# Patient Record
Sex: Female | Born: 1945 | Race: White | Hispanic: No | Smoking: Never smoker
Health system: Southern US, Community
[De-identification: ages and names within clinical notes are randomized; demographics above are authoritative.]

## PROBLEM LIST (undated history)

## (undated) DIAGNOSIS — I471 Supraventricular tachycardia, unspecified: Secondary | ICD-10-CM

## (undated) DIAGNOSIS — E119 Type 2 diabetes mellitus without complications: Secondary | ICD-10-CM

## (undated) DIAGNOSIS — M1990 Unspecified osteoarthritis, unspecified site: Secondary | ICD-10-CM

## (undated) DIAGNOSIS — E039 Hypothyroidism, unspecified: Secondary | ICD-10-CM

## (undated) DIAGNOSIS — K635 Polyp of colon: Secondary | ICD-10-CM

## (undated) DIAGNOSIS — E210 Primary hyperparathyroidism: Secondary | ICD-10-CM

## (undated) DIAGNOSIS — E079 Disorder of thyroid, unspecified: Secondary | ICD-10-CM

## (undated) DIAGNOSIS — M51369 Other intervertebral disc degeneration, lumbar region without mention of lumbar back pain or lower extremity pain: Secondary | ICD-10-CM

## (undated) DIAGNOSIS — M5136 Other intervertebral disc degeneration, lumbar region: Secondary | ICD-10-CM

## (undated) DIAGNOSIS — M5126 Other intervertebral disc displacement, lumbar region: Secondary | ICD-10-CM

## (undated) DIAGNOSIS — E785 Hyperlipidemia, unspecified: Secondary | ICD-10-CM

## (undated) DIAGNOSIS — R351 Nocturia: Secondary | ICD-10-CM

## (undated) DIAGNOSIS — K589 Irritable bowel syndrome without diarrhea: Secondary | ICD-10-CM

## (undated) DIAGNOSIS — T8859XA Other complications of anesthesia, initial encounter: Secondary | ICD-10-CM

## (undated) DIAGNOSIS — N289 Disorder of kidney and ureter, unspecified: Secondary | ICD-10-CM

## (undated) DIAGNOSIS — N209 Urinary calculus, unspecified: Secondary | ICD-10-CM

## (undated) DIAGNOSIS — I10 Essential (primary) hypertension: Secondary | ICD-10-CM

## (undated) DIAGNOSIS — J302 Other seasonal allergic rhinitis: Secondary | ICD-10-CM

## (undated) DIAGNOSIS — R011 Cardiac murmur, unspecified: Secondary | ICD-10-CM

## (undated) DIAGNOSIS — Z8739 Personal history of other diseases of the musculoskeletal system and connective tissue: Secondary | ICD-10-CM

## (undated) HISTORY — DX: Essential (primary) hypertension: I10

## (undated) HISTORY — DX: Irritable bowel syndrome, unspecified: K58.9

## (undated) HISTORY — DX: Unspecified osteoarthritis, unspecified site: M19.90

## (undated) HISTORY — DX: Hyperlipidemia, unspecified: E78.5

## (undated) HISTORY — PX: WISDOM TOOTH EXTRACTION: SHX21

## (undated) HISTORY — DX: Disorder of thyroid, unspecified: E07.9

## (undated) HISTORY — DX: Urinary calculus, unspecified: N20.9

## (undated) SURGERY — CYSTOSCOPY, WITH STENT INSERTION
Anesthesia: Choice | Laterality: Left

---
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
## 1984-09-18 HISTORY — PX: OTHER SURGICAL HISTORY: SHX169

## 1984-09-18 HISTORY — PX: ABDOMINAL HYSTERECTOMY: SHX81

## 1995-06-19 HISTORY — PX: LITHOTRIPSY: SUR834

## 1997-06-18 HISTORY — PX: TUMOR REMOVAL: SHX12

## 1999-01-17 HISTORY — PX: CHOLECYSTECTOMY: SHX55

## 1999-11-18 ENCOUNTER — Encounter: Payer: Self-pay | Admitting: *Deleted

## 1999-11-19 ENCOUNTER — Inpatient Hospital Stay (HOSPITAL_COMMUNITY): Admission: EM | Admit: 1999-11-19 | Discharge: 1999-11-23 | Payer: Self-pay

## 1999-11-19 ENCOUNTER — Encounter: Payer: Self-pay | Admitting: *Deleted

## 2000-09-18 HISTORY — PX: TUMOR REMOVAL: SHX12

## 2004-07-13 ENCOUNTER — Ambulatory Visit (HOSPITAL_COMMUNITY): Admission: RE | Admit: 2004-07-13 | Discharge: 2004-07-13 | Payer: Self-pay | Admitting: Podiatry

## 2004-07-25 ENCOUNTER — Encounter: Admission: RE | Admit: 2004-07-25 | Discharge: 2004-07-25 | Payer: Self-pay | Admitting: Urology

## 2004-07-25 IMAGING — CR DG CHEST 2V
2 series · 2 of 2 positions shown · non-contrast
Comparison: none

CLINICAL DATA: Hypertension.  Renal calculi.  Pre-operative chest. 
 TWO VIEW CHEST: 
 Lungs are clear and the heart and mediastinal structures are normal.

[view not recorded (1 of 2)]
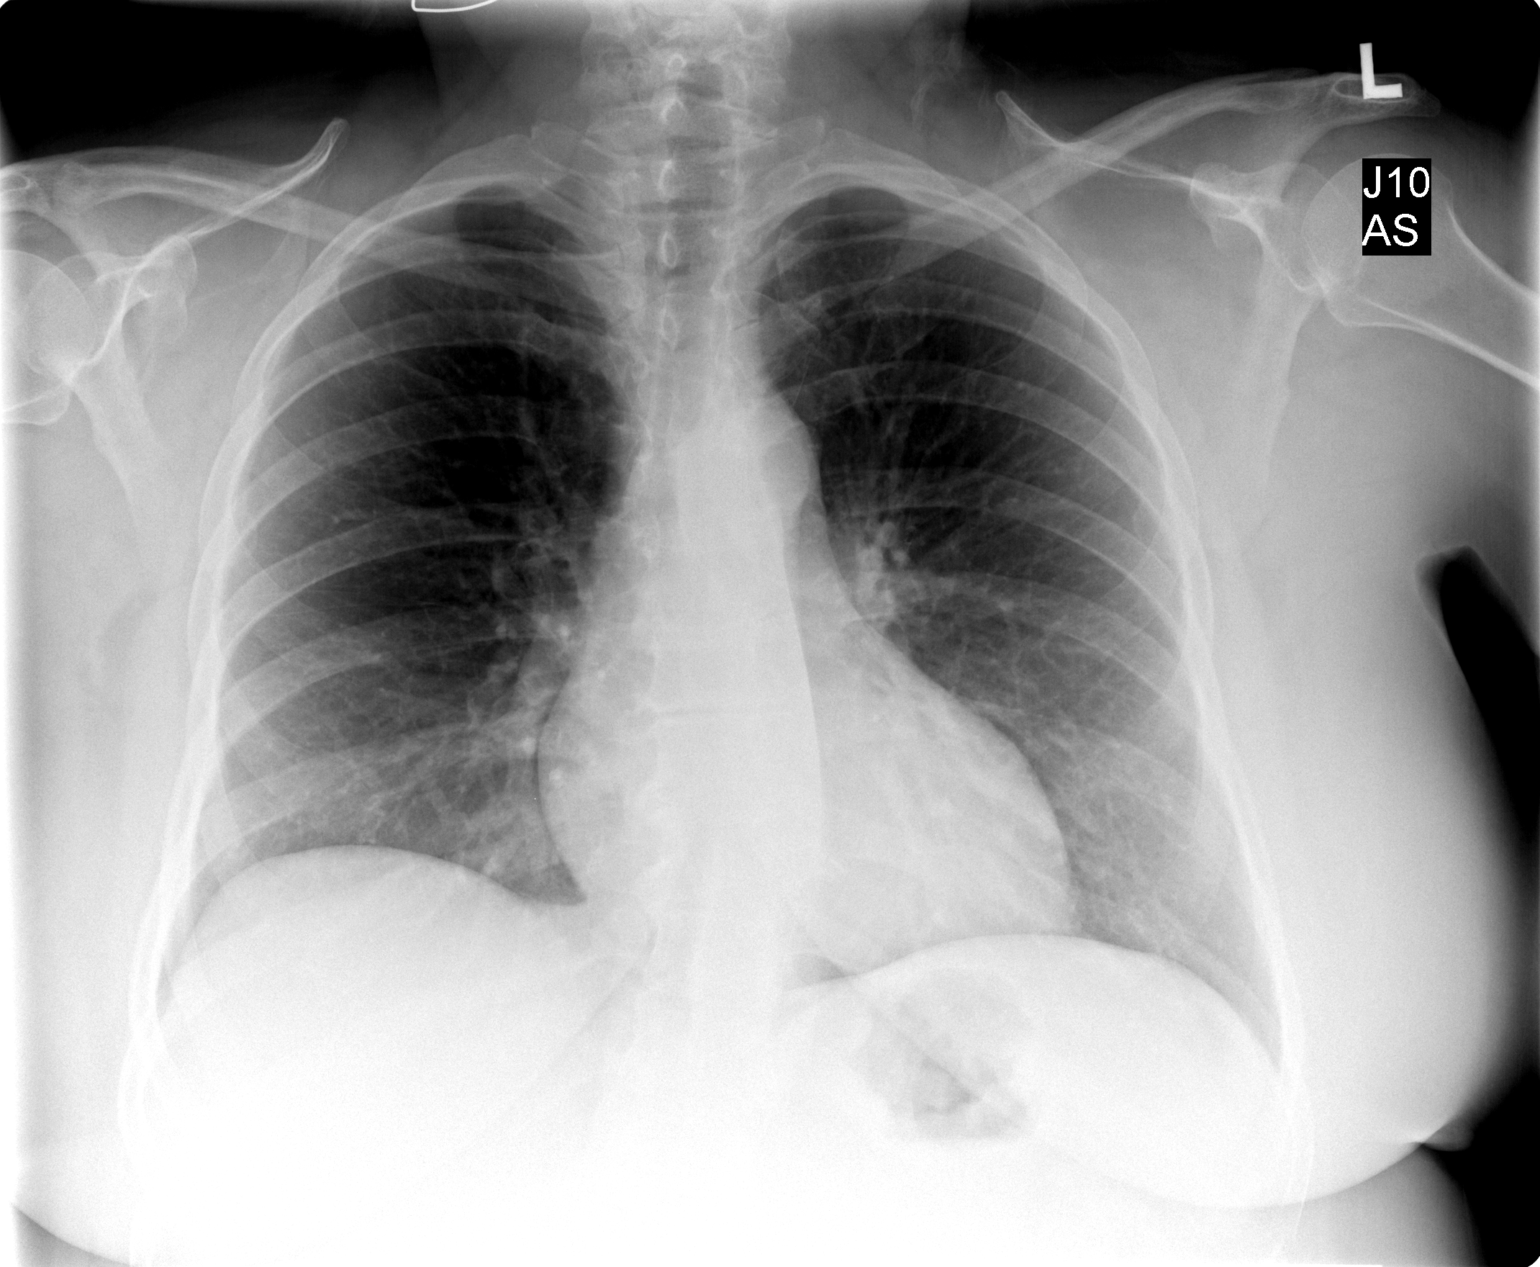

[view not recorded (2 of 2)]
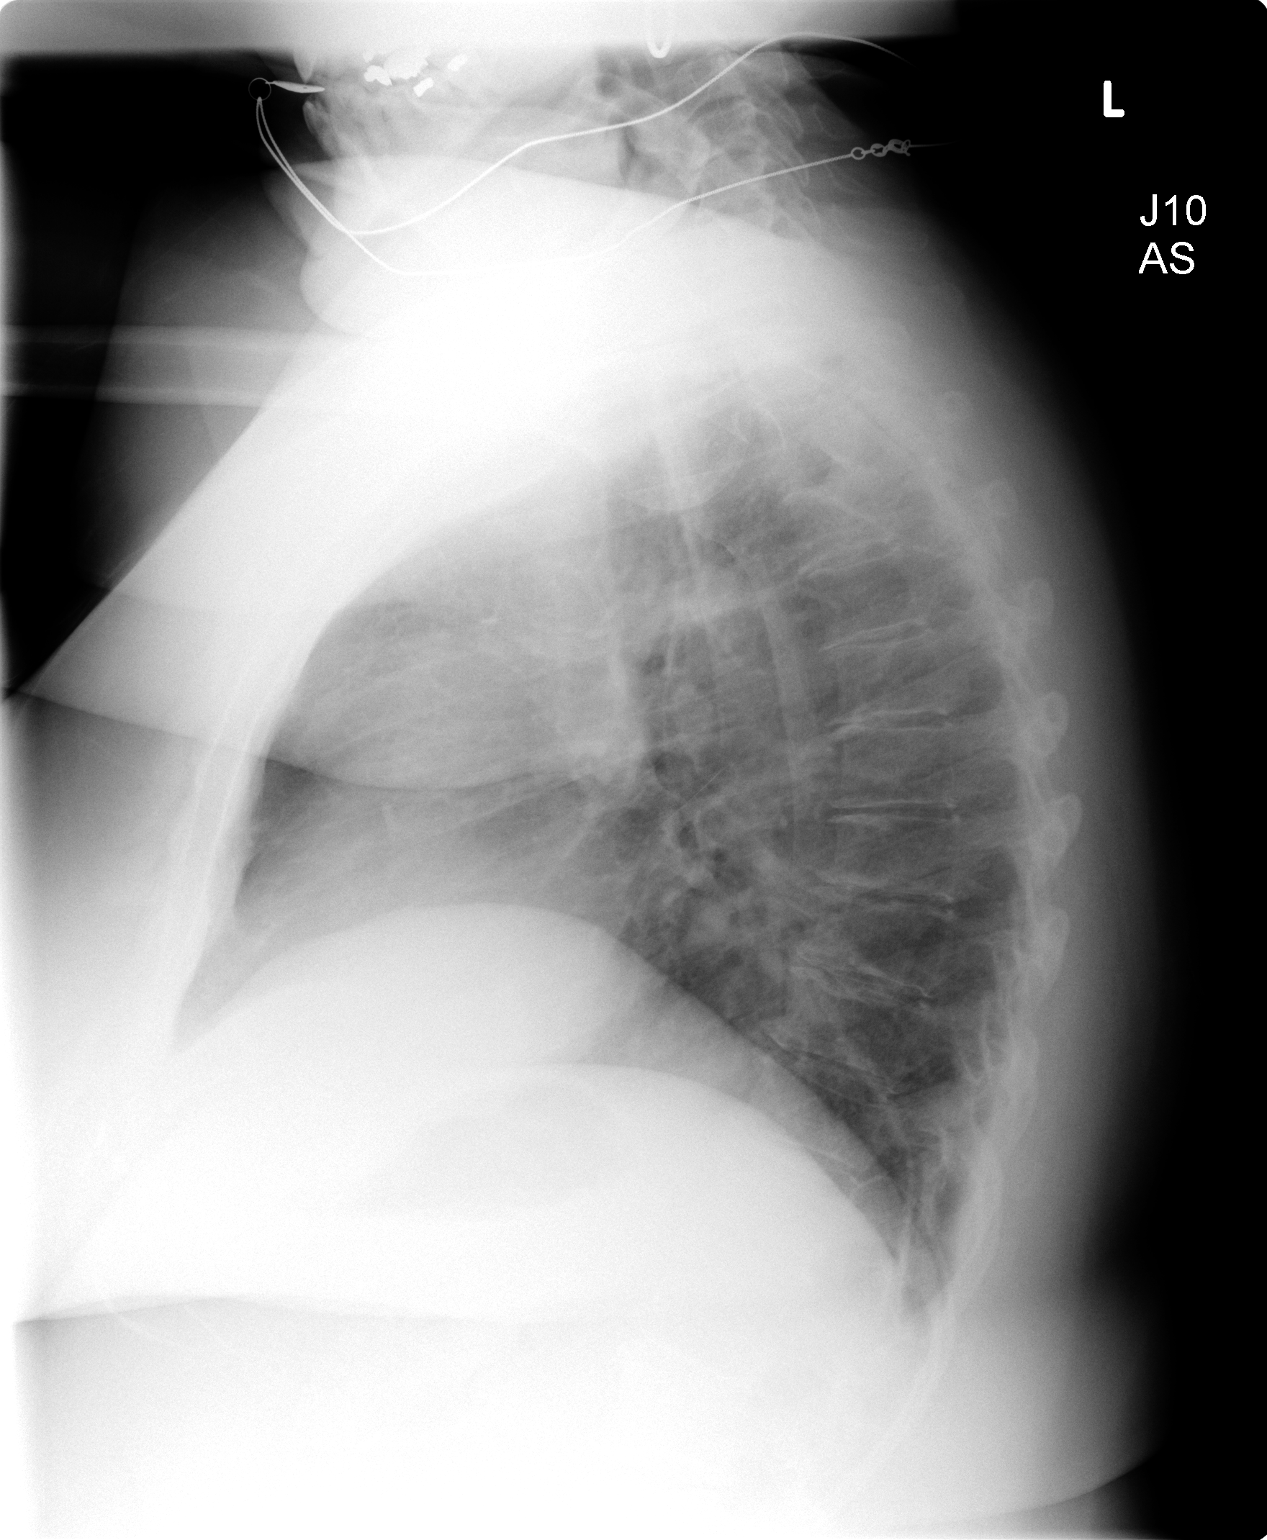

[2 of 2 positions shown; findings below may reference images not displayed]

IMPRESSION: No evidence for active chest disease.

## 2004-07-27 ENCOUNTER — Ambulatory Visit (HOSPITAL_BASED_OUTPATIENT_CLINIC_OR_DEPARTMENT_OTHER): Admission: RE | Admit: 2004-07-27 | Discharge: 2004-07-27 | Payer: Self-pay | Admitting: Urology

## 2004-07-27 ENCOUNTER — Ambulatory Visit (HOSPITAL_COMMUNITY): Admission: RE | Admit: 2004-07-27 | Discharge: 2004-07-27 | Payer: Self-pay | Admitting: Urology

## 2004-07-27 IMAGING — CR DG ABDOMEN 1V
2 series · 2 of 2 positions shown · non-contrast
Comparison: none

CLINICAL DATA: Left renal calculi preop. 
 KUB ? [DATE] at [ST] hours
 At least three calcific densities project over the left renal shadow.  The largest is in the upper pole measuring 9 mm in greater dimension.  Two smaller calcific densities are seen in the lower pole of the left kidney.   Surgical staples project over the right side of the abdomen.  Probable phleboliths project over the pelvis. The bowel gas pattern is unremarkable.  
 IMPRESSION
 Left nephrolithiasis.

[view not recorded (1 of 2)]
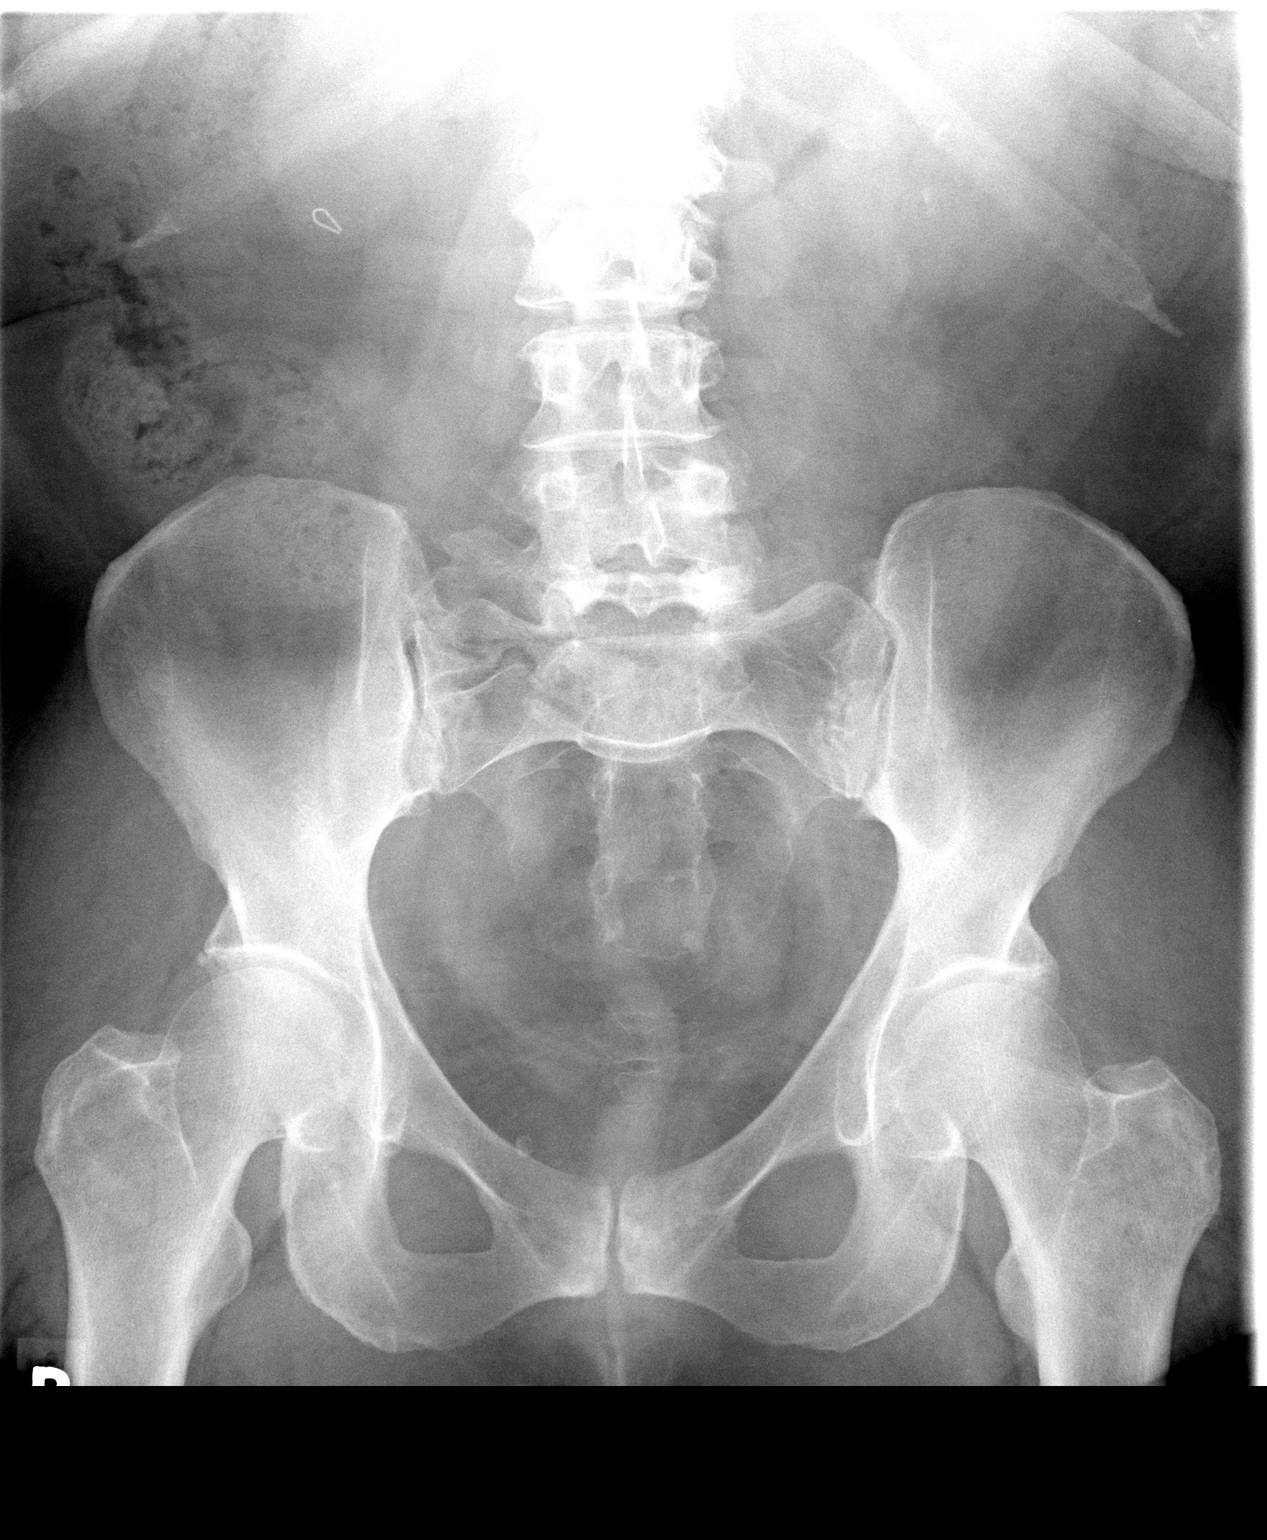

[view not recorded (2 of 2)]
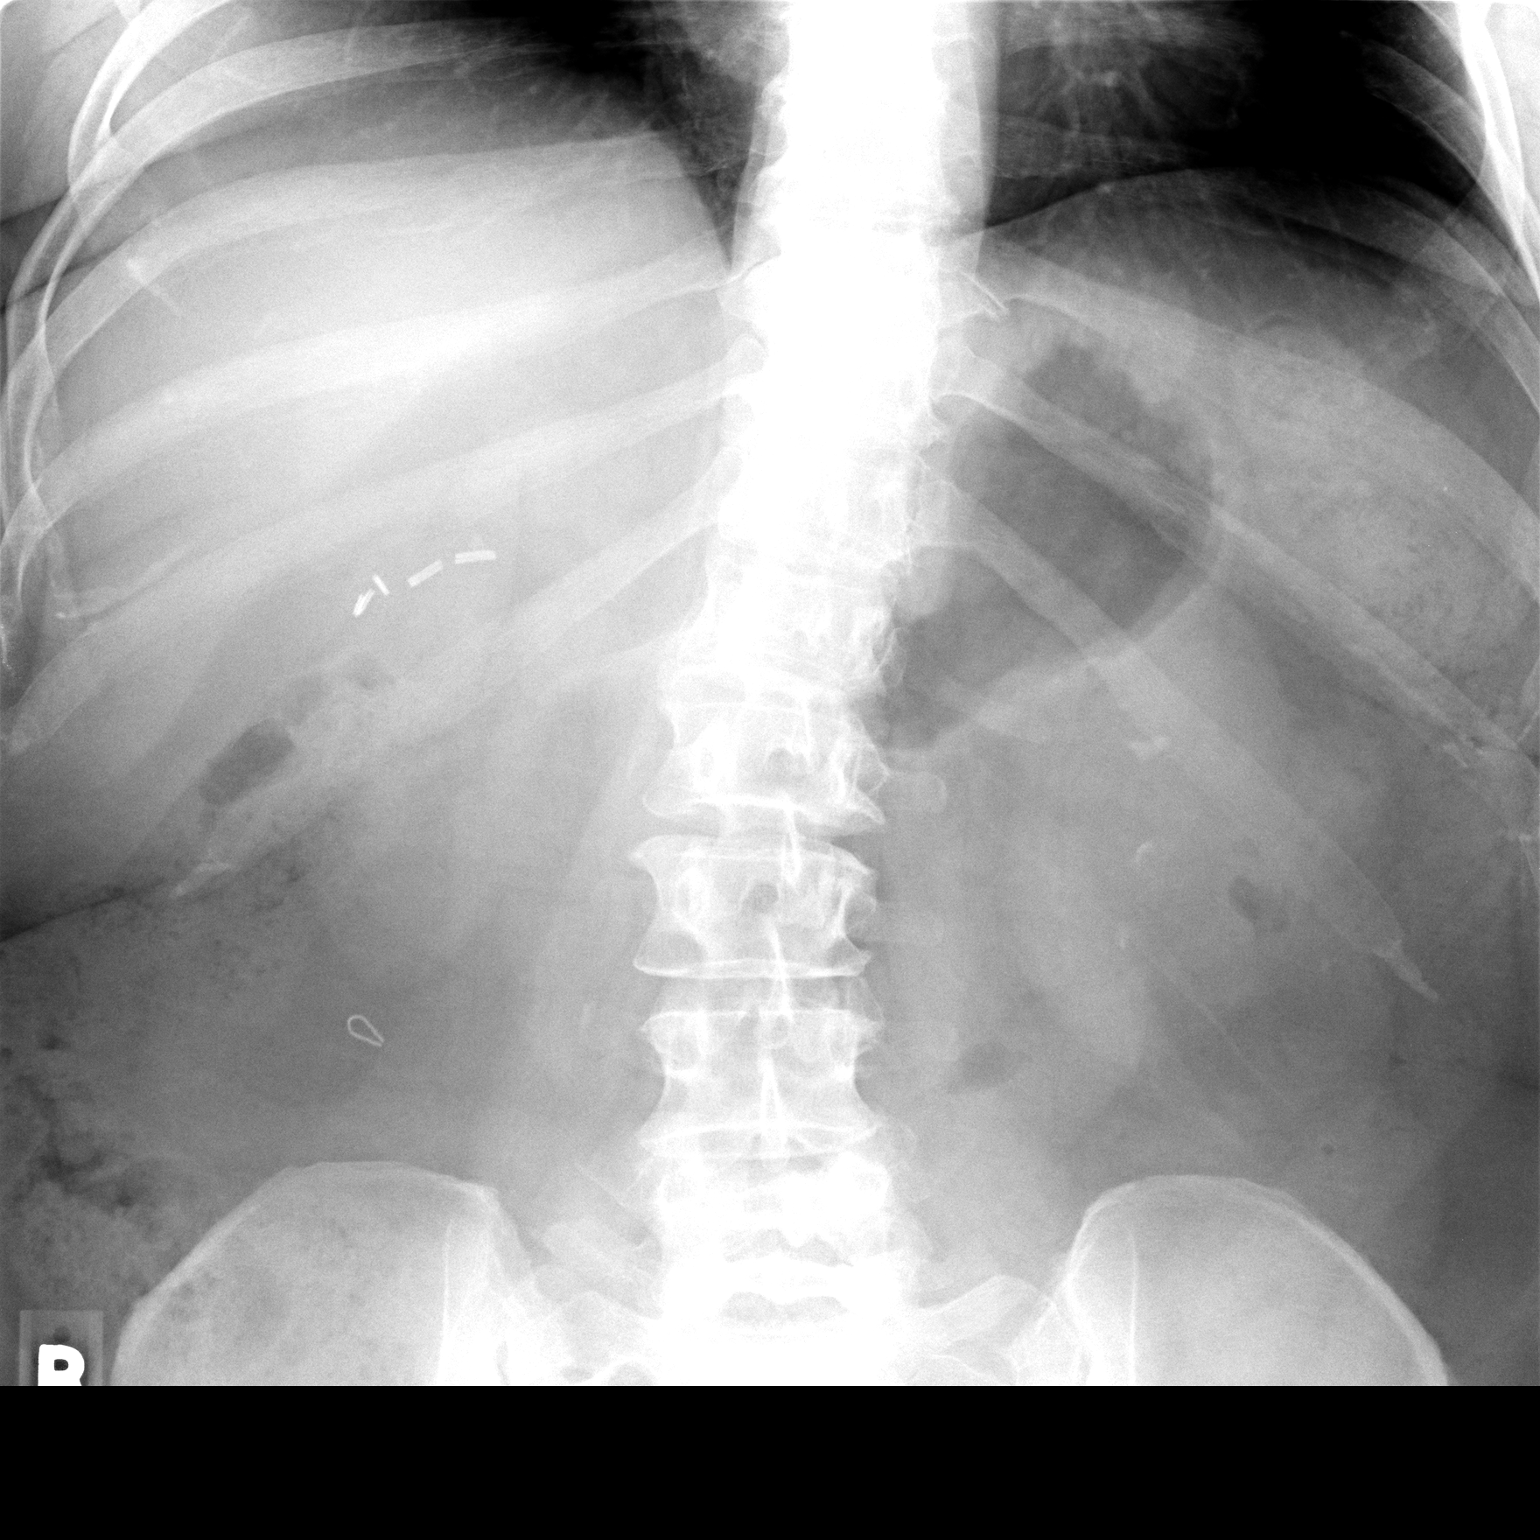

[2 of 2 positions shown; findings below may reference images not displayed]

## 2006-03-16 ENCOUNTER — Ambulatory Visit: Payer: Self-pay | Admitting: Internal Medicine

## 2006-03-30 ENCOUNTER — Ambulatory Visit: Payer: Self-pay | Admitting: Internal Medicine

## 2006-03-30 ENCOUNTER — Ambulatory Visit (HOSPITAL_COMMUNITY): Admission: RE | Admit: 2006-03-30 | Discharge: 2006-03-30 | Payer: Self-pay | Admitting: Internal Medicine

## 2006-06-18 HISTORY — PX: COLONOSCOPY: SHX174

## 2006-12-31 ENCOUNTER — Ambulatory Visit: Payer: Self-pay | Admitting: Cardiology

## 2007-01-01 ENCOUNTER — Observation Stay (HOSPITAL_COMMUNITY): Admission: AD | Admit: 2007-01-01 | Discharge: 2007-01-02 | Payer: Self-pay | Admitting: Cardiology

## 2007-01-01 ENCOUNTER — Ambulatory Visit: Payer: Self-pay | Admitting: Cardiology

## 2007-01-11 ENCOUNTER — Ambulatory Visit: Payer: Self-pay | Admitting: Cardiology

## 2009-09-18 HISTORY — PX: OTHER SURGICAL HISTORY: SHX169

## 2010-10-08 ENCOUNTER — Encounter: Payer: Self-pay | Admitting: Urology

## 2010-12-18 HISTORY — PX: KNEE ARTHROSCOPY: SUR90

## 2011-03-31 ENCOUNTER — Encounter (INDEPENDENT_AMBULATORY_CARE_PROVIDER_SITE_OTHER): Payer: Self-pay | Admitting: Internal Medicine

## 2011-04-25 ENCOUNTER — Telehealth (INDEPENDENT_AMBULATORY_CARE_PROVIDER_SITE_OTHER): Payer: Self-pay | Admitting: *Deleted

## 2011-04-25 DIAGNOSIS — Z86010 Personal history of colonic polyps: Secondary | ICD-10-CM

## 2011-04-25 NOTE — Telephone Encounter (Signed)
TCS sch'd 07/12/11 @ 10:30 (9:30), movi prep ins mailed

## 2011-05-01 MED ORDER — PEG-KCL-NACL-NASULF-NA ASC-C 100 G PO SOLR: 1.0000 | Freq: Once | ORAL | Status: DC

## 2011-06-19 ENCOUNTER — Encounter (INDEPENDENT_AMBULATORY_CARE_PROVIDER_SITE_OTHER): Payer: Self-pay | Admitting: Internal Medicine

## 2011-06-19 ENCOUNTER — Ambulatory Visit (INDEPENDENT_AMBULATORY_CARE_PROVIDER_SITE_OTHER): Payer: BC Managed Care – PPO | Admitting: Internal Medicine

## 2011-06-19 VITALS — BP 128/76 | HR 74 | Temp 97.7°F | Resp 12 | Ht 63.0 in | Wt 227.0 lb

## 2011-06-19 DIAGNOSIS — K589 Irritable bowel syndrome without diarrhea: Secondary | ICD-10-CM

## 2011-06-19 MED ORDER — DICYCLOMINE HCL 10 MG PO CAPS: 10.0000 mg | ORAL_CAPSULE | Freq: Two times a day (BID) | ORAL | Status: DC

## 2011-06-19 NOTE — Patient Instructions (Addendum)
Increase fiber intake. dicylcomine 10 mg twice daily before breakfast and before lunch Colonoscopy as planned

## 2011-06-20 NOTE — Progress Notes (Signed)
Presenting complaint; intermittent diarrhea. History of present illness; patient is 65 year old Caucasian female who presents with complaints of intermittent diarrhea that she states she's been putting off for several years. I do not remember this is an issue when she had her last colonoscopy 5 years ago. She is due for surveillance colonoscopy later this month because of history of polyps;  He states she's had intermittent diarrhea for several years. When she has diarrhea she may have 4-5 stools a day. He could go from having a normal stool in the morning followed by loose stool with urgency is here with severe pain across her lower abdomen relieved with defecation. She she's been having these spells at least 2-3 times a week. Other times her stools are normal. Only time she gets constipated she eats too much cheese. She denies rectal bleeding melena anorexia or weight loss. She's never been treated or diagnosed with irritable bowel syndrome. She denies nocturnal diarrhea. Review of the systems is negative for fever, chills, skin rash or wheezing. Current medications Current outpatient prescriptions:fluticasone (FLONASE) 50 MCG/ACT nasal spray, Place 2 sprays into the nose as needed. , Disp: , Rfl: ;  HYDROcodone-acetaminophen (NORCO) 7.5-325 MG per tablet, Take 1 tablet by mouth. Patient takes as needed for kidney stones, Disp: , Rfl: ;  ibuprofen (ADVIL,MOTRIN) 800 MG tablet, Take 800 mg by mouth as needed.  , Disp: , Rfl:  levothyroxine (SYNTHROID, LEVOTHROID) 50 MCG tablet, Take 50 mcg by mouth daily.  , Disp: , Rfl: ;  losartan-hydrochlorothiazide (HYZAAR) 100-25 MG per tablet, Take 1 tablet by mouth daily.  , Disp: , Rfl: ;  metoprolol (LOPRESSOR) 50 MG tablet, Take 50 mg by mouth daily.  , Disp: , Rfl: ;  dicyclomine (BENTYL) 10 MG capsule, Take 1 capsule (10 mg total) by mouth 2 (two) times daily., Disp: 60 capsule, Rfl: 5 peg 3350 powder (MOVIPREP) 100 G SOLR, Take 1 kit (100 g total) by mouth  once., Disp: 1 kit, Rfl: 0 Past medical history History of colonic adenomas; first colonoscopy was in February 2003 with removal of 2 small tubular adenomas. Her last colonoscopy was in July 2007 with removal of 8 mm tubulovillous adenoma and transverse colon She has  been hypertensive for about 15 years Hypothyroidism was diagnosed 4 years ago Appendectomy 1954 Hysterectomy and tacking of the bladder in 1986 Obesity Cholecystectomy in May 2000 Osteoarthrosis of knees status post left knee arthroscopy in May 2007 Surgery for right rotator cuff tear in October 2011 Allergies NKA  Family history mother lived to be 16. Father died of MI age 69. She has one brother in good health. Social history She is married. She stayed home the taking care of her mother and now assist in the care of her mother-in-law. She has 2 daughters in good health. He is never smoked cigarettes or drank alcohol per Objective BP 128/76  Pulse 74  Temp(Src) 97.7 F (36.5 C) (Oral)  Resp 12  Ht 5\' 3"  (1.6 m)  Wt 227 lb (102.967 kg)  BMI 40.21 kg/m2 Conjunctiva is pink. Sclerae nonicteric. Oropharyngeal mucosa is normal No neck masses or thyromegaly noted. Cardiac exam with regular rhythm normal S1 and S2. No murmur gallop noted Lungs are clear to auscultation Abdomen is full; bowel sounds are normal. On palpation soft abdomen without tenderness organomegaly or masses. Rectal examination deferred. No peripheral edema or clubbing noted Assessment Patient's symptoms are felt to be typical of irritable bowel syndrome. She is due for surveillance colonoscopy because of history  of colonic adenomas; this study would also allow [place] to rule out  low-grade colitis or colonic stricture. In the meantime we'll treat her with low-dose anti-spasmodic Recommendations. Dicyclomine 10 mg before breakfast and 10 mg before lunch. Social side effects of medication reviewed with the patient. Colonoscopy as planned for later this  month.

## 2011-07-04 ENCOUNTER — Other Ambulatory Visit (INDEPENDENT_AMBULATORY_CARE_PROVIDER_SITE_OTHER): Payer: Self-pay | Admitting: *Deleted

## 2011-07-04 DIAGNOSIS — Z86010 Personal history of colonic polyps: Secondary | ICD-10-CM

## 2011-07-11 MED ORDER — SODIUM CHLORIDE 0.45 % IV SOLN
Freq: Once | INTRAVENOUS | Status: AC
  Administered 2011-07-12: 11:00:00 via INTRAVENOUS

## 2011-07-12 ENCOUNTER — Ambulatory Visit (HOSPITAL_COMMUNITY)
Admission: RE | Admit: 2011-07-12 | Discharge: 2011-07-12 | Disposition: A | Discharge status: Home / Self Care | Payer: BC Managed Care – PPO | Source: Ambulatory Visit | Attending: Internal Medicine | Admitting: Internal Medicine

## 2011-07-12 ENCOUNTER — Other Ambulatory Visit (INDEPENDENT_AMBULATORY_CARE_PROVIDER_SITE_OTHER): Payer: Self-pay | Admitting: Internal Medicine

## 2011-07-12 ENCOUNTER — Encounter (HOSPITAL_COMMUNITY)
Admission: RE | Disposition: A | Discharge status: Home / Self Care | Payer: Self-pay | Source: Ambulatory Visit | Attending: Internal Medicine

## 2011-07-12 ENCOUNTER — Encounter (HOSPITAL_COMMUNITY): Payer: Self-pay | Admitting: *Deleted

## 2011-07-12 DIAGNOSIS — K5730 Diverticulosis of large intestine without perforation or abscess without bleeding: Secondary | ICD-10-CM

## 2011-07-12 DIAGNOSIS — D126 Benign neoplasm of colon, unspecified: Secondary | ICD-10-CM | POA: Insufficient documentation

## 2011-07-12 DIAGNOSIS — Z09 Encounter for follow-up examination after completed treatment for conditions other than malignant neoplasm: Secondary | ICD-10-CM | POA: Insufficient documentation

## 2011-07-12 DIAGNOSIS — Z860100 Personal history of colon polyps, unspecified: Secondary | ICD-10-CM | POA: Insufficient documentation

## 2011-07-12 DIAGNOSIS — Z79899 Other long term (current) drug therapy: Secondary | ICD-10-CM | POA: Insufficient documentation

## 2011-07-12 DIAGNOSIS — Z1211 Encounter for screening for malignant neoplasm of colon: Secondary | ICD-10-CM

## 2011-07-12 DIAGNOSIS — I10 Essential (primary) hypertension: Secondary | ICD-10-CM | POA: Insufficient documentation

## 2011-07-12 HISTORY — PX: COLONOSCOPY: SHX5424

## 2011-07-12 HISTORY — DX: Polyp of colon: K63.5

## 2011-07-12 HISTORY — DX: Other seasonal allergic rhinitis: J30.2

## 2011-07-12 HISTORY — DX: Hypothyroidism, unspecified: E03.9

## 2011-07-12 SURGERY — COLONOSCOPY
Anesthesia: Moderate Sedation

## 2011-07-12 MED ORDER — STERILE WATER FOR IRRIGATION IR SOLN
Status: DC | PRN
  Administered 2011-07-12: 11:00:00

## 2011-07-12 MED ORDER — MIDAZOLAM HCL 5 MG/5ML IJ SOLN
INTRAMUSCULAR | Status: AC
  Filled 2011-07-12: qty 10

## 2011-07-12 MED ORDER — MEPERIDINE HCL 50 MG/ML IJ SOLN
INTRAMUSCULAR | Status: DC | PRN
  Administered 2011-07-12 (×2): 25 mg via INTRAVENOUS

## 2011-07-12 MED ORDER — MIDAZOLAM HCL 5 MG/5ML IJ SOLN
INTRAMUSCULAR | Status: DC | PRN
  Administered 2011-07-12: 2 mg via INTRAVENOUS
  Administered 2011-07-12 (×2): 1 mg via INTRAVENOUS
  Administered 2011-07-12: 2 mg via INTRAVENOUS
  Administered 2011-07-12: 1 mg via INTRAVENOUS
  Administered 2011-07-12: 2 mg via INTRAVENOUS
  Administered 2011-07-12: 1 mg via INTRAVENOUS

## 2011-07-12 MED ORDER — MEPERIDINE HCL 50 MG/ML IJ SOLN
INTRAMUSCULAR | Status: AC
  Filled 2011-07-12: qty 1

## 2011-07-12 MED ORDER — SODIUM CHLORIDE 0.9 % IJ SOLN
INTRAMUSCULAR | Status: DC | PRN
  Administered 2011-07-12: 15 mL

## 2011-07-12 NOTE — H&P (Addendum)
This is an update to my history and physical from 06/19/2011. Patient states her diarrhea is better with single dose of dicyclomine 10 mg daily. He is undergoing colonoscopy surveillance purposes cause of history of colonic adenomas

## 2011-07-12 NOTE — Op Note (Signed)
COLONOSCOPY PROCEDURE REPORT  PATIENT:  Natasha Warren  MR#:  [id number] Birthdate:  11-12-1945, 65 y.o., female Endoscopist:  Dr. Rogene Houston, MD Referred By:  Dr. Thomasene Lot. Nadara Mustard, M.D. Procedure Date: 07/12/2011  Procedure:   Colonoscopy with snare polypectomy.  Indications:  Patient is 65 year old female with history of colonic adenomas who is undergoing surveillance colonoscopy. She has intermittent diarrhea felt to be due to IBS and she is feeling better with low-dose dicyclomine.  Informed Consent:  Procedure and risks reviewed with the patient and informed consent was obtained.  Medications:  Demerol 50 mg IV Versed 10 mg IV  Description of procedure:  After a digital rectal exam was performed, that colonoscope was advanced from the anus through the rectum and colon to the area of the cecum, ileocecal valve and appendiceal orifice. The cecum was deeply intubated. These structures were well-seen and photographed for the record. From the level of the cecum and ileocecal valve, the scope was slowly and cautiously withdrawn. The mucosal surfaces were carefully surveyed utilizing scope tip to flexion to facilitate fold flattening as needed. The scope was pulled down into the rectum where a thorough exam including retroflexion was performed.  Findings:   Preparation satisfactory. 12-13 mm sessile polyp at cecum. Saline-assisted polypectomy performed. Polyp snared in 2 pieces; larger piece was retrieved for histologic examination and smaller piece was lost. 2 hemoclips applied to polypectomy site to reduce the risk of post-polypectomy bleed. One clip was lost possibly defective. Another 3 mm polyp ablated via cold biopsy from proximal transverse colon. Few diverticula at sigmoid colon. Mucosa at the rectum and anorectal junction was unremarkable.  Therapeutic/Diagnostic Maneuvers Performed:  See above  Complications:  None  Cecal Withdrawal Time:  20 minutes  Impression:    Examination performed to cecum. 12-13 mm sessile cecal polyp. Saline-assisted polypectomy performed. Polypectomy complete. 2 hemoclips applied to polypectomy site. 3 mm polyp ablated via cold biopsy from proximal transverse colon. Mild sigmoid colon diverticulosis.  Recommendations:  No aspirin for 10 days I will be contacting patient with results of biopsy.  Jayme Cham U  07/12/2011 12:18 PM  CC: Dr. Hilaria Ota, MD & Dr. Rayne Du ref. provider found

## 2011-07-19 ENCOUNTER — Encounter (HOSPITAL_COMMUNITY): Payer: Self-pay | Admitting: Internal Medicine

## 2011-07-21 ENCOUNTER — Encounter (INDEPENDENT_AMBULATORY_CARE_PROVIDER_SITE_OTHER): Payer: Self-pay | Admitting: *Deleted

## 2011-09-26 DIAGNOSIS — I10 Essential (primary) hypertension: Secondary | ICD-10-CM | POA: Diagnosis not present

## 2011-09-26 DIAGNOSIS — E7800 Pure hypercholesterolemia, unspecified: Secondary | ICD-10-CM | POA: Diagnosis not present

## 2011-09-26 DIAGNOSIS — N200 Calculus of kidney: Secondary | ICD-10-CM | POA: Diagnosis not present

## 2011-09-26 DIAGNOSIS — M545 Low back pain: Secondary | ICD-10-CM | POA: Diagnosis not present

## 2011-09-26 DIAGNOSIS — E039 Hypothyroidism, unspecified: Secondary | ICD-10-CM | POA: Diagnosis not present

## 2011-11-04 DIAGNOSIS — J069 Acute upper respiratory infection, unspecified: Secondary | ICD-10-CM | POA: Diagnosis not present

## 2011-11-15 ENCOUNTER — Telehealth (INDEPENDENT_AMBULATORY_CARE_PROVIDER_SITE_OTHER): Payer: Self-pay | Admitting: *Deleted

## 2011-11-15 NOTE — Telephone Encounter (Addendum)
Patient left a message on voicemail. She is requesting a note for jury duty. Colonoscopy was done 06-30-11 , and the patient states that she was told she may have IBS, her concern was if she ever got called to jury duty she would be concerned about being able to go to the restroom. Natasha Warren says that Dr. Laural Golden told her that he would write her a note if that should ever happen,patient was got a letter this week to jury duty. To be addressed by Dr. Laural Golden, then patient contacted.  [name] per Dr. Laural Golden patient may be given a note to be excused from [facility], see his note.

## 2011-11-16 ENCOUNTER — Encounter (INDEPENDENT_AMBULATORY_CARE_PROVIDER_SITE_OTHER): Payer: Self-pay | Admitting: *Deleted

## 2011-11-16 NOTE — Telephone Encounter (Signed)
We can give her a note to be excused from jury duty because of diarrhea with urgency.

## 2011-11-16 NOTE — Telephone Encounter (Signed)
Note has been printed and ready for pick up. Natasha Warren has been called.

## 2011-12-04 DIAGNOSIS — M7989 Other specified soft tissue disorders: Secondary | ICD-10-CM | POA: Diagnosis not present

## 2011-12-04 DIAGNOSIS — M79609 Pain in unspecified limb: Secondary | ICD-10-CM | POA: Diagnosis not present

## 2011-12-04 DIAGNOSIS — M109 Gout, unspecified: Secondary | ICD-10-CM | POA: Diagnosis not present

## 2011-12-04 DIAGNOSIS — M25579 Pain in unspecified ankle and joints of unspecified foot: Secondary | ICD-10-CM | POA: Diagnosis not present

## 2011-12-04 DIAGNOSIS — L539 Erythematous condition, unspecified: Secondary | ICD-10-CM | POA: Diagnosis not present

## 2011-12-11 DIAGNOSIS — M109 Gout, unspecified: Secondary | ICD-10-CM | POA: Diagnosis not present

## 2012-01-08 DIAGNOSIS — Z1231 Encounter for screening mammogram for malignant neoplasm of breast: Secondary | ICD-10-CM | POA: Diagnosis not present

## 2012-01-16 DIAGNOSIS — L280 Lichen simplex chronicus: Secondary | ICD-10-CM | POA: Diagnosis not present

## 2012-01-16 DIAGNOSIS — L570 Actinic keratosis: Secondary | ICD-10-CM | POA: Diagnosis not present

## 2012-01-16 DIAGNOSIS — D485 Neoplasm of uncertain behavior of skin: Secondary | ICD-10-CM | POA: Diagnosis not present

## 2012-02-23 ENCOUNTER — Encounter (INDEPENDENT_AMBULATORY_CARE_PROVIDER_SITE_OTHER): Payer: Self-pay

## 2012-03-26 DIAGNOSIS — E7800 Pure hypercholesterolemia, unspecified: Secondary | ICD-10-CM | POA: Diagnosis not present

## 2012-03-26 DIAGNOSIS — I10 Essential (primary) hypertension: Secondary | ICD-10-CM | POA: Diagnosis not present

## 2012-03-26 DIAGNOSIS — E039 Hypothyroidism, unspecified: Secondary | ICD-10-CM | POA: Diagnosis not present

## 2012-03-29 DIAGNOSIS — J069 Acute upper respiratory infection, unspecified: Secondary | ICD-10-CM | POA: Diagnosis not present

## 2012-04-02 DIAGNOSIS — M545 Low back pain: Secondary | ICD-10-CM | POA: Diagnosis not present

## 2012-04-02 DIAGNOSIS — I10 Essential (primary) hypertension: Secondary | ICD-10-CM | POA: Diagnosis not present

## 2012-04-02 DIAGNOSIS — E7800 Pure hypercholesterolemia, unspecified: Secondary | ICD-10-CM | POA: Diagnosis not present

## 2012-04-02 DIAGNOSIS — E039 Hypothyroidism, unspecified: Secondary | ICD-10-CM | POA: Diagnosis not present

## 2012-04-02 DIAGNOSIS — M109 Gout, unspecified: Secondary | ICD-10-CM | POA: Diagnosis not present

## 2012-05-15 DIAGNOSIS — N200 Calculus of kidney: Secondary | ICD-10-CM | POA: Diagnosis not present

## 2012-05-16 DIAGNOSIS — Z8614 Personal history of Methicillin resistant Staphylococcus aureus infection: Secondary | ICD-10-CM | POA: Diagnosis not present

## 2012-05-20 DIAGNOSIS — L03319 Cellulitis of trunk, unspecified: Secondary | ICD-10-CM | POA: Diagnosis not present

## 2012-05-20 DIAGNOSIS — Z2821 Immunization not carried out because of patient refusal: Secondary | ICD-10-CM | POA: Diagnosis not present

## 2012-05-20 DIAGNOSIS — Z79899 Other long term (current) drug therapy: Secondary | ICD-10-CM | POA: Diagnosis not present

## 2012-05-20 DIAGNOSIS — L0390 Cellulitis, unspecified: Secondary | ICD-10-CM | POA: Diagnosis not present

## 2012-05-20 DIAGNOSIS — M1990 Unspecified osteoarthritis, unspecified site: Secondary | ICD-10-CM | POA: Diagnosis not present

## 2012-05-20 DIAGNOSIS — E785 Hyperlipidemia, unspecified: Secondary | ICD-10-CM | POA: Diagnosis not present

## 2012-05-20 DIAGNOSIS — Z6835 Body mass index (BMI) 35.0-35.9, adult: Secondary | ICD-10-CM | POA: Diagnosis not present

## 2012-05-20 DIAGNOSIS — N200 Calculus of kidney: Secondary | ICD-10-CM | POA: Diagnosis present

## 2012-05-20 DIAGNOSIS — E039 Hypothyroidism, unspecified: Secondary | ICD-10-CM | POA: Diagnosis not present

## 2012-05-20 DIAGNOSIS — I10 Essential (primary) hypertension: Secondary | ICD-10-CM | POA: Diagnosis not present

## 2012-05-20 DIAGNOSIS — L0291 Cutaneous abscess, unspecified: Secondary | ICD-10-CM | POA: Diagnosis not present

## 2012-05-20 DIAGNOSIS — L02219 Cutaneous abscess of trunk, unspecified: Secondary | ICD-10-CM | POA: Diagnosis not present

## 2012-05-20 DIAGNOSIS — A4902 Methicillin resistant Staphylococcus aureus infection, unspecified site: Secondary | ICD-10-CM | POA: Diagnosis not present

## 2012-05-20 DIAGNOSIS — E669 Obesity, unspecified: Secondary | ICD-10-CM | POA: Diagnosis not present

## 2012-05-23 DIAGNOSIS — L02219 Cutaneous abscess of trunk, unspecified: Secondary | ICD-10-CM | POA: Diagnosis not present

## 2012-05-23 DIAGNOSIS — L03319 Cellulitis of trunk, unspecified: Secondary | ICD-10-CM | POA: Diagnosis not present

## 2012-05-23 DIAGNOSIS — I10 Essential (primary) hypertension: Secondary | ICD-10-CM | POA: Diagnosis not present

## 2012-05-24 DIAGNOSIS — L02219 Cutaneous abscess of trunk, unspecified: Secondary | ICD-10-CM | POA: Diagnosis not present

## 2012-05-24 DIAGNOSIS — I10 Essential (primary) hypertension: Secondary | ICD-10-CM | POA: Diagnosis not present

## 2012-05-25 DIAGNOSIS — L02219 Cutaneous abscess of trunk, unspecified: Secondary | ICD-10-CM | POA: Diagnosis not present

## 2012-05-25 DIAGNOSIS — I10 Essential (primary) hypertension: Secondary | ICD-10-CM | POA: Diagnosis not present

## 2012-05-25 DIAGNOSIS — L03319 Cellulitis of trunk, unspecified: Secondary | ICD-10-CM | POA: Diagnosis not present

## 2012-05-26 DIAGNOSIS — L03319 Cellulitis of trunk, unspecified: Secondary | ICD-10-CM | POA: Diagnosis not present

## 2012-05-26 DIAGNOSIS — I10 Essential (primary) hypertension: Secondary | ICD-10-CM | POA: Diagnosis not present

## 2012-05-27 DIAGNOSIS — L02219 Cutaneous abscess of trunk, unspecified: Secondary | ICD-10-CM | POA: Diagnosis not present

## 2012-05-27 DIAGNOSIS — I10 Essential (primary) hypertension: Secondary | ICD-10-CM | POA: Diagnosis not present

## 2012-05-28 DIAGNOSIS — L03319 Cellulitis of trunk, unspecified: Secondary | ICD-10-CM | POA: Diagnosis not present

## 2012-05-28 DIAGNOSIS — I10 Essential (primary) hypertension: Secondary | ICD-10-CM | POA: Diagnosis not present

## 2012-05-29 DIAGNOSIS — L03319 Cellulitis of trunk, unspecified: Secondary | ICD-10-CM | POA: Diagnosis not present

## 2012-05-29 DIAGNOSIS — I10 Essential (primary) hypertension: Secondary | ICD-10-CM | POA: Diagnosis not present

## 2012-05-31 DIAGNOSIS — I10 Essential (primary) hypertension: Secondary | ICD-10-CM | POA: Diagnosis not present

## 2012-05-31 DIAGNOSIS — L02219 Cutaneous abscess of trunk, unspecified: Secondary | ICD-10-CM | POA: Diagnosis not present

## 2012-06-02 DIAGNOSIS — L02219 Cutaneous abscess of trunk, unspecified: Secondary | ICD-10-CM | POA: Diagnosis not present

## 2012-06-02 DIAGNOSIS — I10 Essential (primary) hypertension: Secondary | ICD-10-CM | POA: Diagnosis not present

## 2012-06-03 DIAGNOSIS — L03319 Cellulitis of trunk, unspecified: Secondary | ICD-10-CM | POA: Diagnosis not present

## 2012-06-03 DIAGNOSIS — I10 Essential (primary) hypertension: Secondary | ICD-10-CM | POA: Diagnosis not present

## 2012-06-04 DIAGNOSIS — I10 Essential (primary) hypertension: Secondary | ICD-10-CM | POA: Diagnosis not present

## 2012-06-04 DIAGNOSIS — Z23 Encounter for immunization: Secondary | ICD-10-CM | POA: Diagnosis not present

## 2012-06-04 DIAGNOSIS — L03319 Cellulitis of trunk, unspecified: Secondary | ICD-10-CM | POA: Diagnosis not present

## 2012-06-05 DIAGNOSIS — I10 Essential (primary) hypertension: Secondary | ICD-10-CM | POA: Diagnosis not present

## 2012-06-05 DIAGNOSIS — L03319 Cellulitis of trunk, unspecified: Secondary | ICD-10-CM | POA: Diagnosis not present

## 2012-06-06 DIAGNOSIS — L02219 Cutaneous abscess of trunk, unspecified: Secondary | ICD-10-CM | POA: Diagnosis not present

## 2012-06-06 DIAGNOSIS — I10 Essential (primary) hypertension: Secondary | ICD-10-CM | POA: Diagnosis not present

## 2012-06-07 DIAGNOSIS — L02219 Cutaneous abscess of trunk, unspecified: Secondary | ICD-10-CM | POA: Diagnosis not present

## 2012-06-07 DIAGNOSIS — I10 Essential (primary) hypertension: Secondary | ICD-10-CM | POA: Diagnosis not present

## 2012-06-08 DIAGNOSIS — L03319 Cellulitis of trunk, unspecified: Secondary | ICD-10-CM | POA: Diagnosis not present

## 2012-06-08 DIAGNOSIS — I10 Essential (primary) hypertension: Secondary | ICD-10-CM | POA: Diagnosis not present

## 2012-06-09 DIAGNOSIS — I10 Essential (primary) hypertension: Secondary | ICD-10-CM | POA: Diagnosis not present

## 2012-06-09 DIAGNOSIS — L03319 Cellulitis of trunk, unspecified: Secondary | ICD-10-CM | POA: Diagnosis not present

## 2012-06-10 DIAGNOSIS — I10 Essential (primary) hypertension: Secondary | ICD-10-CM | POA: Diagnosis not present

## 2012-06-10 DIAGNOSIS — L03319 Cellulitis of trunk, unspecified: Secondary | ICD-10-CM | POA: Diagnosis not present

## 2012-06-11 DIAGNOSIS — I10 Essential (primary) hypertension: Secondary | ICD-10-CM | POA: Diagnosis not present

## 2012-06-11 DIAGNOSIS — L02219 Cutaneous abscess of trunk, unspecified: Secondary | ICD-10-CM | POA: Diagnosis not present

## 2012-06-12 DIAGNOSIS — L02219 Cutaneous abscess of trunk, unspecified: Secondary | ICD-10-CM | POA: Diagnosis not present

## 2012-06-12 DIAGNOSIS — I10 Essential (primary) hypertension: Secondary | ICD-10-CM | POA: Diagnosis not present

## 2012-06-13 DIAGNOSIS — T148XXA Other injury of unspecified body region, initial encounter: Secondary | ICD-10-CM | POA: Diagnosis not present

## 2012-06-17 DIAGNOSIS — L03319 Cellulitis of trunk, unspecified: Secondary | ICD-10-CM | POA: Diagnosis not present

## 2012-06-17 DIAGNOSIS — I10 Essential (primary) hypertension: Secondary | ICD-10-CM | POA: Diagnosis not present

## 2012-06-27 DIAGNOSIS — N200 Calculus of kidney: Secondary | ICD-10-CM | POA: Diagnosis not present

## 2012-07-03 DIAGNOSIS — N289 Disorder of kidney and ureter, unspecified: Secondary | ICD-10-CM | POA: Diagnosis not present

## 2012-07-03 DIAGNOSIS — M549 Dorsalgia, unspecified: Secondary | ICD-10-CM | POA: Diagnosis not present

## 2012-07-03 DIAGNOSIS — N200 Calculus of kidney: Secondary | ICD-10-CM | POA: Diagnosis not present

## 2012-10-02 DIAGNOSIS — J45901 Unspecified asthma with (acute) exacerbation: Secondary | ICD-10-CM | POA: Diagnosis not present

## 2012-10-02 DIAGNOSIS — J111 Influenza due to unidentified influenza virus with other respiratory manifestations: Secondary | ICD-10-CM | POA: Diagnosis not present

## 2012-12-26 DIAGNOSIS — N12 Tubulo-interstitial nephritis, not specified as acute or chronic: Secondary | ICD-10-CM | POA: Diagnosis not present

## 2012-12-26 DIAGNOSIS — M25439 Effusion, unspecified wrist: Secondary | ICD-10-CM | POA: Diagnosis not present

## 2012-12-26 DIAGNOSIS — R32 Unspecified urinary incontinence: Secondary | ICD-10-CM | POA: Diagnosis not present

## 2012-12-26 DIAGNOSIS — M25539 Pain in unspecified wrist: Secondary | ICD-10-CM | POA: Diagnosis not present

## 2012-12-26 DIAGNOSIS — R509 Fever, unspecified: Secondary | ICD-10-CM | POA: Diagnosis not present

## 2012-12-26 DIAGNOSIS — M109 Gout, unspecified: Secondary | ICD-10-CM | POA: Diagnosis not present

## 2013-01-09 DIAGNOSIS — Z1231 Encounter for screening mammogram for malignant neoplasm of breast: Secondary | ICD-10-CM | POA: Diagnosis not present

## 2013-01-21 DIAGNOSIS — D485 Neoplasm of uncertain behavior of skin: Secondary | ICD-10-CM | POA: Diagnosis not present

## 2013-01-23 DIAGNOSIS — G44209 Tension-type headache, unspecified, not intractable: Secondary | ICD-10-CM | POA: Diagnosis not present

## 2013-02-04 ENCOUNTER — Ambulatory Visit (INDEPENDENT_AMBULATORY_CARE_PROVIDER_SITE_OTHER): Payer: Medicare Other | Admitting: Urology

## 2013-02-04 ENCOUNTER — Ambulatory Visit (HOSPITAL_COMMUNITY)
Admission: RE | Admit: 2013-02-04 | Discharge: 2013-02-04 | Disposition: A | Discharge status: Home / Self Care | Payer: Medicare Other | Source: Ambulatory Visit | Attending: Urology | Admitting: Urology

## 2013-02-04 ENCOUNTER — Other Ambulatory Visit: Payer: Self-pay | Admitting: Urology

## 2013-02-04 DIAGNOSIS — N200 Calculus of kidney: Secondary | ICD-10-CM | POA: Insufficient documentation

## 2013-02-04 DIAGNOSIS — N12 Tubulo-interstitial nephritis, not specified as acute or chronic: Secondary | ICD-10-CM | POA: Diagnosis not present

## 2013-02-04 DIAGNOSIS — N179 Acute kidney failure, unspecified: Secondary | ICD-10-CM

## 2013-02-04 IMAGING — CR DG ABDOMEN 1V
1 series · 1 of 1 positions shown · non-contrast
Comparison: [DATE]

CLINICAL DATA: Left renal calculus

ABDOMEN - 1 VIEW

[view not recorded]
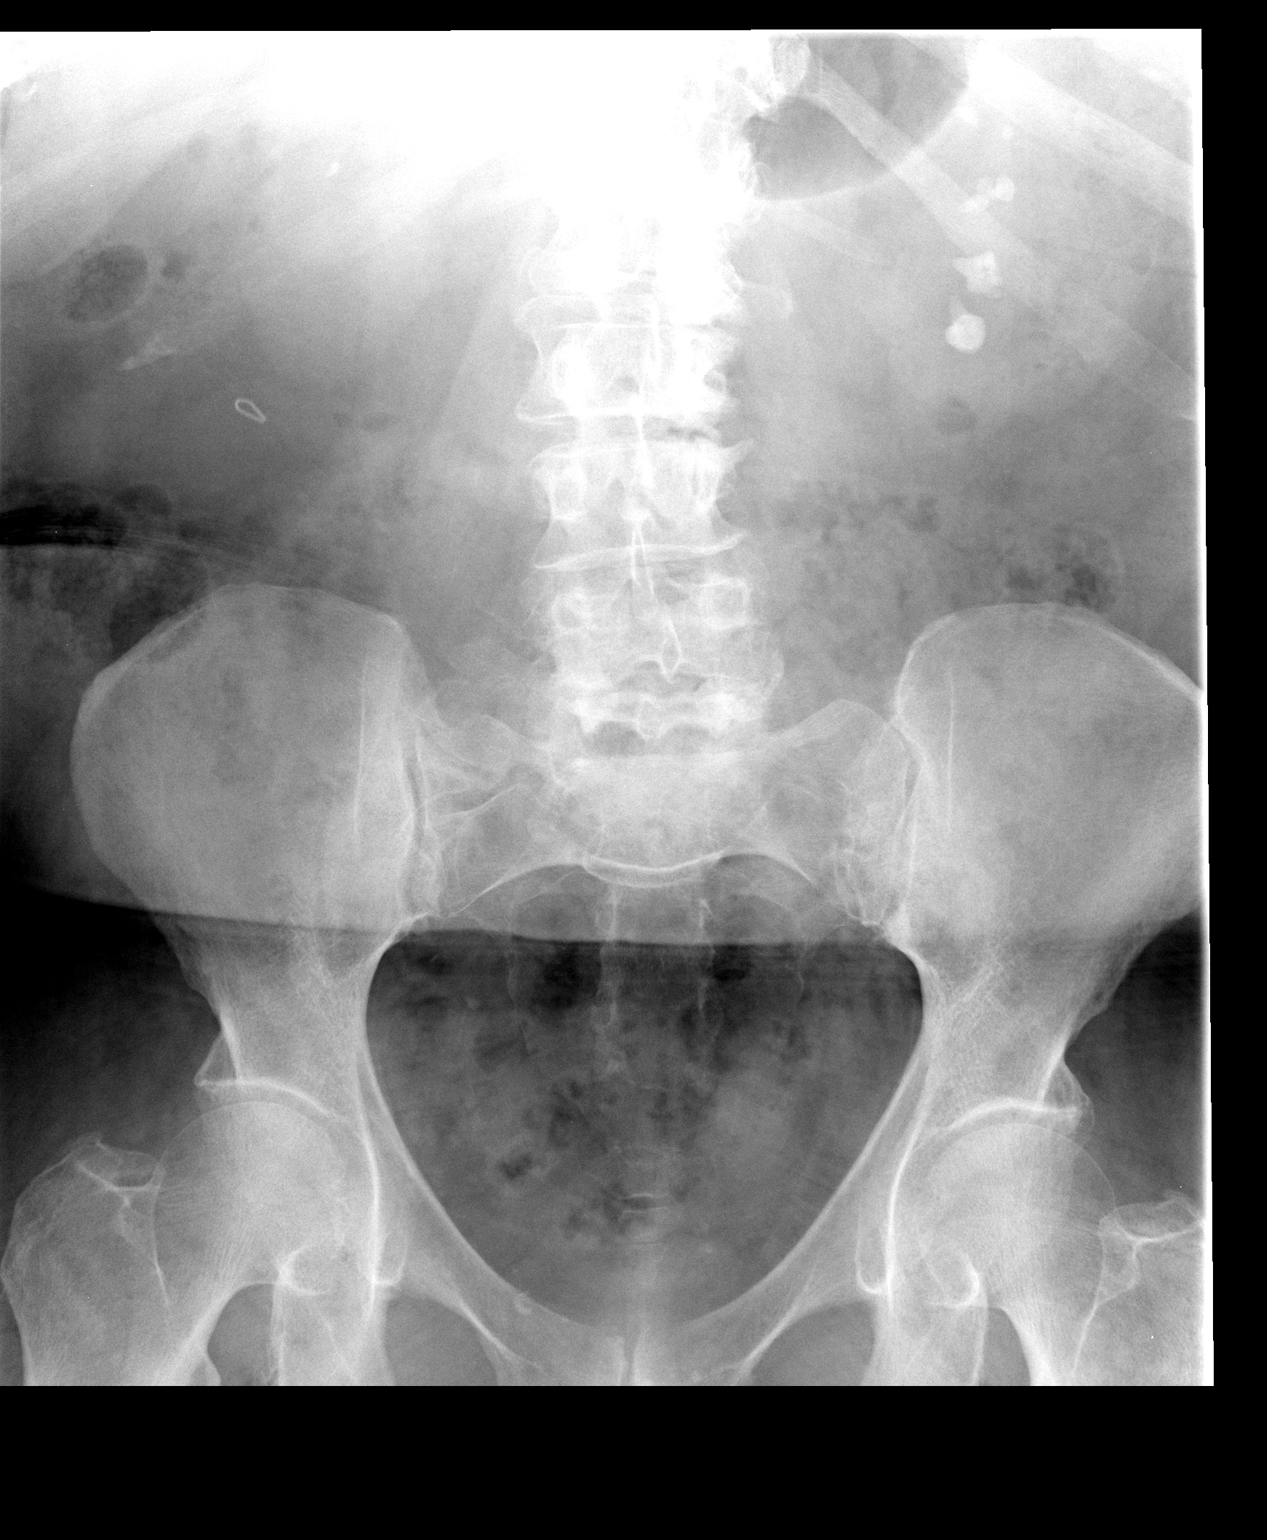

[1 of 1 positions shown; findings below may reference images not displayed]

FINDINGS: Bilateral renal calculi present.
Tiny right renal calculi measuring 4 mm and 5 mm in size.
Multiple left renal calculi including two calculi that each measure
17 x 12 mm in size at the lower pole.
No definite ureteral or vesicular calculi.
Bones appear demineralized with dextroconvex scoliosis and
degenerative disc/facet disease changes.
Normal bowel gas pattern.
Surgical clips right upper quadrant right mid abdomen.
IMPRESSION: Multiple bilateral renal calculi greater on the left as above.

## 2013-03-10 ENCOUNTER — Other Ambulatory Visit (INDEPENDENT_AMBULATORY_CARE_PROVIDER_SITE_OTHER): Payer: Self-pay | Admitting: General Surgery

## 2013-03-10 ENCOUNTER — Encounter (INDEPENDENT_AMBULATORY_CARE_PROVIDER_SITE_OTHER): Payer: Self-pay | Admitting: General Surgery

## 2013-03-10 ENCOUNTER — Other Ambulatory Visit (INDEPENDENT_AMBULATORY_CARE_PROVIDER_SITE_OTHER): Payer: Self-pay

## 2013-03-10 ENCOUNTER — Ambulatory Visit (INDEPENDENT_AMBULATORY_CARE_PROVIDER_SITE_OTHER): Payer: Medicare Other | Admitting: General Surgery

## 2013-03-10 VITALS — BP 120/74 | HR 68 | Temp 97.3°F | Resp 16 | Ht 63.0 in | Wt 228.0 lb

## 2013-03-10 DIAGNOSIS — N200 Calculus of kidney: Secondary | ICD-10-CM

## 2013-03-10 DIAGNOSIS — E213 Hyperparathyroidism, unspecified: Secondary | ICD-10-CM | POA: Diagnosis not present

## 2013-03-10 NOTE — Patient Instructions (Signed)
We will call you with the results of your tests.

## 2013-03-10 NOTE — Progress Notes (Signed)
Patient ID: Natasha Warren, female   DOB: 1946-04-28, 67 y.o.   MRN: [id number]  Chief Complaint  Patient presents with  . New Evaluation    eval hyperparathyroidism    HPI Natasha Warren is a 67 y.o. female.   HPI  She is referred by Dr. Diona Fanti for evaluation of hyperparathyroidism.  She has a long history of kidney stones. Recent calcium has been elevated at 11.0 and 10.8. Intact parathyroid hormone level was elevated at 94.4 with normal being 14-72. No dysphagia. No voice changes.   Past Medical History  Diagnosis Date  . Hypertension   . Thyroid disorder   . Mouth lesion   . Irritable bowel syndrome   . Seasonal allergies   . Hypothyroidism   . Colon polyps   . Arthritis   . Hyperlipidemia   . UTI (lower urinary tract infection)   . Urolithiasis     Past Surgical History  Procedure Laterality Date  . Partial hysterectomy  09/1984  . Bladder tack  09/1984  . Lithotripsy  06/1995  . 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 Rotor cuff  2011    Right Shoulder  . Knee arthroscopy  12/2010    Left knee  . Colonoscopy  06/2006    This was her second one  . Appendectomy  1954  . Cholecystectomy  01/1999  . Colonoscopy  07/12/2011    Procedure: COLONOSCOPY;  Surgeon: Rogene Houston, MD;  Location: [facility];  Service: Endoscopy;  Laterality: N/A;  10:30 am  . Abdominal hysterectomy      Family History  Problem Relation Age of Onset  . Hypertension Mother   . Arthritis Mother   . Hypertension Father   . Healthy Brother   . Healthy Daughter   . Healthy Daughter   . Colon cancer Neg Hx     Social History History  Substance Use Topics  . Smoking status: Never Smoker   . Smokeless tobacco: Never Used  . Alcohol Use: Yes     Comment: Very Rarely 1/2 glass of beer    Allergies  Allergen Reactions  . Ace Inhibitors     Patient doesn't remember this allergy.     Current Outpatient  Prescriptions  Medication Sig Dispense Refill  . acetaminophen (TYLENOL) 500 MG tablet Take 1,000 mg by mouth every 6 (six) hours as needed for pain.      Marland Kitchen colchicine (COLCRYS) 0.6 MG tablet Take 0.6 mg by mouth daily.      Marland Kitchen dicyclomine (BENTYL) 10 MG capsule Take 10 mg by mouth daily.        Marland Kitchen levothyroxine (SYNTHROID, LEVOTHROID) 50 MCG tablet Take 50 mcg by mouth daily.        Marland Kitchen losartan-hydrochlorothiazide (HYZAAR) 100-25 MG per tablet Take 1 tablet by mouth daily.        . metoprolol (LOPRESSOR) 50 MG tablet Take 50 mg by mouth daily.         No current facility-administered medications for this visit.    Review of Systems Review of Systems  Respiratory: Negative.   Cardiovascular: Negative.   Gastrointestinal: Negative.   Genitourinary: Negative for hematuria and difficulty urinating.  Musculoskeletal: Positive for arthralgias.    Blood pressure 120/74, pulse 68, temperature 97.3 F (36.3 C), temperature source Temporal, resp. rate 16, height 5\' 3"  (1.6 m), weight 228 lb (103.42 kg).  Physical Exam Physical Exam  Constitutional:  Obese female in NAD.  HENT:  Head: Normocephalic and atraumatic.  Mouth/Throat: No oropharyngeal exudate.  Eyes: EOM are normal. No scleral icterus.  Neck: Neck supple. No tracheal deviation present. No thyromegaly present.  No palpable masses.  Cardiovascular: Normal rate and regular rhythm.   Lymphadenopathy:    She has no cervical adenopathy.    Data Reviewed Notes from Dr. Diona Fanti  Assessment    Elevated parathyroid hormone level hypercalcemia in a patient with nephrolithiasis. This is highly suspicious for hyperparathyroidism. Other potential causes could be metabolic in nature other than hyperparathyroidism.     Plan    Obtain 24-hour urine for calcium level. Check vitamin D level. If the results are still highly suggestive of hyperparathyroidism we'll order a sestamibi scan.        Santanna Olenik J 03/10/2013, 2:49  PM

## 2013-03-11 ENCOUNTER — Other Ambulatory Visit (INDEPENDENT_AMBULATORY_CARE_PROVIDER_SITE_OTHER): Payer: Self-pay

## 2013-03-11 DIAGNOSIS — E213 Hyperparathyroidism, unspecified: Secondary | ICD-10-CM | POA: Diagnosis not present

## 2013-03-12 ENCOUNTER — Other Ambulatory Visit (INDEPENDENT_AMBULATORY_CARE_PROVIDER_SITE_OTHER): Payer: Self-pay | Admitting: General Surgery

## 2013-03-12 LAB — VITAMIN D 25 HYDROXY (VIT D DEFICIENCY, FRACTURES): Vit D, 25-Hydroxy: 15 ng/mL — ABNORMAL LOW (ref 30–89)

## 2013-03-13 ENCOUNTER — Other Ambulatory Visit (INDEPENDENT_AMBULATORY_CARE_PROVIDER_SITE_OTHER): Payer: Self-pay | Admitting: General Surgery

## 2013-03-13 ENCOUNTER — Encounter (INDEPENDENT_AMBULATORY_CARE_PROVIDER_SITE_OTHER): Payer: Self-pay

## 2013-03-13 DIAGNOSIS — E213 Hyperparathyroidism, unspecified: Secondary | ICD-10-CM | POA: Diagnosis not present

## 2013-03-17 ENCOUNTER — Other Ambulatory Visit (INDEPENDENT_AMBULATORY_CARE_PROVIDER_SITE_OTHER): Payer: Self-pay | Admitting: General Surgery

## 2013-03-17 ENCOUNTER — Encounter (INDEPENDENT_AMBULATORY_CARE_PROVIDER_SITE_OTHER): Payer: Self-pay

## 2013-03-17 ENCOUNTER — Telehealth (INDEPENDENT_AMBULATORY_CARE_PROVIDER_SITE_OTHER): Payer: Self-pay

## 2013-03-17 DIAGNOSIS — E213 Hyperparathyroidism, unspecified: Secondary | ICD-10-CM

## 2013-03-17 NOTE — Telephone Encounter (Signed)
Rx for Vitamin D 50,000 u po 1 wk x 8 wks w/ no refill called in to pharmacy.  Pt is aware.

## 2013-03-18 ENCOUNTER — Encounter (INDEPENDENT_AMBULATORY_CARE_PROVIDER_SITE_OTHER): Payer: Self-pay | Admitting: General Surgery

## 2013-03-18 NOTE — Progress Notes (Unsigned)
Patient ID: Natasha Warren, female   DOB: 1946-01-10, 67 y.o.   MRN: [id number] Spoke with her husband and gave him the results of her tests as she was not available.  Vitamin D level was low and supplement has been prescribed for her.  24 hour urine calcium level was normal.  Will proceed with Sestamibi scan.

## 2013-03-20 ENCOUNTER — Telehealth (INDEPENDENT_AMBULATORY_CARE_PROVIDER_SITE_OTHER): Payer: Self-pay | Admitting: General Surgery

## 2013-03-20 NOTE — Telephone Encounter (Signed)
Left message for patient to call the office for appt day and time at  [facility], 03/31/13 at [facility]

## 2013-03-24 DIAGNOSIS — I10 Essential (primary) hypertension: Secondary | ICD-10-CM | POA: Diagnosis not present

## 2013-03-24 DIAGNOSIS — E039 Hypothyroidism, unspecified: Secondary | ICD-10-CM | POA: Diagnosis not present

## 2013-03-24 DIAGNOSIS — E538 Deficiency of other specified B group vitamins: Secondary | ICD-10-CM | POA: Diagnosis not present

## 2013-03-24 DIAGNOSIS — E7800 Pure hypercholesterolemia, unspecified: Secondary | ICD-10-CM | POA: Diagnosis not present

## 2013-03-31 ENCOUNTER — Encounter (HOSPITAL_COMMUNITY)
Admission: RE | Admit: 2013-03-31 | Discharge: 2013-03-31 | Disposition: A | Discharge status: Home / Self Care | Payer: Medicare Other | Source: Ambulatory Visit | Attending: Surgery | Admitting: Surgery

## 2013-03-31 ENCOUNTER — Encounter (HOSPITAL_COMMUNITY): Payer: Self-pay

## 2013-03-31 DIAGNOSIS — E213 Hyperparathyroidism, unspecified: Secondary | ICD-10-CM | POA: Insufficient documentation

## 2013-03-31 IMAGING — NM NM PARATHYROID W/ SPECT
4 series · 24 of 24 positions shown · non-contrast
Comparison: None

CLINICAL DATA: Hyperparathyroidism.  PTH equal 94.4 with

NUCLEAR MEDICINE PARATHYROID WITH SPECT
Radiopharmaceutical: 24.6 mCi technetium sestamibi

[Series 1: pa parathyroid · 4.7mm · 4.75mm/px · 6 of 91 frames shown (1 of 4)]
[frame 8/91]
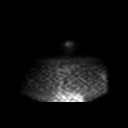
[frame 23/91]
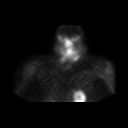
[frame 38/91]
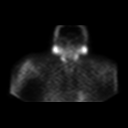
[frame 53/91]
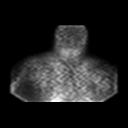
[frame 68/91]
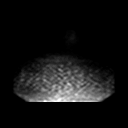
[frame 84/91]
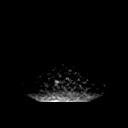

[Series 1: pa parathyroid · 4.7mm · 4.75mm/px · 6 of 91 frames shown (2 of 4)]
[frame 8/91]
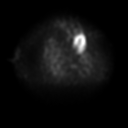
[frame 23/91]
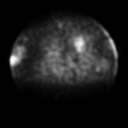
[frame 38/91]
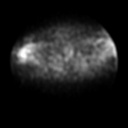
[frame 53/91]
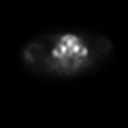
[frame 68/91]
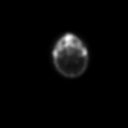
[frame 84/91]
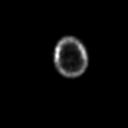

[Series 1: pa parathyroid · 4.7mm · 4.75mm/px · 6 of 91 frames shown (3 of 4)]
[frame 8/91]
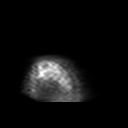
[frame 23/91]
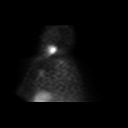
[frame 38/91]
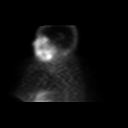
[frame 53/91]
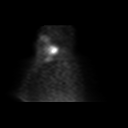
[frame 68/91]
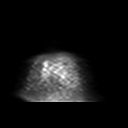
[frame 84/91]
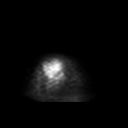

[Series 1: pa parathyroid · 4.75mm/px · 6 of 128 frames shown (4 of 4)]
[frame 11/128  full-range]
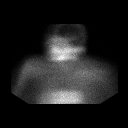
[frame 32/128  full-range]
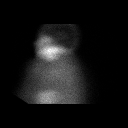
[frame 54/128  full-range]
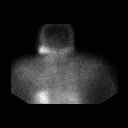
[frame 75/128  full-range]
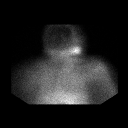
[frame 96/128  full-range]
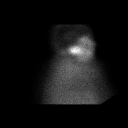
[frame 118/128  full-range]
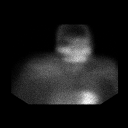

[24 of 24 positions shown; findings below may reference images not displayed]

FINDINGS: Immediate planar images demonstrate uptake within the
thyroid gland.  Activity washes out over the one hour and two hour
images with no focal residual activity to localize parathyroid
adenoma.  SPECT images fail to localize activity in the thyroid bed
or chest.
IMPRESSION: No focal activity within the neck or chest to localize parathyroid
adenoma.

## 2013-03-31 MED ORDER — TECHNETIUM TC 99M SESTAMIBI - CARDIOLITE
24.6000 | Freq: Once | INTRAVENOUS | Status: AC | PRN
  Administered 2013-03-31: 09:00:00 24.6 via INTRAVENOUS

## 2013-04-03 ENCOUNTER — Encounter (INDEPENDENT_AMBULATORY_CARE_PROVIDER_SITE_OTHER): Payer: Self-pay | Admitting: General Surgery

## 2013-04-03 ENCOUNTER — Other Ambulatory Visit (INDEPENDENT_AMBULATORY_CARE_PROVIDER_SITE_OTHER): Payer: Self-pay | Admitting: General Surgery

## 2013-04-03 DIAGNOSIS — E559 Vitamin D deficiency, unspecified: Secondary | ICD-10-CM

## 2013-04-03 DIAGNOSIS — E213 Hyperparathyroidism, unspecified: Secondary | ICD-10-CM

## 2013-04-03 NOTE — Progress Notes (Unsigned)
Patient ID: Natasha Warren, female   DOB: 11/26/1945, 67 y.o.   MRN: [id number] Her sestamibi scan does not show any enlarged parathyroid glands and is essentially normal. Her vitamin D level was 15.  Her 24-hour urine calcium value was 162.  I think she needs to be evaluated by the endocrinologist prior to going to neck exploration with a negative sestamibi scan. We'll thus refer her to Dr. Delrae Rend.

## 2013-04-07 ENCOUNTER — Telehealth (INDEPENDENT_AMBULATORY_CARE_PROVIDER_SITE_OTHER): Payer: Self-pay

## 2013-04-07 NOTE — Telephone Encounter (Signed)
Office notes, labs, imaging and demographics faxed to [facility] [phone] (20 pages sent, fax confirmation rec'd and attached to outgoing fax)

## 2013-04-07 NOTE — Telephone Encounter (Signed)
Called patient with appointment date & time for [facility] w/Dr. Buddy Duty for 05/30/13 @ 10:00 am with arrival time at 9:30 am.  Patient given address, phone number and directions.

## 2013-04-08 ENCOUNTER — Encounter (INDEPENDENT_AMBULATORY_CARE_PROVIDER_SITE_OTHER): Payer: Self-pay

## 2013-04-23 DIAGNOSIS — H2510 Age-related nuclear cataract, unspecified eye: Secondary | ICD-10-CM | POA: Diagnosis not present

## 2013-05-07 ENCOUNTER — Other Ambulatory Visit (INDEPENDENT_AMBULATORY_CARE_PROVIDER_SITE_OTHER): Payer: Self-pay

## 2013-05-07 ENCOUNTER — Telehealth (INDEPENDENT_AMBULATORY_CARE_PROVIDER_SITE_OTHER): Payer: Self-pay

## 2013-05-07 NOTE — Telephone Encounter (Signed)
Pt is requesting whether she needs a refill of her Vitamin D prescribed by Dr. Zella Richer.  She has one left.  Her new patient appt with Dr. Buddy Duty is not until next month.  She was inquiring whether she should continue taking the medicine or wait until her appt with Dr. Buddy Duty.  I told her I would check with Dr. Zella Richer and let her know.

## 2013-05-07 NOTE — Telephone Encounter (Signed)
Refill of Vitamin D3 50,000 units #4, take 1 q week, called to [facility].  Pt made aware.

## 2013-05-07 NOTE — Telephone Encounter (Signed)
Please refill it.

## 2013-05-12 DIAGNOSIS — G44209 Tension-type headache, unspecified, not intractable: Secondary | ICD-10-CM | POA: Diagnosis not present

## 2013-05-12 DIAGNOSIS — E039 Hypothyroidism, unspecified: Secondary | ICD-10-CM | POA: Diagnosis not present

## 2013-05-12 DIAGNOSIS — E7800 Pure hypercholesterolemia, unspecified: Secondary | ICD-10-CM | POA: Diagnosis not present

## 2013-05-16 DIAGNOSIS — R51 Headache: Secondary | ICD-10-CM | POA: Diagnosis not present

## 2013-05-16 DIAGNOSIS — I6789 Other cerebrovascular disease: Secondary | ICD-10-CM | POA: Diagnosis not present

## 2013-05-30 ENCOUNTER — Other Ambulatory Visit (HOSPITAL_COMMUNITY): Payer: Self-pay | Admitting: Internal Medicine

## 2013-05-30 DIAGNOSIS — IMO0002 Reserved for concepts with insufficient information to code with codable children: Secondary | ICD-10-CM

## 2013-05-30 DIAGNOSIS — Z87442 Personal history of urinary calculi: Secondary | ICD-10-CM | POA: Diagnosis not present

## 2013-05-30 DIAGNOSIS — E559 Vitamin D deficiency, unspecified: Secondary | ICD-10-CM | POA: Diagnosis not present

## 2013-05-30 DIAGNOSIS — E210 Primary hyperparathyroidism: Secondary | ICD-10-CM | POA: Diagnosis not present

## 2013-06-05 ENCOUNTER — Ambulatory Visit (HOSPITAL_COMMUNITY)
Admission: RE | Admit: 2013-06-05 | Discharge: 2013-06-05 | Disposition: A | Discharge status: Home / Self Care | Payer: Medicare Other | Source: Ambulatory Visit | Attending: Internal Medicine | Admitting: Internal Medicine

## 2013-06-05 DIAGNOSIS — M899 Disorder of bone, unspecified: Secondary | ICD-10-CM | POA: Diagnosis not present

## 2013-06-05 DIAGNOSIS — IMO0002 Reserved for concepts with insufficient information to code with codable children: Secondary | ICD-10-CM

## 2013-06-10 DIAGNOSIS — E210 Primary hyperparathyroidism: Secondary | ICD-10-CM | POA: Diagnosis not present

## 2013-06-10 DIAGNOSIS — E559 Vitamin D deficiency, unspecified: Secondary | ICD-10-CM | POA: Diagnosis not present

## 2013-06-17 ENCOUNTER — Encounter (INDEPENDENT_AMBULATORY_CARE_PROVIDER_SITE_OTHER): Payer: Self-pay

## 2013-06-18 DIAGNOSIS — E559 Vitamin D deficiency, unspecified: Secondary | ICD-10-CM | POA: Diagnosis not present

## 2013-06-18 DIAGNOSIS — E210 Primary hyperparathyroidism: Secondary | ICD-10-CM | POA: Diagnosis not present

## 2013-06-18 DIAGNOSIS — Z87442 Personal history of urinary calculi: Secondary | ICD-10-CM | POA: Diagnosis not present

## 2013-07-16 ENCOUNTER — Encounter (INDEPENDENT_AMBULATORY_CARE_PROVIDER_SITE_OTHER): Payer: Self-pay | Admitting: General Surgery

## 2013-07-16 ENCOUNTER — Ambulatory Visit (INDEPENDENT_AMBULATORY_CARE_PROVIDER_SITE_OTHER): Payer: Medicare Other | Admitting: General Surgery

## 2013-07-16 VITALS — BP 118/80 | HR 68 | Temp 98.4°F | Resp 15 | Ht 60.5 in | Wt 229.2 lb

## 2013-07-16 DIAGNOSIS — E210 Primary hyperparathyroidism: Secondary | ICD-10-CM | POA: Diagnosis not present

## 2013-07-16 NOTE — Patient Instructions (Signed)
[facility]      [facility] °[phone] ° °THYROID/ PARATHYROID SURGERY: POST OP INSTRUCTIONS ° °Always review your discharge instruction sheet given to you by the facility where your surgery was performed. ° °IF YOU HAVE DISABILITY OR FAMILY LEAVE FORMS, YOU MUST BRING THEM TO THE OFFICE FOR PROCESSING.  PLEASE DO NOT GIVE THEM TO YOUR DOCTOR. ° °1. A prescription for pain medication may be given to you upon discharge.  Take your pain medication as prescribed, if needed.  If narcotic pain medicine is not needed, then you may take acetaminophen (Tylenol) or ibuprofen (Advil) as needed. °2. Take your usually prescribed medications unless otherwise directed. °3. If you need a refill on your pain medication, please contact your pharmacy. They will contact our office to request authorization.  Prescriptions will not be filled after 5pm or on week-ends. °4. You should follow a light diet the first 24 hours after arrival home, such as soup and crackers, etc.  Be sure to include lots of fluids daily.  Resume your normal diet the day after surgery. °5. Most patients will experience some swelling and bruising on the chest and neck area.  Ice packs will help.  Swelling and bruising can take several days to resolve.  °6. It is common to experience some constipation if taking pain medication after surgery.  Increasing fluid intake and taking a stool softener will usually help or prevent this problem from occurring.  A mild laxative (Milk of Magnesia or Miralax) should be taken according to package directions if there are no bowel movements after 48 hours. °7. Unless discharge instructions indicate otherwise, you may remove your bandages 24-48 hours after surgery, and you may shower at that time.  You may have steri-strips (small skin tapes) in place directly over the incision.  These strips should be left on the skin for 7-10 days.  If your surgeon used skin glue on the incision, you may shower in 24 hours.  The  glue will flake off over the next 2-3 weeks.  Any sutures or staples will be removed at the office during your follow-up visit. °8. ACTIVITIES:  You may resume regular (light) daily activities beginning the next day--such as daily self-care, walking, climbing stairs--gradually increasing activities as tolerated.  You may have sexual intercourse when it is comfortable.  Refrain from any heavy lifting or straining until approved by your doctor. °a. You may drive when you no longer are taking prescription pain medication, you can comfortably wear a seatbelt, and you can safely maneuver your car and apply brakes °b. RETURN TO WORK:  __________________________________________________________ °9. You should see your doctor in the office for a follow-up appointment approximately two weeks after your surgery.  Make sure that you call for this appointment within a day or two after you arrive home to insure a convenient appointment time. °10. OTHER INSTRUCTIONS: ____________________________________________________________________________ _________________________________________________________________________________________________________________ °_________________________________________________________________________________________________________________ ° ° °WHEN TO CALL YOUR DOCTOR: °1. Fever over 101.0 °2. Inability to urinate °3. Nausea and/or vomiting °4. Extreme swelling or bruising °5. Continued bleeding from incision. °6. Increased pain, redness, or drainage from the incision. °7. Difficulty swallowing or breathing °8. Muscle cramping or spasms. °9. Numbness or tingling in hands or feet or around lips. ° °The clinic staff is available to answer your questions during regular business hours.  Please don’t hesitate to call and ask to speak to one of the nurses if you have concerns. ° °For further questions, please visit [email or web address] °

## 2013-07-16 NOTE — Progress Notes (Signed)
Patient ID: Natasha Warren, female   DOB: 03-24-1946, 67 y.o.   MRN: [id number]  Chief Complaint  Patient presents with  . Follow-up    LTFU recheck parathyroid    HPI Natasha Warren is a 67 y.o. female.   HPI  She has seen Dr. Buddy Warren and he is evaluated her. His evaluation is consistent with primary hyperparathyroidism. She is here to discuss parathyroidectomy. The other option is medical treatment with bisphosphonate therapy.  She is interested in the surgical option.  Past Medical History  Diagnosis Date  . Hypertension   . Thyroid disorder   . Mouth lesion   . Irritable bowel syndrome   . Seasonal allergies   . Hypothyroidism   . Colon polyps   . Arthritis   . Hyperlipidemia   . UTI (lower urinary tract infection)   . Urolithiasis     Past Surgical History  Procedure Laterality Date  . Partial hysterectomy  09/1984  . Bladder tack  09/1984  . Lithotripsy  06/1995  . 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 Rotor cuff  2011    Right Shoulder  . Knee arthroscopy  12/2010    Left knee  . Colonoscopy  06/2006    This was her second one  . Appendectomy  1954  . Cholecystectomy  01/1999  . Colonoscopy  07/12/2011    Procedure: COLONOSCOPY;  Surgeon: Natasha Houston, MD;  Location: [facility];  Service: Endoscopy;  Laterality: N/A;  10:30 am  . Abdominal hysterectomy      Family History  Problem Relation Age of Onset  . Hypertension Mother   . Arthritis Mother   . Hypertension Father   . Healthy Brother   . Healthy Daughter   . Healthy Daughter   . Colon cancer Neg Hx     Social History History  Substance Use Topics  . Smoking status: Never Smoker   . Smokeless tobacco: Never Used  . Alcohol Use: Yes     Comment: Very Rarely 1/2 glass of beer    Allergies  Allergen Reactions  . Ace Inhibitors     Patient doesn't remember this allergy.     Current Outpatient Prescriptions   Medication Sig Dispense Refill  . acetaminophen (TYLENOL) 500 MG tablet Take 1,000 mg by mouth every 6 (six) hours as needed for pain.      Marland Kitchen colchicine (COLCRYS) 0.6 MG tablet Take 0.6 mg by mouth daily.      Marland Kitchen levothyroxine (SYNTHROID, LEVOTHROID) 50 MCG tablet Take 50 mcg by mouth daily.        Marland Kitchen losartan-hydrochlorothiazide (HYZAAR) 100-25 MG per tablet Take 1 tablet by mouth daily.        . metoprolol (LOPRESSOR) 50 MG tablet Take 50 mg by mouth daily.         No current facility-administered medications for this visit.    Review of Systems Review of Systems  Constitutional: Negative.   Respiratory: Negative.   Cardiovascular: Negative.     Blood pressure 118/80, pulse 68, temperature 98.4 F (36.9 C), temperature source Temporal, resp. rate 15, height 5' 0.5" (1.537 m), weight 229 lb 3.2 oz (103.964 kg).  Physical Exam Physical Exam  Constitutional: No distress.  Obese female  HENT:  Head: Normocephalic and atraumatic.  Eyes: No scleral icterus.  Neck: Neck supple.  No thyroid enlargement or palpable neck masses  Cardiovascular: Normal rate and regular rhythm.   Pulmonary/Chest: Effort normal and breath sounds normal.  Abdominal: Soft. She exhibits no mass. There is no tenderness.  Musculoskeletal: She exhibits no edema.  Lymphadenopathy:    She has no cervical adenopathy.    Data Reviewed My old node. Note from Dr. Buddy Warren.  Assessment    Primary hyperparathyroidism. Parathyroid adenoma did not localize on sestamibi scan.     Plan    Neck exploration and parathyroidectomy. We discussed the procedure, risks, and aftercare. Risks include but are not limited to bleeding, infection, wound problems, anesthesia, inability to find the gland, recurrent laryngeal nerve damage, permanent hoarseness,need for tracheostomy, recurrence.  She seems to understand and would like to proceed.       Natasha Warren J 07/16/2013, 12:46 PM

## 2013-07-23 ENCOUNTER — Encounter (HOSPITAL_COMMUNITY): Payer: Self-pay | Admitting: Pharmacy Technician

## 2013-07-24 ENCOUNTER — Other Ambulatory Visit (HOSPITAL_COMMUNITY): Payer: Self-pay | Admitting: *Deleted

## 2013-07-28 ENCOUNTER — Encounter (HOSPITAL_COMMUNITY)
Admission: RE | Admit: 2013-07-28 | Discharge: 2013-07-28 | Disposition: A | Discharge status: Home / Self Care | Payer: Medicare Other | Source: Ambulatory Visit | Attending: General Surgery | Admitting: General Surgery

## 2013-07-28 ENCOUNTER — Encounter (HOSPITAL_COMMUNITY): Payer: Self-pay

## 2013-07-28 ENCOUNTER — Ambulatory Visit (HOSPITAL_COMMUNITY)
Admission: RE | Admit: 2013-07-28 | Discharge: 2013-07-28 | Disposition: A | Discharge status: Home / Self Care | Payer: Medicare Other | Source: Ambulatory Visit | Attending: General Surgery | Admitting: General Surgery

## 2013-07-28 DIAGNOSIS — E210 Primary hyperparathyroidism: Secondary | ICD-10-CM | POA: Insufficient documentation

## 2013-07-28 DIAGNOSIS — Z01818 Encounter for other preprocedural examination: Secondary | ICD-10-CM | POA: Diagnosis not present

## 2013-07-28 DIAGNOSIS — I10 Essential (primary) hypertension: Secondary | ICD-10-CM | POA: Insufficient documentation

## 2013-07-28 DIAGNOSIS — Z01812 Encounter for preprocedural laboratory examination: Secondary | ICD-10-CM | POA: Insufficient documentation

## 2013-07-28 DIAGNOSIS — Z01810 Encounter for preprocedural cardiovascular examination: Secondary | ICD-10-CM | POA: Diagnosis not present

## 2013-07-28 HISTORY — DX: Nocturia: R35.1

## 2013-07-28 HISTORY — DX: Personal history of other diseases of the musculoskeletal system and connective tissue: Z87.39

## 2013-07-28 HISTORY — DX: Primary hyperparathyroidism: E21.0

## 2013-07-28 LAB — CBC WITH DIFFERENTIAL/PLATELET
Basophils Absolute: 0 10*3/uL (ref 0.0–0.1)
Eosinophils Absolute: 0.1 10*3/uL (ref 0.0–0.7)
Eosinophils Relative: 2 % (ref 0–5)
HCT: 43.5 % (ref 36.0–46.0)
Lymphocytes Relative: 40 % (ref 12–46)
MCH: 29.7 pg (ref 26.0–34.0)
MCHC: 32.9 g/dL (ref 30.0–36.0)
MCV: 90.4 fL (ref 78.0–100.0)
Monocytes Absolute: 0.6 10*3/uL (ref 0.1–1.0)
RDW: 14 % (ref 11.5–15.5)

## 2013-07-28 LAB — COMPREHENSIVE METABOLIC PANEL
AST: 15 U/L (ref 0–37)
CO2: 25 mEq/L (ref 19–32)
Calcium: 11.5 mg/dL — ABNORMAL HIGH (ref 8.4–10.5)
Creatinine, Ser: 1.09 mg/dL (ref 0.50–1.10)
GFR calc Af Amer: 60 mL/min — ABNORMAL LOW (ref >90)
GFR calc non Af Amer: 52 mL/min — ABNORMAL LOW (ref >90)
Total Protein: 7.6 g/dL (ref 6.0–8.3)

## 2013-07-28 LAB — PROTIME-INR: Prothrombin Time: 13 seconds (ref 11.6–15.2)

## 2013-07-28 IMAGING — CR DG CHEST 2V
2 series · 2 of 2 positions shown · non-contrast
Comparison: [DATE]

CLINICAL DATA: No chest complaints. Hypertension.

EXAM:
CHEST  2 VIEW

[w chest pa]
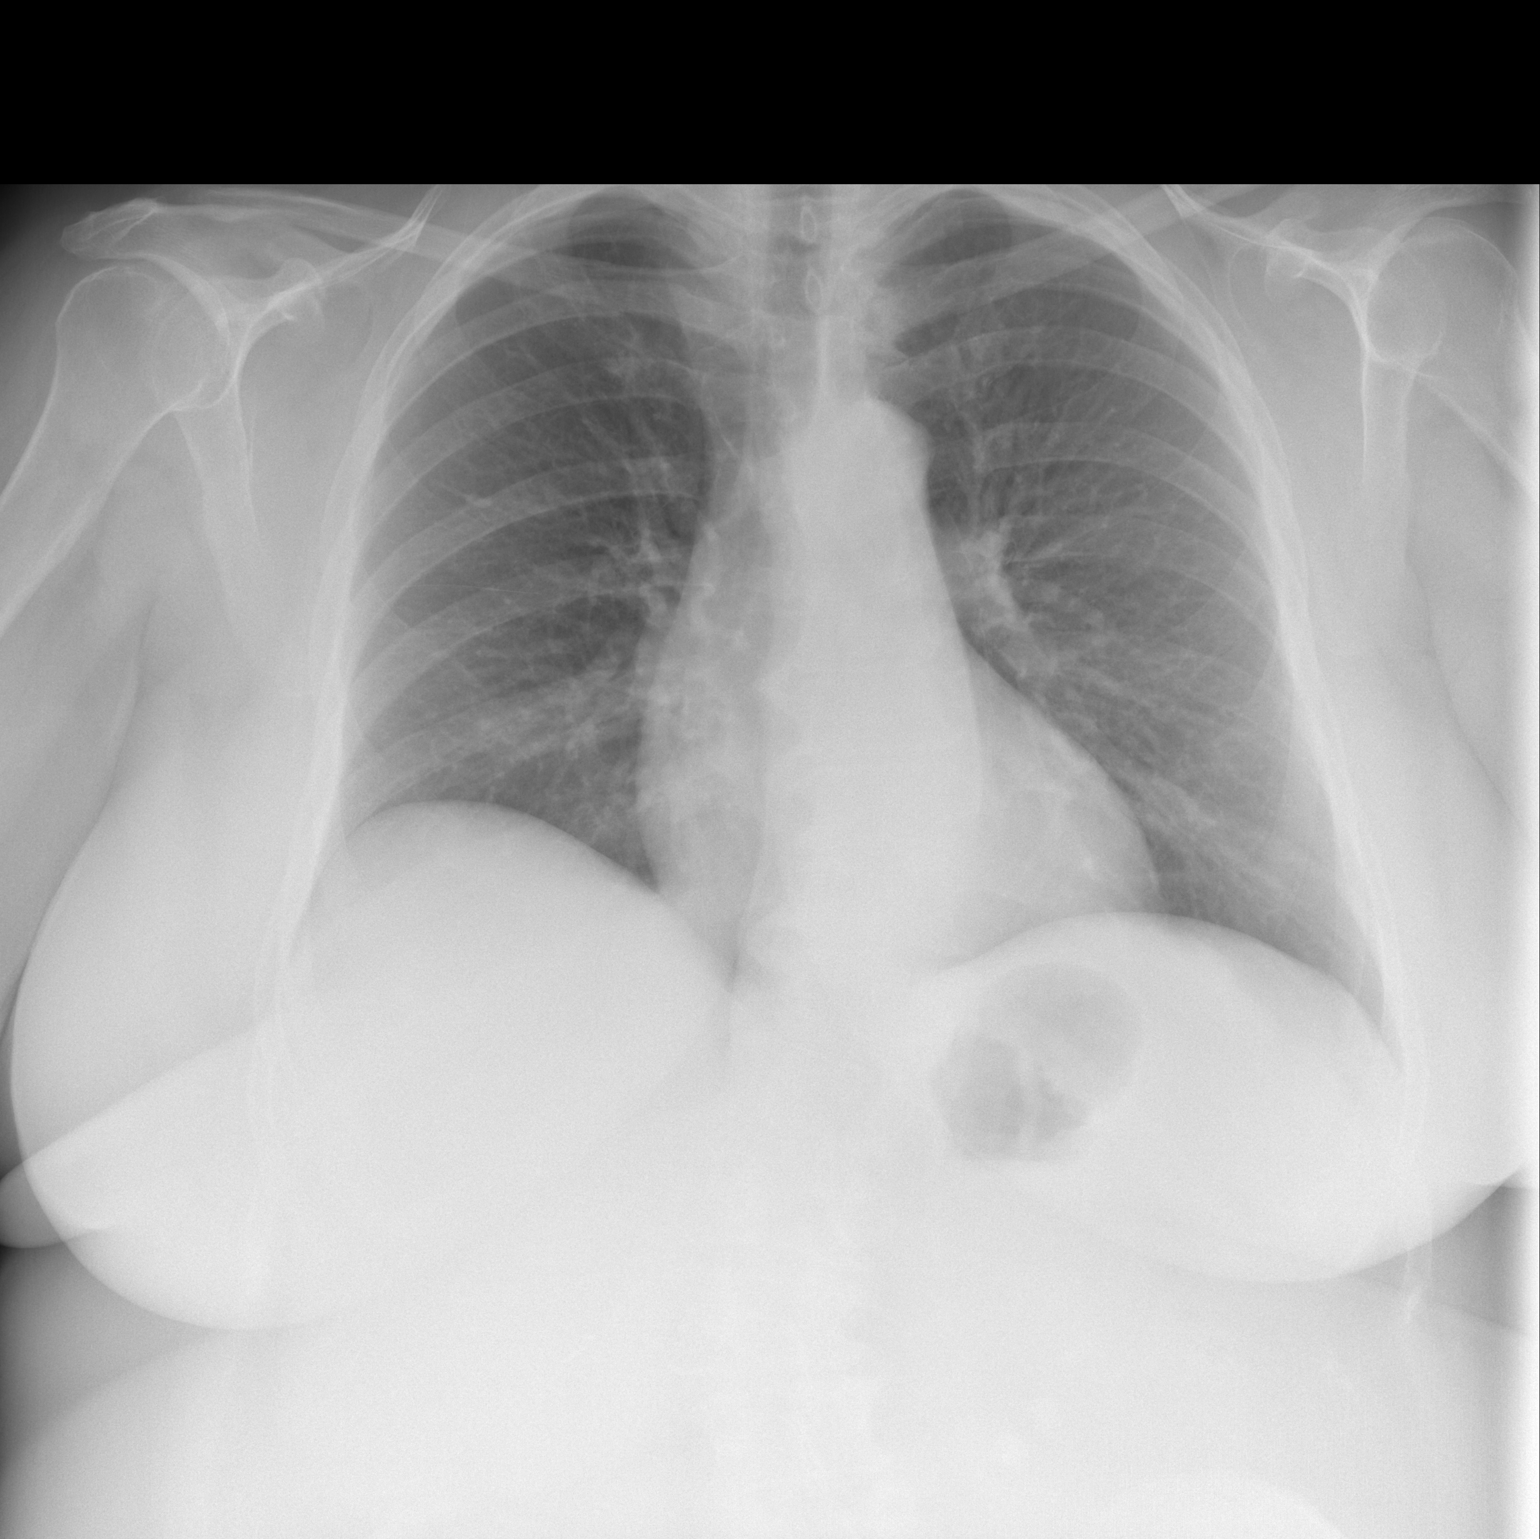

[w chest lat]
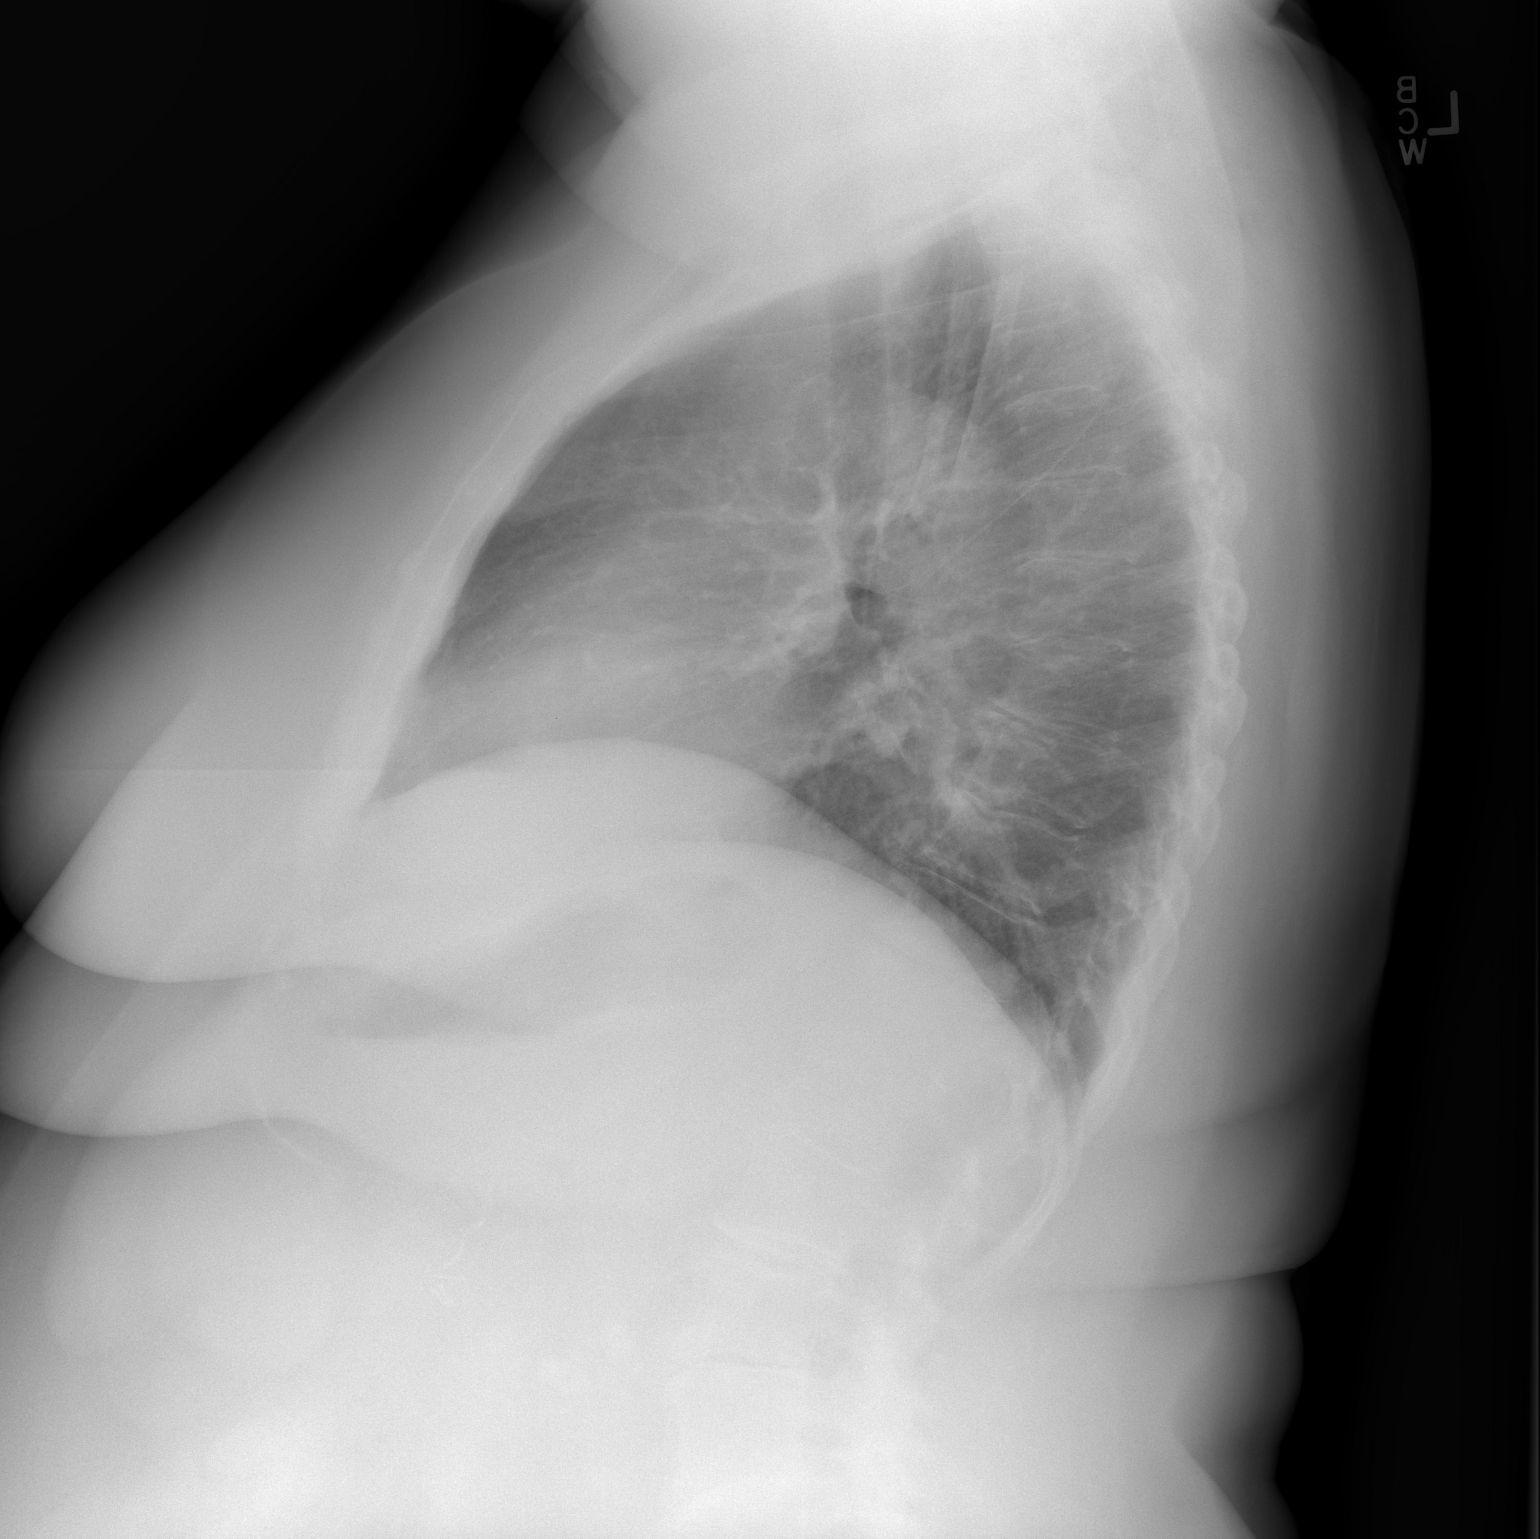

[2 of 2 positions shown; findings below may reference images not displayed]

FINDINGS: The heart size and mediastinal contours are within normal limits.
Both lungs are clear.

The bony thorax is demineralized. There are degenerative changes
along the thoracic spine.
IMPRESSION: No active cardiopulmonary disease.

## 2013-07-28 NOTE — Patient Instructions (Addendum)
Natasha Warren  07/28/2013                           YOUR PROCEDURE IS SCHEDULED ON:  07/31/13               PLEASE REPORT TO SHORT STAY CENTER AT : 5:30 AM               CALL THIS NUMBER IF ANY PROBLEMS THE DAY OF SURGERY :               [phone]                      REMEMBER:   Do not eat food or drink liquids AFTER MIDNIGHT   Take these medicines the morning of surgery with A SIP OF WATER:  LEVOTHYROXINE / METOPROLOL   Do not wear jewelry, make-up   Do not wear lotions, powders, or perfumes.   Do not shave legs or underarms 12 hrs. before surgery (men may shave face)  Do not bring valuables to the hospital.  Contacts, dentures or bridgework may not be worn into surgery.  Leave suitcase in the car. After surgery it may be brought to your room.  For patients admitted to the hospital more than one night, checkout time is 11:00                          The day of discharge.   Patients discharged the day of surgery will not be allowed to drive home                             If going home same day of surgery, must have someone stay with you first                           24 hrs at home and arrange for some one to drive you home from hospital.    Special Instructions:   Please read over the following fact sheets that you were given:               1. DISCONTINUE ASPIRIN / HERBAL MEDS 5 DAYS PREOP                      2. Mary[place]                                                X_____________________________________________________________________        Failure to follow these instructions may result in cancellation of your surgery

## 2013-07-28 NOTE — Progress Notes (Signed)
07/28/13 1041  OBSTRUCTIVE SLEEP APNEA  Have you ever been diagnosed with sleep apnea through a sleep study? No  Do you snore loudly (loud enough to be heard through closed doors)?  1  Do you often feel tired, fatigued, or sleepy during the daytime? 1  Has anyone observed you stop breathing during your sleep? 0  Do you have, or are you being treated for high blood pressure? 1  BMI more than 35 kg/m2? 1  Age over 67 years old? 1  Neck circumference greater than 40 cm/18 inches? 0  Gender: 0  Obstructive Sleep Apnea Score 5  Score 4 or greater  Results sent to PCP

## 2013-07-28 NOTE — Progress Notes (Signed)
Abnormal CMET faxed to Dr. Zella Richer

## 2013-07-30 NOTE — Anesthesia Preprocedure Evaluation (Addendum)
Anesthesia Evaluation  Patient identified by MRN, date of birth, ID band Patient awake    Reviewed: Allergy & Precautions, H&P , NPO status , Patient's Chart, lab work & pertinent test results, reviewed documented beta blocker date and time   Airway Mallampati: II TM Distance: >3 FB Neck ROM: full    Dental  (+) Teeth Intact and Dental Advisory Given   Pulmonary neg pulmonary ROS,  breath sounds clear to auscultation  Pulmonary exam normal       Cardiovascular Exercise Tolerance: Good hypertension, Pt. on home beta blockers and Pt. on medications Rhythm:regular Rate:Normal     Neuro/Psych negative neurological ROS  negative psych ROS   GI/Hepatic negative GI ROS, Neg liver ROS,   Endo/Other  Hypothyroidism Morbid obesityPrimary hyperparathyroidism  Renal/GU negative Renal ROS  negative genitourinary   Musculoskeletal   Abdominal (+) + obese,   Peds  Hematology negative hematology ROS (+)   Anesthesia Other Findings   Reproductive/Obstetrics negative OB ROS                          Anesthesia Physical Anesthesia Plan  ASA: III  Anesthesia Plan: General   Post-op Pain Management:    Induction: Intravenous  Airway Management Planned: Oral ETT  Additional Equipment:   Intra-op Plan:   Post-operative Plan: Extubation in OR  Informed Consent: I have reviewed the patients History and Physical, chart, labs and discussed the procedure including the risks, benefits and alternatives for the proposed anesthesia with the patient or authorized representative who has indicated his/her understanding and acceptance.   Dental Advisory Given  Plan Discussed with: CRNA and Surgeon  Anesthesia Plan Comments:         Anesthesia Quick Evaluation

## 2013-07-31 ENCOUNTER — Ambulatory Visit (HOSPITAL_COMMUNITY)
Admission: RE | Admit: 2013-07-31 | Discharge: 2013-08-01 | Disposition: A | Discharge status: Home / Self Care | Payer: Medicare Other | Source: Ambulatory Visit | Attending: General Surgery | Admitting: General Surgery

## 2013-07-31 ENCOUNTER — Ambulatory Visit (HOSPITAL_COMMUNITY): Payer: Medicare Other | Admitting: Anesthesiology

## 2013-07-31 ENCOUNTER — Encounter (HOSPITAL_COMMUNITY)
Admission: RE | Disposition: A | Discharge status: Home / Self Care | Payer: Self-pay | Source: Ambulatory Visit | Attending: General Surgery

## 2013-07-31 ENCOUNTER — Encounter (HOSPITAL_COMMUNITY): Payer: Self-pay | Admitting: *Deleted

## 2013-07-31 ENCOUNTER — Encounter (HOSPITAL_COMMUNITY): Payer: Medicare Other | Admitting: Anesthesiology

## 2013-07-31 DIAGNOSIS — Z79899 Other long term (current) drug therapy: Secondary | ICD-10-CM | POA: Diagnosis not present

## 2013-07-31 DIAGNOSIS — E0500 Thyrotoxicosis with diffuse goiter without thyrotoxic crisis or storm: Secondary | ICD-10-CM | POA: Diagnosis not present

## 2013-07-31 DIAGNOSIS — Z90710 Acquired absence of both cervix and uterus: Secondary | ICD-10-CM | POA: Diagnosis not present

## 2013-07-31 DIAGNOSIS — Z860100 Personal history of colon polyps, unspecified: Secondary | ICD-10-CM | POA: Insufficient documentation

## 2013-07-31 DIAGNOSIS — E785 Hyperlipidemia, unspecified: Secondary | ICD-10-CM | POA: Insufficient documentation

## 2013-07-31 DIAGNOSIS — E210 Primary hyperparathyroidism: Secondary | ICD-10-CM | POA: Insufficient documentation

## 2013-07-31 DIAGNOSIS — K589 Irritable bowel syndrome without diarrhea: Secondary | ICD-10-CM | POA: Diagnosis not present

## 2013-07-31 DIAGNOSIS — E039 Hypothyroidism, unspecified: Secondary | ICD-10-CM | POA: Diagnosis not present

## 2013-07-31 DIAGNOSIS — Z9089 Acquired absence of other organs: Secondary | ICD-10-CM | POA: Insufficient documentation

## 2013-07-31 DIAGNOSIS — I10 Essential (primary) hypertension: Secondary | ICD-10-CM | POA: Diagnosis not present

## 2013-07-31 DIAGNOSIS — E213 Hyperparathyroidism, unspecified: Secondary | ICD-10-CM | POA: Diagnosis present

## 2013-07-31 HISTORY — PX: THYROIDECTOMY: SHX17

## 2013-07-31 HISTORY — PX: THYROID EXPLORATION: SHX5280

## 2013-07-31 LAB — CALCIUM: Calcium: 10.9 mg/dL — ABNORMAL HIGH (ref 8.4–10.5)

## 2013-07-31 LAB — GLUCOSE, CAPILLARY: Glucose-Capillary: 210 mg/dL — ABNORMAL HIGH (ref 70–99)

## 2013-07-31 SURGERY — THYROIDECTOMY
Anesthesia: General | Site: Neck | Wound class: Clean

## 2013-07-31 MED ORDER — ONDANSETRON HCL 4 MG/2ML IJ SOLN: 4.0000 mg | Freq: Four times a day (QID) | INTRAMUSCULAR | Status: DC | PRN

## 2013-07-31 MED ORDER — CEFAZOLIN SODIUM-DEXTROSE 2-3 GM-% IV SOLR
INTRAVENOUS | Status: AC
  Filled 2013-07-31: qty 50

## 2013-07-31 MED ORDER — BIOTENE DRY MOUTH MT LIQD
15.0000 mL | Freq: Two times a day (BID) | OROMUCOSAL | Status: DC
  Administered 2013-07-31 – 2013-08-01 (×2): 15 mL via OROMUCOSAL

## 2013-07-31 MED ORDER — LACTATED RINGERS IV SOLN
INTRAVENOUS | Status: DC | PRN
  Administered 2013-07-31 (×2): via INTRAVENOUS

## 2013-07-31 MED ORDER — GLYCOPYRROLATE 0.2 MG/ML IJ SOLN
INTRAMUSCULAR | Status: DC | PRN
  Administered 2013-07-31: .8 mg via INTRAVENOUS

## 2013-07-31 MED ORDER — CEFAZOLIN SODIUM-DEXTROSE 2-3 GM-% IV SOLR
2.0000 g | INTRAVENOUS | Status: AC
  Administered 2013-07-31: 2 g via INTRAVENOUS

## 2013-07-31 MED ORDER — PROPOFOL 10 MG/ML IV BOLUS
INTRAVENOUS | Status: DC | PRN
  Administered 2013-07-31: 160 mg via INTRAVENOUS

## 2013-07-31 MED ORDER — MIDAZOLAM HCL 5 MG/5ML IJ SOLN
INTRAMUSCULAR | Status: DC | PRN
  Administered 2013-07-31 (×2): 1 mg via INTRAVENOUS

## 2013-07-31 MED ORDER — FENTANYL CITRATE 0.05 MG/ML IJ SOLN
INTRAMUSCULAR | Status: DC | PRN
  Administered 2013-07-31: 100 ug via INTRAVENOUS
  Administered 2013-07-31 (×4): 50 ug via INTRAVENOUS

## 2013-07-31 MED ORDER — SUCCINYLCHOLINE CHLORIDE 20 MG/ML IJ SOLN
INTRAMUSCULAR | Status: DC | PRN
  Administered 2013-07-31: 120 mg via INTRAVENOUS

## 2013-07-31 MED ORDER — HYDROCODONE-ACETAMINOPHEN 5-325 MG PO TABS: 1.0000 | ORAL_TABLET | ORAL | Status: DC | PRN

## 2013-07-31 MED ORDER — KETAMINE HCL 10 MG/ML IJ SOLN
INTRAMUSCULAR | Status: DC | PRN
  Administered 2013-07-31: 20 mg via INTRAVENOUS

## 2013-07-31 MED ORDER — KCL-LACTATED RINGERS-D5W 20 MEQ/L IV SOLN
INTRAVENOUS | Status: DC
  Administered 2013-07-31 – 2013-08-01 (×2): via INTRAVENOUS
  Filled 2013-07-31 (×2): qty 1000

## 2013-07-31 MED ORDER — EPHEDRINE SULFATE 50 MG/ML IJ SOLN
INTRAMUSCULAR | Status: DC | PRN
  Administered 2013-07-31: 10 mg via INTRAVENOUS

## 2013-07-31 MED ORDER — LIDOCAINE HCL (CARDIAC) 20 MG/ML IV SOLN
INTRAVENOUS | Status: DC | PRN
  Administered 2013-07-31: 80 mg via INTRAVENOUS

## 2013-07-31 MED ORDER — PHENYLEPHRINE HCL 10 MG/ML IJ SOLN
INTRAMUSCULAR | Status: DC | PRN
  Administered 2013-07-31 (×5): 80 ug via INTRAVENOUS

## 2013-07-31 MED ORDER — ONDANSETRON HCL 4 MG/2ML IJ SOLN
INTRAMUSCULAR | Status: DC | PRN
  Administered 2013-07-31: 4 mg via INTRAVENOUS

## 2013-07-31 MED ORDER — HYDROMORPHONE HCL PF 1 MG/ML IJ SOLN: 0.2500 mg | INTRAMUSCULAR | Status: DC | PRN

## 2013-07-31 MED ORDER — DEXAMETHASONE SODIUM PHOSPHATE 10 MG/ML IJ SOLN
INTRAMUSCULAR | Status: DC | PRN
  Administered 2013-07-31: 10 mg via INTRAVENOUS

## 2013-07-31 MED ORDER — ROCURONIUM BROMIDE 100 MG/10ML IV SOLN
INTRAVENOUS | Status: DC | PRN
  Administered 2013-07-31: 10 mg via INTRAVENOUS
  Administered 2013-07-31: 40 mg via INTRAVENOUS
  Administered 2013-07-31: 5 mg via INTRAVENOUS

## 2013-07-31 MED ORDER — NEOSTIGMINE METHYLSULFATE 1 MG/ML IJ SOLN
INTRAMUSCULAR | Status: DC | PRN
  Administered 2013-07-31: 5 mg via INTRAVENOUS

## 2013-07-31 MED ORDER — LACTATED RINGERS IV SOLN: INTRAVENOUS | Status: DC

## 2013-07-31 MED ORDER — METOPROLOL SUCCINATE ER 50 MG PO TB24
50.0000 mg | ORAL_TABLET | Freq: Every morning | ORAL | Status: DC
  Administered 2013-08-01: 50 mg via ORAL
  Filled 2013-07-31: qty 1

## 2013-07-31 MED ORDER — COLCHICINE 0.6 MG PO TABS
0.6000 mg | ORAL_TABLET | Freq: Every day | ORAL | Status: DC | PRN
  Filled 2013-07-31: qty 1

## 2013-07-31 MED ORDER — LEVOTHYROXINE SODIUM 50 MCG PO TABS
50.0000 ug | ORAL_TABLET | Freq: Every day | ORAL | Status: DC
  Administered 2013-08-01: 50 ug via ORAL
  Filled 2013-07-31 (×2): qty 1

## 2013-07-31 MED ORDER — ONDANSETRON HCL 4 MG PO TABS: 4.0000 mg | ORAL_TABLET | Freq: Four times a day (QID) | ORAL | Status: DC | PRN

## 2013-07-31 MED ORDER — MORPHINE SULFATE 2 MG/ML IJ SOLN
2.0000 mg | INTRAMUSCULAR | Status: DC | PRN
  Administered 2013-07-31 – 2013-08-01 (×6): 2 mg via INTRAVENOUS
  Filled 2013-07-31: qty 1
  Filled 2013-07-31: qty 2
  Filled 2013-07-31 (×4): qty 1

## 2013-07-31 SURGICAL SUPPLY — 44 items
APL SKNCLS STERI-STRIP NONHPOA (GAUZE/BANDAGES/DRESSINGS) ×1
ATTRACTOMAT 16X20 MAGNETIC DRP (DRAPES) ×2 IMPLANT
BENZOIN TINCTURE PRP APPL 2/3 (GAUZE/BANDAGES/DRESSINGS) ×2 IMPLANT
BLADE HEX COATED 2.75 (ELECTRODE) ×2 IMPLANT
BLADE SURG 15 STRL LF DISP TIS (BLADE) ×1 IMPLANT
BLADE SURG 15 STRL SS (BLADE) ×2
CANISTER SUCTION 2500CC (MISCELLANEOUS) ×2 IMPLANT
CLIP TI MEDIUM 6 (CLIP) ×2 IMPLANT
CLIP TI WIDE RED SMALL 6 (CLIP) ×4 IMPLANT
CLOTH BEACON ORANGE TIMEOUT ST (SAFETY) IMPLANT
DISSECTOR ROUND CHERRY 3/8 STR (MISCELLANEOUS) ×2 IMPLANT
DRAIN PENROSE 18X1/4 LTX STRL (WOUND CARE) IMPLANT
DRAPE PED LAPAROTOMY (DRAPES) ×2 IMPLANT
DRESSING SURGICEL FIBRLLR 1X2 (HEMOSTASIS) ×1 IMPLANT
DRSG SURGICEL FIBRILLAR 1X2 (HEMOSTASIS) ×2
ELECT REM PT RETURN 9FT ADLT (ELECTROSURGICAL) ×2
ELECTRODE REM PT RTRN 9FT ADLT (ELECTROSURGICAL) ×1 IMPLANT
GAUZE SPONGE 4X4 16PLY XRAY LF (GAUZE/BANDAGES/DRESSINGS) ×2 IMPLANT
GLOVE BIOGEL PI IND STRL 7.0 (GLOVE) IMPLANT
GLOVE BIOGEL PI INDICATOR 7.0 (GLOVE)
GLOVE ECLIPSE 8.0 STRL XLNG CF (GLOVE) ×10 IMPLANT
GLOVE INDICATOR 8.0 STRL GRN (GLOVE) ×4 IMPLANT
GOWN PREVENTION PLUS LG XLONG (DISPOSABLE) ×2 IMPLANT
GOWN STRL REIN XL XLG (GOWN DISPOSABLE) ×6 IMPLANT
HEMOSTAT SURGICEL 2X14 (HEMOSTASIS) IMPLANT
KIT BASIN OR (CUSTOM PROCEDURE TRAY) ×2 IMPLANT
NS IRRIG 1000ML POUR BTL (IV SOLUTION) ×2 IMPLANT
PACK BASIC VI WITH GOWN DISP (CUSTOM PROCEDURE TRAY) ×2 IMPLANT
PENCIL BUTTON HOLSTER BLD 10FT (ELECTRODE) ×2 IMPLANT
SHEARS HARMONIC 9CM CVD (BLADE) ×2 IMPLANT
SPONGE GAUZE 4X4 12PLY (GAUZE/BANDAGES/DRESSINGS) IMPLANT
STAPLER VISISTAT 35W (STAPLE) IMPLANT
STRIP CLOSURE SKIN 1/2X4 (GAUZE/BANDAGES/DRESSINGS) ×2 IMPLANT
SUT MNCRL AB 4-0 PS2 18 (SUTURE) ×2 IMPLANT
SUT SILK 2 0 (SUTURE)
SUT SILK 2-0 18XBRD TIE 12 (SUTURE) IMPLANT
SUT SILK 3 0 (SUTURE) ×2
SUT SILK 3-0 18XBRD TIE 12 (SUTURE) ×1 IMPLANT
SUT VIC AB 3-0 SH 18 (SUTURE) ×4 IMPLANT
SUT VICRYL 3 0 BR 18  UND (SUTURE)
SUT VICRYL 3 0 BR 18 UND (SUTURE) IMPLANT
SYR BULB IRRIGATION 50ML (SYRINGE) ×2 IMPLANT
TOWEL OR 17X26 10 PK STRL BLUE (TOWEL DISPOSABLE) ×2 IMPLANT
YANKAUER SUCT BULB TIP 10FT TU (MISCELLANEOUS) ×2 IMPLANT

## 2013-07-31 NOTE — H&P (View-Only) (Signed)
Patient ID: Natasha Warren, female   DOB: June 24, 1946, 67 y.o.   MRN: [id number]  Chief Complaint  Patient presents with  . Follow-up    LTFU recheck parathyroid    HPI Natasha Warren is a 67 y.o. female.   HPI  She has seen Dr. Buddy Duty and he is evaluated her. His evaluation is consistent with primary hyperparathyroidism. She is here to discuss parathyroidectomy. The other option is medical treatment with bisphosphonate therapy.  She is interested in the surgical option.  Past Medical History  Diagnosis Date  . Hypertension   . Thyroid disorder   . Mouth lesion   . Irritable bowel syndrome   . Seasonal allergies   . Hypothyroidism   . Colon polyps   . Arthritis   . Hyperlipidemia   . UTI (lower urinary tract infection)   . Urolithiasis     Past Surgical History  Procedure Laterality Date  . Partial hysterectomy  09/1984  . Bladder tack  09/1984  . Lithotripsy  06/1995  . 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 Rotor cuff  2011    Right Shoulder  . Knee arthroscopy  12/2010    Left knee  . Colonoscopy  06/2006    This was her second one  . Appendectomy  1954  . Cholecystectomy  01/1999  . Colonoscopy  07/12/2011    Procedure: COLONOSCOPY;  Surgeon: Rogene Houston, MD;  Location: [facility];  Service: Endoscopy;  Laterality: N/A;  10:30 am  . Abdominal hysterectomy      Family History  Problem Relation Age of Onset  . Hypertension Mother   . Arthritis Mother   . Hypertension Father   . Healthy Brother   . Healthy Daughter   . Healthy Daughter   . Colon cancer Neg Hx     Social History History  Substance Use Topics  . Smoking status: Never Smoker   . Smokeless tobacco: Never Used  . Alcohol Use: Yes     Comment: Very Rarely 1/2 glass of beer    Allergies  Allergen Reactions  . Ace Inhibitors     Patient doesn't remember this allergy.     Current Outpatient Prescriptions   Medication Sig Dispense Refill  . acetaminophen (TYLENOL) 500 MG tablet Take 1,000 mg by mouth every 6 (six) hours as needed for pain.      Marland Kitchen colchicine (COLCRYS) 0.6 MG tablet Take 0.6 mg by mouth daily.      Marland Kitchen levothyroxine (SYNTHROID, LEVOTHROID) 50 MCG tablet Take 50 mcg by mouth daily.        Marland Kitchen losartan-hydrochlorothiazide (HYZAAR) 100-25 MG per tablet Take 1 tablet by mouth daily.        . metoprolol (LOPRESSOR) 50 MG tablet Take 50 mg by mouth daily.         No current facility-administered medications for this visit.    Review of Systems Review of Systems  Constitutional: Negative.   Respiratory: Negative.   Cardiovascular: Negative.     Blood pressure 118/80, pulse 68, temperature 98.4 F (36.9 C), temperature source Temporal, resp. rate 15, height 5' 0.5" (1.537 m), weight 229 lb 3.2 oz (103.964 kg).  Physical Exam Physical Exam  Constitutional: No distress.  Obese female  HENT:  Head: Normocephalic and atraumatic.  Eyes: No scleral icterus.  Neck: Neck supple.  No thyroid enlargement or palpable neck masses  Cardiovascular: Normal rate and regular rhythm.   Pulmonary/Chest: Effort normal and breath sounds normal.  Abdominal: Soft. She exhibits no mass. There is no tenderness.  Musculoskeletal: She exhibits no edema.  Lymphadenopathy:    She has no cervical adenopathy.    Data Reviewed My old node. Note from Dr. Buddy Duty.  Assessment    Primary hyperparathyroidism. Parathyroid adenoma did not localize on sestamibi scan.     Plan    Neck exploration and parathyroidectomy. We discussed the procedure, risks, and aftercare. Risks include but are not limited to bleeding, infection, wound problems, anesthesia, inability to find the gland, recurrent laryngeal nerve damage, permanent hoarseness,need for tracheostomy, recurrence.  She seems to understand and would like to proceed.       Ambria Mayfield J 07/16/2013, 12:46 PM

## 2013-07-31 NOTE — Anesthesia Postprocedure Evaluation (Signed)
  Anesthesia Post-op Note  Patient: Natasha Warren  Procedure(s) Performed: Procedure(s) (LRB): PARATHYROIDECTOMY (N/A) neck EXPLORATION (N/A)  Patient Location: PACU  Anesthesia Type: General  Level of Consciousness: awake and alert   Airway and Oxygen Therapy: Patient Spontanous Breathing  Post-op Pain: mild  Post-op Assessment: Post-op Vital signs reviewed, Patient's Cardiovascular Status Stable, Respiratory Function Stable, Patent Airway and No signs of Nausea or vomiting  Last Vitals:  Filed Vitals:   07/31/13 1030  BP: 147/79  Pulse: 73  Temp: 36.6 C  Resp: 14    Post-op Vital Signs: stable   Complications: No apparent anesthesia complications

## 2013-07-31 NOTE — Transfer of Care (Signed)
Immediate Anesthesia Transfer of Care Note  Patient: Natasha Warren  Procedure(s) Performed: Procedure(s) (LRB): PARATHYROIDECTOMY (N/A) neck EXPLORATION (N/A)  Patient Location: PACU  Anesthesia Type: General  Level of Consciousness: sedated, patient cooperative and responds to stimulation  Airway & Oxygen Therapy: Patient Spontanous Breathing and Patient connected to face mask oxgen  Post-op Assessment: Report given to PACU RN and Post -op Vital signs reviewed and stable  Post vital signs: Reviewed and stable  Complications: No apparent anesthesia complications

## 2013-07-31 NOTE — Interval H&P Note (Signed)
History and Physical Interval Note:  07/31/2013 7:10 AM  Natasha Warren  has presented today for surgery, with the diagnosis of primary hyperparathyroidism   The various methods of treatment have been discussed with the patient and family. After consideration of risks, benefits and other options for treatment, the patient has consented to  Procedure(s): PARATHYROIDECTOMY (N/A) neck EXPLORATION (N/A) as a surgical intervention .  The patient's history has been reviewed, patient examined, no change in status, stable for surgery.  I have reviewed the patient's chart and labs.  Questions were answered to the patient's satisfaction.     Jernee Murtaugh Lenna Sciara

## 2013-07-31 NOTE — Preoperative (Signed)
Beta Blockers   Reason not to administer Beta Blockers:Not Applicable, patient took beta blocker this AM 07/31/13

## 2013-07-31 NOTE — Op Note (Signed)
Operative Note  Natasha Warren female 67 y.o. 07/31/2013  PREOPERATIVE DX:  Primary hyperparathyroidism  POSTOPERATIVE DX:  Same  PROCEDURE:  Neck exploration, left superior parathyroidectomy         Surgeon: Odis Hollingshead   Assistants: Armandina Gemma  Anesthesia: General endotracheal anesthesia  Indications:  This is a 67 year old female with hypercalcemia and an elevated parathyroid hormone level. Nuclear medicine parathyroid scan did not localize an adenoma. She's been offered medical therapy but surgical therapy was the first recommendation. She now presents for the above procedure.    Procedure Detail:    She was brought to the operating room placed supine on the operating table and a general anesthetic was administered. The neck was placed in slight extension. The head was elevated approximately 20. The neck and upper chest areas were sterilely prepped and draped.  A low transverse neck incision was made dividing the skin, subcutaneous tissues, and platysma muscle. Subplatysmal flaps were raised superiorly to the thyroid cartilage and inferiorly to the sternal notch.  A self-retaining retractor was placed. This fascia between the strap muscles was divided exposing the midline. The left strap muscles were dissected free from the left lobe of the thyroid gland. Blunt dissection was used close to the left lobe of the thyroid gland and it was rotated medially. A normal appearing left inferior parathyroid gland was identified. The superior pole was mobilized. There was no obvious superior parathyroid gland noted on the left side. I explored the area of the carotid sheath as well as the thyrothymic tract but did not find any enlarged parathyroid gland.  I subsequently approached the right side. The strap muscles were dissected free from the right lobe of the thyroid gland.  I then began inferiorly locating the right lobe of the thyroid gland medially. I identified what was an inferior  parathyroid gland that appeared normal. Posterior to this I identified the right recurrent laryngeal nerve. I then mobilized superior pole of the thyroid gland and did not definitely see a right superior parathyroid. I then looked in the area of the carotid sheath and the thyro-thymic tract. No obvious enlarged parathyroid gland was noted. I then mobilized the esophagus and in the retroesophageal space saw an abnormal-appearing gland that appeared to be extending from the left side. I went back to the left side and identified an abnormal left superior parathyroid gland in the retroesophageal space with a long vascular pedicle. I skeletonized the vascular pedicle clipped and divided it. The parathyroid gland appeared at least 5 times the size of a normal parathyroid glands identified. This gland was sent to pathology and hypercellular changes were noted consistent with a parathyroid adenoma. The gland weighed 500 mg.  I inspected both sides of the neck. I identified the left recurrent laryngeal nerve. Both recurrent laryngeal nerves were intact. Hemostasis was adequate. Surgicel was then placed on both sides of the neck. The strap muscles were approximated with interrupted 3-0 Vicryl sutures. The platysma muscle was approximated with interrupted 3-0 Vicryl sutures. The skin was closed with a 4-0 Monocryl subcuticular stitch. Steri-Strips and sterile dressings were applied.  She tolerated the procedure well without any apparent complications and was taken to the recovery room in satisfactory condition.    Findings:  Enlarged left superior parathyroid gland weighing 500 mg.  Estimated Blood Loss:  less than 100 mL         Drains: none  Blood Given: none          Specimens: left  superior parathyroid gland        Complications:  * No complications entered in OR log *         Disposition: PACU - hemodynamically stable.         Condition: stable

## 2013-08-01 ENCOUNTER — Encounter (HOSPITAL_COMMUNITY): Payer: Self-pay | Admitting: General Surgery

## 2013-08-01 LAB — CBC
HCT: 39.6 % (ref 36.0–46.0)
Hemoglobin: 13.1 g/dL (ref 12.0–15.0)
MCV: 89.8 fL (ref 78.0–100.0)
RDW: 13.9 % (ref 11.5–15.5)
WBC: 14.8 10*3/uL — ABNORMAL HIGH (ref 4.0–10.5)

## 2013-08-01 LAB — BASIC METABOLIC PANEL
BUN: 24 mg/dL — ABNORMAL HIGH (ref 6–23)
Calcium: 10 mg/dL (ref 8.4–10.5)
Chloride: 101 mEq/L (ref 96–112)
Creatinine, Ser: 0.94 mg/dL (ref 0.50–1.10)
Glucose, Bld: 150 mg/dL — ABNORMAL HIGH (ref 70–99)
Potassium: 4.5 mEq/L (ref 3.5–5.1)
Sodium: 135 mEq/L (ref 135–145)

## 2013-08-01 MED ORDER — HYDROCODONE-ACETAMINOPHEN 5-325 MG PO TABS: 1.0000 | ORAL_TABLET | ORAL | Status: DC | PRN

## 2013-08-01 NOTE — Discharge Summary (Signed)
Physician Discharge Summary  Patient ID: Natasha Warren MRN: [id number] DOB/AGE: Apr 04, 1946 67 y.o.  Admit date: 07/31/2013 Discharge date: 08/01/2013  Admission Diagnoses:  Primary hyperparathyroidism  Discharge Diagnoses:  Principal Problem:   Primary hyperparathyroidism s/p left superior parathyroidectomy   Discharged Condition: good  Hospital Course: She underwent the above procedure and tolerated it well.  Her calcium level was 10 on POD#1.  She was able to be discharged on POD #1.  Discharge instructions were given to her.  Consults: None  Significant Diagnostic Studies: none  Treatments: surgery: Neck exploration and left superior parathyroidectomy  Discharge Exam: Blood pressure 132/79, pulse 73, temperature 98 F (36.7 C), temperature source Oral, resp. rate 18, height 5\' 2"  (1.575 m), weight 230 lb (104.327 kg), SpO2 96.00%.   Disposition: 01-Home or Self Care     Medication List         acetaminophen 500 MG tablet  Commonly known as:  TYLENOL  Take 1,000 mg by mouth every 6 (six) hours as needed for pain.     COLCRYS 0.6 MG tablet  Generic drug:  colchicine  Take 0.6 mg by mouth daily as needed (Gout flare ups).     HYDROcodone-acetaminophen 5-325 MG per tablet  Commonly known as:  NORCO/VICODIN  Take 1-2 tablets by mouth every 4 (four) hours as needed for moderate pain.     levothyroxine 50 MCG tablet  Commonly known as:  SYNTHROID, LEVOTHROID  Take 50 mcg by mouth daily before breakfast.     losartan-hydrochlorothiazide 100-25 MG per tablet  Commonly known as:  HYZAAR  Take 1 tablet by mouth every morning.     metoprolol succinate 50 MG 24 hr tablet  Commonly known as:  TOPROL-XL  Take 50 mg by mouth every morning. Take with or immediately following a meal.         Signed: Tiago Humphrey J 08/01/2013, 9:04 AM

## 2013-08-01 NOTE — Progress Notes (Signed)
1 Day Post-Op  Subjective: Sore. Swallowing okay.  Objective: Vital signs in last 24 hours: Temp:  [97.4 F (36.3 C)-98.4 F (36.9 C)] 98 F (36.7 C) (11/14 0602) Pulse Rate:  [73-91] 73 (11/14 0602) Resp:  [11-24] 18 (11/14 0602) BP: (125-184)/(74-98) 132/79 mmHg (11/14 0602) SpO2:  [93 %-100 %] 96 % (11/14 0602) Weight:  [230 lb (104.327 kg)] 230 lb (104.327 kg) (11/13 1208) Last BM Date: 07/31/13  Intake/Output from previous day: 11/13 0701 - 11/14 0700 In: 4251.3 [P.O.:1080; I.V.:3171.3] Out: 1860 [Urine:1850; Blood:10] Intake/Output this shift:    PE: General- In NAD. Voice strong. Neck-incision clean, no significant swelling   Lab Results:   Recent Labs  08/01/13 0447  WBC 14.8*  HGB 13.1  HCT 39.6  PLT 280   BMET  Recent Labs  07/31/13 1706 08/01/13 0447  NA  --  135  K  --  4.5  CL  --  101  CO2  --  23  GLUCOSE  --  150*  BUN  --  24*  CREATININE  --  0.94  CALCIUM 10.9* 10.0   PT/INR No results found for this basename: LABPROT, INR,  in the last 72 hours Comprehensive Metabolic Panel:    Component Value Date/Time   NA 135 08/01/2013 0447   K 4.5 08/01/2013 0447   CL 101 08/01/2013 0447   CO2 23 08/01/2013 0447   BUN 24* 08/01/2013 0447   CREATININE 0.94 08/01/2013 0447   GLUCOSE 150* 08/01/2013 0447   CALCIUM 10.0 08/01/2013 0447   AST 15 07/28/2013 1110   ALT 17 07/28/2013 1110   ALKPHOS 80 07/28/2013 1110   BILITOT 0.3 07/28/2013 1110   PROT 7.6 07/28/2013 1110   ALBUMIN 4.0 07/28/2013 1110     Studies/Results: No results found.  Anti-infectives: Anti-infectives   Start     Dose/Rate Route Frequency Ordered Stop   07/31/13 0519  ceFAZolin (ANCEF) IVPB 2 g/50 mL premix     2 g 100 mL/hr over 30 Minutes Intravenous On call to O.R. 07/31/13 0519 07/31/13 0735      Assessment Principal Problem:   Hyperparathyroidism s/p left superior parathyroidectomy-doing well; calcium down to 10.0    LOS: 1 day   Plan:  Discharge.  Instructions given.   Natasha Warren 08/01/2013

## 2013-08-21 ENCOUNTER — Ambulatory Visit (INDEPENDENT_AMBULATORY_CARE_PROVIDER_SITE_OTHER): Payer: Medicare Other | Admitting: General Surgery

## 2013-08-21 ENCOUNTER — Other Ambulatory Visit (INDEPENDENT_AMBULATORY_CARE_PROVIDER_SITE_OTHER): Payer: Self-pay | Admitting: General Surgery

## 2013-08-21 ENCOUNTER — Encounter (INDEPENDENT_AMBULATORY_CARE_PROVIDER_SITE_OTHER): Payer: Self-pay | Admitting: General Surgery

## 2013-08-21 ENCOUNTER — Encounter (INDEPENDENT_AMBULATORY_CARE_PROVIDER_SITE_OTHER): Payer: Self-pay

## 2013-08-21 VITALS — BP 130/74 | HR 68 | Temp 97.2°F | Resp 14 | Ht 61.5 in | Wt 227.4 lb

## 2013-08-21 DIAGNOSIS — Z9889 Other specified postprocedural states: Secondary | ICD-10-CM

## 2013-08-21 DIAGNOSIS — E213 Hyperparathyroidism, unspecified: Secondary | ICD-10-CM

## 2013-08-21 NOTE — Progress Notes (Signed)
Procedure:  Left superior parathyroidectomy for hyperparathyroidism  Date:  07/31/2013  Pathology:  500 mg hypercellular parathyroid gland  History:  She is here for her first postoperative visit and is doing well. She is swallowing well.  Exam: General- Is in NAD.  Her voice is strong. Neck-incision is clean and intact with minimal swelling the  Assessment:  Status post neck exploration and left superior parathyroidectomy for hyperparathyroidism-clinically doing well.  Plan:  Check calcium and vitamin D levels. Diet and activities as tolerated. Return visit 2 months which time we'll check her calcium level and parathyroid hormone level.

## 2013-08-21 NOTE — Patient Instructions (Signed)
May use Mederma.  Diet and activities as tolerated.  We will call you with your lab results.

## 2013-08-22 ENCOUNTER — Telehealth (INDEPENDENT_AMBULATORY_CARE_PROVIDER_SITE_OTHER): Payer: Self-pay

## 2013-08-22 ENCOUNTER — Other Ambulatory Visit (INDEPENDENT_AMBULATORY_CARE_PROVIDER_SITE_OTHER): Payer: Self-pay

## 2013-08-22 MED ORDER — VITAMIN D2 50 MCG (2000 UT) PO TABS: 50000.0000 [IU] | ORAL_TABLET | Freq: Every day | ORAL | Status: DC

## 2013-08-22 NOTE — Telephone Encounter (Signed)
Pt made aware of lab results.  Vitamin D slightly low.  Dr. Zella Richer would like her to start retaking her Vitamin D2 50,000 units.  Pt states that he prescribed this medication for her.  Will need dosing instructions for pharmacy.

## 2013-08-22 NOTE — Telephone Encounter (Signed)
Erin with [facility] called to verify that patient should take 50,000 units daily and not weekly, which is what the normal RX would be. Please call to verify. [id number].

## 2013-08-22 NOTE — Telephone Encounter (Signed)
Vitamin D2, 50,000 units p.o. Daily.  Dispense # 30 with one refill.

## 2013-08-22 NOTE — Telephone Encounter (Signed)
Pt aware that Vit D2 50,000 u daily #30 w/ 1 refill has been called in to [facility] in [place].

## 2013-08-25 LAB — VITAMIN D PNL(25-HYDRXY+1,25-DIHY)-BLD
Vit D, 25-Hydroxy: 25 ng/mL — ABNORMAL LOW (ref 30–89)
Vitamin D 1, 25 (OH)2 Total: 66 pg/mL (ref 18–72)
Vitamin D2 1, 25 (OH)2: 40 pg/mL
Vitamin D3 1, 25 (OH)2: 26 pg/mL

## 2013-09-01 ENCOUNTER — Telehealth (INDEPENDENT_AMBULATORY_CARE_PROVIDER_SITE_OTHER): Payer: Self-pay

## 2013-09-01 NOTE — Telephone Encounter (Signed)
Revised Vit D prescription to read 50,000 u weekly per Dr. Zella Richer.  Pharmacy will make the patient aware.

## 2013-09-01 NOTE — Telephone Encounter (Signed)
Called and left message for patient to call our office RE:  Vitamin D2 rx - Prescription on hold for further verification from Dr. Zella Richer due to high units for daily verses weekly.  Will call pharmacy to verify after reviewing with Dr. Zella Richer.

## 2013-09-02 ENCOUNTER — Encounter (INDEPENDENT_AMBULATORY_CARE_PROVIDER_SITE_OTHER): Payer: Self-pay

## 2013-09-02 MED ORDER — VITAMIN D (ERGOCALCIFEROL) 1.25 MG (50000 UNIT) PO CAPS: 50000.0000 [IU] | ORAL_CAPSULE | ORAL | Status: DC

## 2013-09-23 DIAGNOSIS — I10 Essential (primary) hypertension: Secondary | ICD-10-CM | POA: Diagnosis not present

## 2013-09-23 DIAGNOSIS — G44209 Tension-type headache, unspecified, not intractable: Secondary | ICD-10-CM | POA: Diagnosis not present

## 2013-09-23 DIAGNOSIS — E7800 Pure hypercholesterolemia, unspecified: Secondary | ICD-10-CM | POA: Diagnosis not present

## 2013-09-23 DIAGNOSIS — E079 Disorder of thyroid, unspecified: Secondary | ICD-10-CM | POA: Diagnosis not present

## 2013-09-30 DIAGNOSIS — R32 Unspecified urinary incontinence: Secondary | ICD-10-CM | POA: Diagnosis not present

## 2013-09-30 DIAGNOSIS — M545 Low back pain, unspecified: Secondary | ICD-10-CM | POA: Diagnosis not present

## 2013-10-13 DIAGNOSIS — E039 Hypothyroidism, unspecified: Secondary | ICD-10-CM | POA: Diagnosis not present

## 2013-10-13 DIAGNOSIS — M109 Gout, unspecified: Secondary | ICD-10-CM | POA: Diagnosis not present

## 2013-10-13 DIAGNOSIS — Z23 Encounter for immunization: Secondary | ICD-10-CM | POA: Diagnosis not present

## 2013-10-13 DIAGNOSIS — I10 Essential (primary) hypertension: Secondary | ICD-10-CM | POA: Diagnosis not present

## 2013-10-22 ENCOUNTER — Encounter (INDEPENDENT_AMBULATORY_CARE_PROVIDER_SITE_OTHER): Payer: Self-pay | Admitting: General Surgery

## 2013-10-22 ENCOUNTER — Ambulatory Visit (INDEPENDENT_AMBULATORY_CARE_PROVIDER_SITE_OTHER): Payer: Medicare Other | Admitting: General Surgery

## 2013-10-22 VITALS — BP 138/90 | HR 74 | Resp 20 | Ht 62.0 in | Wt 228.8 lb

## 2013-10-22 DIAGNOSIS — E559 Vitamin D deficiency, unspecified: Secondary | ICD-10-CM

## 2013-10-22 NOTE — Progress Notes (Signed)
Procedure:  Left superior parathyroidectomy for hyperparathyroidism  Date:  07/31/2013  Pathology:  500 mg hypercellular parathyroid gland  History:  She is here for her second postoperative visit.  Dr. Nadara Mustard check her calcium level which was 9.3. Her parathyroid hormone level was 14. Her vitamin D level is still low at 16.7 but she forgot to take some of her vitamin D pills around Christmas time. Exam: General- Is in NAD.  Her voice is strong. Neck-incision is clean and intact with no swelling.  Assessment:  Status post neck exploration and left superior parathyroidectomy for hyperparathyroidism-clinically doing well from this standpoint.  Plan:  Take vitamin D pills to they are gone.   Will let Dr. Nadara Mustard monitor and treat Vitamin D deficiency. Return visit here as needed.

## 2013-10-22 NOTE — Patient Instructions (Signed)
Finish taking the vitamin D pills. Have Dr. Nadara Mustard check vitamin D level  early March.  Hand Surgeon:  Dr. Roseanne Kaufman, [phone].

## 2013-11-19 ENCOUNTER — Encounter (INDEPENDENT_AMBULATORY_CARE_PROVIDER_SITE_OTHER): Payer: Self-pay

## 2014-01-12 DIAGNOSIS — Z1231 Encounter for screening mammogram for malignant neoplasm of breast: Secondary | ICD-10-CM | POA: Diagnosis not present

## 2014-01-19 DIAGNOSIS — N6019 Diffuse cystic mastopathy of unspecified breast: Secondary | ICD-10-CM | POA: Diagnosis not present

## 2014-02-11 DIAGNOSIS — N200 Calculus of kidney: Secondary | ICD-10-CM | POA: Diagnosis not present

## 2014-04-13 DIAGNOSIS — Z23 Encounter for immunization: Secondary | ICD-10-CM | POA: Diagnosis not present

## 2014-04-13 DIAGNOSIS — E559 Vitamin D deficiency, unspecified: Secondary | ICD-10-CM | POA: Diagnosis not present

## 2014-04-13 DIAGNOSIS — M129 Arthropathy, unspecified: Secondary | ICD-10-CM | POA: Diagnosis not present

## 2014-04-13 DIAGNOSIS — I10 Essential (primary) hypertension: Secondary | ICD-10-CM | POA: Diagnosis not present

## 2014-04-13 DIAGNOSIS — F411 Generalized anxiety disorder: Secondary | ICD-10-CM | POA: Diagnosis not present

## 2014-04-13 DIAGNOSIS — M109 Gout, unspecified: Secondary | ICD-10-CM | POA: Diagnosis not present

## 2014-05-21 DIAGNOSIS — M545 Low back pain, unspecified: Secondary | ICD-10-CM | POA: Diagnosis not present

## 2014-10-02 ENCOUNTER — Emergency Department (HOSPITAL_COMMUNITY): Payer: Medicare Other

## 2014-10-02 ENCOUNTER — Emergency Department (HOSPITAL_COMMUNITY)
Admission: EM | Admit: 2014-10-02 | Discharge: 2014-10-03 | Disposition: A | Discharge status: Home / Self Care | Payer: Medicare Other | Source: Home / Self Care | Attending: Emergency Medicine | Admitting: Emergency Medicine

## 2014-10-02 ENCOUNTER — Encounter (HOSPITAL_COMMUNITY): Payer: Self-pay | Admitting: *Deleted

## 2014-10-02 DIAGNOSIS — A419 Sepsis, unspecified organism: Secondary | ICD-10-CM | POA: Diagnosis not present

## 2014-10-02 DIAGNOSIS — K5730 Diverticulosis of large intestine without perforation or abscess without bleeding: Secondary | ICD-10-CM | POA: Diagnosis not present

## 2014-10-02 DIAGNOSIS — R509 Fever, unspecified: Secondary | ICD-10-CM | POA: Diagnosis not present

## 2014-10-02 DIAGNOSIS — N390 Urinary tract infection, site not specified: Secondary | ICD-10-CM | POA: Diagnosis not present

## 2014-10-02 DIAGNOSIS — R7989 Other specified abnormal findings of blood chemistry: Secondary | ICD-10-CM

## 2014-10-02 DIAGNOSIS — N201 Calculus of ureter: Secondary | ICD-10-CM | POA: Diagnosis not present

## 2014-10-02 DIAGNOSIS — I9581 Postprocedural hypotension: Secondary | ICD-10-CM | POA: Diagnosis not present

## 2014-10-02 DIAGNOSIS — N200 Calculus of kidney: Secondary | ICD-10-CM

## 2014-10-02 DIAGNOSIS — N202 Calculus of kidney with calculus of ureter: Secondary | ICD-10-CM | POA: Diagnosis not present

## 2014-10-02 DIAGNOSIS — R748 Abnormal levels of other serum enzymes: Secondary | ICD-10-CM | POA: Diagnosis not present

## 2014-10-02 DIAGNOSIS — N132 Hydronephrosis with renal and ureteral calculous obstruction: Secondary | ICD-10-CM | POA: Diagnosis not present

## 2014-10-02 DIAGNOSIS — N179 Acute kidney failure, unspecified: Secondary | ICD-10-CM | POA: Diagnosis not present

## 2014-10-02 DIAGNOSIS — I10 Essential (primary) hypertension: Secondary | ICD-10-CM | POA: Diagnosis not present

## 2014-10-02 HISTORY — DX: Disorder of kidney and ureter, unspecified: N28.9

## 2014-10-02 LAB — COMPREHENSIVE METABOLIC PANEL
ALBUMIN: 4 g/dL (ref 3.5–5.2)
ALT: 16 U/L (ref 0–35)
ANION GAP: 8 (ref 5–15)
AST: 18 U/L (ref 0–37)
Alkaline Phosphatase: 73 U/L (ref 39–117)
BUN: 31 mg/dL — AB (ref 6–23)
CHLORIDE: 102 meq/L (ref 96–112)
CO2: 25 mmol/L (ref 19–32)
Calcium: 9 mg/dL (ref 8.4–10.5)
Creatinine, Ser: 1.5 mg/dL — ABNORMAL HIGH (ref 0.50–1.10)
GFR calc Af Amer: 40 mL/min — ABNORMAL LOW (ref >90)
GFR calc non Af Amer: 35 mL/min — ABNORMAL LOW (ref >90)
GLUCOSE: 125 mg/dL — AB (ref 70–99)
Potassium: 4.2 mmol/L (ref 3.5–5.1)
Sodium: 135 mmol/L (ref 135–145)
TOTAL PROTEIN: 8 g/dL (ref 6.0–8.3)
Total Bilirubin: 0.7 mg/dL (ref 0.3–1.2)

## 2014-10-02 LAB — CBC WITH DIFFERENTIAL/PLATELET
Basophils Absolute: 0 10*3/uL (ref 0.0–0.1)
Basophils Relative: 0 % (ref 0–1)
Eosinophils Absolute: 0.2 10*3/uL (ref 0.0–0.7)
Eosinophils Relative: 1 % (ref 0–5)
HCT: 39.6 % (ref 36.0–46.0)
Hemoglobin: 12.8 g/dL (ref 12.0–15.0)
LYMPHS ABS: 2.6 10*3/uL (ref 0.7–4.0)
LYMPHS PCT: 21 % (ref 12–46)
MCH: 28.8 pg (ref 26.0–34.0)
MCHC: 32.3 g/dL (ref 30.0–36.0)
MCV: 89.2 fL (ref 78.0–100.0)
MONO ABS: 1 10*3/uL (ref 0.1–1.0)
MONOS PCT: 8 % (ref 3–12)
NEUTROS PCT: 70 % (ref 43–77)
Neutro Abs: 9 10*3/uL — ABNORMAL HIGH (ref 1.7–7.7)
Platelets: 379 10*3/uL (ref 150–400)
RBC: 4.44 MIL/uL (ref 3.87–5.11)
RDW: 13.8 % (ref 11.5–15.5)
WBC: 12.8 10*3/uL — ABNORMAL HIGH (ref 4.0–10.5)

## 2014-10-02 IMAGING — CT CT ABD-PELV W/O CM
1 of 2 series · 15 of 32 positions shown, 19 images · non-contrast
Comparison: Abdominal radiograph performed [DATE], and CT of
the abdomen and pelvis from [DATE]

CLINICAL DATA: Acute onset of left lower quadrant abdominal pain
and mild nausea. Initial encounter.

EXAM:
CT ABDOMEN AND PELVIS WITHOUT CONTRAST
TECHNIQUE: Multidetector CT imaging of the abdomen and pelvis was performed
following the standard protocol without IV contrast.

[Series 2: abd/pel w/o · axial · non-contrast · 0.80mm/px · z∈[+934,+1339]mm · 15 of 89 slices shown, 19 images]
[im 4/89  soft-tissue]
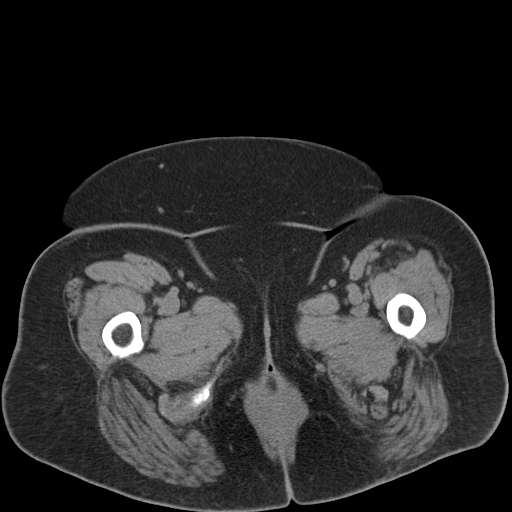
[im 4/89  bone]
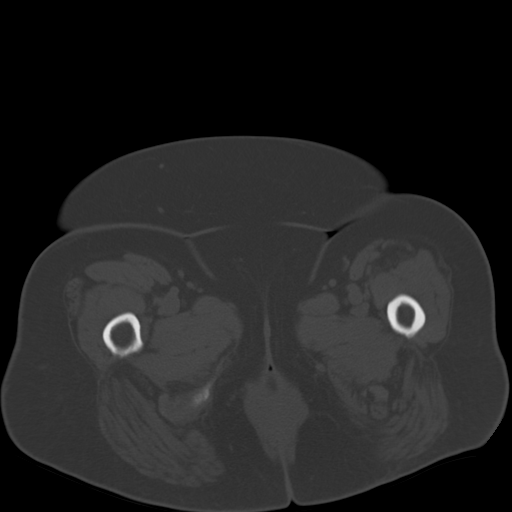
[im 12/89  soft-tissue]
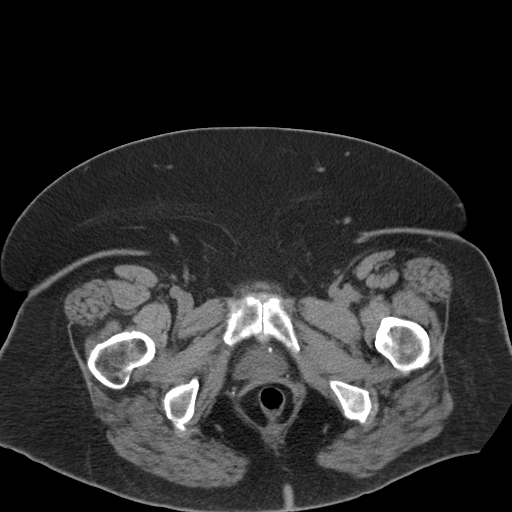
[im 20/89  soft-tissue]
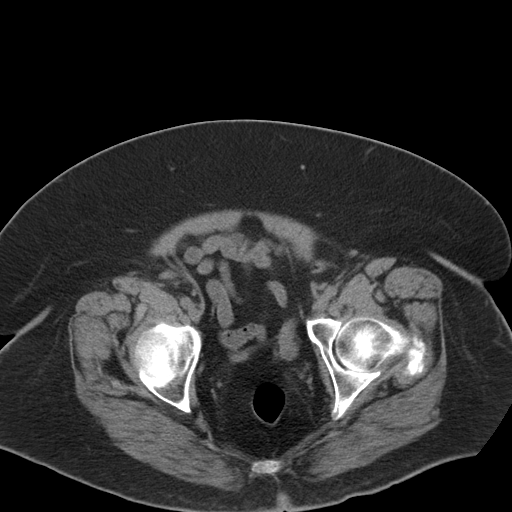
[im 23/89  soft-tissue]
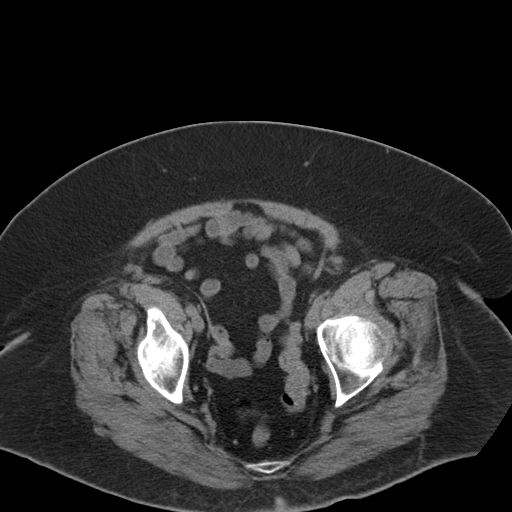
[im 31/89  soft-tissue]
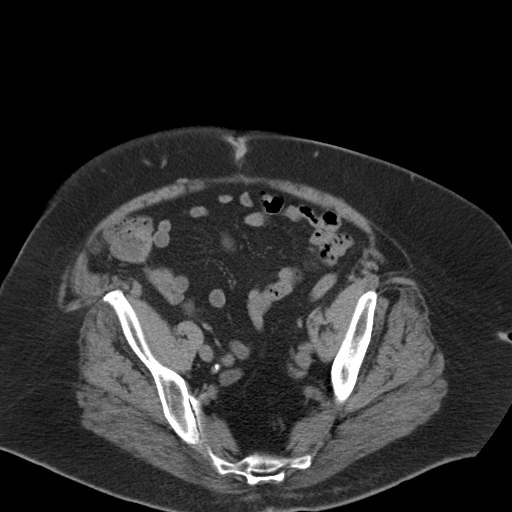
[im 39/89  soft-tissue]
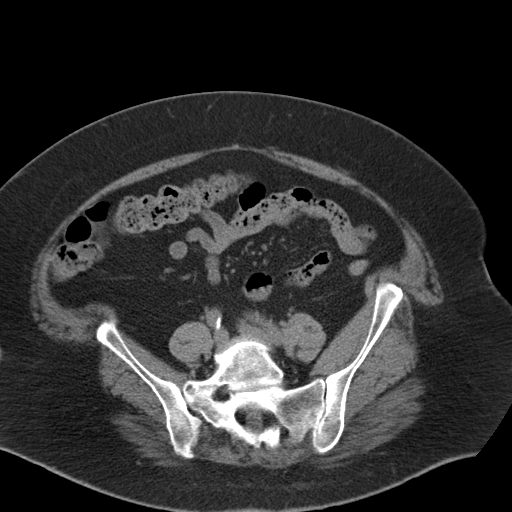
[im 46/89  soft-tissue]
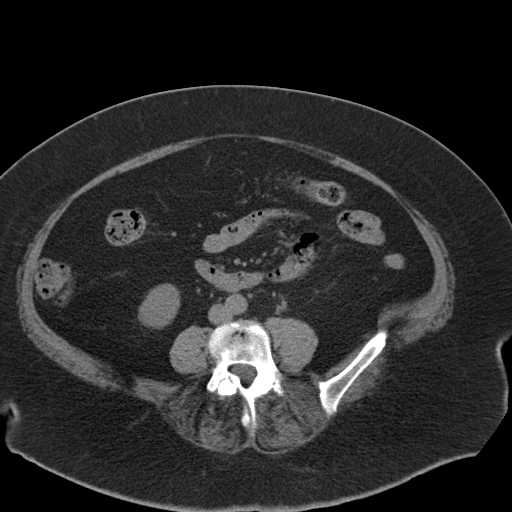
[im 50/89  soft-tissue]
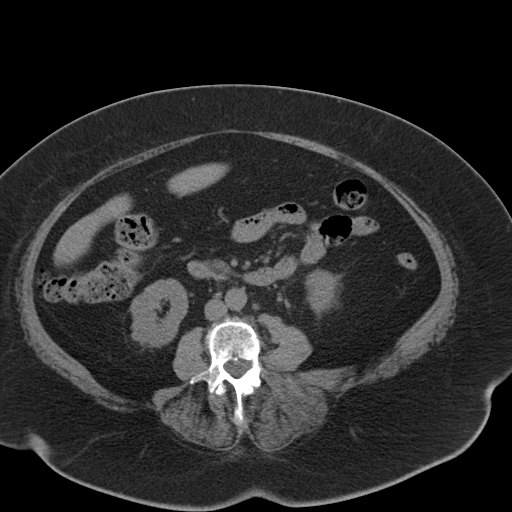
[im 58/89  soft-tissue]
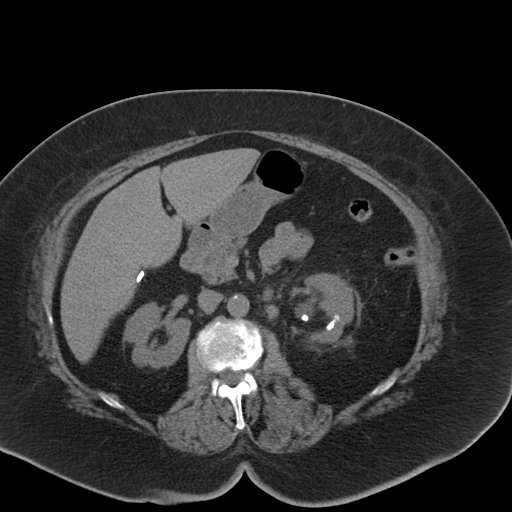
[im 58/89  bone]
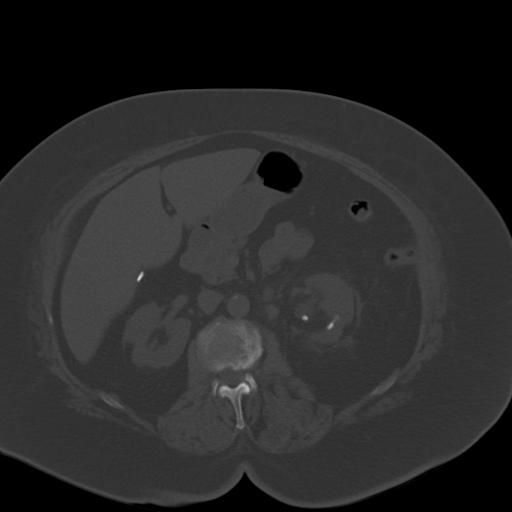
[im 66/89  soft-tissue]
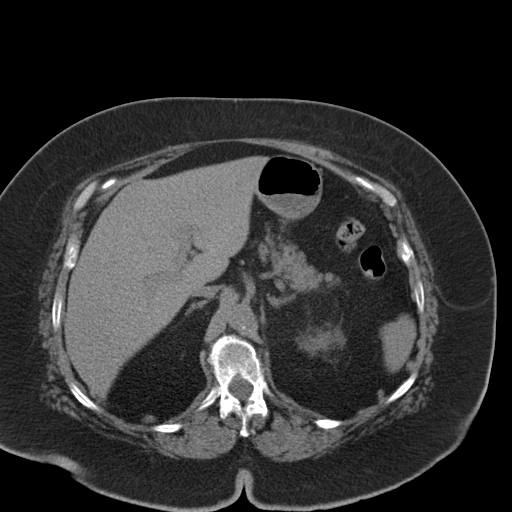
[im 69/89  soft-tissue]
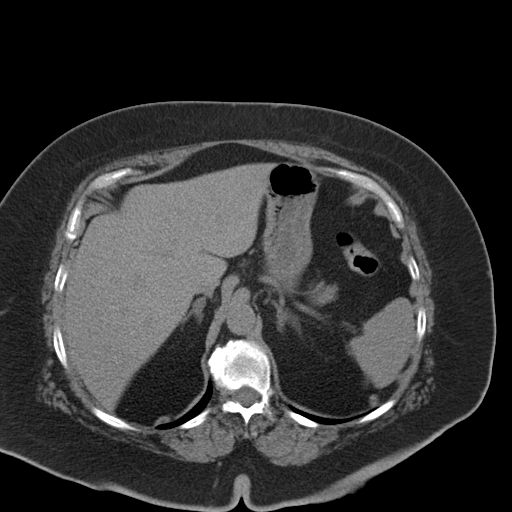
[im 73/89  lung]
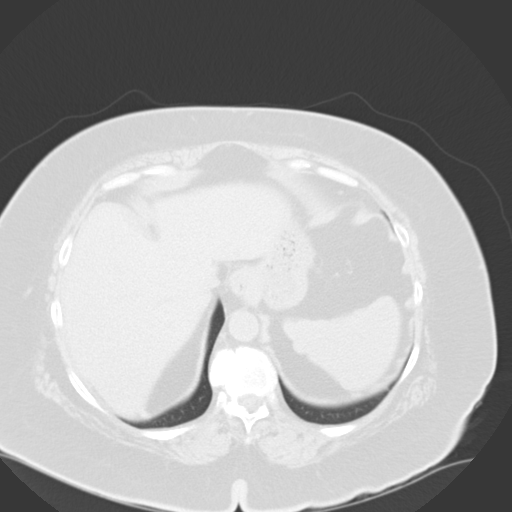
[im 77/89  soft-tissue]
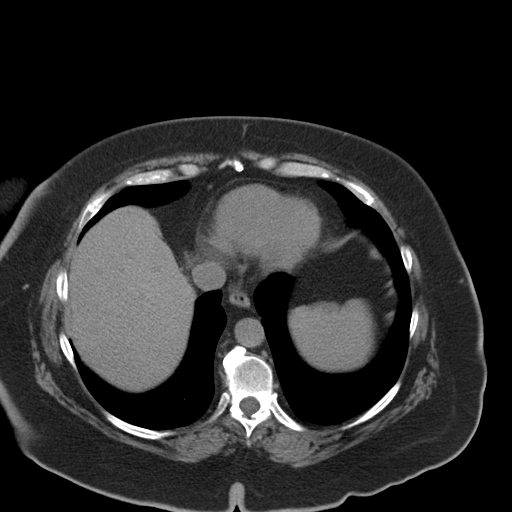
[im 77/89  lung]
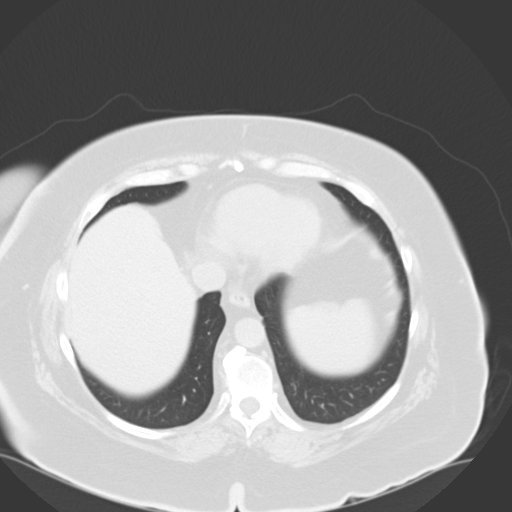
[im 81/89  lung]
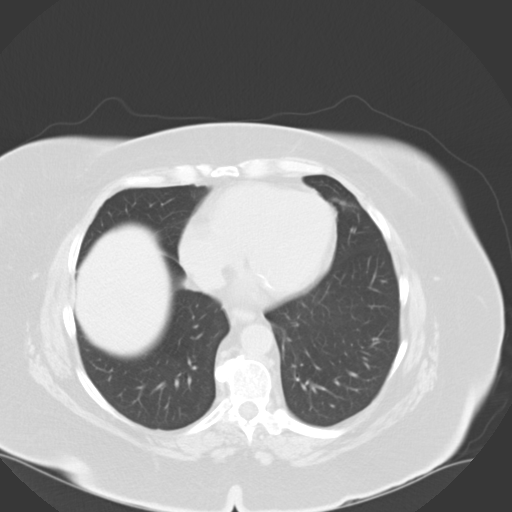
[im 85/89  soft-tissue]
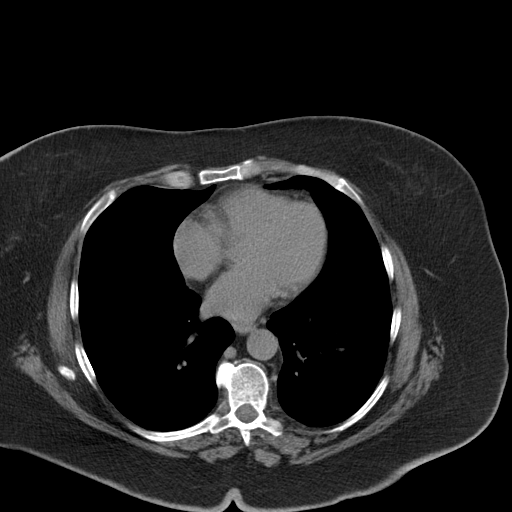
[im 85/89  lung]
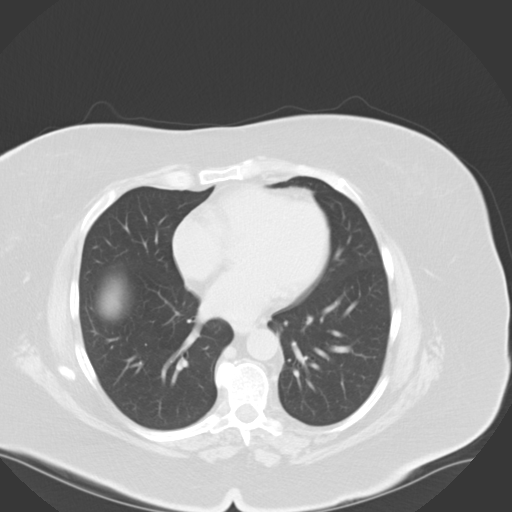

[15 of 32 positions shown; findings below may reference images not displayed]

FINDINGS: The visualized lung bases are clear.

The liver and spleen are unremarkable in appearance. The patient is
status post cholecystectomy, with clips noted along the gallbladder
fossa. The pancreas and adrenal glands are unremarkable.

There is minimal left-sided hydronephrosis, with an obstructing 6 x
4 mm stone in the proximal ureter, at the left ureteropelvic
junction. Asymmetric left-sided perinephric stranding and fluid are
seen.

Mild bilateral renal atrophy and scarring are seen. Multiple
nonobstructing bilateral renal stones are seen, more prominent on
the left, measuring up to 1.6 cm on the left.

No free fluid is identified. The small bowel is unremarkable in
appearance. The stomach is within normal limits. No acute vascular
abnormalities are seen.

The patient is status post appendectomy. Minimal diverticulosis is
noted along the proximal sigmoid colon, without evidence of
diverticulitis.

The bladder is decompressed and not well assessed. The patient is
status post hysterectomy. No suspicious adnexal masses are seen. No
inguinal lymphadenopathy is seen.

No acute osseous abnormalities are identified. Multilevel vacuum
phenomenon is noted along the lumbar spine. Underlying facet disease
is noted.
IMPRESSION: 1. Minimal left-sided hydronephrosis, with an obstructing 6 x 4 mm
stone in the proximal left ureter, at the left ureteropelvic
junction. Asymmetric left-sided perinephric stranding and fluid
noted.
2. Mild bilateral renal atrophy and scarring. Multiple
nonobstructing bilateral renal stones, more prominent on the left,
measuring up to 1.6 cm.
3. Minimal diverticulosis along the proximal sigmoid colon, without
evidence of diverticulitis.
4. Mild degenerative change noted along the lumbar spine.

## 2014-10-02 MED ORDER — OXYCODONE-ACETAMINOPHEN 5-325 MG PO TABS
1.0000 | ORAL_TABLET | Freq: Once | ORAL | Status: AC
  Administered 2014-10-02: 1 via ORAL
  Filled 2014-10-02: qty 1

## 2014-10-02 MED ORDER — ONDANSETRON 8 MG PO TBDP
8.0000 mg | ORAL_TABLET | Freq: Once | ORAL | Status: AC
  Administered 2014-10-02: 8 mg via ORAL
  Filled 2014-10-02: qty 1

## 2014-10-02 NOTE — ED Notes (Signed)
Pt states that she has a history of kidney stones; pt states that she began having left lower quad abd pain this am; pt states that this is typical kidney stone pain for her; pt states that the pain has gotten progressively worse throughout the day; pt denies burning, urgency or frequency; pt denies blood to urine; pt state "I just have left lower quad pain and mild nausea"

## 2014-10-03 ENCOUNTER — Observation Stay (HOSPITAL_COMMUNITY): Payer: Medicare Other

## 2014-10-03 ENCOUNTER — Emergency Department (HOSPITAL_COMMUNITY): Payer: Medicare Other

## 2014-10-03 ENCOUNTER — Inpatient Hospital Stay (HOSPITAL_COMMUNITY)
Admission: EM | Admit: 2014-10-03 | Discharge: 2014-10-06 | DRG: 872 | Disposition: A | Discharge status: Home / Self Care | Payer: Medicare Other | Attending: Urology | Admitting: Urology

## 2014-10-03 ENCOUNTER — Observation Stay (HOSPITAL_COMMUNITY): Payer: Medicare Other | Admitting: Anesthesiology

## 2014-10-03 ENCOUNTER — Encounter (HOSPITAL_COMMUNITY): Payer: Self-pay | Admitting: Emergency Medicine

## 2014-10-03 ENCOUNTER — Observation Stay: Admission: AD | Admit: 2014-10-03 | Payer: Medicare Other | Source: Ambulatory Visit | Admitting: Urology

## 2014-10-03 ENCOUNTER — Encounter (HOSPITAL_COMMUNITY)
Admission: EM | Disposition: A | Discharge status: Home / Self Care | Payer: Self-pay | Source: Home / Self Care | Attending: Urology

## 2014-10-03 DIAGNOSIS — R509 Fever, unspecified: Secondary | ICD-10-CM | POA: Diagnosis not present

## 2014-10-03 DIAGNOSIS — N200 Calculus of kidney: Secondary | ICD-10-CM

## 2014-10-03 DIAGNOSIS — I9581 Postprocedural hypotension: Secondary | ICD-10-CM | POA: Diagnosis not present

## 2014-10-03 DIAGNOSIS — K589 Irritable bowel syndrome without diarrhea: Secondary | ICD-10-CM | POA: Diagnosis present

## 2014-10-03 DIAGNOSIS — E785 Hyperlipidemia, unspecified: Secondary | ICD-10-CM | POA: Diagnosis present

## 2014-10-03 DIAGNOSIS — Z01818 Encounter for other preprocedural examination: Secondary | ICD-10-CM | POA: Diagnosis not present

## 2014-10-03 DIAGNOSIS — A419 Sepsis, unspecified organism: Secondary | ICD-10-CM | POA: Diagnosis not present

## 2014-10-03 DIAGNOSIS — N390 Urinary tract infection, site not specified: Secondary | ICD-10-CM | POA: Diagnosis present

## 2014-10-03 DIAGNOSIS — N179 Acute kidney failure, unspecified: Secondary | ICD-10-CM | POA: Diagnosis present

## 2014-10-03 DIAGNOSIS — Z888 Allergy status to other drugs, medicaments and biological substances status: Secondary | ICD-10-CM

## 2014-10-03 DIAGNOSIS — N202 Calculus of kidney with calculus of ureter: Secondary | ICD-10-CM | POA: Diagnosis not present

## 2014-10-03 DIAGNOSIS — E210 Primary hyperparathyroidism: Secondary | ICD-10-CM | POA: Diagnosis present

## 2014-10-03 DIAGNOSIS — N201 Calculus of ureter: Secondary | ICD-10-CM | POA: Diagnosis not present

## 2014-10-03 DIAGNOSIS — Z86010 Personal history of colonic polyps: Secondary | ICD-10-CM

## 2014-10-03 DIAGNOSIS — I10 Essential (primary) hypertension: Secondary | ICD-10-CM | POA: Diagnosis present

## 2014-10-03 DIAGNOSIS — Z8249 Family history of ischemic heart disease and other diseases of the circulatory system: Secondary | ICD-10-CM

## 2014-10-03 DIAGNOSIS — E039 Hypothyroidism, unspecified: Secondary | ICD-10-CM | POA: Diagnosis present

## 2014-10-03 DIAGNOSIS — M1990 Unspecified osteoarthritis, unspecified site: Secondary | ICD-10-CM | POA: Diagnosis present

## 2014-10-03 DIAGNOSIS — Z6838 Body mass index (BMI) 38.0-38.9, adult: Secondary | ICD-10-CM

## 2014-10-03 DIAGNOSIS — Z9049 Acquired absence of other specified parts of digestive tract: Secondary | ICD-10-CM | POA: Diagnosis present

## 2014-10-03 HISTORY — PX: CYSTOSCOPY W/ URETERAL STENT PLACEMENT: SHX1429

## 2014-10-03 LAB — COMPREHENSIVE METABOLIC PANEL
ALK PHOS: 68 U/L (ref 39–117)
ALT: 18 U/L (ref 0–35)
ANION GAP: 4 — AB (ref 5–15)
AST: 18 U/L (ref 0–37)
Albumin: 3.8 g/dL (ref 3.5–5.2)
BUN: 30 mg/dL — ABNORMAL HIGH (ref 6–23)
CALCIUM: 8.7 mg/dL (ref 8.4–10.5)
CHLORIDE: 102 meq/L (ref 96–112)
CO2: 25 mmol/L (ref 19–32)
CREATININE: 1.86 mg/dL — AB (ref 0.50–1.10)
GFR calc Af Amer: 31 mL/min — ABNORMAL LOW (ref >90)
GFR, EST NON AFRICAN AMERICAN: 27 mL/min — AB (ref >90)
Glucose, Bld: 132 mg/dL — ABNORMAL HIGH (ref 70–99)
Potassium: 4.1 mmol/L (ref 3.5–5.1)
Sodium: 131 mmol/L — ABNORMAL LOW (ref 135–145)
Total Bilirubin: 1 mg/dL (ref 0.3–1.2)
Total Protein: 7.4 g/dL (ref 6.0–8.3)

## 2014-10-03 LAB — URINALYSIS, ROUTINE W REFLEX MICROSCOPIC
BILIRUBIN URINE: NEGATIVE
Glucose, UA: NEGATIVE mg/dL
Ketones, ur: NEGATIVE mg/dL
Nitrite: NEGATIVE
PROTEIN: NEGATIVE mg/dL
SPECIFIC GRAVITY, URINE: 1.015 (ref 1.005–1.030)
Urobilinogen, UA: 0.2 mg/dL (ref 0.0–1.0)
pH: 6 (ref 5.0–8.0)

## 2014-10-03 LAB — CBC WITH DIFFERENTIAL/PLATELET
BASOS ABS: 0 10*3/uL (ref 0.0–0.1)
Basophils Relative: 0 % (ref 0–1)
EOS PCT: 0 % (ref 0–5)
Eosinophils Absolute: 0 10*3/uL (ref 0.0–0.7)
HCT: 36.8 % (ref 36.0–46.0)
Hemoglobin: 12 g/dL (ref 12.0–15.0)
LYMPHS ABS: 1.5 10*3/uL (ref 0.7–4.0)
Lymphocytes Relative: 12 % (ref 12–46)
MCH: 29.3 pg (ref 26.0–34.0)
MCHC: 32.6 g/dL (ref 30.0–36.0)
MCV: 90 fL (ref 78.0–100.0)
MONOS PCT: 9 % (ref 3–12)
Monocytes Absolute: 1.2 10*3/uL — ABNORMAL HIGH (ref 0.1–1.0)
NEUTROS ABS: 10 10*3/uL — AB (ref 1.7–7.7)
Neutrophils Relative %: 79 % — ABNORMAL HIGH (ref 43–77)
PLATELETS: 305 10*3/uL (ref 150–400)
RBC: 4.09 MIL/uL (ref 3.87–5.11)
RDW: 14 % (ref 11.5–15.5)
WBC: 12.8 10*3/uL — AB (ref 4.0–10.5)

## 2014-10-03 LAB — URINE MICROSCOPIC-ADD ON

## 2014-10-03 LAB — LACTIC ACID, PLASMA: LACTIC ACID, VENOUS: 1 mmol/L (ref 0.5–2.2)

## 2014-10-03 IMAGING — RF DG C-ARM 61-120 MIN
1 series · 1 of 1 positions shown · non-contrast
Comparison: None.

CLINICAL DATA: Cystoscopy with ureteral stent placement.

EXAM:
DG C-ARM 61-120 MIN

[Series 1: run · 1 of 1 slices shown]
[im 1/1]
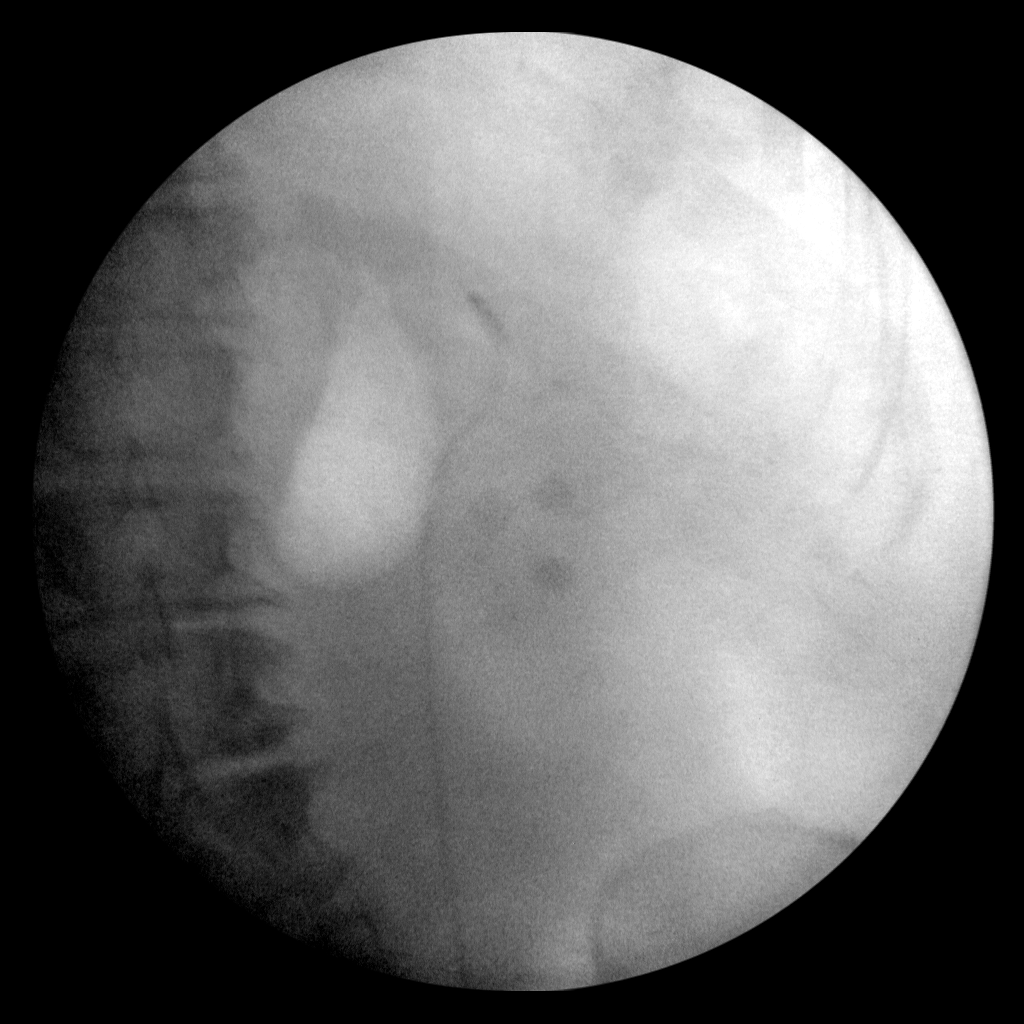

[1 of 1 positions shown; findings below may reference images not displayed]

FINDINGS: Single intraoperative spot image demonstrates the proximal and mid
portions of a left ureteral stent. Calcifications project over the
mid and lower pole of the left kidney.
IMPRESSION: Left ureteral stent placement.

Left nephrolithiasis.

## 2014-10-03 IMAGING — CR DG CHEST 2V
2 series · 2 of 2 positions shown · non-contrast
Comparison: [DATE]

CLINICAL DATA: Left kidney stone, preop.

EXAM:
CHEST  2 VIEW

[w chest pa]
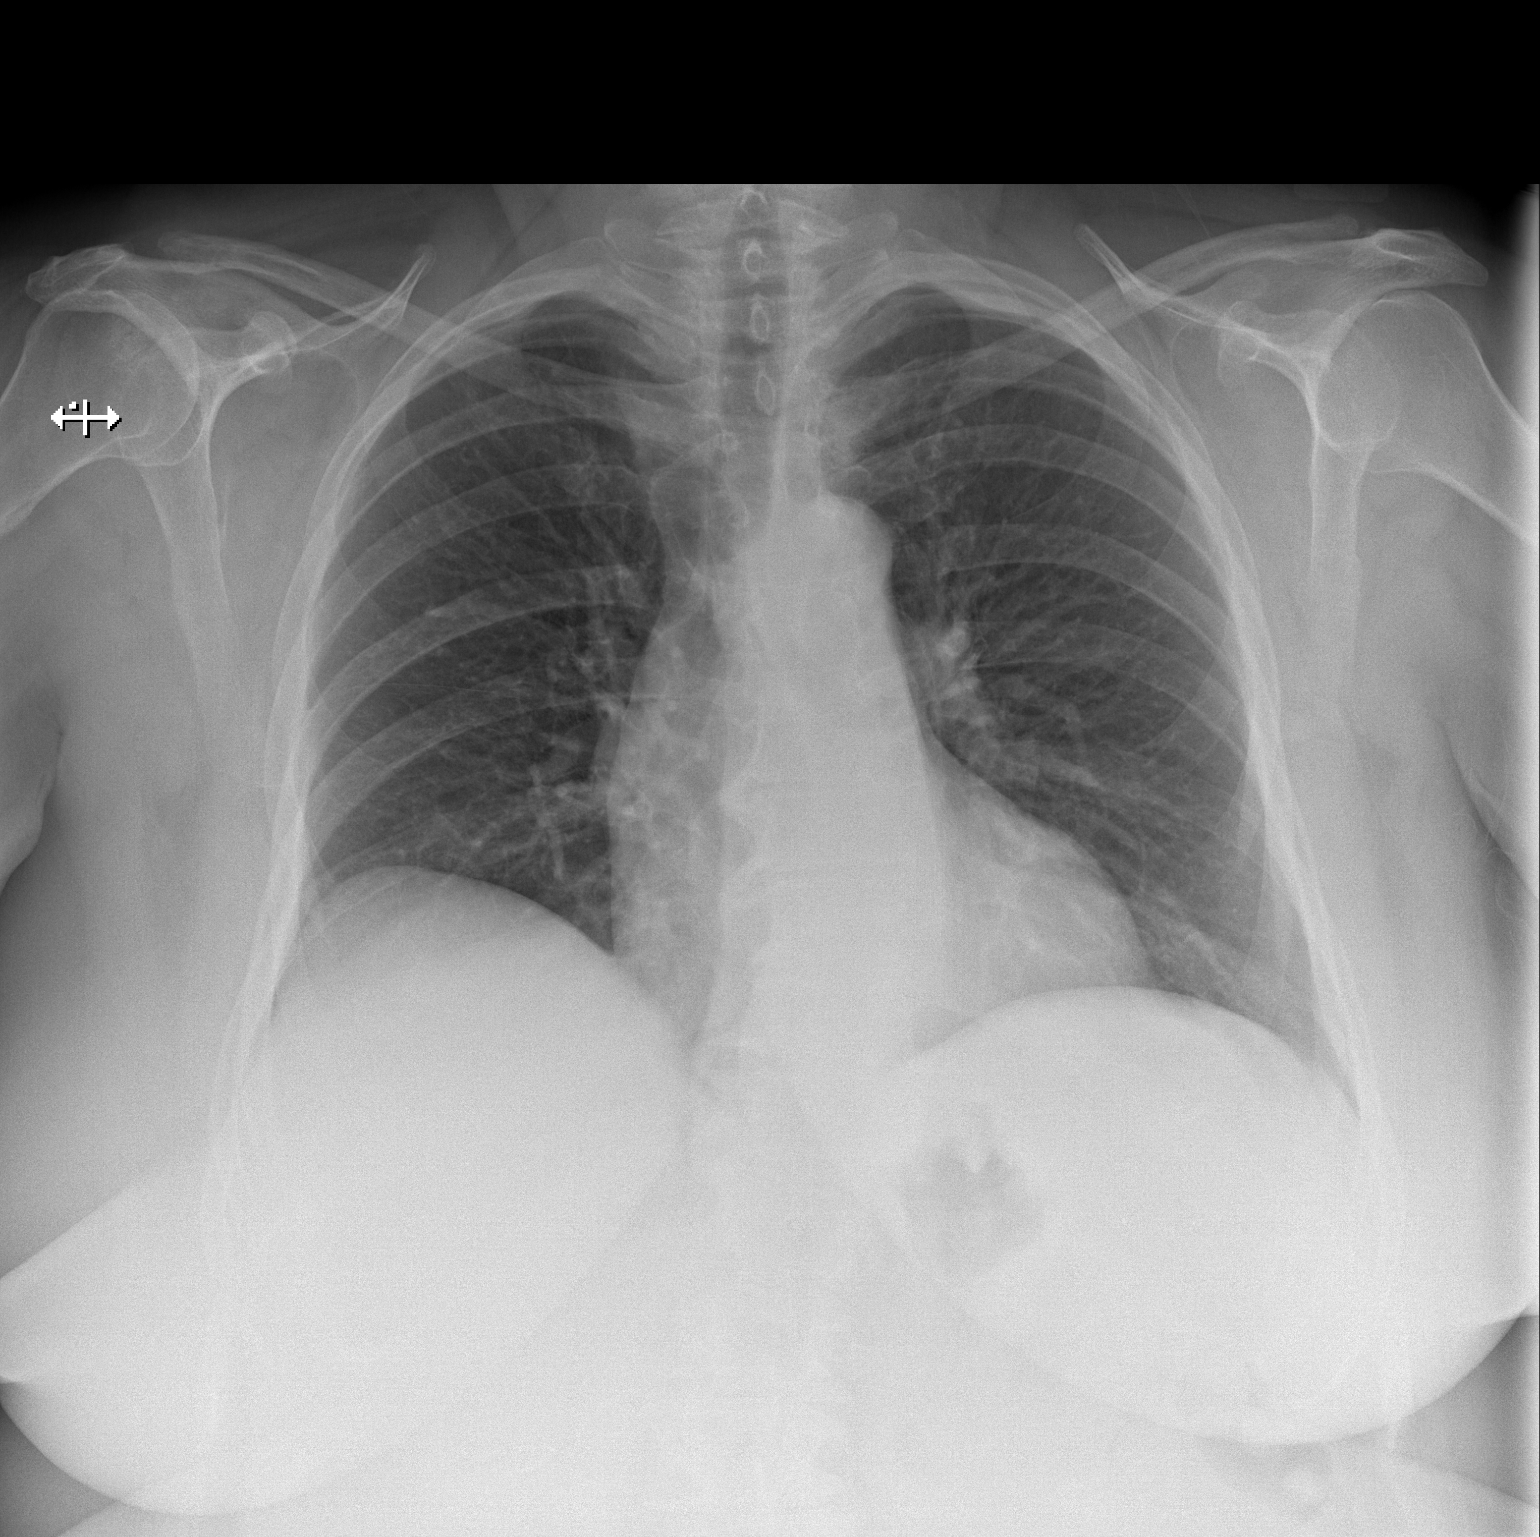

[w chest lat]
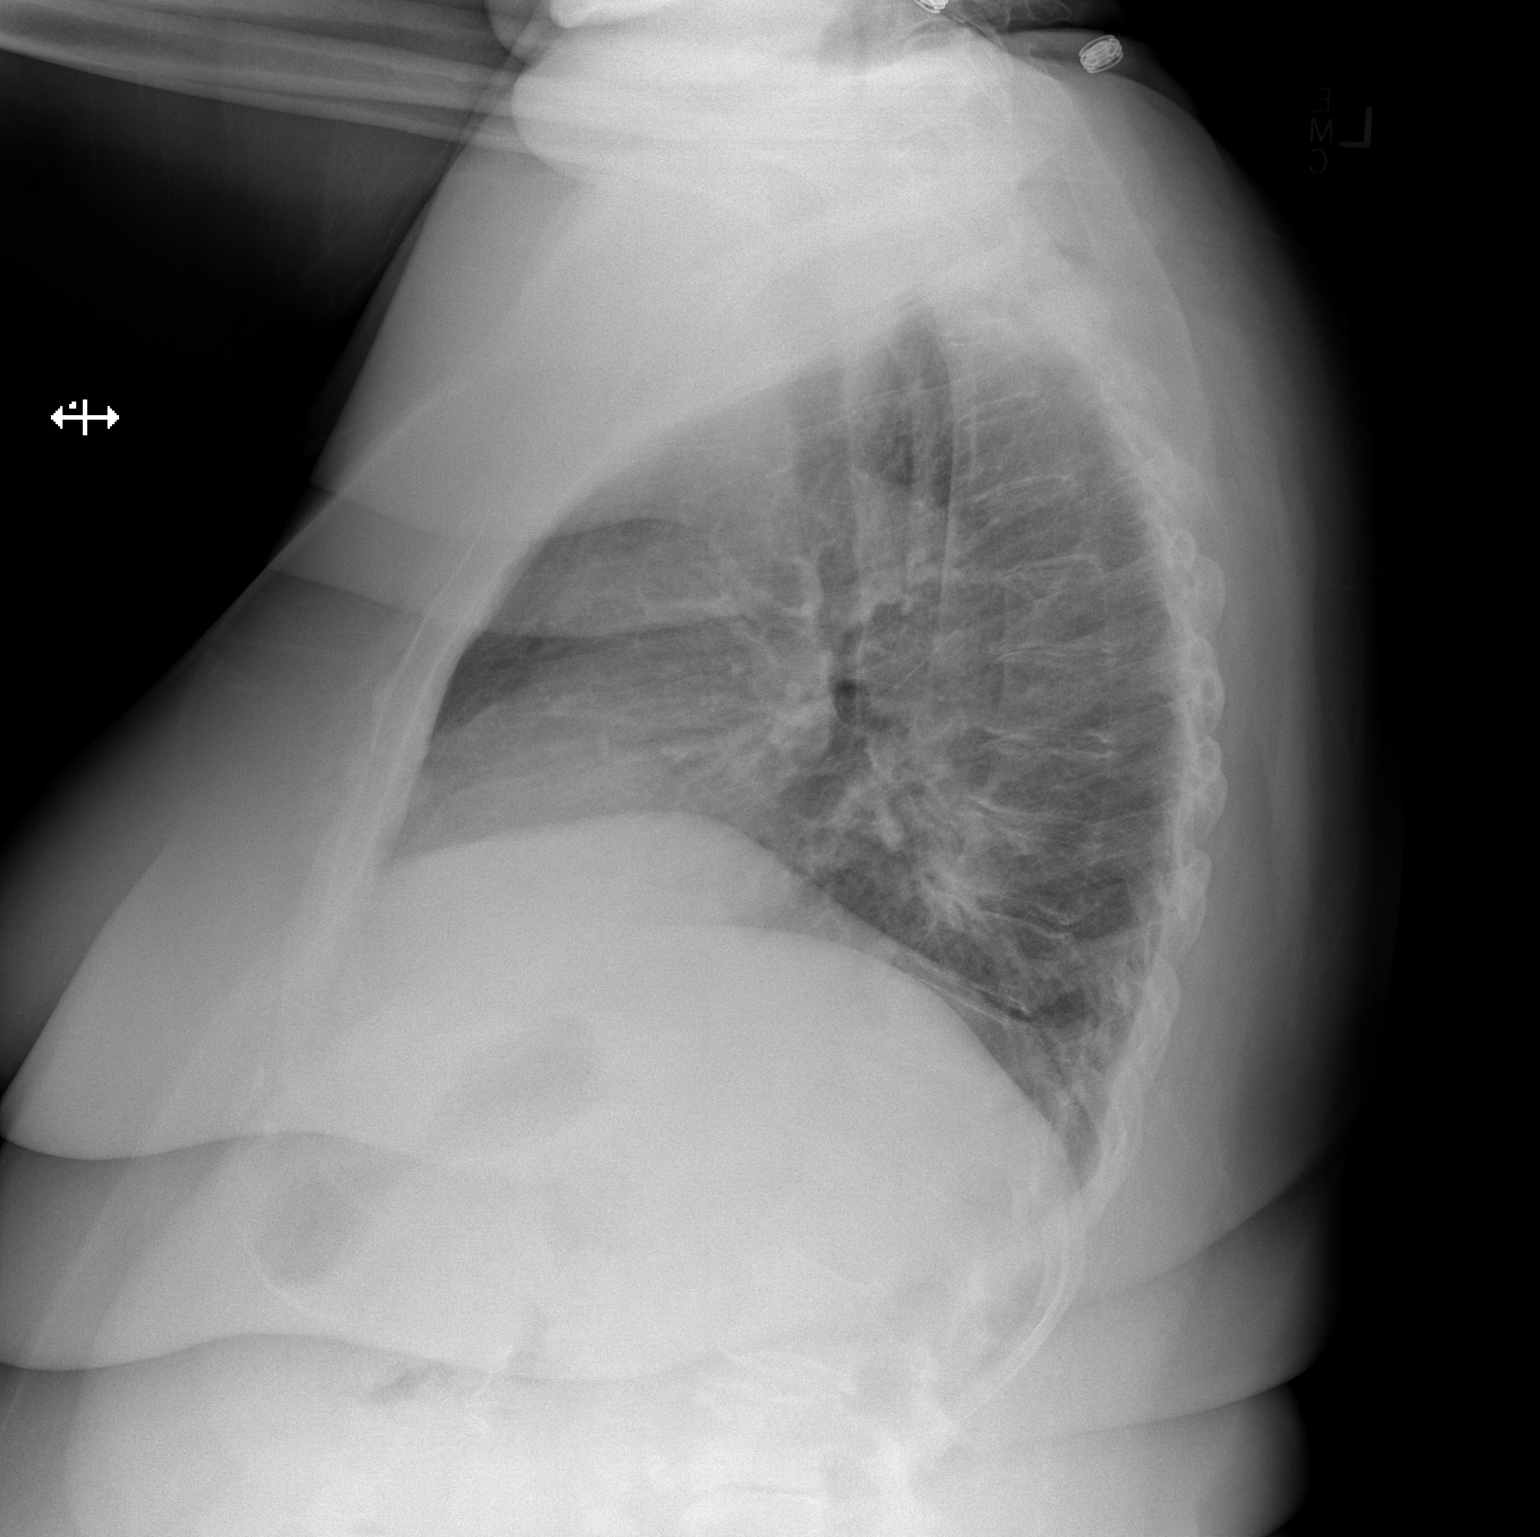

[2 of 2 positions shown; findings below may reference images not displayed]

FINDINGS: The heart size and mediastinal contours are within normal limits.
Both lungs are clear. The visualized skeletal structures are
unremarkable.
IMPRESSION: No active cardiopulmonary disease.

## 2014-10-03 IMAGING — CR DG ABDOMEN 1V
2 series · 2 of 2 positions shown · non-contrast
Comparison: CT [DATE]

CLINICAL DATA: Left ureteral calculus.  Preop.

EXAM:
ABDOMEN - 1 VIEW

[t abdomen supine (1 of 2)]
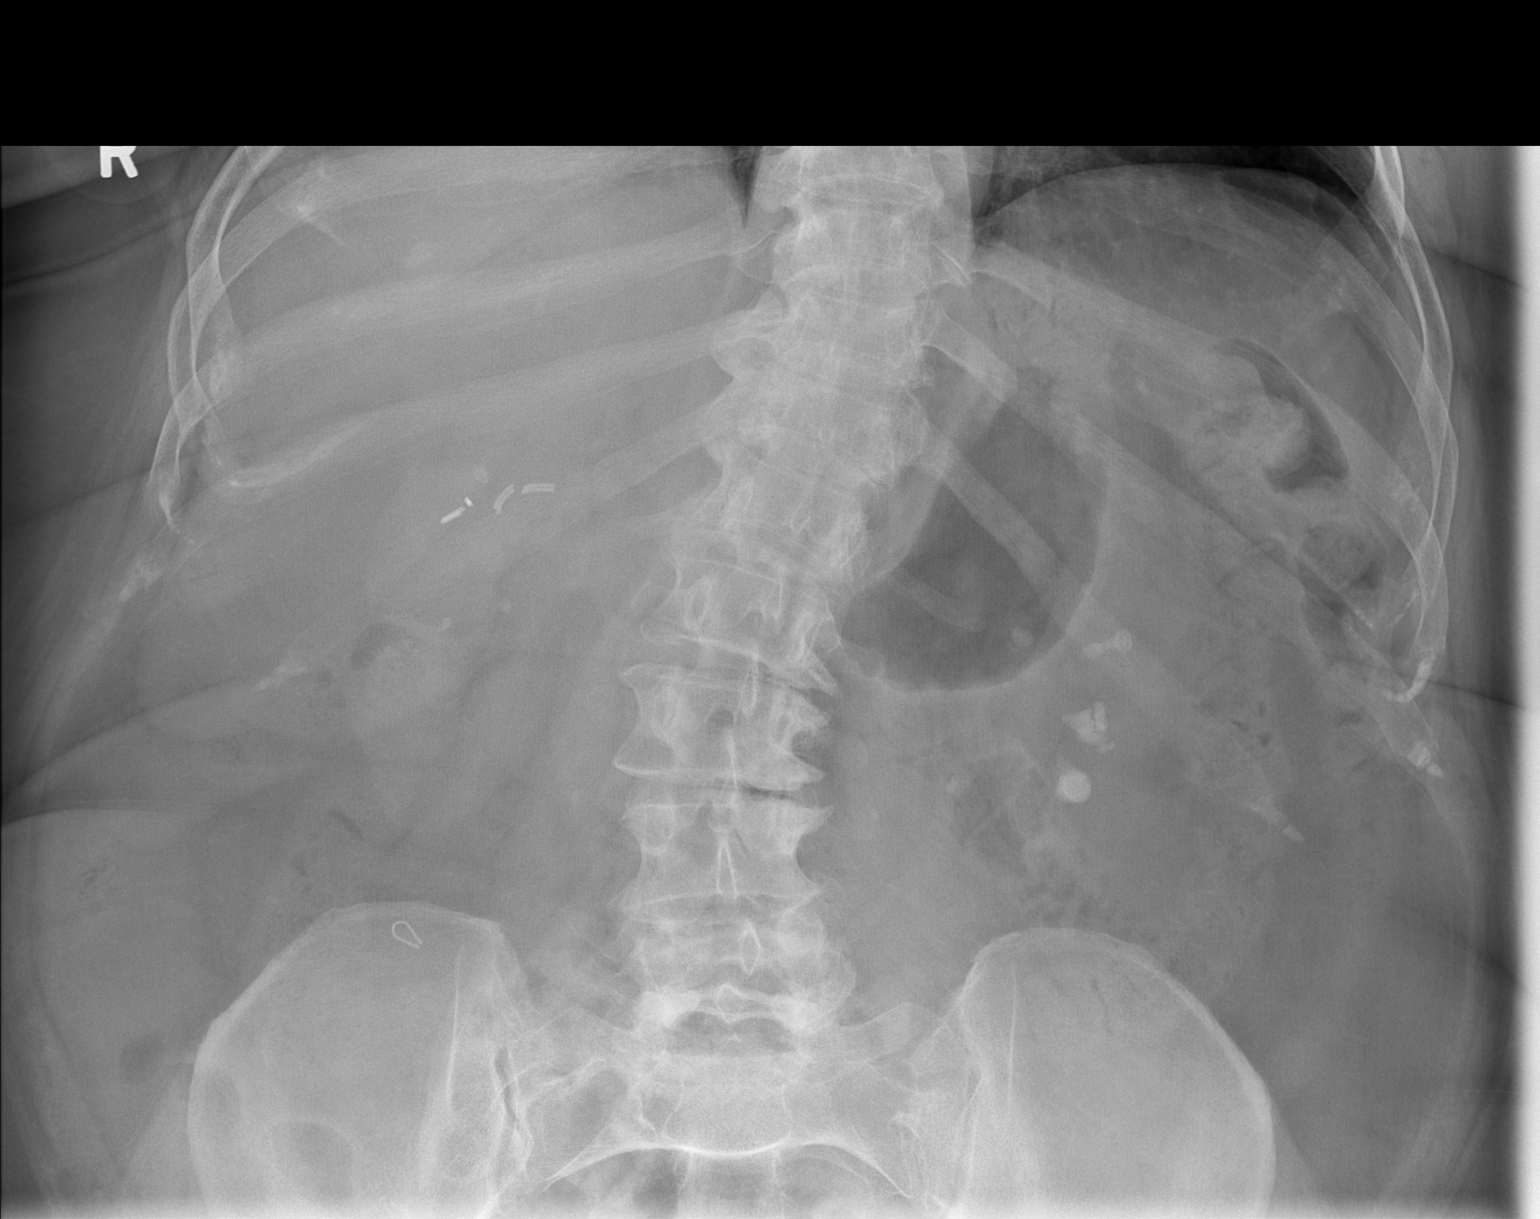

[t abdomen supine (2 of 2)]
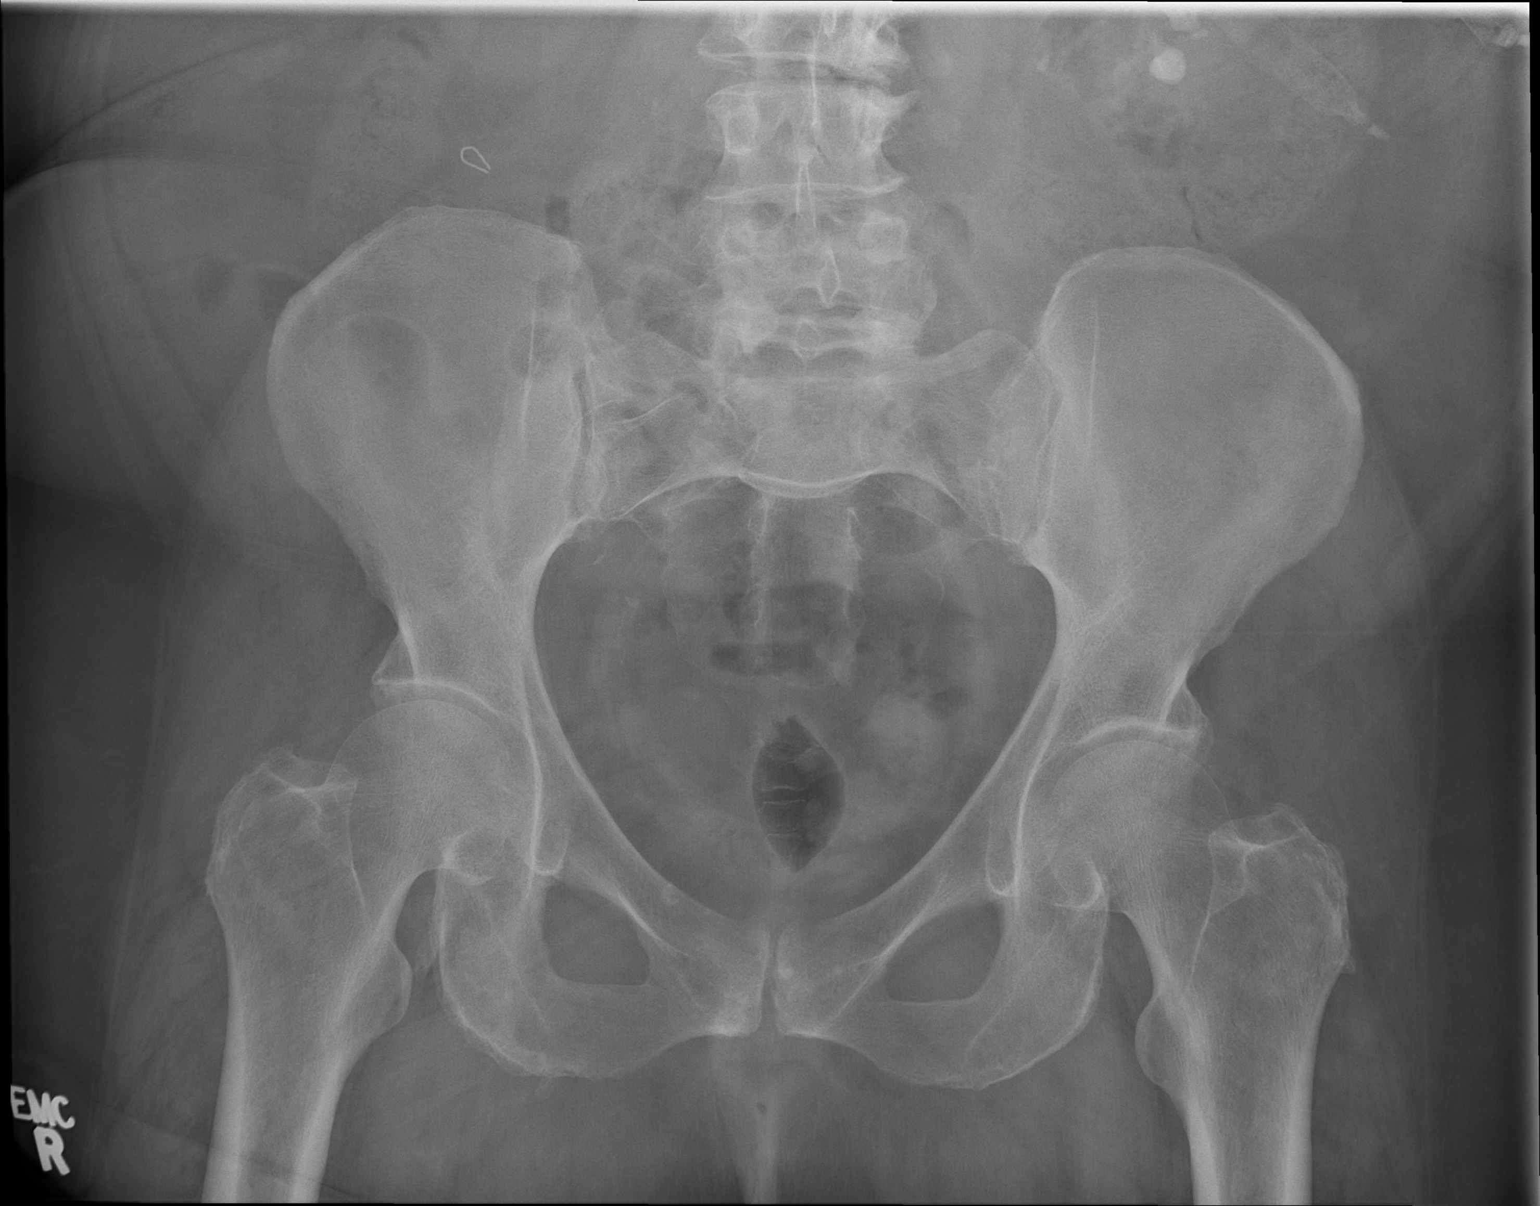

[2 of 2 positions shown; findings below may reference images not displayed]

FINDINGS: The 5 mm stone at the left ureteropelvic junction is not
significantly changed when compared to prior CT. Are multiple
additional nonobstructing calculi within the left kidney, largest
measuring 14 mm. There stone seen projecting over the right renal
fossa. No definite stones over the course of either ureter. No
bladder stones are seen. Pelvic phleboliths are unchanged. There is
a nonobstructive bowel gas pattern.
IMPRESSION: Stone at the left ureteropelvic junction measures 5 mm and is not
significantly changed. Additional stones in the right and left
kidney, similar to prior CT.

## 2014-10-03 SURGERY — CYSTOSCOPY, WITH RETROGRADE PYELOGRAM AND URETERAL STENT INSERTION
Anesthesia: General | Laterality: Left

## 2014-10-03 MED ORDER — OXYBUTYNIN CHLORIDE 5 MG PO TABS
5.0000 mg | ORAL_TABLET | Freq: Three times a day (TID) | ORAL | Status: DC | PRN
  Filled 2014-10-03: qty 1

## 2014-10-03 MED ORDER — FENTANYL CITRATE 0.05 MG/ML IJ SOLN: 25.0000 ug | INTRAMUSCULAR | Status: DC | PRN

## 2014-10-03 MED ORDER — LIDOCAINE HCL (CARDIAC) 20 MG/ML IV SOLN
INTRAVENOUS | Status: AC
  Filled 2014-10-03: qty 5

## 2014-10-03 MED ORDER — PIPERACILLIN-TAZOBACTAM 3.375 G IVPB
3.3750 g | Freq: Three times a day (TID) | INTRAVENOUS | Status: DC
  Administered 2014-10-03 – 2014-10-06 (×8): 3.375 g via INTRAVENOUS
  Filled 2014-10-03 (×9): qty 50

## 2014-10-03 MED ORDER — ONDANSETRON HCL 4 MG PO TABS: 4.0000 mg | ORAL_TABLET | Freq: Three times a day (TID) | ORAL | Status: DC | PRN

## 2014-10-03 MED ORDER — ZOLPIDEM TARTRATE 5 MG PO TABS: 5.0000 mg | ORAL_TABLET | Freq: Every evening | ORAL | Status: DC | PRN

## 2014-10-03 MED ORDER — LEVOTHYROXINE SODIUM 50 MCG PO TABS
50.0000 ug | ORAL_TABLET | Freq: Every day | ORAL | Status: DC
  Administered 2014-10-04 – 2014-10-06 (×3): 50 ug via ORAL
  Filled 2014-10-03 (×3): qty 1

## 2014-10-03 MED ORDER — EPHEDRINE SULFATE 50 MG/ML IJ SOLN
INTRAMUSCULAR | Status: DC | PRN
  Administered 2014-10-03 (×2): 5 mg via INTRAVENOUS

## 2014-10-03 MED ORDER — KCL IN DEXTROSE-NACL 20-5-0.45 MEQ/L-%-% IV SOLN
INTRAVENOUS | Status: DC
  Administered 2014-10-03 – 2014-10-05 (×3): via INTRAVENOUS
  Filled 2014-10-03 (×8): qty 1000

## 2014-10-03 MED ORDER — LACTATED RINGERS IV SOLN
INTRAVENOUS | Status: DC | PRN
  Administered 2014-10-03: 20:00:00 via INTRAVENOUS

## 2014-10-03 MED ORDER — PROPOFOL 10 MG/ML IV BOLUS
INTRAVENOUS | Status: DC | PRN
  Administered 2014-10-03: 150 mg via INTRAVENOUS

## 2014-10-03 MED ORDER — ACETAMINOPHEN 325 MG PO TABS: 650.0000 mg | ORAL_TABLET | ORAL | Status: DC | PRN

## 2014-10-03 MED ORDER — LACTATED RINGERS IV SOLN: INTRAVENOUS | Status: DC

## 2014-10-03 MED ORDER — DOCUSATE SODIUM 100 MG PO CAPS
100.0000 mg | ORAL_CAPSULE | Freq: Two times a day (BID) | ORAL | Status: DC
  Administered 2014-10-04 (×2): 100 mg via ORAL
  Filled 2014-10-03 (×6): qty 1

## 2014-10-03 MED ORDER — METOPROLOL SUCCINATE ER 50 MG PO TB24
50.0000 mg | ORAL_TABLET | Freq: Every morning | ORAL | Status: DC
  Administered 2014-10-05: 50 mg via ORAL
  Filled 2014-10-03: qty 1

## 2014-10-03 MED ORDER — SUCCINYLCHOLINE CHLORIDE 20 MG/ML IJ SOLN
INTRAMUSCULAR | Status: DC | PRN
  Administered 2014-10-03: 100 mg via INTRAVENOUS

## 2014-10-03 MED ORDER — ONDANSETRON HCL 4 MG/2ML IJ SOLN
INTRAMUSCULAR | Status: DC | PRN
  Administered 2014-10-03: 4 mg via INTRAVENOUS

## 2014-10-03 MED ORDER — OXYCODONE-ACETAMINOPHEN 5-325 MG PO TABS: 1.0000 | ORAL_TABLET | ORAL | Status: DC | PRN

## 2014-10-03 MED ORDER — FENTANYL CITRATE 0.05 MG/ML IJ SOLN
INTRAMUSCULAR | Status: DC | PRN
  Administered 2014-10-03: 50 ug via INTRAVENOUS

## 2014-10-03 MED ORDER — BISACODYL 10 MG RE SUPP: 10.0000 mg | Freq: Every day | RECTAL | Status: DC | PRN

## 2014-10-03 MED ORDER — TAMSULOSIN HCL 0.4 MG PO CAPS
0.4000 mg | ORAL_CAPSULE | Freq: Two times a day (BID) | ORAL | Status: DC
  Filled 2014-10-03 (×2): qty 1

## 2014-10-03 MED ORDER — PROMETHAZINE HCL 25 MG/ML IJ SOLN: 6.2500 mg | INTRAMUSCULAR | Status: DC | PRN

## 2014-10-03 MED ORDER — PHENYLEPHRINE HCL 10 MG/ML IJ SOLN
INTRAMUSCULAR | Status: DC | PRN
Start: 2014-10-03 — End: 2014-10-03
  Administered 2014-10-03 (×2): 50 ug via INTRAVENOUS
  Administered 2014-10-03: 100 ug via INTRAVENOUS
  Administered 2014-10-03: 50 ug via INTRAVENOUS

## 2014-10-03 MED ORDER — LOSARTAN POTASSIUM-HCTZ 100-25 MG PO TABS: 1.0000 | ORAL_TABLET | Freq: Every morning | ORAL | Status: DC

## 2014-10-03 MED ORDER — ACETAMINOPHEN 325 MG PO TABS
650.0000 mg | ORAL_TABLET | Freq: Four times a day (QID) | ORAL | Status: DC | PRN
  Administered 2014-10-03 – 2014-10-04 (×3): 650 mg via ORAL
  Filled 2014-10-03 (×3): qty 2

## 2014-10-03 MED ORDER — ONDANSETRON HCL 4 MG/2ML IJ SOLN: 4.0000 mg | INTRAMUSCULAR | Status: DC | PRN

## 2014-10-03 MED ORDER — MIDAZOLAM HCL 2 MG/2ML IJ SOLN
INTRAMUSCULAR | Status: AC
  Filled 2014-10-03: qty 2

## 2014-10-03 MED ORDER — PHENYLEPHRINE HCL 10 MG/ML IJ SOLN
INTRAMUSCULAR | Status: AC
  Filled 2014-10-03: qty 1

## 2014-10-03 MED ORDER — LIDOCAINE HCL (CARDIAC) 20 MG/ML IV SOLN
INTRAVENOUS | Status: DC | PRN
  Administered 2014-10-03: 50 mg via INTRAVENOUS

## 2014-10-03 MED ORDER — PROPOFOL 10 MG/ML IV BOLUS
INTRAVENOUS | Status: AC
  Filled 2014-10-03: qty 20

## 2014-10-03 MED ORDER — OXYCODONE-ACETAMINOPHEN 5-325 MG PO TABS
1.0000 | ORAL_TABLET | ORAL | Status: DC | PRN
Start: 2014-10-03 — End: 2014-10-06

## 2014-10-03 MED ORDER — TAMSULOSIN HCL 0.4 MG PO CAPS: 0.4000 mg | ORAL_CAPSULE | Freq: Two times a day (BID) | ORAL | Status: DC

## 2014-10-03 MED ORDER — SODIUM CHLORIDE 0.9 % IR SOLN
Status: DC | PRN
  Administered 2014-10-03: 3000 mL via INTRAVESICAL

## 2014-10-03 MED ORDER — HYDROCHLOROTHIAZIDE 25 MG PO TABS
25.0000 mg | ORAL_TABLET | Freq: Every day | ORAL | Status: DC
  Administered 2014-10-05: 25 mg via ORAL
  Filled 2014-10-03 (×3): qty 1

## 2014-10-03 MED ORDER — HYDROMORPHONE HCL 1 MG/ML IJ SOLN
0.5000 mg | INTRAMUSCULAR | Status: DC | PRN
  Filled 2014-10-03: qty 1

## 2014-10-03 MED ORDER — OXYCODONE-ACETAMINOPHEN 5-325 MG PO TABS
1.0000 | ORAL_TABLET | Freq: Once | ORAL | Status: AC
Start: 2014-10-03 — End: 2014-10-03
  Administered 2014-10-03: 1 via ORAL
  Filled 2014-10-03: qty 1

## 2014-10-03 MED ORDER — FENTANYL CITRATE 0.05 MG/ML IJ SOLN
INTRAMUSCULAR | Status: AC
  Filled 2014-10-03: qty 2

## 2014-10-03 MED ORDER — MIDAZOLAM HCL 5 MG/5ML IJ SOLN: INTRAMUSCULAR | Status: DC | PRN

## 2014-10-03 MED ORDER — LOSARTAN POTASSIUM 50 MG PO TABS
100.0000 mg | ORAL_TABLET | Freq: Every day | ORAL | Status: DC
  Administered 2014-10-05: 100 mg via ORAL
  Filled 2014-10-03 (×3): qty 2

## 2014-10-03 SURGICAL SUPPLY — 13 items
BAG URO CATCHER STRL LF (DRAPE) ×3 IMPLANT
CATH URET 5FR 28IN OPEN ENDED (CATHETERS) ×3 IMPLANT
DRAPE CAMERA CLOSED 9X96 (DRAPES) ×3 IMPLANT
GLOVE SURG SS PI 6.5 STRL IVOR (GLOVE) ×3 IMPLANT
GLOVE SURG SS PI 8.0 STRL IVOR (GLOVE) ×3 IMPLANT
GOWN STRL REUS W/TWL XL LVL3 (GOWN DISPOSABLE) ×3 IMPLANT
GUIDEWIRE STR DUAL SENSOR (WIRE) ×3 IMPLANT
PACK CYSTO (CUSTOM PROCEDURE TRAY) ×3 IMPLANT
STENT URET 6FRX24 CONTOUR (STENTS) ×3 IMPLANT
SUT FIBERWIRE 4-0 18 TAPR NDL (SUTURE) ×3
SUTURE FIBERWR 4-0 18 TAPR NDL (SUTURE) ×1 IMPLANT
TUBING CONNECTING 10 (TUBING) ×2 IMPLANT
TUBING CONNECTING 10' (TUBING) ×1

## 2014-10-03 NOTE — ED Notes (Signed)
Pt in radiology at present time.

## 2014-10-03 NOTE — Transfer of Care (Signed)
Immediate Anesthesia Transfer of Care Note  Patient: Natasha Warren  Procedure(s) Performed: Procedure(s) (LRB): CYSTOSCOPY WITH URETERAL STENT PLACEMENT (Left)  Patient Location: PACU  Anesthesia Type: General  Level of Consciousness: sedated, patient cooperative and responds to stimulation  Airway & Oxygen Therapy: Patient Spontanous Breathing and Patient connected to face mask oxgen  Post-op Assessment: Report given to PACU RN and Post -op Vital signs reviewed and stable  Post vital signs: Reviewed and stable  Complications: No apparent anesthesia complications

## 2014-10-03 NOTE — ED Provider Notes (Signed)
CSN: [id number]     Arrival date & time 10/02/14  1946 History   First MD Initiated Contact with Patient 10/02/14 2235     Chief Complaint  Patient presents with  . Abdominal Pain     (Consider location/radiation/quality/duration/timing/severity/associated sxs/prior Treatment) HPI   This is a 69 year old female with a past medical history of nephrolithiasis who presents emergency Department with chief complaint with left lower quadrant abdominal pain. 2. History of nephrolithiasis. She complains of pain in the left lower quadrant consistent with all of her previous kidney stones. She states it began yesterday. She took a Norco and had some relief. Patient states that today her pain became more intense. She took a Norco this evening and did not have relief. Patient came to the emergency department for pain control. Evaluation. She received 1 Percocet while here and has no pain at this time. She denies any urinary symptoms. She denies back pain, nausea, vomiting. She describes her pain as constant, achy, colicky. She denies any constipation or diarrhea. She denies a history of diverticulitis.  Past Medical History  Diagnosis Date  . Hypertension   . Thyroid disorder   . Irritable bowel syndrome   . Seasonal allergies   . Hypothyroidism   . Colon polyps   . Arthritis   . Hyperlipidemia   . Urolithiasis   . History of gout   . Nocturia   . Hyperparathyroidism, primary   . Renal disorder     kidney stones   Past Surgical History  Procedure Laterality Date  . Bladder tack  09/1984  . Lithotripsy  06/1995  . 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 Rotor cuff  2011    Right Shoulder  . Knee arthroscopy  12/2010    Left knee  . Colonoscopy  06/2006    This was her second one  . Appendectomy  1954  . Cholecystectomy  01/1999  . Colonoscopy  07/12/2011    Procedure: COLONOSCOPY;  Surgeon: Rogene Houston, MD;  Location: [facility]  END[facility] SUITE;  Service: Endoscopy;  Laterality: N/A;  10:30 am  . Abdominal hysterectomy    . Thyroidectomy N/A 07/31/2013    Procedure: PARATHYROIDECTOMY;  Surgeon: Odis Hollingshead, MD;  Location: [facility] ORS;  Service: General;  Laterality: N/A;  . Thyroid exploration N/A 07/31/2013    Procedure: neck EXPLORATION;  Surgeon: Odis Hollingshead, MD;  Location: [facility] ORS;  Service: General;  Laterality: N/A;   Family History  Problem Relation Age of Onset  . Hypertension Mother   . Arthritis Mother   . Hypertension Father   . Healthy Brother   . Healthy Daughter   . Healthy Daughter   . Colon cancer Neg Hx    History  Substance Use Topics  . Smoking status: Never Smoker   . Smokeless tobacco: Never Used  . Alcohol Use: Yes     Comment: Very Rarely 1/2 glass of beer   OB History    No data available     Review of Systems Ten systems reviewed and are negative for acute change, except as noted in the HPI.     Allergies  Ace inhibitors  Home Medications   Prior to Admission medications   Medication Sig Start Date End Date Taking? Authorizing Provider  acetaminophen (TYLENOL) 500 MG tablet Take 1,000 mg by mouth every 6 (six) hours as needed for pain.  Historical Provider, MD  colchicine (COLCRYS) 0.6 MG tablet Take 0.6 mg by mouth daily as needed (Gout flare ups).     Historical Provider, MD  levothyroxine (SYNTHROID, LEVOTHROID) 50 MCG tablet Take 50 mcg by mouth daily before breakfast.     Historical Provider, MD  losartan-hydrochlorothiazide (HYZAAR) 100-25 MG per tablet Take 1 tablet by mouth every morning.     Historical Provider, MD  metoprolol succinate (TOPROL-XL) 50 MG 24 hr tablet Take 50 mg by mouth every morning. Take with or immediately following a meal.    Historical Provider, MD  Vitamin D, Ergocalciferol, (DRISDOL) 50000 UNITS CAPS capsule Take 1 capsule (50,000 Units total) by mouth every 7 (seven) days. 09/02/13   Jackolyn Confer, MD   BP 123/82 mmHg  Pulse 78   Temp(Src) 98.7 F (37.1 C) (Oral)  Resp 20  SpO2 96% Physical Exam  Constitutional: She is oriented to person, place, and time. She appears well-developed and well-nourished. No distress.  HENT:  Head: Normocephalic and atraumatic.  Eyes: Conjunctivae are normal. No scleral icterus.  Neck: Normal range of motion.  Cardiovascular: Normal rate, regular rhythm and normal heart sounds.  Exam reveals no gallop and no friction rub.   No murmur heard. Pulmonary/Chest: Effort normal and breath sounds normal. No respiratory distress.  Abdominal: Soft. Bowel sounds are normal. She exhibits no distension and no mass. There is no tenderness. There is no guarding.    Neurological: She is alert and oriented to person, place, and time.  Skin: Skin is warm and dry. She is not diaphoretic.  Nursing note and vitals reviewed.   ED Course  Procedures (including critical care time) Labs Review Labs Reviewed  CBC WITH DIFFERENTIAL - Abnormal; Notable for the following:    WBC 12.8 (*)    Neutro Abs 9.0 (*)    All other components within normal limits  COMPREHENSIVE METABOLIC PANEL - Abnormal; Notable for the following:    Glucose, Bld 125 (*)    BUN 31 (*)    Creatinine, Ser 1.50 (*)    GFR calc non Af Amer 35 (*)    GFR calc Af Amer 40 (*)    All other components within normal limits  URINALYSIS, ROUTINE W REFLEX MICROSCOPIC    Imaging Review Ct Abdomen Pelvis Wo Contrast  10/03/2014   CLINICAL DATA:  Acute onset of left lower quadrant abdominal pain and mild nausea. Initial encounter.  EXAM: CT ABDOMEN AND PELVIS WITHOUT CONTRAST  TECHNIQUE: Multidetector CT imaging of the abdomen and pelvis was performed following the standard protocol without IV contrast.  COMPARISON:  Abdominal radiograph performed 02/11/2014, and CT of the abdomen and pelvis from 02/02/2004  FINDINGS: The visualized lung bases are clear.  The liver and spleen are unremarkable in appearance. The patient is status post  cholecystectomy, with clips noted along the gallbladder fossa. The pancreas and adrenal glands are unremarkable.  There is minimal left-sided hydronephrosis, with an obstructing 6 x 4 mm stone in the proximal ureter, at the left ureteropelvic junction. Asymmetric left-sided perinephric stranding and fluid are seen.  Mild bilateral renal atrophy and scarring are seen. Multiple nonobstructing bilateral renal stones are seen, more prominent on the left, measuring up to 1.6 cm on the left.  No free fluid is identified. The small bowel is unremarkable in appearance. The stomach is within normal limits. No acute vascular abnormalities are seen.  The patient is status post appendectomy. Minimal diverticulosis is noted along the proximal sigmoid colon, without evidence of  diverticulitis.  The bladder is decompressed and not well assessed. The patient is status post hysterectomy. No suspicious adnexal masses are seen. No inguinal lymphadenopathy is seen.  No acute osseous abnormalities are identified. Multilevel vacuum phenomenon is noted along the lumbar spine. Underlying facet disease is noted.  IMPRESSION: 1. Minimal left-sided hydronephrosis, with an obstructing 6 x 4 mm stone in the proximal left ureter, at the left ureteropelvic junction. Asymmetric left-sided perinephric stranding and fluid noted. 2. Mild bilateral renal atrophy and scarring. Multiple nonobstructing bilateral renal stones, more prominent on the left, measuring up to 1.6 cm. 3. Minimal diverticulosis along the proximal sigmoid colon, without evidence of diverticulitis. 4. Mild degenerative change noted along the lumbar spine.   Electronically Signed   By: Garald Balding M.D.   On: 10/03/2014 00:00     EKG Interpretation None      MDM   Final diagnoses:  None    12:45 AM BP 117/77 mmHg  Pulse 75  Temp(Src) 97.8 F (36.6 C) (Oral)  Resp 19  SpO2 97% Patient with left nephrolithiasis on CT scan. There is some hydronephrosis and  perinephric stranding. She has no CVA tenderness. She does not appear febrile or have urinary tract infection. The patient states that her pain is still well controlled on 1 Percocet. We'll discharge the patient today with Percocet, Flomax, antinausea medication. Discussed return precautions with the patient. She is aware that she has a slight elevation in her serum creatinine and needs to have it rechecked on Monday with her primary care physician. Patient is also aware that she must follow up with [facility] as soon as possible. The patient lives in [place], [place] and got a ride from her friends. She needs to leave and feels safe and comfortable doing this at this time.    Margarita Mail, PA-C 10/03/14 [id number]  Pamella Pert, MD 10/04/14 [phone]

## 2014-10-03 NOTE — Anesthesia Preprocedure Evaluation (Addendum)
Anesthesia Evaluation  Patient identified by MRN, date of birth, ID band Patient awake    Reviewed: Allergy & Precautions, NPO status , Patient's Chart, lab work & pertinent test results  Airway Mallampati: II  TM Distance: >3 FB Neck ROM: Full    Dental no notable dental hx.    Pulmonary neg pulmonary ROS,  breath sounds clear to auscultation  Pulmonary exam normal       Cardiovascular hypertension, Pt. on medications and Pt. on home beta blockers Rhythm:Regular Rate:Normal  CXR and ECG unremarkable.   Neuro/Psych negative neurological ROS  negative psych ROS   GI/Hepatic negative GI ROS, Neg liver ROS,   Endo/Other  Hypothyroidism Morbid obesity  Renal/GU Renal disease  negative genitourinary   Musculoskeletal  (+) Arthritis -,   Abdominal (+) + obese,   Peds negative pediatric ROS (+)  Hematology negative hematology ROS (+)   Anesthesia Other Findings   Reproductive/Obstetrics negative OB ROS                           Anesthesia Physical Anesthesia Plan  ASA: III and emergent  Anesthesia Plan: General   Post-op Pain Management:    Induction: Intravenous  Airway Management Planned: Oral ETT  Additional Equipment:   Intra-op Plan:   Post-operative Plan: Extubation in OR  Informed Consent: I have reviewed the patients History and Physical, chart, labs and discussed the procedure including the risks, benefits and alternatives for the proposed anesthesia with the patient or authorized representative who has indicated his/her understanding and acceptance.   Dental advisory given  Plan Discussed with: CRNA  Anesthesia Plan Comments: (NPO after 1200. Nauseated today. Plan ETT)      Anesthesia Quick Evaluation

## 2014-10-03 NOTE — ED Notes (Signed)
Bed: WA07 Expected date:  Expected time:  Means of arrival:  Comments: Hold for T2

## 2014-10-03 NOTE — Anesthesia Postprocedure Evaluation (Signed)
  Anesthesia Post-op Note  Patient: Natasha Warren  Procedure(s) Performed: Procedure(s) (LRB): CYSTOSCOPY WITH URETERAL STENT PLACEMENT (Left)  Patient Location: PACU  Anesthesia Type: General  Level of Consciousness: awake and alert   Airway and Oxygen Therapy: Patient Spontanous Breathing  Post-op Pain: mild  Post-op Assessment: Post-op Vital signs reviewed, Patient's Cardiovascular Status Stable, Respiratory Function Stable, Patent Airway and No signs of Nausea or vomiting  Last Vitals:  Filed Vitals:   10/03/14 2145  BP: 95/58  Pulse: 74  Temp: 37.1 C  Resp: 18    Post-op Vital Signs: stable   Complications: No apparent anesthesia complications

## 2014-10-03 NOTE — ED Notes (Signed)
Per OR will start infusions in pre OP.

## 2014-10-03 NOTE — ED Notes (Addendum)
Pt from home c/o fever  that started today. She was seen here yesterday for Kidney stone. Dr. Jeffie Pollock told pt to come here and stent may be placed.

## 2014-10-03 NOTE — ED Notes (Signed)
Per OR transfer pt 1915 for surgery.

## 2014-10-03 NOTE — Op Note (Signed)
Natasha Warren, COMINS NO.:  [id number]  MEDICAL RECORD NO.:  [id number]  LOCATION:  [id number]                         FACILITY:  [facility]  PHYSICIAN:  Marshall Cork. Jeffie Pollock, M.D.    DATE OF BIRTH:  [birth year]  DATE OF PROCEDURE:  10/03/2014 DATE OF DISCHARGE:                              OPERATIVE REPORT   PROCEDURE:  Cystoscopy, insertion of left double-J stent.  PREOPERATIVE DIAGNOSIS:  Left proximal ureteral stone with sepsis from urinary infection.  POSTOPERATIVE DIAGNOSIS:  Left proximal ureteral stone with sepsis from urinary infection.  SURGEON:  Marshall Cork. Jeffie Pollock, M.D.  ANESTHESIA:  General.  SPECIMEN:  Bladder urine.  DRAINS:  A 6-French 24 cm contour double-J stent on the left.  ESTIMATED BLOOD LOSS:  None.  COMPLICATIONS:  None.  INDICATIONS:  This is a 69 year old, white female, who is generally seen Dr. Diona Fanti in the past for urolithiasis.  She was seen in the emergency room yesterday and was found to have a 5 mm obstructing stone on CT.  At that time, her pain was controlled.  She had no fever and was sent home with pain medication.  She called this afternoon with the complaint of a temperature to 101.7 and shaking chills earlier today, and on my review her urine had too numerous to count white cells, too numerous to count red cells, but was nitrite negative.  I asked her to come to the [facility] Emergency Room for further evaluation and admission.  FINDINGS OF PROCEDURE:  She was given Zosyn preoperatively.  She was taken to the operating room where general anesthetic was induced.  She was fitted with PAS hose and placed in lithotomy position.  Her perineum and genitalia were prepped with Betadine solution.  She was draped in usual sterile fashion.  Cystoscopy was performed using the 22-French scope and 12-degree lens. Upon insertion of the scope, urine was collected and sent for culture. Inspection of the bladder revealed some bladder wall  erythema but no tumors or stones were noted.  Ureteral orifices were unremarkable.  The left ureteral orifice was cannulated with a Sensor wire which was passed to the kidney with minimal resistance.  Once the wire was up, there was some purulent drainage from the ureteral orifice.  A 6-French 24 cm double-J stent without string was then inserted over the wire to the kidney under fluoroscopic guidance.  The wire was removed leaving good curl in the kidney and good curl in the bladder.  The bladder was drained.  The patient was taken down from lithotomy position.  Her anesthetic was reversed and she was moved to recovery in stable condition.  There were no complications.     Marshall Cork. Jeffie Pollock, M.D.     JJW/MEDQ  D:  10/03/2014  T:  10/03/2014  Job:  [id number]  cc:   [name]. Dahlstedt, M.D. Fax: [phone]

## 2014-10-03 NOTE — H&P (Signed)
Day of Surgery  Subjective: Natasha Warren presented to the ER last night and was found to have a 43mm left proximal stone.  She had no fever yesterday but her UA did look infected.  She was sent out with pain meds and tamsulosin and this morning she had heavy chills and a fever to 101.7.  She has no pain but she has myalgias and malaise.  She has had no hematuria.  She had some dysuria about 3 weeks ago.   She has a prior history of stones and has had ureteroscopy and lithotripsy in the past.   ROS:  Review of Systems  Constitutional: Positive for fever and chills.  Respiratory: Negative for shortness of breath.   Cardiovascular: Negative for chest pain and leg swelling.  Gastrointestinal: Positive for nausea. Negative for vomiting.  Genitourinary: Positive for flank pain (left and it is mild. ).  All other systems reviewed and are negative.  Allergies  Allergen Reactions  . Ace Inhibitors     Patient doesn't remember this allergy.     Past Medical History  Diagnosis Date  . Hypertension   . Thyroid disorder   . Irritable bowel syndrome   . Seasonal allergies   . Hypothyroidism   . Colon polyps   . Arthritis   . Hyperlipidemia   . Urolithiasis   . History of gout   . Nocturia   . Hyperparathyroidism, primary   . Renal disorder     kidney stones    Past Surgical History  Procedure Laterality Date  . Bladder tack  09/1984  . Lithotripsy  06/1995  . 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 Rotor cuff  2011    Right Shoulder  . Knee arthroscopy  12/2010    Left knee  . Colonoscopy  06/2006    This was her second one  . Appendectomy  1954  . Cholecystectomy  01/1999  . Colonoscopy  07/12/2011    Procedure: COLONOSCOPY;  Surgeon: Rogene Houston, MD;  Location: [facility];  Service: Endoscopy;  Laterality: N/A;  10:30 am  . Abdominal hysterectomy    . Thyroidectomy N/A 07/31/2013    Procedure: PARATHYROIDECTOMY;   Surgeon: Odis Hollingshead, MD;  Location: [facility];  Service: General;  Laterality: N/A;  . Thyroid exploration N/A 07/31/2013    Procedure: neck EXPLORATION;  Surgeon: Odis Hollingshead, MD;  Location: [facility];  Service: General;  Laterality: N/A;    History   Social History  . Marital Status: Married    Spouse Name: N/A    Number of Children: N/A  . Years of Education: N/A   Occupational History  . Not on file.   Social History Main Topics  . Smoking status: Never Smoker   . Smokeless tobacco: Never Used  . Alcohol Use: Yes     Comment: Very Rarely 1/2 glass of beer  . Drug Use: No  . Sexual Activity: Not Currently   Other Topics Concern  . Not on file   Social History Narrative    Family History  Problem Relation Age of Onset  . Hypertension Mother   . Arthritis Mother   . Hypertension Father   . Healthy Brother   . Healthy Daughter   . Healthy Daughter   . Colon cancer Neg Hx     Anti-infectives: Anti-infectives    Start     Dose/Rate Route Frequency  Ordered Stop   10/03/14 2200  piperacillin-tazobactam (ZOSYN) IVPB 3.375 g     3.375 g12.5 mL/hr over 240 Minutes Intravenous 3 times per day 10/03/14 1830        Current Facility-Administered Medications  Medication Dose Route Frequency Provider Last Rate Last Dose  . [MAR Hold] acetaminophen (TYLENOL) tablet 650 mg  650 mg Oral Q6H PRN Blanchie Dessert, MD   650 mg at 10/03/14 1738  . bisacodyl (DULCOLAX) suppository 10 mg  10 mg Rectal Daily PRN Malka So, MD      . dextrose 5 % and 0.45 % NaCl with KCl 20 mEq/L infusion   Intravenous Continuous Malka So, MD      . docusate sodium (COLACE) capsule 100 mg  100 mg Oral BID Malka So, MD      . HYDROmorphone (DILAUDID) injection 0.5-1 mg  0.5-1 mg Intravenous Q2H PRN Malka So, MD      . ondansetron [facility]) injection 4 mg  4 mg Intravenous Q4H PRN Malka So, MD      . oxybutynin (DITROPAN) tablet 5 mg  5 mg Oral Q8H PRN Malka So, MD      .  oxyCODONE-acetaminophen (PERCOCET/ROXICET) 5-325 MG per tablet 1-2 tablet  1-2 tablet Oral Q4H PRN Malka So, MD      . piperacillin-tazobactam (ZOSYN) IVPB 3.375 g  3.375 g Intravenous 3 times per day Malka So, MD      . zolpidem Lorrin Mais) tablet 5 mg  5 mg Oral QHS PRN Malka So, MD       Facility-Administered Medications Ordered in Other Encounters  Medication Dose Route Frequency Provider Last Rate Last Dose  . acetaminophen (TYLENOL) tablet 650 mg  650 mg Oral Q4H PRN Malka So, MD         Objective: Vital signs in last 24 hours: Temp:  [97.8 F (36.6 C)-101.4 F (38.6 C)] 99.5 F (37.5 C) (01/16 1851) Pulse Rate:  [68-87] 81 (01/16 1851) Resp:  [18-20] 18 (01/16 1851) BP: (97-123)/(52-82) 98/58 mmHg (01/16 1914) SpO2:  [94 %-97 %] 95 % (01/16 1851) Weight:  [99.791 kg (220 lb)] 99.791 kg (220 lb) (01/16 1851)  Intake/Output from previous day:   Intake/Output this shift:     Physical Exam  Constitutional: She is oriented to person, place, and time and well-developed, well-nourished, and in no distress.  HENT:  Head: Normocephalic and atraumatic.  Neck: Normal range of motion. Neck supple. No thyromegaly present.  Cardiovascular: Normal rate, regular rhythm and normal heart sounds.   No murmur heard. Pulmonary/Chest: Effort normal and breath sounds normal. No respiratory distress.  Abdominal: Soft. She exhibits no distension. There is tenderness (LUQ moderate). There is no guarding.  Musculoskeletal: Normal range of motion. She exhibits no edema or tenderness.  Lymphadenopathy:    She has no cervical adenopathy.  Neurological: She is alert and oriented to person, place, and time.  Skin: Skin is warm and dry.  Psychiatric: Affect and judgment normal.  Vitals reviewed.   Lab Results:   Recent Labs  10/02/14 2156 10/03/14 1857  WBC 12.8* 12.8*  HGB 12.8 12.0  HCT 39.6 36.8  PLT 379 305   BMET  Recent Labs  10/02/14 2156  NA 135  K 4.2  CL  102  CO2 25  GLUCOSE 125*  BUN 31*  CREATININE 1.50*  CALCIUM 9.0   PT/INR No results for input(s): LABPROT, INR in the last 72 hours. ABG No  results for input(s): PHART, HCO3 in the last 72 hours.  Invalid input(s): PCO2, PO2  Studies/Results: Ct Abdomen Pelvis Wo Contrast  10/03/2014   CLINICAL DATA:  Acute onset of left lower quadrant abdominal pain and mild nausea. Initial encounter.  EXAM: CT ABDOMEN AND PELVIS WITHOUT CONTRAST  TECHNIQUE: Multidetector CT imaging of the abdomen and pelvis was performed following the standard protocol without IV contrast.  COMPARISON:  Abdominal radiograph performed 02/11/2014, and CT of the abdomen and pelvis from 02/02/2004  FINDINGS: The visualized lung bases are clear.  The liver and spleen are unremarkable in appearance. The patient is status post cholecystectomy, with clips noted along the gallbladder fossa. The pancreas and adrenal glands are unremarkable.  There is minimal left-sided hydronephrosis, with an obstructing 6 x 4 mm stone in the proximal ureter, at the left ureteropelvic junction. Asymmetric left-sided perinephric stranding and fluid are seen.  Mild bilateral renal atrophy and scarring are seen. Multiple nonobstructing bilateral renal stones are seen, more prominent on the left, measuring up to 1.6 cm on the left.  No free fluid is identified. The small bowel is unremarkable in appearance. The stomach is within normal limits. No acute vascular abnormalities are seen.  The patient is status post appendectomy. Minimal diverticulosis is noted along the proximal sigmoid colon, without evidence of diverticulitis.  The bladder is decompressed and not well assessed. The patient is status post hysterectomy. No suspicious adnexal masses are seen. No inguinal lymphadenopathy is seen.  No acute osseous abnormalities are identified. Multilevel vacuum phenomenon is noted along the lumbar spine. Underlying facet disease is noted.  IMPRESSION: 1. Minimal  left-sided hydronephrosis, with an obstructing 6 x 4 mm stone in the proximal left ureter, at the left ureteropelvic junction. Asymmetric left-sided perinephric stranding and fluid noted. 2. Mild bilateral renal atrophy and scarring. Multiple nonobstructing bilateral renal stones, more prominent on the left, measuring up to 1.6 cm. 3. Minimal diverticulosis along the proximal sigmoid colon, without evidence of diverticulitis. 4. Mild degenerative change noted along the lumbar spine.   Electronically Signed   By: Garald Balding M.D.   On: 10/03/2014 00:00   X-ray Chest Pa And Lateral  10/03/2014   CLINICAL DATA:  Left kidney stone, preop.  EXAM: CHEST  2 VIEW  COMPARISON:  09/27/2012  FINDINGS: The heart size and mediastinal contours are within normal limits. Both lungs are clear. The visualized skeletal structures are unremarkable.  IMPRESSION: No active cardiopulmonary disease.   Electronically Signed   By: Rolm Baptise M.D.   On: 10/03/2014 19:02   Abdomen 1 View (kub)  10/03/2014   CLINICAL DATA:  Left ureteral calculus.  Preop.  EXAM: ABDOMEN - 1 VIEW  COMPARISON:  CT 10/02/2014  FINDINGS: The 5 mm stone at the left ureteropelvic junction is not significantly changed when compared to prior CT. Are multiple additional nonobstructing calculi within the left kidney, largest measuring 14 mm. There stone seen projecting over the right renal fossa. No definite stones over the course of either ureter. No bladder stones are seen. Pelvic phleboliths are unchanged. There is a nonobstructive bowel gas pattern.  IMPRESSION: Stone at the left ureteropelvic junction measures 5 mm and is not significantly changed. Additional stones in the right and left kidney, similar to prior CT.   Electronically Signed   By: Jeb Levering M.D.   On: 10/03/2014 19:05   I have reviewed her labs and films and also have reviewed her PSFHx.    Assessment: She has a 38mm  left proximal stone with obstruction and sepsis from a UTI.   She has bilateral renal stones.   s/p Procedure(s): CYSTOSCOPY WITH RETROGRADE PYELOGRAM/URETERAL STENT PLACEMENT  Plan: I am going to start her on zosyn and do cystoscopy with left ureteral stent insertion.   I have reviewed the risks of bleeding, infection, ureteral injury, need for secondary procedures, thrombotic events and anesthetic complications.   CC: Dr. Franchot Gallo.     LOS: 0 days    Darbie Biancardi J 10/03/2014

## 2014-10-03 NOTE — ED Notes (Signed)
Pt states risk/benefits have NOT been discussed yet with MD; consent to be signed post conversation with surgeon.

## 2014-10-03 NOTE — Discharge Instructions (Signed)
Follow up with [facility]. Get your creatinine level checked next week with your primary care doctor. Drink plenty of fluids. Return to the ED immediately if you develop fever, uncontrolled pain or vomiting, or other concerns.   Kidney Stones Kidney stones (urolithiasis) are deposits that form inside your kidneys. The intense pain is caused by the stone moving through the urinary tract. When the stone moves, the ureter goes into spasm around the stone. The stone is usually passed in the urine.  CAUSES   A disorder that makes certain neck glands produce too much parathyroid hormone (primary hyperparathyroidism).  A buildup of uric acid crystals, similar to gout in your joints.  Narrowing (stricture) of the ureter.  A kidney obstruction present at birth (congenital obstruction).  Previous surgery on the kidney or ureters.  Numerous kidney infections. SYMPTOMS   Feeling sick to your stomach (nauseous).  Throwing up (vomiting).  Blood in the urine (hematuria).  Pain that usually spreads (radiates) to the groin.  Frequency or urgency of urination. DIAGNOSIS   Taking a history and physical exam.  Blood or urine tests.  CT scan.  Occasionally, an examination of the inside of the urinary bladder (cystoscopy) is performed. TREATMENT   Observation.  Increasing your fluid intake.  Extracorporeal shock wave lithotripsy--This is a noninvasive procedure that uses shock waves to break up kidney stones.  Surgery may be needed if you have severe pain or persistent obstruction. There are various surgical procedures. Most of the procedures are performed with the use of small instruments. Only small incisions are needed to accommodate these instruments, so recovery time is minimized. The size, location, and chemical composition are all important variables that will determine the proper choice of action for you. Talk to your health care provider to better understand your situation  so that you will minimize the risk of injury to yourself and your kidney.  HOME CARE INSTRUCTIONS   Drink enough water and fluids to keep your urine clear or pale yellow. This will help you to pass the stone or stone fragments.  Strain all urine through the provided strainer. Keep all particulate matter and stones for your health care provider to see. The stone causing the pain may be as small as a grain of salt. It is very important to use the strainer each and every time you pass your urine. The collection of your stone will allow your health care provider to analyze it and verify that a stone has actually passed. The stone analysis will often identify what you can do to reduce the incidence of recurrences.  Only take over-the-counter or prescription medicines for pain, discomfort, or fever as directed by your health care provider.  Make a follow-up appointment with your health care provider as directed.  Get follow-up X-rays if required. The absence of pain does not always mean that the stone has passed. It may have only stopped moving. If the urine remains completely obstructed, it can cause loss of kidney function or even complete destruction of the kidney. It is your responsibility to make sure X-rays and follow-ups are completed. Ultrasounds of the kidney can show blockages and the status of the kidney. Ultrasounds are not associated with any radiation and can be performed easily in a matter of minutes. SEEK MEDICAL CARE IF:  You experience pain that is progressive and unresponsive to any pain medicine you have been prescribed. SEEK IMMEDIATE MEDICAL CARE IF:   Pain cannot be controlled with the prescribed medicine.  You have  a fever or shaking chills.  The severity or intensity of pain increases over 18 hours and is not relieved by pain medicine.  You develop a new onset of abdominal pain.  You feel faint or pass out.  You are unable to urinate. MAKE SURE YOU:   Understand these  instructions.  Will watch your condition.  Will get help right away if you are not doing well or get worse. Document Released: 09/04/2005 Document Revised: 05/07/2013 Document Reviewed: 02/05/2013 [facility] Patient Information 2015 [place], [place]. This information is not intended to replace advice given to you by your health care provider. Make sure you discuss any questions you have with your health care provider.

## 2014-10-03 NOTE — ED Notes (Signed)
Pt denies pain but reports discomfort related to chills all over. See MAR pt given tylenol in triage.

## 2014-10-03 NOTE — Anesthesia Procedure Notes (Signed)
Procedure Name: Intubation Date/Time: 10/03/2014 8:05 PM Performed by: Anne Fu Pre-anesthesia Checklist: Patient identified, Emergency Drugs available, Suction available, Patient being monitored and Timeout performed Patient Re-evaluated:Patient Re-evaluated prior to inductionOxygen Delivery Method: Circle system utilized Preoxygenation: Pre-oxygenation with 100% oxygen Intubation Type: IV induction and Rapid sequence Laryngoscope Size: Mac and 4 Grade View: Grade I Tube type: Oral Tube size: 7.5 mm Number of attempts: 1 Airway Equipment and Method: Stylet Placement Confirmation: ETT inserted through vocal cords under direct vision,  positive ETCO2,  CO2 detector and breath sounds checked- equal and bilateral Secured at: 20 cm Tube secured with: Tape Dental Injury: Teeth and Oropharynx as per pre-operative assessment

## 2014-10-03 NOTE — Brief Op Note (Signed)
10/03/2014  8:22 PM  PATIENT:  Natasha Warren  69 y.o. female  PRE-OPERATIVE DIAGNOSIS:  left ureteral stone with fever  POST-OPERATIVE DIAGNOSIS:  left ureteral stone with fever PROCEDURE:  Procedure(s): CYSTOSCOPY WITH URETERAL STENT PLACEMENT (Left)  SURGEON:  Surgeon(s) and Role:    * Malka So, MD - Primary  PHYSICIAN ASSISTANT:   ASSISTANTS: none   ANESTHESIA:   general  EBL:     BLOOD ADMINISTERED:none  DRAINS: left 6 x 24 JJ stent   LOCAL MEDICATIONS USED:  NONE  SPECIMEN:  Source of Specimen:  urine from bladder for culture.  DISPOSITION OF SPECIMEN:  PATHOLOGY  COUNTS:  YES  TOURNIQUET:  * No tourniquets in log *  DICTATION: .Other Dictation: Dictation Number [id number]  PLAN OF CARE: Admit for overnight observation  PATIENT DISPOSITION:  PACU - hemodynamically stable.   Delay start of Pharmacological VTE agent (>24hrs) due to surgical blood loss or risk of bleeding: not applicable

## 2014-10-03 NOTE — ED Provider Notes (Signed)
CSN: [id number]     Arrival date & time 10/03/14  1713 History   First MD Initiated Contact with Patient 10/03/14 1814     Chief Complaint  Patient presents with  . Fever     (Consider location/radiation/quality/duration/timing/severity/associated sxs/prior Treatment) Patient is a 69 y.o. female presenting with fever. The history is provided by the patient.  Fever Max temp prior to arrival:  102 Temp source:  Oral Severity:  Moderate Onset quality:  Sudden Duration:  12 hours Timing:  Constant Progression:  Unchanged Chronicity:  New Relieved by:  Acetaminophen Worsened by:  Nothing tried Associated symptoms: chills and vomiting   Associated symptoms: no confusion, no cough, no dysuria and no nausea   Risk factors comment:  Seen in ED last night with kidney stone and d/ced home   Past Medical History  Diagnosis Date  . Hypertension   . Thyroid disorder   . Irritable bowel syndrome   . Seasonal allergies   . Hypothyroidism   . Colon polyps   . Arthritis   . Hyperlipidemia   . Urolithiasis   . History of gout   . Nocturia   . Hyperparathyroidism, primary   . Renal disorder     kidney stones   Past Surgical History  Procedure Laterality Date  . Bladder tack  09/1984  . Lithotripsy  06/1995  . 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 Rotor cuff  2011    Right Shoulder  . Knee arthroscopy  12/2010    Left knee  . Colonoscopy  06/2006    This was her second one  . Appendectomy  1954  . Cholecystectomy  01/1999  . Colonoscopy  07/12/2011    Procedure: COLONOSCOPY;  Surgeon: Rogene Houston, MD;  Location: [facility];  Service: Endoscopy;  Laterality: N/A;  10:30 am  . Abdominal hysterectomy    . Thyroidectomy N/A 07/31/2013    Procedure: PARATHYROIDECTOMY;  Surgeon: Odis Hollingshead, MD;  Location: [facility];  Service: General;  Laterality: N/A;  . Thyroid exploration N/A 07/31/2013    Procedure: neck  EXPLORATION;  Surgeon: Odis Hollingshead, MD;  Location: [facility];  Service: General;  Laterality: N/A;   Family History  Problem Relation Age of Onset  . Hypertension Mother   . Arthritis Mother   . Hypertension Father   . Healthy Brother   . Healthy Daughter   . Healthy Daughter   . Colon cancer Neg Hx    History  Substance Use Topics  . Smoking status: Never Smoker   . Smokeless tobacco: Never Used  . Alcohol Use: Yes     Comment: Very Rarely 1/2 glass of beer   OB History    No data available     Review of Systems  Constitutional: Positive for fever and chills.  Respiratory: Negative for cough.   Gastrointestinal: Positive for vomiting. Negative for nausea.  Genitourinary: Negative for dysuria.  Psychiatric/Behavioral: Negative for confusion.  All other systems reviewed and are negative.     Allergies  Ace inhibitors  Home Medications   Prior to Admission medications   Medication Sig Start Date End Date Taking? Authorizing Provider  acetaminophen (TYLENOL) 500 MG tablet Take 1,000 mg by mouth every 6 (six) hours as needed for pain.   Yes Historical Provider, MD  cholecalciferol (VITAMIN D) 1000 UNITS tablet Take 1,000 Units by mouth daily.   Yes  Historical Provider, MD  levothyroxine (SYNTHROID, LEVOTHROID) 50 MCG tablet Take 50 mcg by mouth daily before breakfast.    Yes Historical Provider, MD  losartan-hydrochlorothiazide (HYZAAR) 100-25 MG per tablet Take 1 tablet by mouth every morning.    Yes Historical Provider, MD  metoprolol succinate (TOPROL-XL) 50 MG 24 hr tablet Take 50 mg by mouth every morning. Take with or immediately following a meal.   Yes Historical Provider, MD  ondansetron (ZOFRAN) 4 MG tablet Take 1 tablet (4 mg total) by mouth every 8 (eight) hours as needed for nausea or vomiting. 10/03/14  Yes Margarita Mail, PA-C  oxyCODONE-acetaminophen (PERCOCET) 5-325 MG per tablet Take 1-2 tablets by mouth every 4 (four) hours as needed. 10/03/14  Yes  Margarita Mail, PA-C  tamsulosin (FLOMAX) 0.4 MG CAPS capsule Take 1 capsule (0.4 mg total) by mouth 2 (two) times daily. 10/03/14  Yes Margarita Mail, PA-C  Vitamin D, Ergocalciferol, (DRISDOL) 50000 UNITS CAPS capsule Take 1 capsule (50,000 Units total) by mouth every 7 (seven) days. Patient not taking: Reported on 10/03/2014 09/02/13   Jackolyn Confer, MD   BP 111/65 mmHg  Pulse 87  Temp(Src) 100.7 F (38.2 C) (Oral)  Resp 20  SpO2 94% Physical Exam  Constitutional: She is oriented to person, place, and time. She appears well-developed and well-nourished. No distress.  HENT:  Head: Normocephalic and atraumatic.  Mouth/Throat: Oropharynx is clear and moist.  Eyes: Conjunctivae and EOM are normal. Pupils are equal, round, and reactive to light.  Neck: Normal range of motion. Neck supple.  Cardiovascular: Normal rate, regular rhythm and intact distal pulses.   No murmur heard. Pulmonary/Chest: Effort normal and breath sounds normal. No respiratory distress. She has no wheezes. She has no rales.  Abdominal: Soft. She exhibits no distension. There is no tenderness. There is no rebound and no guarding.  No significant abdominal or flank pain  Musculoskeletal: Normal range of motion. She exhibits no edema or tenderness.  Neurological: She is alert and oriented to person, place, and time.  Skin: Skin is warm and dry. No rash noted. No erythema.  Psychiatric: She has a normal mood and affect. Her behavior is normal.  Nursing note and vitals reviewed.   ED Course  Procedures (including critical care time) Labs Review Labs Reviewed  CULTURE, BLOOD (ROUTINE X 2)  CULTURE, BLOOD (ROUTINE X 2)  URINE CULTURE  CBC WITH DIFFERENTIAL  COMPREHENSIVE METABOLIC PANEL  LACTIC ACID, PLASMA    Imaging Review Ct Abdomen Pelvis Wo Contrast  10/03/2014   CLINICAL DATA:  Acute onset of left lower quadrant abdominal pain and mild nausea. Initial encounter.  EXAM: CT ABDOMEN AND PELVIS WITHOUT  CONTRAST  TECHNIQUE: Multidetector CT imaging of the abdomen and pelvis was performed following the standard protocol without IV contrast.  COMPARISON:  Abdominal radiograph performed 02/11/2014, and CT of the abdomen and pelvis from 02/02/2004  FINDINGS: The visualized lung bases are clear.  The liver and spleen are unremarkable in appearance. The patient is status post cholecystectomy, with clips noted along the gallbladder fossa. The pancreas and adrenal glands are unremarkable.  There is minimal left-sided hydronephrosis, with an obstructing 6 x 4 mm stone in the proximal ureter, at the left ureteropelvic junction. Asymmetric left-sided perinephric stranding and fluid are seen.  Mild bilateral renal atrophy and scarring are seen. Multiple nonobstructing bilateral renal stones are seen, more prominent on the left, measuring up to 1.6 cm on the left.  No free fluid is identified. The small bowel is  unremarkable in appearance. The stomach is within normal limits. No acute vascular abnormalities are seen.  The patient is status post appendectomy. Minimal diverticulosis is noted along the proximal sigmoid colon, without evidence of diverticulitis.  The bladder is decompressed and not well assessed. The patient is status post hysterectomy. No suspicious adnexal masses are seen. No inguinal lymphadenopathy is seen.  No acute osseous abnormalities are identified. Multilevel vacuum phenomenon is noted along the lumbar spine. Underlying facet disease is noted.  IMPRESSION: 1. Minimal left-sided hydronephrosis, with an obstructing 6 x 4 mm stone in the proximal left ureter, at the left ureteropelvic junction. Asymmetric left-sided perinephric stranding and fluid noted. 2. Mild bilateral renal atrophy and scarring. Multiple nonobstructing bilateral renal stones, more prominent on the left, measuring up to 1.6 cm. 3. Minimal diverticulosis along the proximal sigmoid colon, without evidence of diverticulitis. 4. Mild  degenerative change noted along the lumbar spine.   Electronically Signed   By: Garald Balding M.D.   On: 10/03/2014 00:00     EKG Interpretation None      MDM   Final diagnoses:  Kidney stone  Left ureteral calculus   patient presents here with fever and chills after being seen in the emergency room last night with a proximal left ureteral stone which was 6 x 4 mm. Patient was not discharged home with antibiotics and had a urine concerning for infection. She is not having significant pain from the stone however she has had fever all day. She was given Tylenol here. Spoke with Dr. Jeffie Pollock who had intended for her to be a direct admit but since she is here will start an IV, draw blood and get a KUB prior to her going to the floor while she is waiting to have a stent placed.  Blanchie Dessert, MD 10/03/14 [phone]

## 2014-10-04 ENCOUNTER — Encounter (HOSPITAL_COMMUNITY): Payer: Self-pay | Admitting: *Deleted

## 2014-10-04 DIAGNOSIS — M1990 Unspecified osteoarthritis, unspecified site: Secondary | ICD-10-CM | POA: Diagnosis present

## 2014-10-04 DIAGNOSIS — R509 Fever, unspecified: Secondary | ICD-10-CM | POA: Diagnosis present

## 2014-10-04 DIAGNOSIS — N202 Calculus of kidney with calculus of ureter: Secondary | ICD-10-CM | POA: Diagnosis not present

## 2014-10-04 DIAGNOSIS — Z888 Allergy status to other drugs, medicaments and biological substances status: Secondary | ICD-10-CM | POA: Diagnosis not present

## 2014-10-04 DIAGNOSIS — N179 Acute kidney failure, unspecified: Secondary | ICD-10-CM | POA: Diagnosis not present

## 2014-10-04 DIAGNOSIS — N200 Calculus of kidney: Secondary | ICD-10-CM | POA: Diagnosis not present

## 2014-10-04 DIAGNOSIS — Z6838 Body mass index (BMI) 38.0-38.9, adult: Secondary | ICD-10-CM | POA: Diagnosis not present

## 2014-10-04 DIAGNOSIS — N201 Calculus of ureter: Secondary | ICD-10-CM | POA: Diagnosis not present

## 2014-10-04 DIAGNOSIS — I10 Essential (primary) hypertension: Secondary | ICD-10-CM | POA: Diagnosis present

## 2014-10-04 DIAGNOSIS — A419 Sepsis, unspecified organism: Secondary | ICD-10-CM | POA: Diagnosis not present

## 2014-10-04 DIAGNOSIS — E210 Primary hyperparathyroidism: Secondary | ICD-10-CM | POA: Diagnosis present

## 2014-10-04 DIAGNOSIS — I9581 Postprocedural hypotension: Secondary | ICD-10-CM | POA: Diagnosis not present

## 2014-10-04 DIAGNOSIS — E039 Hypothyroidism, unspecified: Secondary | ICD-10-CM | POA: Diagnosis present

## 2014-10-04 DIAGNOSIS — K589 Irritable bowel syndrome without diarrhea: Secondary | ICD-10-CM | POA: Diagnosis present

## 2014-10-04 DIAGNOSIS — Z86010 Personal history of colonic polyps: Secondary | ICD-10-CM | POA: Diagnosis not present

## 2014-10-04 DIAGNOSIS — E785 Hyperlipidemia, unspecified: Secondary | ICD-10-CM | POA: Diagnosis present

## 2014-10-04 DIAGNOSIS — Z8249 Family history of ischemic heart disease and other diseases of the circulatory system: Secondary | ICD-10-CM | POA: Diagnosis not present

## 2014-10-04 DIAGNOSIS — N390 Urinary tract infection, site not specified: Secondary | ICD-10-CM | POA: Diagnosis present

## 2014-10-04 DIAGNOSIS — Z9049 Acquired absence of other specified parts of digestive tract: Secondary | ICD-10-CM | POA: Diagnosis present

## 2014-10-04 MED ORDER — INFLUENZA VAC SPLIT QUAD 0.5 ML IM SUSY
0.5000 mL | PREFILLED_SYRINGE | INTRAMUSCULAR | Status: DC
  Filled 2014-10-04 (×2): qty 0.5

## 2014-10-04 MED ORDER — IBUPROFEN 200 MG PO TABS
400.0000 mg | ORAL_TABLET | Freq: Four times a day (QID) | ORAL | Status: DC | PRN
  Administered 2014-10-04: 400 mg via ORAL
  Filled 2014-10-04: qty 2

## 2014-10-04 NOTE — Progress Notes (Signed)
Surgical wound class 3.  Verified with surgeon at end of case previously entered incorrectly.

## 2014-10-04 NOTE — Progress Notes (Signed)
Patient ID: Natasha Warren, female   DOB: Jan 22, 1946, 69 y.o.   MRN: [id number] 1 Day Post-Op  Subjective: Natasha Warren is POD #1 from cystoscopy and left ureteral stenting for a 75mm left proximal stone with UTI and possible sepsis.   She is doing well this morning but did have some hypotension postop.  She is now normotensive and afebrile.  She has no flank pain and the fever has not recurred.   Cultures are pending.  She is on zosyn.   She has multiple stones with the largest being a 13.79mm LLP stone.     ROS:  Review of Systems  Constitutional: Negative for fever and chills.  Gastrointestinal: Negative for nausea and vomiting.  Genitourinary: Positive for dysuria and hematuria. Negative for flank pain.    Anti-infectives: Anti-infectives    Start     Dose/Rate Route Frequency Ordered Stop   10/03/14 2200  piperacillin-tazobactam (ZOSYN) IVPB 3.375 g     3.375 g12.5 mL/hr over 240 Minutes Intravenous 3 times per day 10/03/14 1830        Current Facility-Administered Medications  Medication Dose Route Frequency Provider Last Rate Last Dose  . acetaminophen (TYLENOL) tablet 650 mg  650 mg Oral Q6H PRN Blanchie Dessert, MD   650 mg at 10/04/14 0540  . bisacodyl (DULCOLAX) suppository 10 mg  10 mg Rectal Daily PRN Malka So, MD      . dextrose 5 % and 0.45 % NaCl with KCl 20 mEq/L infusion   Intravenous Continuous Malka So, MD 100 mL/hr at 10/03/14 2147    . docusate sodium (COLACE) capsule 100 mg  100 mg Oral BID Malka So, MD   100 mg at 10/04/14 [id number]  . hydrochlorothiazide (HYDRODIURIL) tablet 25 mg  25 mg Oral Daily Malka So, MD      . HYDROmorphone (DILAUDID) injection 0.5-1 mg  0.5-1 mg Intravenous Q2H PRN Malka So, MD      . Derrill Memo ON 10/05/2014] Influenza vac split quadrivalent PF (FLUARIX) injection 0.5 mL  0.5 mL Intramuscular Tomorrow-1000 Malka So, MD      . lactated ringers infusion   Intravenous Continuous Anne Fu, CRNA   0  at 10/03/14 2140   . levothyroxine (SYNTHROID, LEVOTHROID) tablet 50 mcg  50 mcg Oral QAC breakfast Malka So, MD   50 mcg at 10/04/14 [phone]  . losartan (COZAAR) tablet 100 mg  100 mg Oral Daily Malka So, MD      . metoprolol succinate (TOPROL-XL) 24 hr tablet 50 mg  50 mg Oral q morning - 10a Malka So, MD      . ondansetron [facility]) injection 4 mg  4 mg Intravenous Q4H PRN Malka So, MD      . oxybutynin (DITROPAN) tablet 5 mg  5 mg Oral Q8H PRN Malka So, MD      . oxyCODONE-acetaminophen (PERCOCET/ROXICET) 5-325 MG per tablet 1-2 tablet  1-2 tablet Oral Q4H PRN Malka So, MD      . piperacillin-tazobactam (ZOSYN) IVPB 3.375 g  3.375 g Intravenous 3 times per day Malka So, MD   3.375 g at 10/04/14 0541  . tamsulosin (FLOMAX) capsule 0.4 mg  0.4 mg Oral BID Malka So, MD   0.4 mg at 10/04/14 0916  . zolpidem (AMBIEN) tablet 5 mg  5 mg Oral QHS PRN Malka So, MD       Facility-Administered Medications Ordered in Other Encounters  Medication Dose Route Frequency Provider Last Rate Last Dose  . acetaminophen (TYLENOL) tablet 650 mg  650 mg Oral Q4H PRN Malka So, MD         Objective: Vital signs in last 24 hours: Temp:  [98.1 F (36.7 C)-101.4 F (38.6 C)] 98.1 F (36.7 C) (01/17 0811) Pulse Rate:  [72-87] 81 (01/17 0532) Resp:  [16-21] 18 (01/17 0532) BP: (78-128)/(47-65) 128/62 mmHg (01/17 0532) SpO2:  [94 %-100 %] 94 % (01/17 0532) Weight:  [99.791 kg (220 lb)] 99.791 kg (220 lb) (01/16 1851)  Intake/Output from previous day: 01/16 0701 - 01/17 0700 In: 500 [I.V.:400; IV Piggyback:100] Out: -  Intake/Output this shift:     Physical Exam  Constitutional: She is well-developed, well-nourished, and in no distress.  Cardiovascular: Normal rate and regular rhythm.   Pulmonary/Chest: Effort normal and breath sounds normal. No respiratory distress.  Abdominal: Soft. There is no tenderness.  Vitals reviewed.   Lab Results:   Recent Labs  10/02/14 2156  10/03/14 1857  WBC 12.8* 12.8*  HGB 12.8 12.0  HCT 39.6 36.8  PLT 379 305   BMET  Recent Labs  10/02/14 2156 10/03/14 1857  NA 135 131*  K 4.2 4.1  CL 102 102  CO2 25 25  GLUCOSE 125* 132*  BUN 31* 30*  CREATININE 1.50* 1.86*  CALCIUM 9.0 8.7   PT/INR No results for input(s): LABPROT, INR in the last 72 hours. ABG No results for input(s): PHART, HCO3 in the last 72 hours.  Invalid input(s): PCO2, PO2  Studies/Results: Ct Abdomen Pelvis Wo Contrast  10/03/2014   CLINICAL DATA:  Acute onset of left lower quadrant abdominal pain and mild nausea. Initial encounter.  EXAM: CT ABDOMEN AND PELVIS WITHOUT CONTRAST  TECHNIQUE: Multidetector CT imaging of the abdomen and pelvis was performed following the standard protocol without IV contrast.  COMPARISON:  Abdominal radiograph performed 02/11/2014, and CT of the abdomen and pelvis from 02/02/2004  FINDINGS: The visualized lung bases are clear.  The liver and spleen are unremarkable in appearance. The patient is status post cholecystectomy, with clips noted along the gallbladder fossa. The pancreas and adrenal glands are unremarkable.  There is minimal left-sided hydronephrosis, with an obstructing 6 x 4 mm stone in the proximal ureter, at the left ureteropelvic junction. Asymmetric left-sided perinephric stranding and fluid are seen.  Mild bilateral renal atrophy and scarring are seen. Multiple nonobstructing bilateral renal stones are seen, more prominent on the left, measuring up to 1.6 cm on the left.  No free fluid is identified. The small bowel is unremarkable in appearance. The stomach is within normal limits. No acute vascular abnormalities are seen.  The patient is status post appendectomy. Minimal diverticulosis is noted along the proximal sigmoid colon, without evidence of diverticulitis.  The bladder is decompressed and not well assessed. The patient is status post hysterectomy. No suspicious adnexal masses are seen. No inguinal  lymphadenopathy is seen.  No acute osseous abnormalities are identified. Multilevel vacuum phenomenon is noted along the lumbar spine. Underlying facet disease is noted.  IMPRESSION: 1. Minimal left-sided hydronephrosis, with an obstructing 6 x 4 mm stone in the proximal left ureter, at the left ureteropelvic junction. Asymmetric left-sided perinephric stranding and fluid noted. 2. Mild bilateral renal atrophy and scarring. Multiple nonobstructing bilateral renal stones, more prominent on the left, measuring up to 1.6 cm. 3. Minimal diverticulosis along the proximal sigmoid colon, without evidence of diverticulitis. 4. Mild degenerative change noted along the lumbar spine.  Electronically Signed   By: Garald Balding M.D.   On: 10/03/2014 00:00   X-ray Chest Pa And Lateral  10/03/2014   CLINICAL DATA:  Left kidney stone, preop.  EXAM: CHEST  2 VIEW  COMPARISON:  09/27/2012  FINDINGS: The heart size and mediastinal contours are within normal limits. Both lungs are clear. The visualized skeletal structures are unremarkable.  IMPRESSION: No active cardiopulmonary disease.   Electronically Signed   By: Rolm Baptise M.D.   On: 10/03/2014 19:02   Abdomen 1 View (kub)  10/03/2014   CLINICAL DATA:  Left ureteral calculus.  Preop.  EXAM: ABDOMEN - 1 VIEW  COMPARISON:  CT 10/02/2014  FINDINGS: The 5 mm stone at the left ureteropelvic junction is not significantly changed when compared to prior CT. Are multiple additional nonobstructing calculi within the left kidney, largest measuring 14 mm. There stone seen projecting over the right renal fossa. No definite stones over the course of either ureter. No bladder stones are seen. Pelvic phleboliths are unchanged. There is a nonobstructive bowel gas pattern.  IMPRESSION: Stone at the left ureteropelvic junction measures 5 mm and is not significantly changed. Additional stones in the right and left kidney, similar to prior CT.   Electronically Signed   By: Jeb Levering  M.D.   On: 10/03/2014 19:05   Dg C-arm 1-60 Min  10/03/2014   CLINICAL DATA:  Cystoscopy with ureteral stent placement.  EXAM: DG C-ARM 61-120 MIN  COMPARISON:  None.  FINDINGS: Single intraoperative spot image demonstrates the proximal and mid portions of a left ureteral stent. Calcifications project over the mid and lower pole of the left kidney.  IMPRESSION: Left ureteral stent placement.  Left nephrolithiasis.   Electronically Signed   By: Rolm Baptise M.D.   On: 10/03/2014 21:37   Cultures are pending.   Assessment: s/p Procedure(s): CYSTOSCOPY WITH URETERAL STENT PLACEMENT  Her fever has resolved post stenting on zosyn and cultures are pending.  Unfortunately a culture wasn't done on her initial UA on 1/15.    She was hypotensive postop but is now normotensive but still not high enough that she needs her antihypertensives today.    Plan: Hold Losartan and Metoprolol.   Continue zosyn until cultures have returned.  She will need additional therapy for her stones and Dr. Diona Fanti will be following her for that.   CC: Dr. Franchot Gallo.     LOS: 1 day    Marcellous Snarski J 10/04/2014

## 2014-10-04 NOTE — Progress Notes (Signed)
UR completed 

## 2014-10-05 ENCOUNTER — Encounter (HOSPITAL_COMMUNITY): Payer: Self-pay | Admitting: Urology

## 2014-10-05 NOTE — Clinical Documentation Improvement (Signed)
Possible Clinical Conditions?   Acute Renal Failure/Acute Kidney Injury Acute Tubular Necrosis Acute on Chronic Renal Failure Chronic Renal Failure Other Condition Cannot Clinically Determine   Supporting Information: Risk Factors: (As per notes) "She has a 81mm left proximal stone with obstruction and sepsis from a UTI.  She has bilateral renal stones."  Diagnostics: Component     Latest Ref Rng 10/02/2014 10/03/2014  BUN     6 - 23 mg/dL 31 (H) 30 (H)  Creatinine     0.50 - 1.10 mg/dL 1.50 (H) 1.86 (H)   Thank You, Alessandra Grout, RN, BSN, CCDS,Clinical Documentation Specialist:  [phone]  [phone]=[facility] [place]- Health Information Management

## 2014-10-05 NOTE — Progress Notes (Signed)
2 Days Post-Op Subjective: Patient reports that she is having no pain. A little bit of discomfort with urination from the stent, but overall she is improving.  Objective: Vital signs in last 24 hours: Temp:  [98.1 F (36.7 C)-101.7 F (38.7 C)] 98.1 F (36.7 C) (01/18 0442) Pulse Rate:  [65-82] 65 (01/18 0442) Resp:  [18-19] 18 (01/18 0442) BP: (110-113)/(56-67) 113/67 mmHg (01/18 0442) SpO2:  [95 %-100 %] 99 % (01/18 0442)  Intake/Output from previous day: 01/17 0701 - 01/18 0700 In: 3851.7 [P.O.:480; I.V.:3221.7; IV Piggyback:150] Out: -  Intake/Output this shift:    Physical Exam:  Constitutional: Vital signs reviewed. WD WN in NAD   Eyes: PERRL, No scleral icterus.   Pulmonary/Chest: Normal effort   Lab Results:  Recent Labs  10/02/14 2156 10/03/14 1857  HGB 12.8 12.0  HCT 39.6 36.8   BMET  Recent Labs  10/02/14 2156 10/03/14 1857  NA 135 131*  K 4.2 4.1  CL 102 102  CO2 25 25  GLUCOSE 125* 132*  BUN 31* 30*  CREATININE 1.50* 1.86*  CALCIUM 9.0 8.7   No results for input(s): LABPT, INR in the last 72 hours. No results for input(s): LABURIN in the last 72 hours. Results for orders placed or performed during the hospital encounter of 10/03/14  Culture, blood (routine x 2)     Status: None (Preliminary result)   Collection Time: 10/03/14  6:57 PM  Result Value Ref Range Status   Specimen Description BLOOD LEFT FOREARM  Final   Special Requests BOTTLES DRAWN AEROBIC AND ANAEROBIC 3CC  Final   Culture   Final           BLOOD CULTURE RECEIVED NO GROWTH TO DATE CULTURE WILL BE HELD FOR 5 DAYS BEFORE ISSUING A FINAL NEGATIVE REPORT Note: Culture results may be compromised due to an inadequate volume of blood received in culture bottles. Performed at [facility]    Report Status PENDING  Incomplete  Culture, blood (routine x 2)     Status: None (Preliminary result)   Collection Time: 10/03/14  6:57 PM  Result Value Ref Range Status   Specimen  Description BLOOD LEFT HAND  Final   Special Requests BOTTLES DRAWN AEROBIC AND ANAEROBIC 3CC  Final   Culture   Final           BLOOD CULTURE RECEIVED NO GROWTH TO DATE CULTURE WILL BE HELD FOR 5 DAYS BEFORE ISSUING A FINAL NEGATIVE REPORT Performed at [facility]    Report Status PENDING  Incomplete  Urine culture     Status: None (Preliminary result)   Collection Time: 10/03/14  8:19 PM  Result Value Ref Range Status   Specimen Description URINE, RANDOM CYSTOSCOPE URINE  Final   Special Requests PATIENT ON FOLLOWING ZOSYN  Final   Colony Count   Final    45,000 COLONIES/ML Performed at [facility]    Culture   Final    [place] Performed at [facility]    Report Status PENDING  Incomplete    Studies/Results: X-ray Chest Pa And Lateral  10/03/2014   CLINICAL DATA:  Left kidney stone, preop.  EXAM: CHEST  2 VIEW  COMPARISON:  09/27/2012  FINDINGS: The heart size and mediastinal contours are within normal limits. Both lungs are clear. The visualized skeletal structures are unremarkable.  IMPRESSION: No active cardiopulmonary disease.   Electronically Signed   By: Rolm Baptise M.D.   On: 10/03/2014 19:02  Abdomen 1 View (kub)  10/03/2014   CLINICAL DATA:  Left ureteral calculus.  Preop.  EXAM: ABDOMEN - 1 VIEW  COMPARISON:  CT 10/02/2014  FINDINGS: The 5 mm stone at the left ureteropelvic junction is not significantly changed when compared to prior CT. Are multiple additional nonobstructing calculi within the left kidney, largest measuring 14 mm. There stone seen projecting over the right renal fossa. No definite stones over the course of either ureter. No bladder stones are seen. Pelvic phleboliths are unchanged. There is a nonobstructive bowel gas pattern.  IMPRESSION: Stone at the left ureteropelvic junction measures 5 mm and is not significantly changed. Additional stones in the right and left kidney, similar to prior CT.   Electronically Signed    By: Jeb Levering M.D.   On: 10/03/2014 19:05   Dg C-arm 1-60 Min  10/03/2014   CLINICAL DATA:  Cystoscopy with ureteral stent placement.  EXAM: DG C-ARM 61-120 MIN  COMPARISON:  None.  FINDINGS: Single intraoperative spot image demonstrates the proximal and mid portions of a left ureteral stent. Calcifications project over the mid and lower pole of the left kidney.  IMPRESSION: Left ureteral stent placement.  Left nephrolithiasis.   Electronically Signed   By: Rolm Baptise M.D.   On: 10/03/2014 21:37    Assessment/Plan:   Status post left stent placement for infected, obstructing left upper ureteral stone. She is improving. Urine culture is growing gram-negative rods. She is on appropriate antibiotic management.    We will continue IV antibiotics until culture and sensitivities are back. At that point, I think we can send her home with appropriate oral antibiotics.    I discussed eventual percutaneous management of her left upper ureteral, left renal pelvic and left calyceal stones. We will eventually perform this once she has been adequately treated for her infection.   LOS: 2 days   Franchot Gallo M 10/05/2014, 9:28 AM

## 2014-10-06 DIAGNOSIS — N179 Acute kidney failure, unspecified: Secondary | ICD-10-CM | POA: Diagnosis present

## 2014-10-06 LAB — CBC WITH DIFFERENTIAL/PLATELET
BASOS ABS: 0 10*3/uL (ref 0.0–0.1)
BASOS PCT: 1 % (ref 0–1)
Eosinophils Absolute: 0.2 10*3/uL (ref 0.0–0.7)
Eosinophils Relative: 4 % (ref 0–5)
HCT: 37.7 % (ref 36.0–46.0)
Hemoglobin: 11.8 g/dL — ABNORMAL LOW (ref 12.0–15.0)
LYMPHS ABS: 2.1 10*3/uL (ref 0.7–4.0)
LYMPHS PCT: 37 % (ref 12–46)
MCH: 28.6 pg (ref 26.0–34.0)
MCHC: 31.3 g/dL (ref 30.0–36.0)
MCV: 91.5 fL (ref 78.0–100.0)
MONO ABS: 0.4 10*3/uL (ref 0.1–1.0)
Monocytes Relative: 7 % (ref 3–12)
Neutro Abs: 3 10*3/uL (ref 1.7–7.7)
Neutrophils Relative %: 51 % (ref 43–77)
PLATELETS: 336 10*3/uL (ref 150–400)
RBC: 4.12 MIL/uL (ref 3.87–5.11)
RDW: 14.3 % (ref 11.5–15.5)
WBC: 5.8 10*3/uL (ref 4.0–10.5)

## 2014-10-06 LAB — URINE CULTURE

## 2014-10-06 LAB — BASIC METABOLIC PANEL
Anion gap: 9 (ref 5–15)
BUN: 23 mg/dL (ref 6–23)
CO2: 27 mmol/L (ref 19–32)
Calcium: 8.9 mg/dL (ref 8.4–10.5)
Chloride: 104 mEq/L (ref 96–112)
Creatinine, Ser: 1.48 mg/dL — ABNORMAL HIGH (ref 0.50–1.10)
GFR calc Af Amer: 41 mL/min — ABNORMAL LOW (ref >90)
GFR calc non Af Amer: 35 mL/min — ABNORMAL LOW (ref >90)
GLUCOSE: 147 mg/dL — AB (ref 70–99)
Potassium: 3.8 mmol/L (ref 3.5–5.1)
Sodium: 140 mmol/L (ref 135–145)

## 2014-10-06 MED ORDER — CIPROFLOXACIN HCL 250 MG PO TABS: 500.0000 mg | ORAL_TABLET | Freq: Two times a day (BID) | ORAL | Status: AC

## 2014-10-06 NOTE — Discharge Instructions (Signed)
Ureteral Stent Implantation Ureteral stent implantation is the implantation of a soft plastic tube with multiple holes into the tube that drains urine from your kidney to your bladder (ureter). The stent helps drain your kidney when there is a blockage of the flow of urine in your ureter. The stent has a coil on each end to keep it from falling out. One end stays in the kidney. The other end stays in the bladder. It is most often taken out after any blockage has been removed or your ureter has healed. Short-term stents have a string attached to make removal quite easy. Removal of a short-term stent can be done in your health care provider's office or by you at home. Long-term stents need to be changed every few months. LET [facility] CARE PROVIDER KNOW ABOUT:  Any allergies you have.  All medicines you are taking, including vitamins, herbs, eye drops, creams, and over-the-counter medicines.  Previous problems you or members of your family have had with the use of anesthetics.  Any blood disorders you have.  Previous surgeries you have had.  Medical conditions you have. RISKS AND COMPLICATIONS Generally, ureteral stent implantation is a safe procedure. However, as with any procedure, complications can occur. Possible complications include:  Movement of the stent away from where it was originally placed (migration). This may affect the ability of the stent to properly drain your kidney. If migration of the stent occurs, the stent may need to be replaced or repositioned.  Perforation of the ureter.  Infection. BEFORE THE PROCEDURE  You may be asked to wash your genital area with sterile soap the morning of your procedure.  You may be given an oral antibiotic which you should take with a sip of water as prescribed by your health care provider.  You may be asked to not eat or drink for 8 hours before the surgery. PROCEDURE  First you will be given an anesthetic so you do not feel pain  during the procedure.  Your health care provider will insert a special lighted instrument called a cystoscope into your bladder. This allows your health care provider to see the opening to your ureter.  A thin wire is carefully threaded into your bladder and up the ureter. The stent is inserted over the wire and the wire is then removed.  Your bladder will be emptied of urine. AFTER THE PROCEDURE You will be taken to a recovery room until it is okay for you to go home. Document Released: 09/01/2000 Document Revised: 09/09/2013 Document Reviewed: 02/11/2013 [facility] Patient Information 2015 [place], [place]. This information is not intended to replace advice given to you by your health care provider. Make sure you discuss any questions you have with your health care provider.

## 2014-10-06 NOTE — Discharge Summary (Signed)
Physician Discharge Summary  Patient ID: Natasha Warren MRN: [id number] DOB/AGE: 10/03/1945 69 y.o.  Admit date: 10/03/2014 Discharge date: 10/06/2014  Admission Diagnoses:  Left ureteral calculus  Discharge Diagnoses:  Principal Problem:   Left ureteral calculus Active Problems:   Febrile urinary tract infection   Acute kidney injury   Past Medical History  Diagnosis Date  . Hypertension   . Thyroid disorder   . Irritable bowel syndrome   . Seasonal allergies   . Hypothyroidism   . Colon polyps   . Arthritis   . Hyperlipidemia   . Urolithiasis   . History of gout   . Nocturia   . Hyperparathyroidism, primary   . Renal disorder     kidney stones    Surgeries: Procedure(s): CYSTOSCOPY WITH URETERAL STENT PLACEMENT on 10/03/2014   Consultants (if any):    Discharged Condition: Improved  Hospital Course: Natasha Warren is an 69 y.o. female who was admitted 10/03/2014 with a diagnosis of Left ureteral calculus and febrile UTI with AKI and went to the operating room on 10/03/2014 and underwent the above named procedures.  She was placed on Zosyn and had a declining fever curve and is now afebrile.   Her blood culture is negative but she has GNR on urine culture.  The sensitivities are pending.  Her Cr was elevated to 1.86 on admission and that will be repeated today.  She is going to be discharged on Cipro but adjustments will be made based on the culture.   She will be scheduled for definitive treatment of the stones in the near future.    She was given perioperative antibiotics:  Anti-infectives    Start     Dose/Rate Route Frequency Ordered Stop   10/06/14 0000  ciprofloxacin (CIPRO) 250 MG tablet     500 mg Oral 2 times daily 10/06/14 0808 10/16/14 2359   10/03/14 2200  piperacillin-tazobactam (ZOSYN) IVPB 3.375 g     3.375 g12.5 mL/hr over 240 Minutes Intravenous 3 times per day 10/03/14 1830      .  She was given sequential compression devices for DVT  prophylaxis.  She benefited maximally from the hospital stay and there were no complications.    Recent vital signs:  Filed Vitals:   10/06/14 0605  BP: 125/72  Pulse: 60  Temp: 98.5 F (36.9 C)  Resp: 18    Recent laboratory studies:  Lab Results  Component Value Date   HGB 12.0 10/03/2014   HGB 12.8 10/02/2014   HGB 13.1 08/01/2013   Lab Results  Component Value Date   WBC 12.8* 10/03/2014   PLT 305 10/03/2014   Lab Results  Component Value Date   INR 1.00 07/28/2013   Lab Results  Component Value Date   NA 131* 10/03/2014   K 4.1 10/03/2014   CL 102 10/03/2014   CO2 25 10/03/2014   BUN 30* 10/03/2014   CREATININE 1.86* 10/03/2014   GLUCOSE 132* 10/03/2014    Discharge Medications:     Medication List    STOP taking these medications        Vitamin D (Ergocalciferol) 50000 UNITS Caps capsule  Commonly known as:  DRISDOL      TAKE these medications        acetaminophen 500 MG tablet  Commonly known as:  TYLENOL  Take 1,000 mg by mouth every 6 (six) hours as needed for pain.     cholecalciferol 1000 UNITS tablet  Commonly known as:  VITAMIN  D  Take 1,000 Units by mouth daily.     ciprofloxacin 250 MG tablet  Commonly known as:  CIPRO  Take 2 tablets (500 mg total) by mouth 2 (two) times daily.     levothyroxine 50 MCG tablet  Commonly known as:  SYNTHROID, LEVOTHROID  Take 50 mcg by mouth daily before breakfast.     losartan-hydrochlorothiazide 100-25 MG per tablet  Commonly known as:  HYZAAR  Take 1 tablet by mouth every morning.     metoprolol succinate 50 MG 24 hr tablet  Commonly known as:  TOPROL-XL  Take 50 mg by mouth every morning. Take with or immediately following a meal.     ondansetron 4 MG tablet  Commonly known as:  ZOFRAN  Take 1 tablet (4 mg total) by mouth every 8 (eight) hours as needed for nausea or vomiting.     oxyCODONE-acetaminophen 5-325 MG per tablet  Commonly known as:  PERCOCET  Take 1-2 tablets by mouth  every 4 (four) hours as needed.     tamsulosin 0.4 MG Caps capsule  Commonly known as:  FLOMAX  Take 1 capsule (0.4 mg total) by mouth 2 (two) times daily.        Diagnostic Studies: Ct Abdomen Pelvis Wo Contrast  10/03/2014   CLINICAL DATA:  Acute onset of left lower quadrant abdominal pain and mild nausea. Initial encounter.  EXAM: CT ABDOMEN AND PELVIS WITHOUT CONTRAST  TECHNIQUE: Multidetector CT imaging of the abdomen and pelvis was performed following the standard protocol without IV contrast.  COMPARISON:  Abdominal radiograph performed 02/11/2014, and CT of the abdomen and pelvis from 02/02/2004  FINDINGS: The visualized lung bases are clear.  The liver and spleen are unremarkable in appearance. The patient is status post cholecystectomy, with clips noted along the gallbladder fossa. The pancreas and adrenal glands are unremarkable.  There is minimal left-sided hydronephrosis, with an obstructing 6 x 4 mm stone in the proximal ureter, at the left ureteropelvic junction. Asymmetric left-sided perinephric stranding and fluid are seen.  Mild bilateral renal atrophy and scarring are seen. Multiple nonobstructing bilateral renal stones are seen, more prominent on the left, measuring up to 1.6 cm on the left.  No free fluid is identified. The small bowel is unremarkable in appearance. The stomach is within normal limits. No acute vascular abnormalities are seen.  The patient is status post appendectomy. Minimal diverticulosis is noted along the proximal sigmoid colon, without evidence of diverticulitis.  The bladder is decompressed and not well assessed. The patient is status post hysterectomy. No suspicious adnexal masses are seen. No inguinal lymphadenopathy is seen.  No acute osseous abnormalities are identified. Multilevel vacuum phenomenon is noted along the lumbar spine. Underlying facet disease is noted.  IMPRESSION: 1. Minimal left-sided hydronephrosis, with an obstructing 6 x 4 mm stone in the  proximal left ureter, at the left ureteropelvic junction. Asymmetric left-sided perinephric stranding and fluid noted. 2. Mild bilateral renal atrophy and scarring. Multiple nonobstructing bilateral renal stones, more prominent on the left, measuring up to 1.6 cm. 3. Minimal diverticulosis along the proximal sigmoid colon, without evidence of diverticulitis. 4. Mild degenerative change noted along the lumbar spine.   Electronically Signed   By: Garald Balding M.D.   On: 10/03/2014 00:00   X-ray Chest Pa And Lateral  10/03/2014   CLINICAL DATA:  Left kidney stone, preop.  EXAM: CHEST  2 VIEW  COMPARISON:  09/27/2012  FINDINGS: The heart size and mediastinal contours are within normal limits.  Both lungs are clear. The visualized skeletal structures are unremarkable.  IMPRESSION: No active cardiopulmonary disease.   Electronically Signed   By: Rolm Baptise M.D.   On: 10/03/2014 19:02   Abdomen 1 View (kub)  10/03/2014   CLINICAL DATA:  Left ureteral calculus.  Preop.  EXAM: ABDOMEN - 1 VIEW  COMPARISON:  CT 10/02/2014  FINDINGS: The 5 mm stone at the left ureteropelvic junction is not significantly changed when compared to prior CT. Are multiple additional nonobstructing calculi within the left kidney, largest measuring 14 mm. There stone seen projecting over the right renal fossa. No definite stones over the course of either ureter. No bladder stones are seen. Pelvic phleboliths are unchanged. There is a nonobstructive bowel gas pattern.  IMPRESSION: Stone at the left ureteropelvic junction measures 5 mm and is not significantly changed. Additional stones in the right and left kidney, similar to prior CT.   Electronically Signed   By: Jeb Levering M.D.   On: 10/03/2014 19:05   Dg C-arm 1-60 Min  10/03/2014   CLINICAL DATA:  Cystoscopy with ureteral stent placement.  EXAM: DG C-ARM 61-120 MIN  COMPARISON:  None.  FINDINGS: Single intraoperative spot image demonstrates the proximal and mid portions of a  left ureteral stent. Calcifications project over the mid and lower pole of the left kidney.  IMPRESSION: Left ureteral stent placement.  Left nephrolithiasis.   Electronically Signed   By: Rolm Baptise M.D.   On: 10/03/2014 21:37    Disposition: 01-Home or Self Care      Discharge Instructions    Discontinue IV    Complete by:  As directed            Follow-up Information    Follow up with Jorja Loa, MD.   Specialty:  Urology   Why:  The office will call to arrange follow up   Contact information:   [address] [place] [address] [phone]        Signed: Malka So 10/06/2014, 8:09 AM

## 2014-10-06 NOTE — Progress Notes (Signed)
Patient ID: Natasha Warren, female   DOB: 25-Nov-1945, 69 y.o.   MRN: [id number] 3 Days Post-Op  Subjective: [name] is PO day 3 from a cystoscopy and left ureteral stent insertion for a 94mm left proximal stone with sepsis from a UTI and acute kidney injury with a Cr of 1.86.   Her blood culture is negative.  The urine culture has 45000 col of GNR but the sensitivities are pending.   She is afebrile.  She has minimal back pain from the bed but no other complaints at this time.  ROS:  Review of Systems  Constitutional: Negative for fever and chills.  Respiratory: Negative for shortness of breath.   Cardiovascular: Negative for chest pain.  Gastrointestinal: Negative for nausea and abdominal pain.  Genitourinary: Negative for dysuria, hematuria and flank pain.    Anti-infectives: Anti-infectives    Start     Dose/Rate Route Frequency Ordered Stop   10/03/14 2200  piperacillin-tazobactam (ZOSYN) IVPB 3.375 g     3.375 g12.5 mL/hr over 240 Minutes Intravenous 3 times per day 10/03/14 1830        Current Facility-Administered Medications  Medication Dose Route Frequency Provider Last Rate Last Dose  . acetaminophen (TYLENOL) tablet 650 mg  650 mg Oral Q6H PRN Blanchie Dessert, MD   650 mg at 10/04/14 1542  . bisacodyl (DULCOLAX) suppository 10 mg  10 mg Rectal Daily PRN Malka So, MD      . dextrose 5 % and 0.45 % NaCl with KCl 20 mEq/L infusion   Intravenous Continuous Malka So, MD 100 mL/hr at 10/05/14 1045    . docusate sodium (COLACE) capsule 100 mg  100 mg Oral BID Malka So, MD   100 mg at 10/04/14 0916  . hydrochlorothiazide (HYDRODIURIL) tablet 25 mg  25 mg Oral Daily Malka So, MD   25 mg at 10/05/14 1124  . HYDROmorphone (DILAUDID) injection 0.5-1 mg  0.5-1 mg Intravenous Q2H PRN Malka So, MD      . ibuprofen (ADVIL,MOTRIN) tablet 400 mg  400 mg Oral Q6H PRN Malka So, MD   400 mg at 10/04/14 1821  . Influenza vac split quadrivalent PF (FLUARIX) injection 0.5 mL   0.5 mL Intramuscular Tomorrow-1000 Malka So, MD   0.5 mL at 10/05/14 1000  . lactated ringers infusion   Intravenous Continuous Anne Fu, CRNA   0  at 10/03/14 2140  . levothyroxine (SYNTHROID, LEVOTHROID) tablet 50 mcg  50 mcg Oral QAC breakfast Malka So, MD   50 mcg at 10/05/14 0901  . losartan (COZAAR) tablet 100 mg  100 mg Oral Daily Malka So, MD   100 mg at 10/05/14 1123  . metoprolol succinate (TOPROL-XL) 24 hr tablet 50 mg  50 mg Oral q morning - 10a Malka So, MD   50 mg at 10/05/14 1124  . ondansetron (ZOFRAN) injection 4 mg  4 mg Intravenous Q4H PRN Malka So, MD      . oxybutynin [facility]) tablet 5 mg  5 mg Oral Q8H PRN Malka So, MD      . oxyCODONE-acetaminophen (PERCOCET/ROXICET) 5-325 MG per tablet 1-2 tablet  1-2 tablet Oral Q4H PRN Malka So, MD      . piperacillin-tazobactam (ZOSYN) IVPB 3.375 g  3.375 g Intravenous 3 times per day Malka So, MD   3.375 g at 10/06/14 0534  . zolpidem (AMBIEN) tablet 5 mg  5 mg Oral QHS PRN  Malka So, MD       Facility-Administered Medications Ordered in Other Encounters  Medication Dose Route Frequency Provider Last Rate Last Dose  . acetaminophen (TYLENOL) tablet 650 mg  650 mg Oral Q4H PRN Malka So, MD         Objective: Vital signs in last 24 hours: Temp:  [98.5 F (36.9 C)-99.7 F (37.6 C)] 98.5 F (36.9 C) (01/19 0605) Pulse Rate:  [60-70] 60 (01/19 0605) Resp:  [16-18] 18 (01/19 0605) BP: (111-125)/(59-72) 125/72 mmHg (01/19 0605) SpO2:  [97 %-100 %] 98 % (01/19 0605)  Intake/Output from previous day: 01/18 0701 - 01/19 0700 In: 1571.7 [P.O.:360; I.V.:1061.7; IV Piggyback:150] Out: -  Intake/Output this shift:     Physical Exam  Constitutional: She is well-developed, well-nourished, and in no distress.  Cardiovascular: Normal rate and regular rhythm.   Pulmonary/Chest: Effort normal and breath sounds normal.  Abdominal: Soft. There is no tenderness.  Vitals  reviewed.   Lab Results:   Recent Labs  10/03/14 1857  WBC 12.8*  HGB 12.0  HCT 36.8  PLT 305   BMET  Recent Labs  10/03/14 1857  NA 131*  K 4.1  CL 102  CO2 25  GLUCOSE 132*  BUN 30*  CREATININE 1.86*  CALCIUM 8.7   PT/INR No results for input(s): LABPROT, INR in the last 72 hours. ABG No results for input(s): PHART, HCO3 in the last 72 hours.  Invalid input(s): PCO2, PO2  Studies/Results: No results found.  Blood cultures negative.    Ucx preliminary 45000 GNR  Assessment: s/p Procedure(s): CYSTOSCOPY WITH URETERAL STENT PLACEMENT  Left proximal ureteral stone with sepsis from GNR with AKI now afebrile on Zosyn post stenting.  Plan: I am going to repeat a BMP and CBC today. I will switch her to PO cipro but may have to adjust that based on the final culture results.  She is ok for discharge today after the lab draw.   CC: Dr. Franchot Gallo.     LOS: 3 days    Natasha Warren 10/06/2014

## 2014-10-06 NOTE — Care Management Note (Addendum)
    Page 1 of 1   10/07/2014     12:05:53 PM CARE MANAGEMENT NOTE 10/07/2014  Patient:  Natasha Warren, Natasha Warren   Account Number:  [id number]  Date Initiated:  10/06/2014  Documentation initiated by:  Dessa Phi  Subjective/Objective Assessment:   69 y/o f admitted w/Lureteral calculus.     Action/Plan:   From home.   Anticipated DC Date:  10/07/2014   Anticipated DC Plan:  [place]  CM consult      Choice offered to / List presented to:             Status of service:  Completed, signed off Medicare Important Message given?   (If response is "NO", the following Medicare IM given date fields will be blank) Date Medicare IM given:   Medicare IM given by:   Date Additional Medicare IM given:   Additional Medicare IM given by:    Discharge Disposition:  HOME/SELF CARE  Per UR Regulation:  Reviewed for med. necessity/level of care/duration of stay  If discussed at [facility] of Stay Meetings, dates discussed:    Comments:  10/07/14 Dessa Phi RN BSN NCM [phone] d/c home.  10/06/14 Dessa Phi RN BSN NCM [id number] No d/c needs or orders.

## 2014-10-10 LAB — CULTURE, BLOOD (ROUTINE X 2)
Culture: NO GROWTH
Culture: NO GROWTH

## 2014-11-25 DIAGNOSIS — N12 Tubulo-interstitial nephritis, not specified as acute or chronic: Secondary | ICD-10-CM | POA: Diagnosis not present

## 2014-11-25 DIAGNOSIS — N200 Calculus of kidney: Secondary | ICD-10-CM | POA: Diagnosis not present

## 2014-11-26 ENCOUNTER — Other Ambulatory Visit: Payer: Self-pay | Admitting: Urology

## 2014-11-26 DIAGNOSIS — N200 Calculus of kidney: Secondary | ICD-10-CM

## 2014-12-29 NOTE — Patient Instructions (Signed)
TALITHIA ERCOLE  12/29/2014   Your procedure is scheduled on:    01/07/2015    Report to [facility] Main  Entrance and follow signs to               [place] at     [facility] in with Radiology first at 0730am  Then they will take you to [facility] which is directly behind Radiology.    Call this number if you have problems the morning of surgery [phone]   Remember:  Do not eat food or drink liquids :After Midnight.     Take these medicines the morning of surgery with A SIP OF WATER: synthroid, Toprol, ditropan. Percocet if needed                                 You may not have any metal on your body including hair pins and              piercings  Do not wear jewelry, make-up, lotions, powders or perfumes., deodorant.               Do not wear nail polish.  Do not shave  48 hours prior to surgery.               Do not bring valuables to the hospital. [place].  Contacts, dentures or bridgework may not be worn into surgery.  Leave suitcase in the car. After surgery it may be brought to your room.        Special Instructions: coughing and deep breathing exercises, leg exercises               Please read over the following fact sheets you were given: _____________________________________________________________________             [facility] - Preparing for Surgery Before surgery, you can play an important role.  Because skin is not sterile, your skin needs to be as free of germs as possible.  You can reduce the number of germs on your skin by washing with CHG (chlorahexidine gluconate) soap before surgery.  CHG is an antiseptic cleaner which kills germs and bonds with the skin to continue killing germs even after washing. Please DO NOT use if you have an allergy to CHG or antibacterial soaps.  If your skin becomes reddened/irritated stop using the CHG and inform your nurse when you  arrive at Short Stay. Do not shave (including legs and underarms) for at least 48 hours prior to the first CHG shower.  You may shave your face/neck. Please follow these instructions carefully:  1.  Shower with CHG Soap the night before surgery and the  morning of Surgery.  2.  If you choose to wash your hair, wash your hair first as usual with your  normal  shampoo.  3.  After you shampoo, rinse your hair and body thoroughly to remove the  shampoo.                           4.  Use CHG as you would any other liquid soap.  You can apply chg directly  to the skin and  wash                       Gently with a scrungie or clean washcloth.  5.  Apply the CHG Soap to your body ONLY FROM THE NECK DOWN.   Do not use on face/ open                           Wound or open sores. Avoid contact with eyes, ears mouth and genitals (private parts).                       Wash face,  Genitals (private parts) with your normal soap.             6.  Wash thoroughly, paying special attention to the area where your surgery  will be performed.  7.  Thoroughly rinse your body with warm water from the neck down.  8.  DO NOT shower/wash with your normal soap after using and rinsing off  the CHG Soap.                9.  Pat yourself dry with a clean towel.            10.  Wear clean pajamas.            11.  Place clean sheets on your bed the night of your first shower and do not  sleep with pets. Day of Surgery : Do not apply any lotions/deodorants the morning of surgery.  Please wear clean clothes to the hospital/surgery center.  FAILURE TO FOLLOW THESE INSTRUCTIONS MAY RESULT IN THE CANCELLATION OF YOUR SURGERY PATIENT SIGNATURE_________________________________  NURSE SIGNATURE__________________________________  ________________________________________________________________________  WHAT IS A BLOOD TRANSFUSION? Blood Transfusion Information  A transfusion is the replacement of blood or some of its parts. Blood  is made up of multiple cells which provide different functions.  Red blood cells carry oxygen and are used for blood loss replacement.  White blood cells fight against infection.  Platelets control bleeding.  Plasma helps clot blood.  Other blood products are available for specialized needs, such as hemophilia or other clotting disorders. BEFORE THE TRANSFUSION  Who gives blood for transfusions?   Healthy volunteers who are fully evaluated to make sure their blood is safe. This is blood bank blood. Transfusion therapy is the safest it has ever been in the practice of medicine. Before blood is taken from a donor, a complete history is taken to make sure that person has no history of diseases nor engages in risky social behavior (examples are intravenous drug use or sexual activity with multiple partners). The donor's travel history is screened to minimize risk of transmitting infections, such as malaria. The donated blood is tested for signs of infectious diseases, such as HIV and hepatitis. The blood is then tested to be sure it is compatible with you in order to minimize the chance of a transfusion reaction. If you or a relative donates blood, this is often done in anticipation of surgery and is not appropriate for emergency situations. It takes many days to process the donated blood. RISKS AND COMPLICATIONS Although transfusion therapy is very safe and saves many lives, the main dangers of transfusion include:   Getting an infectious disease.  Developing a transfusion reaction. This is an allergic reaction to something in the blood you were given. Every precaution is taken to prevent this. The decision to  have a blood transfusion has been considered carefully by your caregiver before blood is given. Blood is not given unless the benefits outweigh the risks. AFTER THE TRANSFUSION  Right after receiving a blood transfusion, you will usually feel much better and more energetic. This is  especially true if your red blood cells have gotten low (anemic). The transfusion raises the level of the red blood cells which carry oxygen, and this usually causes an energy increase.  The nurse administering the transfusion will monitor you carefully for complications. HOME CARE INSTRUCTIONS  No special instructions are needed after a transfusion. You may find your energy is better. Speak with your caregiver about any limitations on activity for underlying diseases you may have. SEEK MEDICAL CARE IF:   Your condition is not improving after your transfusion.  You develop redness or irritation at the intravenous (IV) site. SEEK IMMEDIATE MEDICAL CARE IF:  Any of the following symptoms occur over the next 12 hours:  Shaking chills.  You have a temperature by mouth above 102 F (38.9 C), not controlled by medicine.  Chest, back, or muscle pain.  People around you feel you are not acting correctly or are confused.  Shortness of breath or difficulty breathing.  Dizziness and fainting.  You get a rash or develop hives.  You have a decrease in urine output.  Your urine turns a dark color or changes to pink, red, or brown. Any of the following symptoms occur over the next 10 days:  You have a temperature by mouth above 102 F (38.9 C), not controlled by medicine.  Shortness of breath.  Weakness after normal activity.  The white part of the eye turns yellow (jaundice).  You have a decrease in the amount of urine or are urinating less often.  Your urine turns a dark color or changes to pink, red, or brown. Document Released: 09/01/2000 Document Revised: 11/27/2011 Document Reviewed: 04/20/2008 [facility] Patient Information 2014 [place], [place].  _______________________________________________________________________

## 2014-12-30 ENCOUNTER — Encounter (HOSPITAL_COMMUNITY): Payer: Self-pay

## 2014-12-30 ENCOUNTER — Encounter (HOSPITAL_COMMUNITY)
Admission: RE | Admit: 2014-12-30 | Discharge: 2014-12-30 | Disposition: A | Discharge status: Home / Self Care | Payer: Medicare Other | Source: Ambulatory Visit | Attending: Urology | Admitting: Urology

## 2014-12-30 DIAGNOSIS — Z01818 Encounter for other preprocedural examination: Secondary | ICD-10-CM | POA: Diagnosis not present

## 2014-12-30 LAB — BASIC METABOLIC PANEL
ANION GAP: 7 (ref 5–15)
BUN: 32 mg/dL — ABNORMAL HIGH (ref 6–23)
CHLORIDE: 104 mmol/L (ref 96–112)
CO2: 26 mmol/L (ref 19–32)
CREATININE: 1.26 mg/dL — AB (ref 0.50–1.10)
Calcium: 9 mg/dL (ref 8.4–10.5)
GFR calc Af Amer: 50 mL/min — ABNORMAL LOW (ref >90)
GFR, EST NON AFRICAN AMERICAN: 43 mL/min — AB (ref >90)
Glucose, Bld: 91 mg/dL (ref 70–99)
POTASSIUM: 4.3 mmol/L (ref 3.5–5.1)
Sodium: 137 mmol/L (ref 135–145)

## 2014-12-30 LAB — CBC
HCT: 43.3 % (ref 36.0–46.0)
Hemoglobin: 13.9 g/dL (ref 12.0–15.0)
MCH: 29 pg (ref 26.0–34.0)
MCHC: 32.1 g/dL (ref 30.0–36.0)
MCV: 90.4 fL (ref 78.0–100.0)
Platelets: 275 10*3/uL (ref 150–400)
RBC: 4.79 MIL/uL (ref 3.87–5.11)
RDW: 15 % (ref 11.5–15.5)
WBC: 7.8 10*3/uL (ref 4.0–10.5)

## 2014-12-30 LAB — ABO/RH: ABO/RH(D): A POS

## 2014-12-30 NOTE — Progress Notes (Signed)
Called and left a message on the voice mail of Natasha Warren regarding the fact taht patient complained of burning on urination today on preop appointment.  Also asked patient to call the office of Dr Diona Fanti regarding this.  Patient voiced understanding.

## 2014-12-30 NOTE — Progress Notes (Signed)
BMP done 12/30/2014 faxed via EPIC to Dr Diona Fanti.

## 2014-12-30 NOTE — Progress Notes (Signed)
EKG- 10/04/13 EPIC  CXR- 10/03/14 EPIC

## 2014-12-30 NOTE — Progress Notes (Signed)
Placed on chart LOV from PCP- Dr Rory Percy in [place], [place] that states no drug allergies listed.  Placed this info in EPIC under drug allergies.

## 2015-01-06 ENCOUNTER — Other Ambulatory Visit: Payer: Self-pay | Admitting: Radiology

## 2015-01-06 MED ORDER — GENTAMICIN SULFATE 40 MG/ML IJ SOLN
5.0000 mg/kg | INTRAMUSCULAR | Status: AC
  Administered 2015-01-07: 360 mg via INTRAVENOUS
  Filled 2015-01-06: qty 9

## 2015-01-06 MED ORDER — GENTAMICIN SULFATE 40 MG/ML IJ SOLN
5.0000 mg/kg | INTRAVENOUS | Status: DC
  Filled 2015-01-06: qty 9

## 2015-01-07 ENCOUNTER — Ambulatory Visit (HOSPITAL_COMMUNITY): Payer: Medicare Other

## 2015-01-07 ENCOUNTER — Ambulatory Visit (HOSPITAL_COMMUNITY): Payer: Medicare Other | Admitting: Anesthesiology

## 2015-01-07 ENCOUNTER — Encounter (HOSPITAL_COMMUNITY): Payer: Self-pay | Admitting: Certified Registered"

## 2015-01-07 ENCOUNTER — Ambulatory Visit (HOSPITAL_COMMUNITY)
Admission: RE | Admit: 2015-01-07 | Discharge: 2015-01-07 | Disposition: A | Discharge status: Home / Self Care | Payer: Medicare Other | Source: Ambulatory Visit | Attending: Urology | Admitting: Urology

## 2015-01-07 ENCOUNTER — Encounter (HOSPITAL_COMMUNITY): Payer: Self-pay

## 2015-01-07 ENCOUNTER — Encounter (HOSPITAL_COMMUNITY)
Admission: RE | Disposition: A | Discharge status: Home / Self Care | Payer: Self-pay | Source: Ambulatory Visit | Attending: Urology

## 2015-01-07 ENCOUNTER — Observation Stay (HOSPITAL_COMMUNITY)
Admission: RE | Admit: 2015-01-07 | Discharge: 2015-01-09 | Disposition: A | Discharge status: Home / Self Care | Payer: Medicare Other | Source: Ambulatory Visit | Attending: Urology | Admitting: Urology

## 2015-01-07 ENCOUNTER — Encounter (HOSPITAL_COMMUNITY): Payer: Self-pay | Admitting: Anesthesiology

## 2015-01-07 DIAGNOSIS — I10 Essential (primary) hypertension: Secondary | ICD-10-CM | POA: Insufficient documentation

## 2015-01-07 DIAGNOSIS — E039 Hypothyroidism, unspecified: Secondary | ICD-10-CM | POA: Diagnosis not present

## 2015-01-07 DIAGNOSIS — N200 Calculus of kidney: Secondary | ICD-10-CM | POA: Diagnosis not present

## 2015-01-07 DIAGNOSIS — M109 Gout, unspecified: Secondary | ICD-10-CM | POA: Insufficient documentation

## 2015-01-07 DIAGNOSIS — K589 Irritable bowel syndrome without diarrhea: Secondary | ICD-10-CM | POA: Insufficient documentation

## 2015-01-07 DIAGNOSIS — Z87440 Personal history of urinary (tract) infections: Secondary | ICD-10-CM | POA: Insufficient documentation

## 2015-01-07 DIAGNOSIS — N132 Hydronephrosis with renal and ureteral calculous obstruction: Secondary | ICD-10-CM | POA: Diagnosis not present

## 2015-01-07 DIAGNOSIS — E785 Hyperlipidemia, unspecified: Secondary | ICD-10-CM | POA: Insufficient documentation

## 2015-01-07 DIAGNOSIS — N202 Calculus of kidney with calculus of ureter: Secondary | ICD-10-CM | POA: Diagnosis present

## 2015-01-07 DIAGNOSIS — Z888 Allergy status to other drugs, medicaments and biological substances status: Secondary | ICD-10-CM | POA: Diagnosis not present

## 2015-01-07 HISTORY — PX: HOLMIUM LASER APPLICATION: SHX5852

## 2015-01-07 HISTORY — PX: NEPHROLITHOTOMY: SHX5134

## 2015-01-07 LAB — CBC
HEMATOCRIT: 40.5 % (ref 36.0–46.0)
Hemoglobin: 12.8 g/dL (ref 12.0–15.0)
MCH: 28.3 pg (ref 26.0–34.0)
MCHC: 31.6 g/dL (ref 30.0–36.0)
MCV: 89.6 fL (ref 78.0–100.0)
Platelets: 288 10*3/uL (ref 150–400)
RBC: 4.52 MIL/uL (ref 3.87–5.11)
RDW: 14.8 % (ref 11.5–15.5)
WBC: 7.1 10*3/uL (ref 4.0–10.5)

## 2015-01-07 LAB — PROTIME-INR
INR: 1.05 (ref 0.00–1.49)
Prothrombin Time: 13.8 seconds (ref 11.6–15.2)

## 2015-01-07 LAB — BASIC METABOLIC PANEL
ANION GAP: 4 — AB (ref 5–15)
BUN: 27 mg/dL — ABNORMAL HIGH (ref 6–23)
CALCIUM: 8.9 mg/dL (ref 8.4–10.5)
CO2: 24 mmol/L (ref 19–32)
Chloride: 108 mmol/L (ref 96–112)
Creatinine, Ser: 1.34 mg/dL — ABNORMAL HIGH (ref 0.50–1.10)
GFR calc Af Amer: 46 mL/min — ABNORMAL LOW (ref >90)
GFR calc non Af Amer: 40 mL/min — ABNORMAL LOW (ref >90)
Glucose, Bld: 115 mg/dL — ABNORMAL HIGH (ref 70–99)
Potassium: 3.5 mmol/L (ref 3.5–5.1)
Sodium: 136 mmol/L (ref 135–145)

## 2015-01-07 LAB — TYPE AND SCREEN
ABO/RH(D): A POS
ANTIBODY SCREEN: NEGATIVE

## 2015-01-07 LAB — APTT: APTT: 29 s (ref 24–37)

## 2015-01-07 IMAGING — XA IR URETURAL STENT LEFT NEW ACCESS W/O SEP NEPHROSTOMY CATH
1 series · 3 of 3 positions shown · non-contrast
Comparison: none

CLINICAL DATA: Symptomatic left nephrolithiasis, planned
percutaneous nephrolithotomy

EXAM:
LEFT PERCUTANEOUS NEPHROURETERAL CATHETER PLACEMENT X2 UNDER
FLUOROSCOPIC GUIDANCE
FLUOROSCOPY TIME:  19.5 minutes, 15 12 mGy
TECHNIQUE: The procedure, risks (including but not limited to bleeding,
infection, organ damage ), benefits, and alternatives were explained
to the patient. Questions regarding the procedure were encouraged
and answered. The patient understands and consents to the procedure.

[Series 300: tube placements · 3 of 3 slices shown]
[im 1/3]
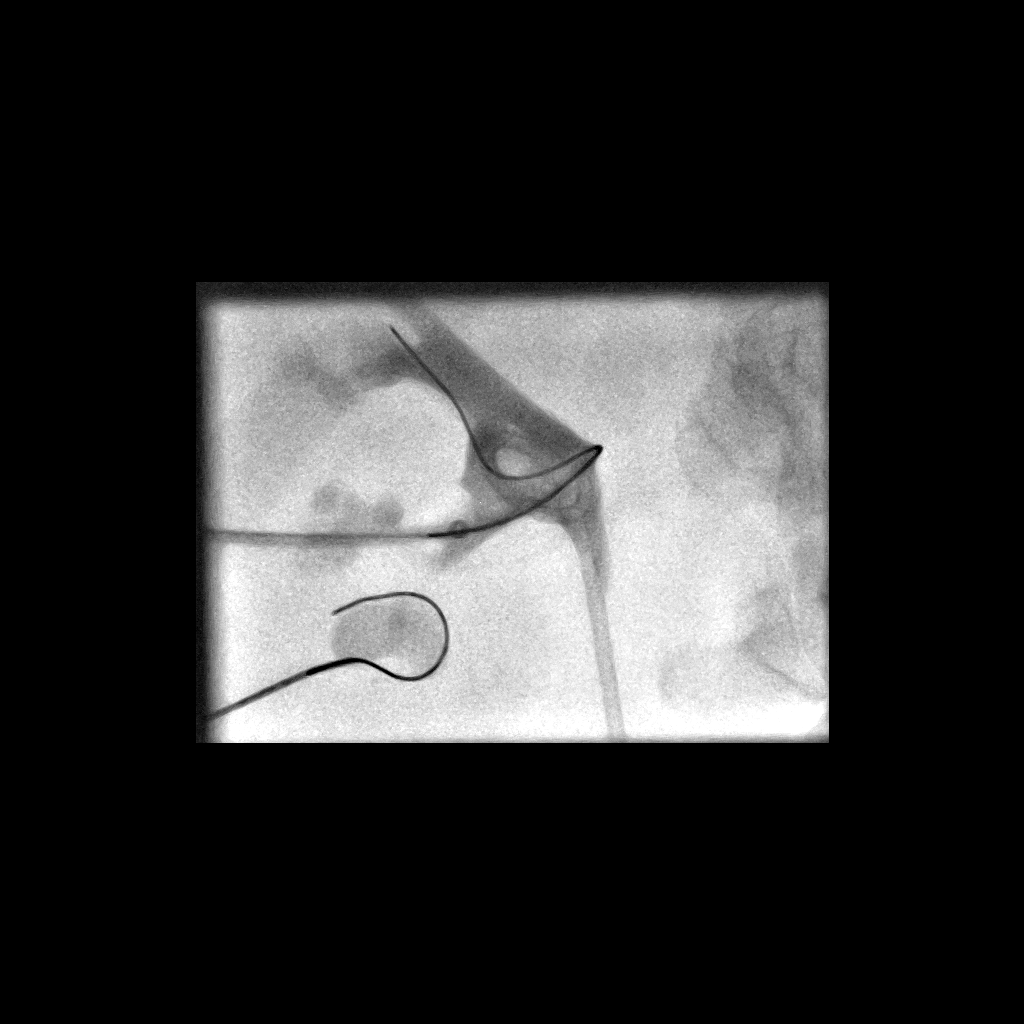
[im 2/3]
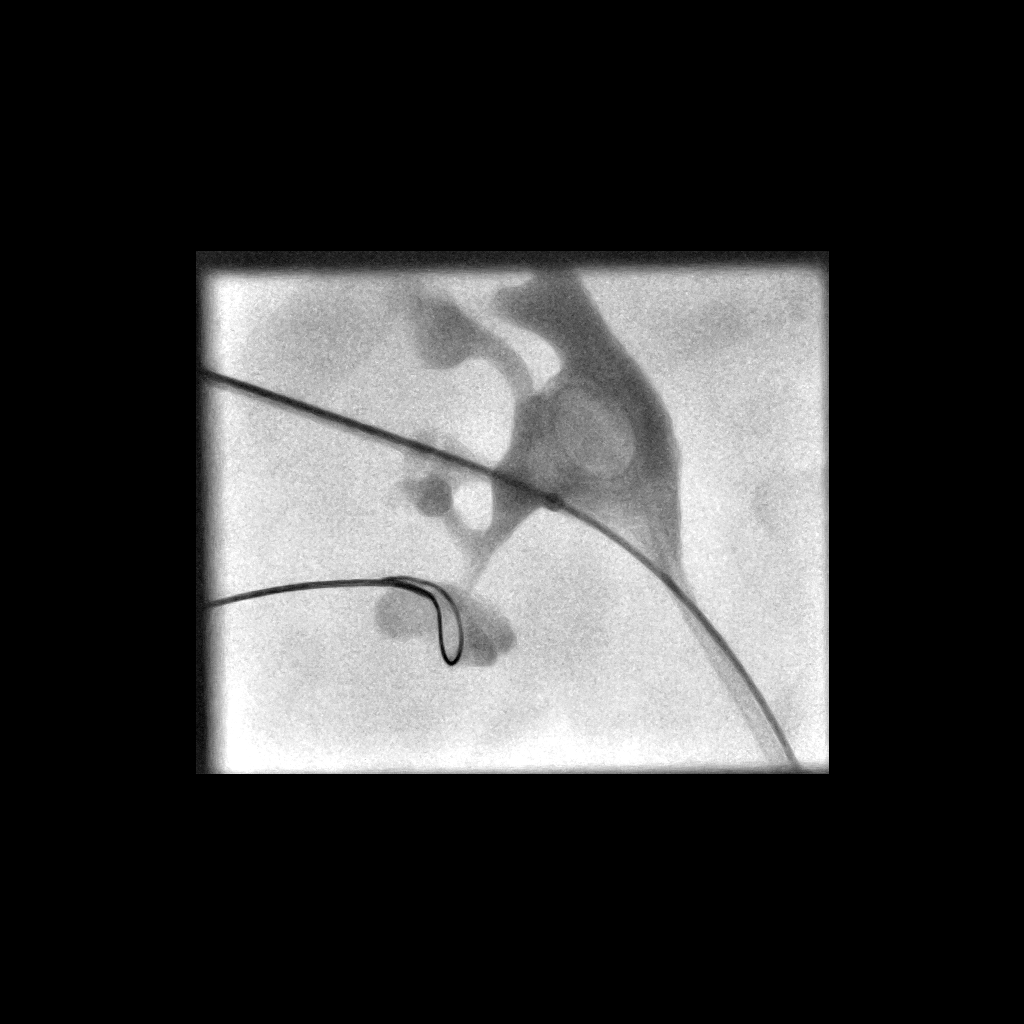
[im 3/3]
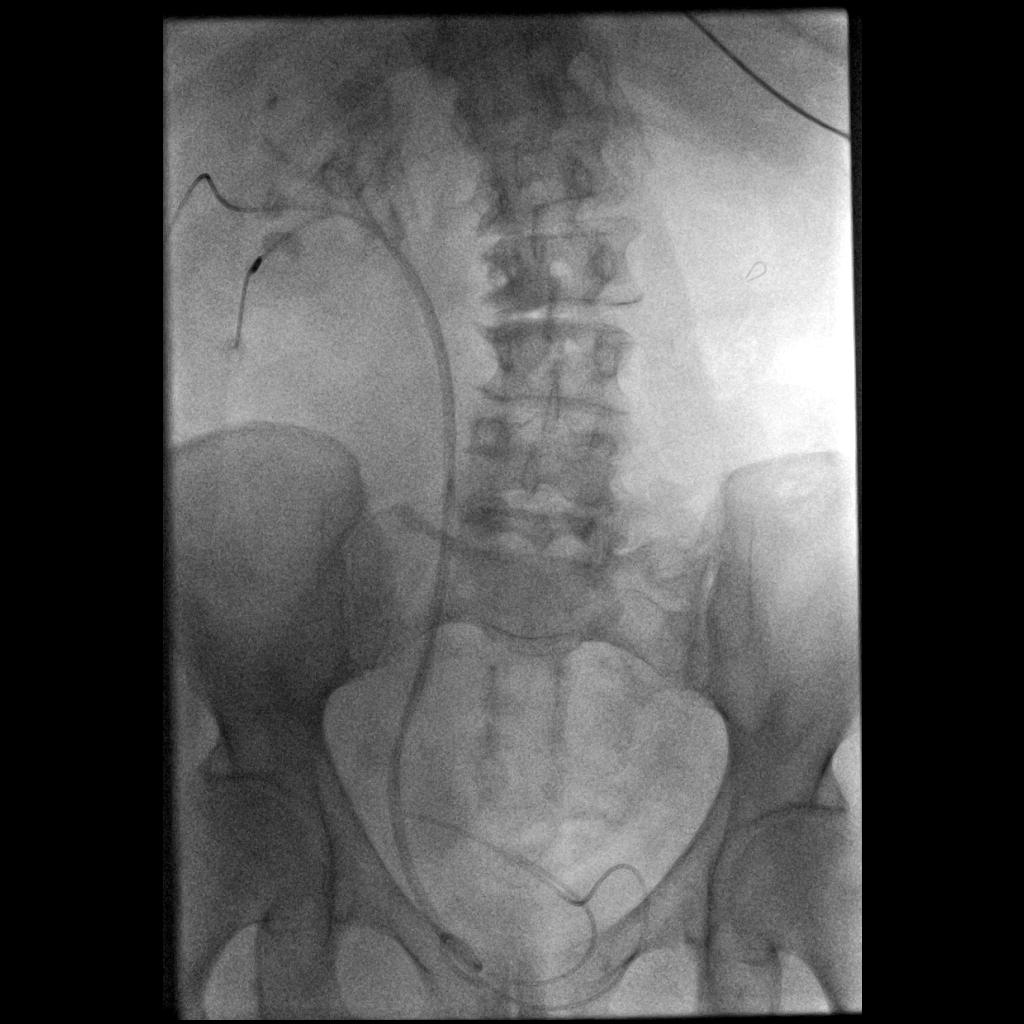

[3 of 3 positions shown; findings below may reference images not displayed]

After laterality was marked, the left flank region prepped with
Betadine, draped in usual sterile fashion, infiltrated locally with
1% lidocaine.

Intravenous Fentanyl and Versed were administered as conscious
sedation during continuous cardiorespiratory monitoring by the
radiology RN, with a total moderate sedation time of 46 minutes.

Under real-time fluoroscopic guidance, a 21-gauge trocar needle was
advanced into a lower pole calyx using the peripheral radiodense
calculus as a guide. A 018 guidewire advanced centrally but remained
partially coiled. Attention was then turned to the other dominant
midpole calculus.

Under real-time fluoroscopic guidance, a 21-gauge Chiba needle was
advanced into a posterior mid pole calyx using the peripheral
radiodense calculus as a guide. A 018 guidewire advanced easily down
the ureter. Needle was exchanged over a guidewire for transitional
dilator. Contrast injection confirmed appropriate positioning, and
demonstrates the narrowed infundibulum from the lower pole. Catheter
was exchanged over a guidewire for a 5 French Kumpe catheter,
advanced into the urinary bladder. Radiograph confirms appropriate
nephroureteral catheter positioning.

The lower pole Needle was exchanged over a guidewire for
transitional dilator. With the aid of an angled Glidewire, access to
the central collecting system and ureter were achieved. A stiff wire
and coaxial 9 French peel-away sheath assisted in advancing the 5
French catheter through the ureter to the urinary bladder.
Radiograph confirms appropriate positioning of both nephroureteral
catheters . Catheters capped and secured externally. The patient
tolerated the procedure well.

COMPLICATIONS:
COMPLICATIONS
none
IMPRESSION: 1. Technically successful left percutaneous nephroureteral catheter
placement x2, in preparation for percutaneous nephrolithotomy.
I telephoned the results to Dr. DALLIN at the time of
interpretation.

## 2015-01-07 SURGERY — NEPHROLITHOTOMY PERCUTANEOUS
Anesthesia: General | Laterality: Left

## 2015-01-07 MED ORDER — MIDAZOLAM HCL 2 MG/2ML IJ SOLN
INTRAMUSCULAR | Status: AC
  Filled 2015-01-07: qty 6

## 2015-01-07 MED ORDER — GLYCOPYRROLATE 0.2 MG/ML IJ SOLN
INTRAMUSCULAR | Status: AC
  Filled 2015-01-07: qty 2

## 2015-01-07 MED ORDER — HYDROMORPHONE HCL 1 MG/ML IJ SOLN
INTRAMUSCULAR | Status: AC
  Filled 2015-01-07: qty 1

## 2015-01-07 MED ORDER — SODIUM CHLORIDE 0.9 % IR SOLN
Status: DC | PRN
  Administered 2015-01-07 (×2): 3000 mL
  Administered 2015-01-07: 9000 mL

## 2015-01-07 MED ORDER — CIPROFLOXACIN IN D5W 400 MG/200ML IV SOLN
400.0000 mg | Freq: Once | INTRAVENOUS | Status: AC
  Administered 2015-01-07: 400 mg via INTRAVENOUS

## 2015-01-07 MED ORDER — ONDANSETRON HCL 4 MG/2ML IJ SOLN
INTRAMUSCULAR | Status: AC
  Filled 2015-01-07: qty 2

## 2015-01-07 MED ORDER — LACTATED RINGERS IV SOLN
INTRAVENOUS | Status: DC
  Administered 2015-01-07 (×3): via INTRAVENOUS
  Administered 2015-01-07: 1000 mL via INTRAVENOUS

## 2015-01-07 MED ORDER — CIPROFLOXACIN IN D5W 400 MG/200ML IV SOLN
INTRAVENOUS | Status: AC
  Filled 2015-01-07: qty 200

## 2015-01-07 MED ORDER — HYDROCHLOROTHIAZIDE 25 MG PO TABS
25.0000 mg | ORAL_TABLET | Freq: Every day | ORAL | Status: DC
  Administered 2015-01-08 – 2015-01-09 (×2): 25 mg via ORAL
  Filled 2015-01-07 (×2): qty 1

## 2015-01-07 MED ORDER — FENTANYL CITRATE (PF) 100 MCG/2ML IJ SOLN
INTRAMUSCULAR | Status: DC | PRN
  Administered 2015-01-07 (×5): 50 ug via INTRAVENOUS

## 2015-01-07 MED ORDER — LIDOCAINE HCL (CARDIAC) 20 MG/ML IV SOLN
INTRAVENOUS | Status: DC | PRN
  Administered 2015-01-07 (×2): 50 mg via INTRAVENOUS

## 2015-01-07 MED ORDER — HYDROMORPHONE HCL 1 MG/ML IJ SOLN
INTRAMUSCULAR | Status: DC | PRN
  Administered 2015-01-07: 1 mg via INTRAVENOUS
  Administered 2015-01-07 (×2): 0.5 mg via INTRAVENOUS

## 2015-01-07 MED ORDER — MEPERIDINE HCL 50 MG/ML IJ SOLN: 6.2500 mg | INTRAMUSCULAR | Status: DC | PRN

## 2015-01-07 MED ORDER — FENTANYL CITRATE (PF) 250 MCG/5ML IJ SOLN
INTRAMUSCULAR | Status: AC
  Filled 2015-01-07: qty 5

## 2015-01-07 MED ORDER — HYDROCODONE-ACETAMINOPHEN 5-325 MG PO TABS
1.0000 | ORAL_TABLET | ORAL | Status: DC | PRN
  Administered 2015-01-08: 1 via ORAL
  Administered 2015-01-08: 2 via ORAL
  Administered 2015-01-08: 1 via ORAL
  Filled 2015-01-07: qty 2
  Filled 2015-01-07 (×2): qty 1

## 2015-01-07 MED ORDER — PHENYLEPHRINE 40 MCG/ML (10ML) SYRINGE FOR IV PUSH (FOR BLOOD PRESSURE SUPPORT)
PREFILLED_SYRINGE | INTRAVENOUS | Status: AC
  Filled 2015-01-07: qty 10

## 2015-01-07 MED ORDER — NEOSTIGMINE METHYLSULFATE 10 MG/10ML IV SOLN
INTRAVENOUS | Status: AC
  Filled 2015-01-07: qty 1

## 2015-01-07 MED ORDER — FENTANYL CITRATE (PF) 100 MCG/2ML IJ SOLN
INTRAMUSCULAR | Status: AC | PRN
  Administered 2015-01-07 (×7): 25 ug via INTRAVENOUS

## 2015-01-07 MED ORDER — LOSARTAN POTASSIUM 50 MG PO TABS
100.0000 mg | ORAL_TABLET | Freq: Every day | ORAL | Status: DC
  Administered 2015-01-08 – 2015-01-09 (×2): 100 mg via ORAL
  Filled 2015-01-07 (×2): qty 2

## 2015-01-07 MED ORDER — IOHEXOL 300 MG/ML  SOLN
INTRAMUSCULAR | Status: DC | PRN
  Administered 2015-01-07: 20 mL

## 2015-01-07 MED ORDER — IOHEXOL 300 MG/ML  SOLN
25.0000 mL | Freq: Once | INTRAMUSCULAR | Status: AC | PRN
  Administered 2015-01-07: 25 mL via INTRAVENOUS

## 2015-01-07 MED ORDER — ACETAMINOPHEN 325 MG PO TABS: 650.0000 mg | ORAL_TABLET | ORAL | Status: DC | PRN

## 2015-01-07 MED ORDER — LIDOCAINE HCL (CARDIAC) 20 MG/ML IV SOLN
INTRAVENOUS | Status: AC
  Filled 2015-01-07: qty 5

## 2015-01-07 MED ORDER — PROPOFOL 10 MG/ML IV BOLUS
INTRAVENOUS | Status: DC | PRN
  Administered 2015-01-07: 180 mg via INTRAVENOUS

## 2015-01-07 MED ORDER — CEFAZOLIN SODIUM-DEXTROSE 2-3 GM-% IV SOLR: 2.0000 g | Freq: Once | INTRAVENOUS | Status: DC

## 2015-01-07 MED ORDER — LIDOCAINE HCL 1 % IJ SOLN
INTRAMUSCULAR | Status: AC
  Filled 2015-01-07: qty 20

## 2015-01-07 MED ORDER — HYDROMORPHONE HCL 1 MG/ML IJ SOLN
0.2500 mg | INTRAMUSCULAR | Status: DC | PRN
  Administered 2015-01-07 (×2): 0.5 mg via INTRAVENOUS

## 2015-01-07 MED ORDER — FENTANYL CITRATE (PF) 100 MCG/2ML IJ SOLN
25.0000 ug | INTRAMUSCULAR | Status: DC | PRN
  Administered 2015-01-07 (×4): 25 ug via INTRAVENOUS

## 2015-01-07 MED ORDER — FENTANYL CITRATE (PF) 100 MCG/2ML IJ SOLN
INTRAMUSCULAR | Status: AC
  Filled 2015-01-07: qty 2

## 2015-01-07 MED ORDER — LOSARTAN POTASSIUM-HCTZ 100-25 MG PO TABS: 1.0000 | ORAL_TABLET | Freq: Every morning | ORAL | Status: DC

## 2015-01-07 MED ORDER — ONDANSETRON HCL 4 MG/2ML IJ SOLN
INTRAMUSCULAR | Status: DC | PRN
  Administered 2015-01-07: 4 mg via INTRAVENOUS

## 2015-01-07 MED ORDER — ROCURONIUM BROMIDE 100 MG/10ML IV SOLN
INTRAVENOUS | Status: AC
  Filled 2015-01-07: qty 1

## 2015-01-07 MED ORDER — ONDANSETRON HCL 4 MG/2ML IJ SOLN
4.0000 mg | INTRAMUSCULAR | Status: DC | PRN
Start: 2015-01-07 — End: 2015-01-09

## 2015-01-07 MED ORDER — PHENYLEPHRINE HCL 10 MG/ML IJ SOLN
INTRAMUSCULAR | Status: DC | PRN
  Administered 2015-01-07 (×2): 80 ug via INTRAVENOUS

## 2015-01-07 MED ORDER — MIDAZOLAM HCL 2 MG/2ML IJ SOLN
INTRAMUSCULAR | Status: AC | PRN
  Administered 2015-01-07 (×2): 0.5 mg via INTRAVENOUS
  Administered 2015-01-07: 1 mg via INTRAVENOUS
  Administered 2015-01-07 (×5): 0.5 mg via INTRAVENOUS

## 2015-01-07 MED ORDER — DEXAMETHASONE SODIUM PHOSPHATE 10 MG/ML IJ SOLN
INTRAMUSCULAR | Status: DC | PRN
  Administered 2015-01-07: 10 mg via INTRAVENOUS

## 2015-01-07 MED ORDER — PROMETHAZINE HCL 25 MG/ML IJ SOLN
INTRAMUSCULAR | Status: AC
  Filled 2015-01-07: qty 1

## 2015-01-07 MED ORDER — SUCCINYLCHOLINE CHLORIDE 20 MG/ML IJ SOLN
INTRAMUSCULAR | Status: DC | PRN
  Administered 2015-01-07: 100 mg via INTRAVENOUS

## 2015-01-07 MED ORDER — METOPROLOL SUCCINATE ER 50 MG PO TB24
50.0000 mg | ORAL_TABLET | Freq: Every morning | ORAL | Status: DC
  Administered 2015-01-08 – 2015-01-09 (×2): 50 mg via ORAL
  Filled 2015-01-07 (×2): qty 1

## 2015-01-07 MED ORDER — HYDROMORPHONE HCL 1 MG/ML IJ SOLN
0.5000 mg | INTRAMUSCULAR | Status: DC | PRN
  Administered 2015-01-07 – 2015-01-08 (×2): 1 mg via INTRAVENOUS
  Filled 2015-01-07 (×2): qty 1

## 2015-01-07 MED ORDER — FENTANYL CITRATE (PF) 100 MCG/2ML IJ SOLN
INTRAMUSCULAR | Status: AC
  Filled 2015-01-07: qty 4

## 2015-01-07 MED ORDER — NEOSTIGMINE METHYLSULFATE 10 MG/10ML IV SOLN
INTRAVENOUS | Status: DC | PRN
  Administered 2015-01-07: 3 mg via INTRAVENOUS

## 2015-01-07 MED ORDER — ROCURONIUM BROMIDE 100 MG/10ML IV SOLN
INTRAVENOUS | Status: DC | PRN
  Administered 2015-01-07: 10 mg via INTRAVENOUS
  Administered 2015-01-07: 5 mg via INTRAVENOUS
  Administered 2015-01-07: 25 mg via INTRAVENOUS
  Administered 2015-01-07: 10 mg via INTRAVENOUS

## 2015-01-07 MED ORDER — SODIUM CHLORIDE 0.9 % IJ SOLN
INTRAMUSCULAR | Status: AC
Start: 2015-01-07 — End: 2015-01-08
  Filled 2015-01-07: qty 10

## 2015-01-07 MED ORDER — PROMETHAZINE HCL 25 MG/ML IJ SOLN
6.2500 mg | INTRAMUSCULAR | Status: DC | PRN
  Administered 2015-01-07: 6.25 mg via INTRAVENOUS

## 2015-01-07 MED ORDER — LEVOTHYROXINE SODIUM 50 MCG PO TABS
50.0000 ug | ORAL_TABLET | Freq: Every day | ORAL | Status: DC
  Administered 2015-01-08 – 2015-01-09 (×2): 50 ug via ORAL
  Filled 2015-01-07 (×2): qty 1

## 2015-01-07 MED ORDER — MIDAZOLAM HCL 2 MG/2ML IJ SOLN
INTRAMUSCULAR | Status: AC
  Filled 2015-01-07: qty 2

## 2015-01-07 MED ORDER — LORAZEPAM 0.5 MG PO TABS
0.5000 mg | ORAL_TABLET | Freq: Every day | ORAL | Status: DC
  Administered 2015-01-07 – 2015-01-08 (×2): 0.5 mg via ORAL
  Filled 2015-01-07 (×2): qty 1

## 2015-01-07 MED ORDER — OXYCODONE-ACETAMINOPHEN 5-325 MG PO TABS: 1.0000 | ORAL_TABLET | ORAL | Status: DC | PRN

## 2015-01-07 MED ORDER — SULFAMETHOXAZOLE-TRIMETHOPRIM 800-160 MG PO TABS
1.0000 | ORAL_TABLET | Freq: Two times a day (BID) | ORAL | Status: DC
  Administered 2015-01-07 – 2015-01-09 (×4): 1 via ORAL
  Filled 2015-01-07 (×4): qty 1

## 2015-01-07 MED ORDER — HYDROMORPHONE HCL 2 MG/ML IJ SOLN
INTRAMUSCULAR | Status: AC
  Filled 2015-01-07: qty 1

## 2015-01-07 MED ORDER — SODIUM CHLORIDE 0.45 % IV SOLN
INTRAVENOUS | Status: DC
  Administered 2015-01-08: 04:00:00 via INTRAVENOUS
  Administered 2015-01-08: 75 mL/h via INTRAVENOUS

## 2015-01-07 MED ORDER — DEXAMETHASONE SODIUM PHOSPHATE 10 MG/ML IJ SOLN
INTRAMUSCULAR | Status: AC
  Filled 2015-01-07: qty 1

## 2015-01-07 MED ORDER — PROPOFOL 10 MG/ML IV BOLUS
INTRAVENOUS | Status: AC
  Filled 2015-01-07: qty 20

## 2015-01-07 MED ORDER — ZOLPIDEM TARTRATE 5 MG PO TABS: 5.0000 mg | ORAL_TABLET | Freq: Every evening | ORAL | Status: DC | PRN

## 2015-01-07 MED ORDER — DOCUSATE SODIUM 100 MG PO CAPS
100.0000 mg | ORAL_CAPSULE | Freq: Two times a day (BID) | ORAL | Status: DC
  Administered 2015-01-07 – 2015-01-09 (×4): 100 mg via ORAL
  Filled 2015-01-07 (×4): qty 1

## 2015-01-07 MED ORDER — GLYCOPYRROLATE 0.2 MG/ML IJ SOLN
INTRAMUSCULAR | Status: DC | PRN
  Administered 2015-01-07: 0.4 mg via INTRAVENOUS

## 2015-01-07 MED ORDER — OXYBUTYNIN CHLORIDE 5 MG PO TABS
5.0000 mg | ORAL_TABLET | Freq: Three times a day (TID) | ORAL | Status: DC
  Administered 2015-01-07 – 2015-01-09 (×5): 5 mg via ORAL
  Filled 2015-01-07 (×7): qty 1

## 2015-01-07 MED ORDER — SODIUM CHLORIDE 0.9 % IV SOLN
Freq: Once | INTRAVENOUS | Status: AC
  Administered 2015-01-07: 08:00:00 via INTRAVENOUS

## 2015-01-07 SURGICAL SUPPLY — 52 items
APL SKNCLS STERI-STRIP NONHPOA (GAUZE/BANDAGES/DRESSINGS) ×2
BAG URINE DRAINAGE (UROLOGICAL SUPPLIES) ×3 IMPLANT
BASKET ZERO TIP NITINOL 2.4FR (BASKET) ×3 IMPLANT
BENZOIN TINCTURE PRP APPL 2/3 (GAUZE/BANDAGES/DRESSINGS) ×6 IMPLANT
BLADE SURG 15 STRL LF DISP TIS (BLADE) ×1 IMPLANT
BLADE SURG 15 STRL SS (BLADE) ×2
CARTRIDGE STONEBREAK CO2 KIDNE (ELECTROSURGICAL) IMPLANT
CATH FOLEY 2W COUNCIL 20FR 5CC (CATHETERS) IMPLANT
CATH MULTI PURPOSE 16FR DRAIN (STENTS) ×3 IMPLANT
CATH ROBINSON RED A/P 20FR (CATHETERS) IMPLANT
CATH X-FORCE N30 NEPHROSTOMY (TUBING) ×6 IMPLANT
COVER SURGICAL LIGHT HANDLE (MISCELLANEOUS) ×3 IMPLANT
DRAPE C-ARM 42X120 X-RAY (DRAPES) ×3 IMPLANT
DRAPE CAMERA CLOSED 9X96 (DRAPES) ×3 IMPLANT
DRAPE LINGEMAN PERC (DRAPES) ×3 IMPLANT
DRAPE SURG IRRIG POUCH 19X23 (DRAPES) ×3 IMPLANT
DRSG PAD ABDOMINAL 8X10 ST (GAUZE/BANDAGES/DRESSINGS) ×6 IMPLANT
DRSG TEGADERM 8X12 (GAUZE/BANDAGES/DRESSINGS) ×3 IMPLANT
FIBER LASER FLEXIVA 1000 (UROLOGICAL SUPPLIES) IMPLANT
FIBER LASER FLEXIVA 200 (UROLOGICAL SUPPLIES) IMPLANT
FIBER LASER FLEXIVA 365 (UROLOGICAL SUPPLIES) ×3 IMPLANT
FIBER LASER FLEXIVA 550 (UROLOGICAL SUPPLIES) IMPLANT
FIBER LASER TRAC TIP (UROLOGICAL SUPPLIES) IMPLANT
GAUZE SPONGE 4X4 12PLY STRL (GAUZE/BANDAGES/DRESSINGS) ×3 IMPLANT
GLOVE BIOGEL M 8.0 STRL (GLOVE) ×3 IMPLANT
GOWN STRL REUS W/TWL XL LVL3 (GOWN DISPOSABLE) ×3 IMPLANT
GUIDEWIRE AMPLAZ .035X145 (WIRE) ×6 IMPLANT
GUIDEWIRE STR DUAL SENSOR (WIRE) ×3 IMPLANT
KIT BASIN OR (CUSTOM PROCEDURE TRAY) ×3 IMPLANT
MANIFOLD NEPTUNE II (INSTRUMENTS) ×3 IMPLANT
NS IRRIG 1000ML POUR BTL (IV SOLUTION) ×3 IMPLANT
PACK BASIC VI WITH GOWN DISP (CUSTOM PROCEDURE TRAY) ×3 IMPLANT
PAD ABD 8X10 STRL (GAUZE/BANDAGES/DRESSINGS) ×3 IMPLANT
PROBE KIDNEY STONEBRKR 2.0X425 (ELECTROSURGICAL) IMPLANT
PROBE LITHOCLAST ULTRA 3.8X403 (UROLOGICAL SUPPLIES) ×3 IMPLANT
PROBE PNEUMATIC 1.0MMX570MM (UROLOGICAL SUPPLIES) ×3 IMPLANT
SET IRRIG Y TYPE TUR BLADDER L (SET/KITS/TRAYS/PACK) ×3 IMPLANT
SET WARMING FLUID IRRIGATION (MISCELLANEOUS) IMPLANT
SHEATH PEELAWAY SET 9 (SHEATH) ×3 IMPLANT
SHIELD EYE BINOCULAR (MISCELLANEOUS) IMPLANT
SPONGE LAP 4X18 X RAY DECT (DISPOSABLE) ×3 IMPLANT
STENT URET 6FRX24 CONTOUR (STENTS) ×3 IMPLANT
STONE CATCHER W/TUBE ADAPTER (UROLOGICAL SUPPLIES) IMPLANT
SUT SILK 2 0 30  PSL (SUTURE) ×2
SUT SILK 2 0 30 PSL (SUTURE) ×1 IMPLANT
SYR 20CC LL (SYRINGE) ×6 IMPLANT
SYRINGE 10CC LL (SYRINGE) ×3 IMPLANT
TRAY FOLEY BAG SILVER LF 14FR (CATHETERS) ×3 IMPLANT
TRAY FOLEY W/METER SILVER 14FR (SET/KITS/TRAYS/PACK) ×3 IMPLANT
TUBING CONNECTING 10 (TUBING) ×4 IMPLANT
TUBING CONNECTING 10' (TUBING) ×2
WATER STERILE IRR 1500ML POUR (IV SOLUTION) IMPLANT

## 2015-01-07 NOTE — Transfer of Care (Signed)
Immediate Anesthesia Transfer of Care Note  Patient: Natasha Warren  Procedure(s) Performed: Procedure(s): NEPHROLITHOTOMY PERCUTANEOUS (Left) HOLMIUM LASER APPLICATION (Left)  Patient Location: PACU  Anesthesia Type:General  Level of Consciousness: awake, alert  and oriented  Airway & Oxygen Therapy: Patient Spontanous Breathing and Patient connected to face mask oxygen  Post-op Assessment: Report given to RN and Post -op Vital signs reviewed and stable  Post vital signs: Reviewed and stable  Last Vitals:  Filed Vitals:   01/07/15 1223  BP:   Pulse: 61  Temp:   Resp:     Complications: No apparent anesthesia complications

## 2015-01-07 NOTE — H&P (Signed)
H&P  Chief Complaint: Kidney stone  History of Present Illness: Natasha Warren is a 69 y.o. year old female who presents this time for left percutaneous nephrolithotomy. She has a history of calculous disease, with the original etiologic agent being hyperparathyroidism. 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 Constance Holster bower.   She recently presented with flank pain and UTI-the patient was noted have an infected, obstructing left proximal ureteral stone. She had a stent placed. The infection has been adequately treated and she is currently on antibiotic suppression. She presents at this time for percutaneous nephrolithotomy.  Past Medical History  Diagnosis Date  . Hypertension   . Thyroid disorder   . Irritable bowel syndrome   . Seasonal allergies   . Hypothyroidism   . Colon polyps   . Arthritis   . Hyperlipidemia   . Urolithiasis   . History of gout   . Nocturia   . Hyperparathyroidism, primary   . Renal disorder     kidney stones    Past Surgical History  Procedure Laterality Date  . Bladder tack  09/1984  . Lithotripsy  06/1995  . 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 Rotor cuff  2011    Right Shoulder  . Knee arthroscopy  12/2010    Left knee  . Colonoscopy  06/2006    This was her second one  . Appendectomy  1954  . Cholecystectomy  01/1999  . Colonoscopy  07/12/2011    Procedure: COLONOSCOPY;  Surgeon: Rogene Houston, MD;  Location: [facility];  Service: Endoscopy;  Laterality: N/A;  10:30 am  . Thyroidectomy N/A 07/31/2013    Procedure: PARATHYROIDECTOMY;  Surgeon: Odis Hollingshead, MD;  Location: [facility] ORS;  Service: General;  Laterality: N/A;  . Thyroid exploration N/A 07/31/2013    Procedure: neck EXPLORATION;  Surgeon: Odis Hollingshead, MD;  Location: [facility] ORS;  Service: General;  Laterality: N/A;  . Cystoscopy w/ ureteral stent placement Left 10/03/2014    Procedure:  CYSTOSCOPY WITH URETERAL STENT PLACEMENT;  Surgeon: Malka So, MD;  Location: [facility] ORS;  Service: Urology;  Laterality: Left;  . Abdominal hysterectomy  1986    Home Medications:  Medications Prior to Admission  Medication Sig Dispense Refill  . acetaminophen (TYLENOL) 500 MG tablet Take 1,000 mg by mouth every 6 (six) hours as needed for pain.    . Cholecalciferol (VITAMIN D) 2000 UNITS CAPS Take 1 capsule by mouth every morning.    Marland Kitchen levothyroxine (SYNTHROID, LEVOTHROID) 50 MCG tablet Take 50 mcg by mouth daily before breakfast.     . losartan-hydrochlorothiazide (HYZAAR) 100-25 MG per tablet Take 1 tablet by mouth every morning.     . metoprolol succinate (TOPROL-XL) 50 MG 24 hr tablet Take 50 mg by mouth every morning. Take with or immediately following a meal.    . oxybutynin (DITROPAN) 5 MG tablet Take 5 mg by mouth every 8 (eight) hours.    Marland Kitchen oxyCODONE-acetaminophen (PERCOCET) 5-325 MG per tablet Take 1-2 tablets by mouth every 4 (four) hours as needed. 20 tablet 0  . trimethoprim (TRIMPEX) 100 MG tablet Take 100 mg by mouth at bedtime.    Marland Kitchen LORazepam (ATIVAN) 0.5 MG tablet Take 0.5 mg by mouth at bedtime.    . ondansetron (ZOFRAN) 4 MG tablet Take 1 tablet (4 mg total) by mouth every 8 (eight) hours  as needed for nausea or vomiting. (Patient not taking: Reported on 12/28/2014) 10 tablet 0  . tamsulosin (FLOMAX) 0.4 MG CAPS capsule Take 1 capsule (0.4 mg total) by mouth 2 (two) times daily. (Patient not taking: Reported on 12/28/2014) 10 capsule 0    Allergies:  Allergies  Allergen Reactions  . Ace Inhibitors     Patient doesn't remember this allergy.  On 12/30/2014 received LOV visit from PCP , Dr Rory Percy in [place], [place].  - No known active drug allergies listed in his office visit note dated 05/21/2014.      Family History  Problem Relation Age of Onset  . Hypertension Mother   . Arthritis Mother   . Hypertension Father   . Healthy Brother   . Healthy Daughter   . Healthy  Daughter   . Colon cancer Neg Hx     Social History:  reports that she has never smoked. She has never used smokeless tobacco. She reports that she drinks alcohol. She reports that she does not use illicit drugs.  ROS: She is having urinary frequency and urgency. A complete review of systems was performed.  All systems are negative except for pertinent findings as noted.  Physical Exam:  Vital signs in last 24 hours: Temp:  [98.5 F (36.9 C)] 98.5 F (36.9 C) (04/21 0743) Pulse Rate:  [72] 72 (04/21 0743) Resp:  [18] 18 (04/21 0743) BP: (120)/(77) 120/77 mmHg (04/21 0743) SpO2:  [99 %] 99 % (04/21 0743) Weight:  [220 lb (99.791 kg)] 220 lb (99.791 kg) (04/21 0743) General:  Alert and oriented, No acute distress HEENT: Normocephalic, atraumatic Neck: No JVD or lymphadenopathy Cardiovascular: Regular rate and rhythm Lungs: Clear bilaterally Abdomen: Soft, nontender, nondistended, no abdominal masses Back: No CVA tenderness Extremities: No edema Neurologic: Grossly intact  Laboratory Data:  Results for orders placed or performed during the hospital encounter of 01/07/15 (from the past 24 hour(s))  APTT     Status: None   Collection Time: 01/07/15  8:15 AM  Result Value Ref Range   aPTT 29 24 - 37 seconds  Basic metabolic panel     Status: Abnormal   Collection Time: 01/07/15  8:15 AM  Result Value Ref Range   Sodium 136 135 - 145 mmol/L   Potassium 3.5 3.5 - 5.1 mmol/L   Chloride 108 96 - 112 mmol/L   CO2 24 19 - 32 mmol/L   Glucose, Bld 115 (H) 70 - 99 mg/dL   BUN 27 (H) 6 - 23 mg/dL   Creatinine, Ser 1.34 (H) 0.50 - 1.10 mg/dL   Calcium 8.9 8.4 - 10.5 mg/dL   GFR calc non Af Amer 40 (L) >90 mL/min   GFR calc Af Amer 46 (L) >90 mL/min   Anion gap 4 (L) 5 - 15  CBC     Status: None   Collection Time: 01/07/15  8:15 AM  Result Value Ref Range   WBC 7.1 4.0 - 10.5 K/uL   RBC 4.52 3.87 - 5.11 MIL/uL   Hemoglobin 12.8 12.0 - 15.0 g/dL   HCT 40.5 36.0 - 46.0 %   MCV  89.6 78.0 - 100.0 fL   MCH 28.3 26.0 - 34.0 pg   MCHC 31.6 30.0 - 36.0 g/dL   RDW 14.8 11.5 - 15.5 %   Platelets 288 150 - 400 K/uL  Protime-INR     Status: None   Collection Time: 01/07/15  8:15 AM  Result Value Ref Range   Prothrombin Time  13.8 11.6 - 15.2 seconds   INR 1.05 0.00 - 1.49   No results found for this or any previous visit (from the past 240 hour(s)). Creatinine:  Recent Labs  01/07/15 0815  CREATININE 1.34*    Radiologic Imaging: No results found.  Impression/Assessment:  Large stone burden in left kidney  Plan:  Left PCNL  Jorja Loa 01/07/2015, 9:20 AM  [name]. Adan Beal MD

## 2015-01-07 NOTE — Anesthesia Preprocedure Evaluation (Signed)
Anesthesia Evaluation  Patient identified by MRN, date of birth, ID band Patient awake    Reviewed: Allergy & Precautions, NPO status , Patient's Chart, lab work & pertinent test results  History of Anesthesia Complications Negative for: history of anesthetic complications  Airway Mallampati: II  TM Distance: >3 FB Neck ROM: Full    Dental no notable dental hx. (+) Dental Advisory Given   Pulmonary neg pulmonary ROS,  breath sounds clear to auscultation  Pulmonary exam normal       Cardiovascular hypertension, Pt. on medications Rhythm:Regular Rate:Normal     Neuro/Psych negative neurological ROS  negative psych ROS   GI/Hepatic negative GI ROS, Neg liver ROS,   Endo/Other  Hypothyroidism obesity  Renal/GU Renal disease  negative genitourinary   Musculoskeletal  (+) Arthritis -, Osteoarthritis,    Abdominal   Peds negative pediatric ROS (+)  Hematology negative hematology ROS (+)   Anesthesia Other Findings   Reproductive/Obstetrics negative OB ROS                             Anesthesia Physical Anesthesia Plan  ASA: III  Anesthesia Plan: General   Post-op Pain Management:    Induction: Intravenous  Airway Management Planned: Oral ETT  Additional Equipment:   Intra-op Plan:   Post-operative Plan:   Informed Consent: I have reviewed the patients History and Physical, chart, labs and discussed the procedure including the risks, benefits and alternatives for the proposed anesthesia with the patient or authorized representative who has indicated his/her understanding and acceptance.   Dental advisory given  Plan Discussed with:   Anesthesia Plan Comments:         Anesthesia Quick Evaluation

## 2015-01-07 NOTE — Anesthesia Postprocedure Evaluation (Signed)
  Anesthesia Post-op Note  Patient: Natasha Warren  Procedure(s) Performed: Procedure(s) (LRB): NEPHROLITHOTOMY PERCUTANEOUS (Left) HOLMIUM LASER APPLICATION (Left)  Patient Location: PACU  Anesthesia Type: General  Level of Consciousness: awake and alert   Airway and Oxygen Therapy: Patient Spontanous Breathing  Post-op Pain: mild  Post-op Assessment: Post-op Vital signs reviewed, Patient's Cardiovascular Status Stable, Respiratory Function Stable, Patent Airway and No signs of Nausea or vomiting  Last Vitals:  Filed Vitals:   01/07/15 1737  BP: 109/67  Pulse: 70  Temp: 37 C  Resp: 11    Post-op Vital Signs: stable   Complications: No apparent anesthesia complications

## 2015-01-07 NOTE — H&P (Signed)
Chief Complaint: Kidney stones  Referring Physician(s): Dahlstedt,S  History of Present Illness: Natasha Warren is a 69 y.o. female with history of nephrolithiasis, and recent infected obstructing stone/stenting in the left kidney in January. Recent KUB reveals multiple stones in the left kidney, the largest burden is in the mid and lower pole. She presents today for left nephrostomy/nephroureteral catheter placement prior to planned nephrolithotomy.  Past Medical History  Diagnosis Date  . Hypertension   . Thyroid disorder   . Irritable bowel syndrome   . Seasonal allergies   . Hypothyroidism   . Colon polyps   . Arthritis   . Hyperlipidemia   . Urolithiasis   . History of gout   . Nocturia   . Hyperparathyroidism, primary   . Renal disorder     kidney stones    Past Surgical History  Procedure Laterality Date  . Bladder tack  09/1984  . Lithotripsy  06/1995  . 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 Rotor cuff  2011    Right Shoulder  . Knee arthroscopy  12/2010    Left knee  . Colonoscopy  06/2006    This was her second one  . Appendectomy  1954  . Cholecystectomy  01/1999  . Colonoscopy  07/12/2011    Procedure: COLONOSCOPY;  Surgeon: Rogene Houston, MD;  Location: [facility];  Service: Endoscopy;  Laterality: N/A;  10:30 am  . Thyroidectomy N/A 07/31/2013    Procedure: PARATHYROIDECTOMY;  Surgeon: Odis Hollingshead, MD;  Location: [facility];  Service: General;  Laterality: N/A;  . Thyroid exploration N/A 07/31/2013    Procedure: neck EXPLORATION;  Surgeon: Odis Hollingshead, MD;  Location: [facility];  Service: General;  Laterality: N/A;  . Cystoscopy w/ ureteral stent placement Left 10/03/2014    Procedure: CYSTOSCOPY WITH URETERAL STENT PLACEMENT;  Surgeon: Malka So, MD;  Location: [facility];  Service: Urology;  Laterality: Left;  . Abdominal hysterectomy  1986    Allergies: Ace  inhibitors  Medications: Prior to Admission medications   Medication Sig Start Date End Date Taking? Authorizing Provider  acetaminophen (TYLENOL) 500 MG tablet Take 1,000 mg by mouth every 6 (six) hours as needed for pain.   Yes Historical Provider, MD  Cholecalciferol (VITAMIN D) 2000 UNITS CAPS Take 1 capsule by mouth every morning.   Yes Historical Provider, MD  levothyroxine (SYNTHROID, LEVOTHROID) 50 MCG tablet Take 50 mcg by mouth daily before breakfast.    Yes Historical Provider, MD  losartan-hydrochlorothiazide (HYZAAR) 100-25 MG per tablet Take 1 tablet by mouth every morning.    Yes Historical Provider, MD  metoprolol succinate (TOPROL-XL) 50 MG 24 hr tablet Take 50 mg by mouth every morning. Take with or immediately following a meal.   Yes Historical Provider, MD  oxybutynin (DITROPAN) 5 MG tablet Take 5 mg by mouth every 8 (eight) hours.   Yes Historical Provider, MD  oxyCODONE-acetaminophen (PERCOCET) 5-325 MG per tablet Take 1-2 tablets by mouth every 4 (four) hours as needed. 10/03/14  Yes Margarita Mail, PA-C  trimethoprim (TRIMPEX) 100 MG tablet Take 100 mg by mouth at bedtime.   Yes Historical Provider, MD  LORazepam (ATIVAN) 0.5 MG tablet Take 0.5 mg by mouth at bedtime.    Historical Provider, MD  ondansetron (ZOFRAN) 4 MG tablet Take 1 tablet (4 mg total) by mouth every 8 (eight) hours as needed  for nausea or vomiting. Patient not taking: Reported on 12/28/2014 10/03/14   Margarita Mail, PA-C  tamsulosin (FLOMAX) 0.4 MG CAPS capsule Take 1 capsule (0.4 mg total) by mouth 2 (two) times daily. Patient not taking: Reported on 12/28/2014 10/03/14   Margarita Mail, PA-C    Family History  Problem Relation Age of Onset  . Hypertension Mother   . Arthritis Mother   . Hypertension Father   . Healthy Brother   . Healthy Daughter   . Healthy Daughter   . Colon cancer Neg Hx     History   Social History  . Marital Status: Married    Spouse Name: N/A  . Number of Children:  N/A  . Years of Education: N/A   Social History Main Topics  . Smoking status: Never Smoker   . Smokeless tobacco: Never Used  . Alcohol Use: Yes     Comment: Very Rarely 1/2 glass of beer  . Drug Use: No  . Sexual Activity: Not Currently   Other Topics Concern  . None   Social History Narrative        Review of Systems  Constitutional: Negative for fever and chills.  Respiratory: Negative for cough and shortness of breath.   Cardiovascular: Negative for chest pain.  Gastrointestinal: Positive for nausea. Negative for vomiting, abdominal pain and blood in stool.  Genitourinary: Positive for dysuria and urgency. Negative for hematuria.  Musculoskeletal: Negative for back pain.  Neurological: Negative for headaches.  Psychiatric/Behavioral: The patient is nervous/anxious.     Vital Signs: BP 120/77 mmHg  Pulse 72  Temp(Src) 98.5 F (36.9 C) (Oral)  Resp 18  Ht 5\' 3"  (1.6 m)  Wt 220 lb (99.791 kg)  BMI 38.98 kg/m2  SpO2 99%  Physical Exam  Constitutional: She is oriented to person, place, and time. She appears well-developed and well-nourished.  Cardiovascular: Normal rate and regular rhythm.   Pulmonary/Chest: Effort normal and breath sounds normal.  Abdominal: Soft. Bowel sounds are normal. There is no tenderness.  obese  Musculoskeletal: Normal range of motion.  Neurological: She is alert and oriented to person, place, and time.    Imaging: No results found.  Labs:  CBC:  Recent Labs  10/03/14 1857 10/06/14 0835 12/30/14 1110 01/07/15 0815  WBC 12.8* 5.8 7.8 7.1  HGB 12.0 11.8* 13.9 12.8  HCT 36.8 37.7 43.3 40.5  PLT 305 336 275 288    COAGS: No results for input(s): INR, APTT in the last 8760 hours.  BMP:  Recent Labs  10/02/14 2156 10/03/14 1857 10/06/14 0835 12/30/14 1110  NA 135 131* 140 137  K 4.2 4.1 3.8 4.3  CL 102 102 104 104  CO2 25 25 27 26   GLUCOSE 125* 132* 147* 91  BUN 31* 30* 23 32*  CALCIUM 9.0 8.7 8.9 9.0   CREATININE 1.50* 1.86* 1.48* 1.26*  GFRNONAA 35* 27* 35* 43*  GFRAA 40* 31* 41* 50*    LIVER FUNCTION TESTS:  Recent Labs  10/02/14 2156 10/03/14 1857  BILITOT 0.7 1.0  AST 18 18  ALT 16 18  ALKPHOS 73 68  PROT 8.0 7.4  ALBUMIN 4.0 3.8    TUMOR MARKERS: No results for input(s): AFPTM, CEA, CA199, CHROMGRNA in the last 8760 hours.  Assessment and Plan: Natasha Warren is a 69 y.o. female with history of nephrolithiasis, and recent infected obstructing stone/stenting in the left kidney in January. Recent KUB reveals multiple stones in the left kidney, the largest burden is in  the mid and lower pole. She presents today for left nephrostomy/nephroureteral catheter placement prior to planned nephrolithotomy. Risks and benefits discussed with the patient/daughter including, but not limited to infection, bleeding, significant bleeding causing loss or decrease in renal function or damage to adjacent structures.  All of the patient's questions were answered, patient is agreeable to proceed. Consent signed and in chart.     Signed: Autumn Messing 01/07/2015, 8:30 AM   I spent a total of 20 minutes face to face in clinical consultation, greater than 50% of which was counseling/coordinating care for left percutaneous nephrostomy/nephroureteral cath placement

## 2015-01-07 NOTE — Discharge Instructions (Signed)
DISCHARGE INSTRUCTIONS FOR PERCUTANEOUS STONE SURGERY MEDICATIONS:  1. DO NOT RESUME YOUR IBUPROFEN, or any other medicines like aspirin, motrin, excedrin, advil, aleve, vitamin E, fish oil as these can all cause bleeding x 10 days.  2. Resume all your other meds from home - except do not take any other pain meds that you may have at home.  ACTIVITY 1. No strenuous activity, sexual activity, or lifting greater than 10 pounds for 2 weeks. 2. No driving while on narcotic pain medications 3. Drink plenty of water 4. Continue to walk at home - you can still get blood clots when you are at home, so keep active, but don't over do it. 5. May return to work in 1 week (but not heavy or strenuous activity).   BATHING You can shower.  Cover your wound with a dressing and remove the dressing immediately after the shower.  Do not submerge wound under water.   WOUND CARE Your wound will drain bloody fluid and may do so for 7-14 days. You have 2 options for dressings:  1. You may use gauze and tape to dress your wound.  If you choose this method, then change the dressing as it becomes soaked.  Change it at least once daily until it stops draining. You may switch to a Band Aid once drainage stops. 2. If drainage is copious, you may use an ostomy device.  This is a bag with an andhesive circle.  The circle has a hole in the middle of it and you cut the hole to the size needed to fit the wound.  This will collect the drainage in the bag and allow you to drain the bag as needed.   SIGNS/SYMPTOMS TO CALL: 1. Please call us if you have a fever greater than 101.5, uncontrolled nausea/vomiting, uncontrolled pain, dizziness, unable to urinate, bloody urine, chest pain, shortness of breath, leg swelling, leg pain, redness around wound, drainage from wound, or any other concerns or questions. 2. You can reach [place] at [phone]. FOLLOW-UP 1.  You will have a follow up appointment in

## 2015-01-07 NOTE — Procedures (Signed)
Perc L nephroureteral x2 under fluoro No complication No blood loss. See complete dictation in [facility].

## 2015-01-07 NOTE — Anesthesia Procedure Notes (Signed)
Procedure Name: Intubation Date/Time: 01/07/2015 12:36 PM Performed by: Noralyn Pick D Pre-anesthesia Checklist: Patient identified, Emergency Drugs available, Suction available and Patient being monitored Patient Re-evaluated:Patient Re-evaluated prior to inductionOxygen Delivery Method: Circle System Utilized Preoxygenation: Pre-oxygenation with 100% oxygen Intubation Type: IV induction Ventilation: Mask ventilation without difficulty Laryngoscope Size: Mac and 4 Grade View: Grade II Tube type: Oral Number of attempts: 1 Airway Equipment and Method: Stylet and Oral airway Placement Confirmation: ETT inserted through vocal cords under direct vision,  positive ETCO2 and breath sounds checked- equal and bilateral Secured at: 21 cm Tube secured with: Tape Dental Injury: Teeth and Oropharynx as per pre-operative assessment

## 2015-01-08 ENCOUNTER — Observation Stay (HOSPITAL_COMMUNITY): Payer: Medicare Other

## 2015-01-08 ENCOUNTER — Encounter (HOSPITAL_COMMUNITY): Payer: Self-pay | Admitting: Urology

## 2015-01-08 DIAGNOSIS — K589 Irritable bowel syndrome without diarrhea: Secondary | ICD-10-CM | POA: Diagnosis not present

## 2015-01-08 DIAGNOSIS — Z87440 Personal history of urinary (tract) infections: Secondary | ICD-10-CM | POA: Diagnosis not present

## 2015-01-08 DIAGNOSIS — N132 Hydronephrosis with renal and ureteral calculous obstruction: Secondary | ICD-10-CM | POA: Diagnosis not present

## 2015-01-08 DIAGNOSIS — Z9889 Other specified postprocedural states: Secondary | ICD-10-CM | POA: Diagnosis not present

## 2015-01-08 DIAGNOSIS — E785 Hyperlipidemia, unspecified: Secondary | ICD-10-CM | POA: Diagnosis not present

## 2015-01-08 DIAGNOSIS — N200 Calculus of kidney: Secondary | ICD-10-CM | POA: Diagnosis not present

## 2015-01-08 DIAGNOSIS — I10 Essential (primary) hypertension: Secondary | ICD-10-CM | POA: Diagnosis not present

## 2015-01-08 DIAGNOSIS — E039 Hypothyroidism, unspecified: Secondary | ICD-10-CM | POA: Diagnosis not present

## 2015-01-08 LAB — HEMOGLOBIN AND HEMATOCRIT, BLOOD
HCT: 35.9 % — ABNORMAL LOW (ref 36.0–46.0)
Hemoglobin: 11.6 g/dL — ABNORMAL LOW (ref 12.0–15.0)

## 2015-01-08 IMAGING — DX DG ABDOMEN 1V
2 series · 2 of 2 positions shown · non-contrast
Comparison: [DATE] and [DATE]

CLINICAL DATA: Large renal stones, surgery yesterday.

EXAM:
ABDOMEN - 1 VIEW

[abdomen kub (1 of 2)]
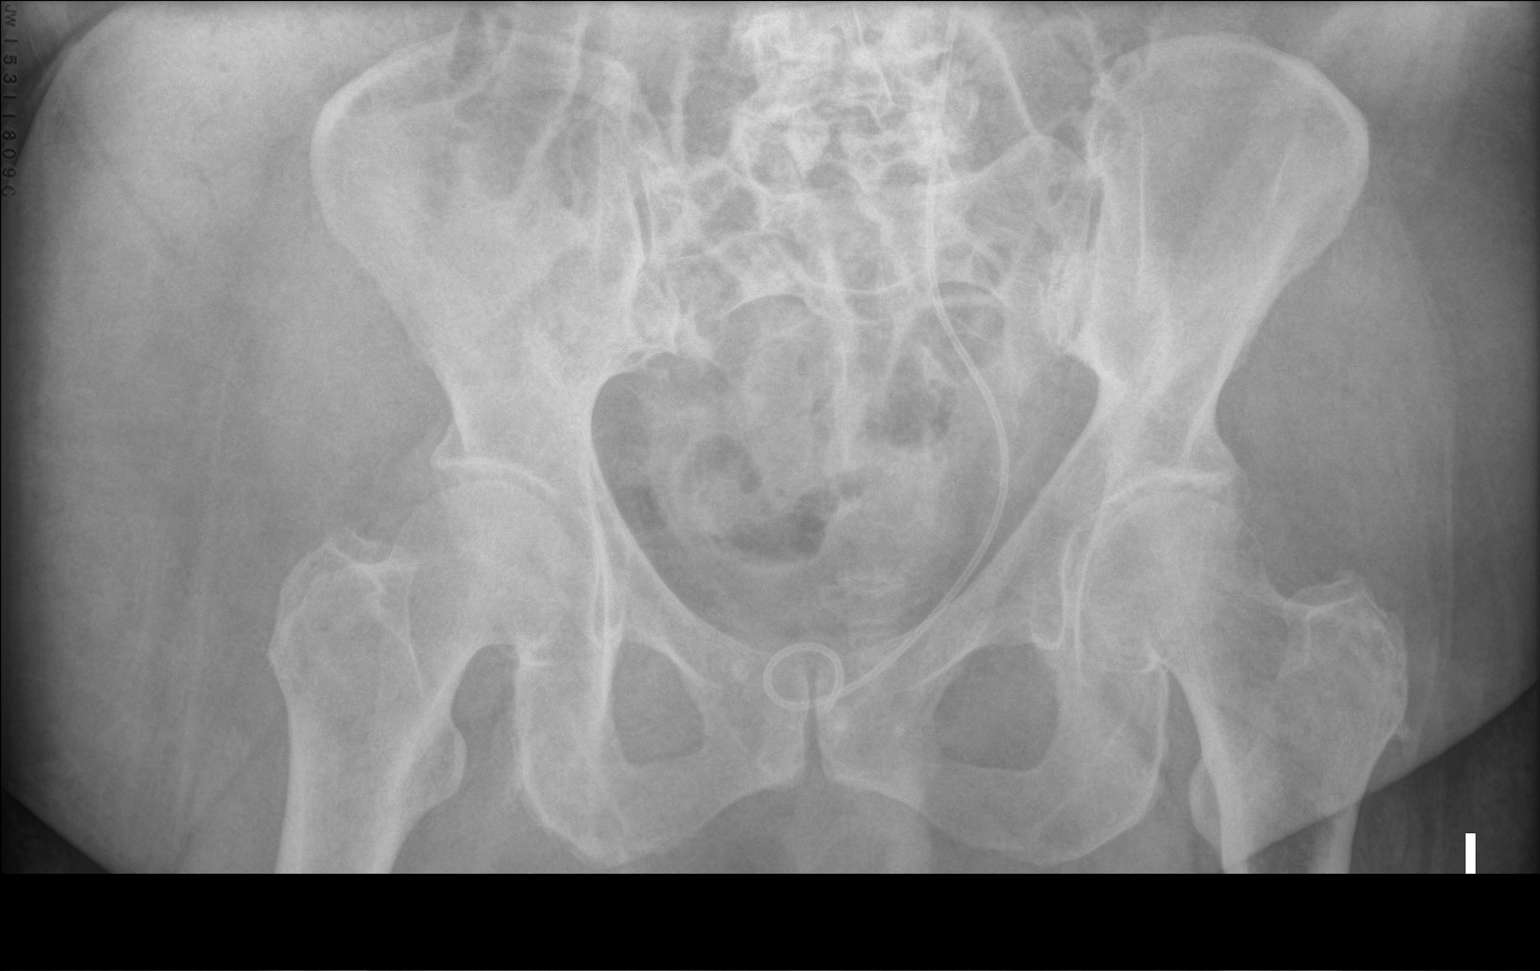

[abdomen kub (2 of 2)]
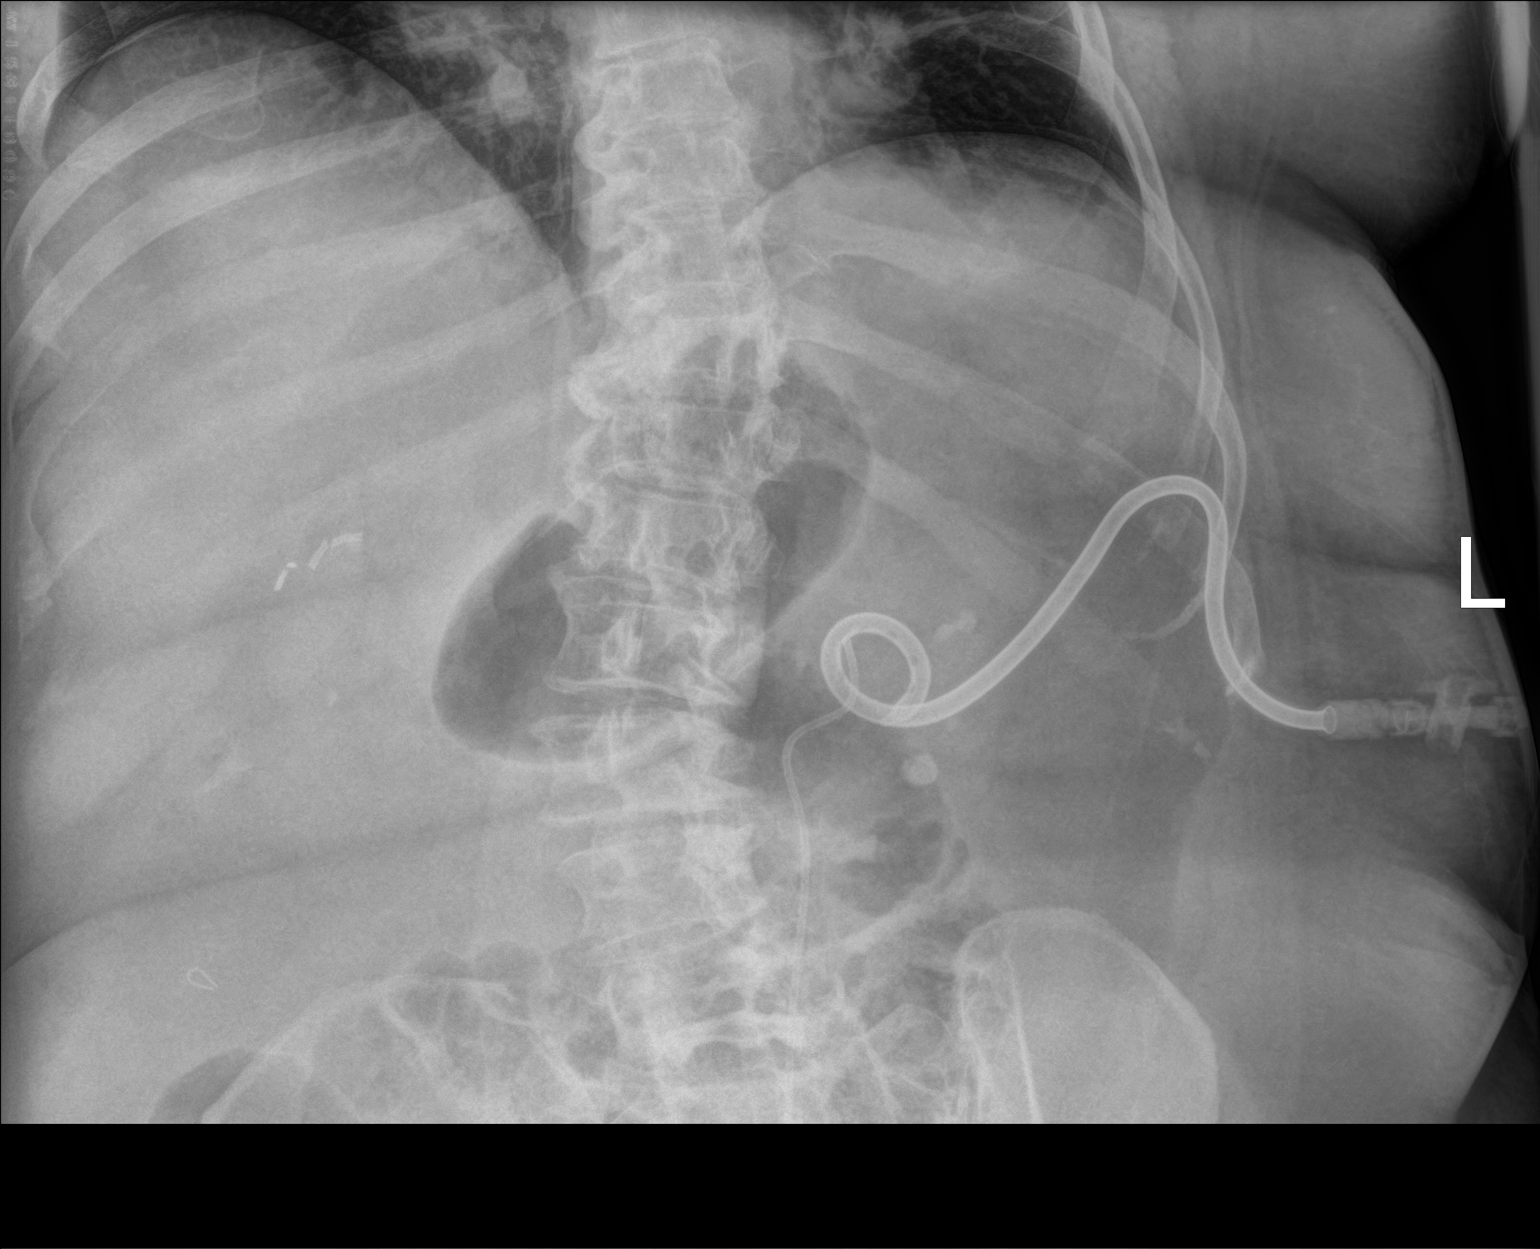

[2 of 2 positions shown; findings below may reference images not displayed]

FINDINGS: There is a left percutaneous nephrostomy catheter with pigtail over
the expected region of the left renal pelvis. There is a double-J
left-sided internal ureteral stent with uncal lying of the superior
pigtail and tip over the expected region of the left renal pelvis.
Inferior pigtail over the lower midline bladder. No change in a
cm stone over the mid pole of the left kidney and 1 cm stone over
the lower pole. Previous noted 1.5 cm stone over the mid to lower
pole left kidney no longer visualized. Single 4 mm stone over the
mid pole region of the right kidney.

Bowel gas pattern is nonobstructive. There is mild biphasic
curvature of the thoracolumbar spine with moderate spondylosis
throughout the spine.
IMPRESSION: Bilateral nephrolithiasis as described. 1.5 cm stone previously
noted over the mid to lower pole left kidney no longer seen.
Percutaneous left nephrostomy catheter and double-J internal left
ureteral stent as described.

## 2015-01-08 NOTE — Care Management Note (Signed)
    Page 1 of 1   01/08/2015     1:56:59 PM CARE MANAGEMENT NOTE 01/08/2015  Patient:  Natasha Warren, Natasha Warren   Account Number:  [id number]  Date Initiated:  01/08/2015  Documentation initiated by:  Dessa Phi  Subjective/Objective Assessment:   69 y/o f admitted w/hydronephrosis.     Action/Plan:   From home.   Anticipated DC Date:  01/09/2015   Anticipated DC Plan:  [place]  CM consult      Choice offered to / List presented to:             Status of service:  In process, will continue to follow Medicare Important Message given?   (If response is "NO", the following Medicare IM given date fields will be blank) Date Medicare IM given:   Medicare IM given by:   Date Additional Medicare IM given:   Additional Medicare IM given by:    Discharge Disposition:    Per UR Regulation:  Reviewed for med. necessity/level of care/duration of stay  If discussed at [place] of Stay Meetings, dates discussed:    Comments:  01/08/15 Dessa Phi RN BSN NCM [id number] POD#1 nephrostomy tube placement.No anticipated d/c needs.

## 2015-01-08 NOTE — Progress Notes (Signed)
Nephrostomy tube clamped as ordered. IV SLocked tol prec without issues. Explained to pt reason for clamping and the care of the tube. Old drainage noted around the tube will cont to monitor. SRP, RN

## 2015-01-08 NOTE — Progress Notes (Signed)
1 Day Post-Op Subjective: Patient reports less pain this am--slept well  Objective: Vital signs in last 24 hours: Temp:  [98.1 F (36.7 C)-99.5 F (37.5 C)] 99.5 F (37.5 C) (04/22 0410) Pulse Rate:  [52-85] 73 (04/22 0410) Resp:  [10-20] 14 (04/22 0410) BP: (101-181)/(63-106) 101/63 mmHg (04/22 0410) SpO2:  [90 %-100 %] 97 % (04/22 0410)  Intake/Output from previous day: 04/21 0701 - 04/22 0700 In: 4240 [P.O.:240; I.V.:4000] Out: 975 [Urine:975] Intake/Output this shift:    Physical Exam:  Constitutional: Vital signs reviewed. WD WN in NAD   Eyes: PERRL, No scleral icterus.   Pulmonary/Chest: Normal effort Abdominal: Soft. Non-tender, non-distended, bowel sounds are normal, no masses, organomegaly, or guarding present.    Urine clear  Lab Results:  Recent Labs  01/07/15 0815 01/08/15 0350  HGB 12.8 11.6*  HCT 40.5 35.9*   BMET  Recent Labs  01/07/15 0815  NA 136  K 3.5  CL 108  CO2 24  GLUCOSE 115*  BUN 27*  CREATININE 1.34*  CALCIUM 8.9    Recent Labs  01/07/15 0815  INR 1.05   No results for input(s): LABURIN in the last 72 hours. Results for orders placed or performed during the hospital encounter of 10/03/14  Culture, blood (routine x 2)     Status: None   Collection Time: 10/03/14  6:57 PM  Result Value Ref Range Status   Specimen Description BLOOD LEFT FOREARM  Final   Special Requests BOTTLES DRAWN AEROBIC AND ANAEROBIC 3CC  Final   Culture   Final    NO GROWTH 5 DAYS Note: Culture results may be compromised due to an inadequate volume of blood received in culture bottles. Performed at [facility]    Report Status 10/10/2014 FINAL  Final  Culture, blood (routine x 2)     Status: None   Collection Time: 10/03/14  6:57 PM  Result Value Ref Range Status   Specimen Description BLOOD LEFT HAND  Final   Special Requests BOTTLES DRAWN AEROBIC AND ANAEROBIC 3CC  Final   Culture   Final    NO GROWTH 5 DAYS Performed at [facility]    Report Status 10/10/2014 FINAL  Final  Urine culture     Status: None   Collection Time: 10/03/14  8:19 PM  Result Value Ref Range Status   Specimen Description URINE, RANDOM CYSTOSCOPE URINE  Final   Special Requests PATIENT ON FOLLOWING ZOSYN  Final   Colony Count   Final    45,000 COLONIES/ML Performed at [facility]    Culture   Final    PROTEUS MIRABILIS Performed at [facility]    Report Status 10/06/2014 FINAL  Final   Organism ID, Bacteria PROTEUS MIRABILIS  Final      Susceptibility   Proteus mirabilis - MIC*    AMPICILLIN <=2 SENSITIVE Sensitive     CEFAZOLIN <=4 SENSITIVE Sensitive     CEFTRIAXONE <=1 SENSITIVE Sensitive     CIPROFLOXACIN <=0.25 SENSITIVE Sensitive     GENTAMICIN >=16 RESISTANT Resistant     LEVOFLOXACIN <=0.12 SENSITIVE Sensitive     NITROFURANTOIN 128 RESISTANT Resistant     TOBRAMYCIN 8 INTERMEDIATE Intermediate     TRIMETH/SULFA <=20 SENSITIVE Sensitive     PIP/TAZO <=4 SENSITIVE Sensitive     * PROTEUS MIRABILIS    Studies/Results: Dg C-arm Gt 120 Min-no Report  01/07/2015   CLINICAL DATA: intra op   C-ARM GT 120 MINUTE  Fluoroscopy was  utilized by the requesting physician.  No radiographic  interpretation.    Ir Ureteral Stent Left New Access W/o Sep Nephrostomy Cath  01/07/2015   CLINICAL DATA:  Symptomatic left nephrolithiasis, planned percutaneous nephrolithotomy  EXAM: LEFT PERCUTANEOUS NEPHROURETERAL CATHETER PLACEMENT X2 UNDER FLUOROSCOPIC GUIDANCE  FLUOROSCOPY TIME:  19.5 minutes, 15 12 mGy  TECHNIQUE: The procedure, risks (including but not limited to bleeding, infection, organ damage ), benefits, and alternatives were explained to the patient. Questions regarding the procedure were encouraged and answered. The patient understands and consents to the procedure.  After laterality was marked, the left flank region prepped with Betadine, draped in usual sterile fashion, infiltrated locally with 1%  lidocaine.  Intravenous Fentanyl and Versed were administered as conscious sedation during continuous cardiorespiratory monitoring by the radiology RN, with a total moderate sedation time of 46 minutes.  Under real-time fluoroscopic guidance, a 21-gauge trocar needle was advanced into a lower pole calyx using the peripheral radiodense calculus as a guide. A 018 guidewire advanced centrally but remained partially coiled. Attention was then turned to the other dominant midpole calculus.  Under real-time fluoroscopic guidance, a 21-gauge Chiba needle was advanced into a posterior mid pole calyx using the peripheral radiodense calculus as a guide. A 018 guidewire advanced easily down the ureter. Needle was exchanged over a guidewire for transitional dilator. Contrast injection confirmed appropriate positioning, and demonstrates the narrowed infundibulum from the lower pole. Catheter was exchanged over a guidewire for a 5 [place] Kumpe catheter, advanced into the urinary bladder. Radiograph confirms appropriate nephroureteral catheter positioning.  The lower pole Needle was exchanged over a guidewire for transitional dilator. With the aid of an angled Glidewire, access to the central collecting system and ureter were achieved. A stiff wire and coaxial 9 French peel-away sheath assisted in advancing the 5 French catheter through the ureter to the urinary bladder. Radiograph confirms appropriate positioning of both nephroureteral catheters . Catheters capped and secured externally. The patient tolerated the procedure well.  COMPLICATIONS: COMPLICATIONS none  IMPRESSION: 1. Technically successful left percutaneous nephroureteral catheter placement x2, in preparation for percutaneous nephrolithotomy. I telephoned the results to Dr. Diona Fanti at the time of interpretation.   Electronically Signed   By: Lucrezia Europe M.D.   On: 01/07/2015 11:54    Assessment/Plan:   POD 1 left PCNL. Doing well. KUB this am. D/C foley. Due to  distance home, will watch today     Jorja Loa 01/08/2015, 7:50 AM

## 2015-01-08 NOTE — Progress Notes (Signed)
NT told this Rn that patient had suppository from home because she feels constipated. Patient educated that she should have told and asked .  its okey it will not hurt me its just an over the counter suppository". Noted that patient had her last BM yesterday prior to admission

## 2015-01-08 NOTE — Progress Notes (Signed)
UR completed 

## 2015-01-09 DIAGNOSIS — E039 Hypothyroidism, unspecified: Secondary | ICD-10-CM | POA: Diagnosis not present

## 2015-01-09 DIAGNOSIS — I10 Essential (primary) hypertension: Secondary | ICD-10-CM | POA: Diagnosis not present

## 2015-01-09 DIAGNOSIS — E785 Hyperlipidemia, unspecified: Secondary | ICD-10-CM | POA: Diagnosis not present

## 2015-01-09 DIAGNOSIS — Z87440 Personal history of urinary (tract) infections: Secondary | ICD-10-CM | POA: Diagnosis not present

## 2015-01-09 DIAGNOSIS — N132 Hydronephrosis with renal and ureteral calculous obstruction: Secondary | ICD-10-CM | POA: Diagnosis not present

## 2015-01-09 DIAGNOSIS — K589 Irritable bowel syndrome without diarrhea: Secondary | ICD-10-CM | POA: Diagnosis not present

## 2015-01-09 NOTE — Discharge Summary (Signed)
Patient ID: Natasha Warren MRN: [id number] DOB/AGE: [id number] y.o.  Admit date: 01/07/2015 Discharge date: 01/09/2015  Primary Care Physician:  Rory Percy, MD  Discharge Diagnoses:   Present on Admission:  . Hydronephrosis with renal and ureteral calculus obstruction  Consults:  None   Discharge Medications:   Medication List    STOP taking these medications        tamsulosin 0.4 MG Caps capsule  Commonly known as:  FLOMAX      TAKE these medications        acetaminophen 500 MG tablet  Commonly known as:  TYLENOL  Take 1,000 mg by mouth every 6 (six) hours as needed for pain.     levothyroxine 50 MCG tablet  Commonly known as:  SYNTHROID, LEVOTHROID  Take 50 mcg by mouth daily before breakfast.     LORazepam 0.5 MG tablet  Commonly known as:  ATIVAN  Take 0.5 mg by mouth at bedtime.     losartan-hydrochlorothiazide 100-25 MG per tablet  Commonly known as:  HYZAAR  Take 1 tablet by mouth every morning.     metoprolol succinate 50 MG 24 hr tablet  Commonly known as:  TOPROL-XL  Take 50 mg by mouth every morning. Take with or immediately following a meal.     ondansetron 4 MG tablet  Commonly known as:  ZOFRAN  Take 1 tablet (4 mg total) by mouth every 8 (eight) hours as needed for nausea or vomiting.     oxybutynin 5 MG tablet  Commonly known as:  DITROPAN  Take 5 mg by mouth every 8 (eight) hours.     oxyCODONE-acetaminophen 5-325 MG per tablet  Commonly known as:  PERCOCET  Take 1-2 tablets by mouth every 4 (four) hours as needed.     trimethoprim 100 MG tablet  Commonly known as:  TRIMPEX  Take 100 mg by mouth at bedtime.     Vitamin D 2000 UNITS Caps  Take 1 capsule by mouth every morning.         Significant Diagnostic Studies:  Abdomen 1 View (kub)  01/08/2015   CLINICAL DATA:  Large renal stones, surgery yesterday.  EXAM: ABDOMEN - 1 VIEW  COMPARISON:  01/07/2015 and 11/25/2014  FINDINGS: There is a left percutaneous nephrostomy  catheter with pigtail over the expected region of the left renal pelvis. There is a double-J left-sided internal ureteral stent with uncal lying of the superior pigtail and tip over the expected region of the left renal pelvis. Inferior pigtail over the lower midline bladder. No change in a 1.2 cm stone over the mid pole of the left kidney and 1 cm stone over the lower pole. Previous noted 1.5 cm stone over the mid to lower pole left kidney no longer visualized. Single 4 mm stone over the mid pole region of the right kidney.  Bowel gas pattern is nonobstructive. There is mild biphasic curvature of the thoracolumbar spine with moderate spondylosis throughout the spine.  IMPRESSION: Bilateral nephrolithiasis as described. 1.5 cm stone previously noted over the mid to lower pole left kidney no longer seen. Percutaneous left nephrostomy catheter and double-J internal left ureteral stent as described.   Electronically Signed   By: Marin Olp M.D.   On: 01/08/2015 08:12   Dg C-arm Gt 120 Min-no Report  01/07/2015   CLINICAL DATA: intra op   C-ARM GT 120 MINUTE  Fluoroscopy was utilized by the requesting physician.  No radiographic  interpretation.    Ir Ureteral  Stent Left New Access W/o Sep Nephrostomy Cath  01/07/2015   CLINICAL DATA:  Symptomatic left nephrolithiasis, planned percutaneous nephrolithotomy  EXAM: LEFT PERCUTANEOUS NEPHROURETERAL CATHETER PLACEMENT X2 UNDER FLUOROSCOPIC GUIDANCE  FLUOROSCOPY TIME:  19.5 minutes, 15 12 mGy  TECHNIQUE: The procedure, risks (including but not limited to bleeding, infection, organ damage ), benefits, and alternatives were explained to the patient. Questions regarding the procedure were encouraged and answered. The patient understands and consents to the procedure.  After laterality was marked, the left flank region prepped with Betadine, draped in usual sterile fashion, infiltrated locally with 1% lidocaine.  Intravenous Fentanyl and Versed were administered as  conscious sedation during continuous cardiorespiratory monitoring by the radiology RN, with a total moderate sedation time of 46 minutes.  Under real-time fluoroscopic guidance, a 21-gauge trocar needle was advanced into a lower pole calyx using the peripheral radiodense calculus as a guide. A 018 guidewire advanced centrally but remained partially coiled. Attention was then turned to the other dominant midpole calculus.  Under real-time fluoroscopic guidance, a 21-gauge Chiba needle was advanced into a posterior mid pole calyx using the peripheral radiodense calculus as a guide. A 018 guidewire advanced easily down the ureter. Needle was exchanged over a guidewire for transitional dilator. Contrast injection confirmed appropriate positioning, and demonstrates the narrowed infundibulum from the lower pole. Catheter was exchanged over a guidewire for a 5 [place] Kumpe catheter, advanced into the urinary bladder. Radiograph confirms appropriate nephroureteral catheter positioning.  The lower pole Needle was exchanged over a guidewire for transitional dilator. With the aid of an angled Glidewire, access to the central collecting system and ureter were achieved. A stiff wire and coaxial 9 French peel-away sheath assisted in advancing the 5 French catheter through the ureter to the urinary bladder. Radiograph confirms appropriate positioning of both nephroureteral catheters . Catheters capped and secured externally. The patient tolerated the procedure well.  COMPLICATIONS: COMPLICATIONS none  IMPRESSION: 1. Technically successful left percutaneous nephroureteral catheter placement x2, in preparation for percutaneous nephrolithotomy. I telephoned the results to Dr. Diona Fanti at the time of interpretation.   Electronically Signed   By: Lucrezia Europe M.D.   On: 01/07/2015 11:54    Brief H and P: For complete details please refer to admission H and P, but in brief the pt was admitted for percutaneous stone  management  Hospital Course:  Active Problems:   Hydronephrosis with renal and ureteral calculus obstruction Postop course was unremarkable, pt went home on POD 2  Day of Discharge BP 96/52 mmHg  Pulse 65  Temp(Src) 98.2 F (36.8 C) (Oral)  Resp 18  Ht 5\' 3"  (1.6 m)  Wt 220 lb (99.791 kg)  BMI 38.98 kg/m2  SpO2 99%  No results found for this or any previous visit (from the past 24 hour(s)).  Physical Exam: General: Alert and awake oriented x3 not in any acute distress. HEENT: anicteric sclera, pupils reactive to light and accommodation CVS: S1-S2 clear no murmur rubs or gallops Chest: clear to auscultation bilaterally, no wheezing rales or rhonchi Abdomen: soft nontender, nondistended, normal bowel sounds, no organomegaly Extremities: no cyanosis, clubbing or edema noted bilaterally Neuro: Cranial nerves II-XII intact, no focal neurological deficits  Disposition:  Home  Diet:  No erestrictions  Activity:  Discussed w/ pt   Disposition and Follow-up:     Discharge Instructions    Discharge patient    Complete by:  As directed  TESTS THAT NEED FOLLOW-UP  N/A  DISCHARGE FOLLOW-UP   Time spent on Discharge:  15 mins  Signed: Jorja Loa 01/09/2015, 8:28 AM

## 2015-01-09 NOTE — Progress Notes (Signed)
Pt discharged to home . DC instructions given. Teach back utilized. No concerns voiced. Left unit in wheelchair pushed by nurse tech. Left in good condition. Vwilliams,rn.

## 2015-01-09 NOTE — Progress Notes (Signed)
Utilization Review completed.  

## 2015-01-10 NOTE — Op Note (Signed)
Preoperative diagnosis: Multiple left renal calculi, aggregate diameter greater than 3 cm  Postoperative diagnosis: Same  Principal procedure: Left percutaneous nephrolithotomy of renal calculi with aggregate diameter greater than 3 cm, antegrade nephrostogram with interpretive fluoroscopy, placement of 24 cm x 6 French contour stent through percutaneous route  Surgeon: Enoc Getter  Anesthesia: Gen. endotracheal  Complications: None  Tubes: 16 French Cook pigtail catheter for nephrostomy tube, 16 French Foley catheter, 24 cm x 6 French contour double-J stent without string  Specimens: Stone fragments, to the patient's family  Indications: 69 year old female with recent presentation of obstructing left UPJ stone as well as urinary tract infection. She underwent urgent stenting, with antibiotic management. Because of her large stone burden in the left kidney (2 stones measuring at least 15 mm, multiple other stones averaging 6-7 mm), she presents for percutaneous management of the stones. The procedure, as well as risks and complications were discussed with her. She understands these and desires to proceed.  Findings: Patient had a dominant left interpolar stone measuring 15 mm in size. She had another stone in a lower pole calyx with very difficult access due to infundibular narrowing. 2-3 renal pelvic stones were noted as well as several upper calyceal stones, at least one of which was difficult to access due to the position of the interpolar access site as well as lower pole infundibular narrowing.  Procedure:  Patient was identified in the holding area and brought to the operating room following adequate marking an antibiotic administration. She received general endotracheal anesthetic in the supine position. Foley catheter was placed. She was then placed in the prone position on the operative table. All extremities and pressure points were padded appropriately. Her left flank was exposed,  prepped and draped. Dr. Jarvis Newcomer, the interventional radiologist, placed 2 access sites with Kumpe catheters. One of these was through a lower pole calyx adjacent to the lower pole stone, the other was in the interpolar calyx. Tubes were also prepped and draped into the field.  Using fluoroscopic guidance, I first access the lower pole catheter. Guidewire was placed through the Kumpe catheter, using fluoroscopic guidance, this was negotiated into the bladder. I then removed the Kumpe catheter, and over top of the guidewire, placed a 10 French peel-away sheath. The skin was incised around the guidewire prior to this for a distance of 12 mm. I then removed the inner core the access sheath, and placed another guidewire fluoroscopically and of the bladder. The access sheath was then removed. The tract into the lower pole calyx was then dilated with a NephroMax balloon at a pressure of 18 atm for 2 minutes. I then negotiated and access sheath, 28 [place], over top of the inflated balloon. The balloon was then deflated and the balloon catheter removed. The scope was advanced through the sheath into the lower pole calyx. It was evident that the access calyx was not the calyx of the stone, but an adjacent calyx. I was unable to identify the stone through this calyx. I was able to eventually negotiated of cystoscope through the narrowed infundibulum and into the renal pelvis. Several small stones were grasped with the Nitinol basket and extracted through the lower pole calyceal access point. At this point, after all upper pole calyces were inspected with the scope, no further stones could be identified. The Leksell cystoscope was removed. I then removed the access sheath from the lower pole access site. The interpolar access site was then dilated in a similar way to that of the  lower pole. Once the balloon dilator had been placed in the 28th French access sheath placed, the balloon dilator was removed. The rigid nephroscope  was then placed. I saw the interpolar calyceal stone right away. This was fragmented with the [employer] and all fragments removed. Rigid nephroscope was then advanced into the renal pelvis. A small fragment or 2 was grasped and then removed. I was unable to negotiate the rigid scope into the lower pole calyx where the other 15 mm stone was present. I then removed the rigid nephroscope and placed the Leksell cystoscope. I could get into the lower pole calyx adjacent to the access point to the lower pole. Because of the angle of the cystoscope, I could not adequately fragment the stone with the laser fiber. A few small lateral pieces of the stone could be fragmented, but even trying to snack the stone with the basket, I could not pull it out of the lower pole calyx due to calyceal infundibular narrowing. For perhaps 1 hour and 15 minutes I tried to fragment the stone with the laser or grasped the stone and extracted. Because of to Fencl, I could not do either the successfully. Because the stone was in a lower pole calyx with infundibular narrowing, I felt that further attempts were unnecessary, as in the future although the stone might grow, it would not be able to be passed a fall out spontaneously. Following inspection of all other calyces and the renal pelvis as well as the UPJ with the flexible cystoscope, and finding no more stones, I at this point terminated the procedure. The flexible cystoscope was removed. The guidewire was taken out of the lower pole calyx access point. Over top of one of the guidewire through the interpolar calyceal access, I then passed a 24 cm x 6 French contour stent using fluoroscopic guidance using the nephroscope. This was adequately positioned in the bladder and the renal pelvis following removal of the guidewire. Over top of the other guidewire, I then passed a Lipscomb pigtail catheter. Once this was negotiated into the renal pelvis, the tip was curled by drawing on  the string. This produced a good curl within the renal pelvis. I then removed the nephrostomy access sheath. I then performed an antegrade nephrostogram.  I used Omnipaque for this. This revealed no significant pelvic extravasation. Pelvis was decompressed. No filling defects are seen in the pelvis. Very little contrast did passed antegrade, however. The nephrostogram, however, showed/verified adequate positioning of the pigtail catheter. At this point, the pigtail catheter was hooked to external drainage. Skin incisions were closed with 2-0 silk placed in a vertical mattress fashion. I then secured the pigtail catheter with the 2-0 silk.  Dry sterile dressings were placed. They were secured with Tegaderm. Foley catheter and pigtail catheter and hooked to external drainage. Stone fragments were saved and eventually brought to the patient's family.  The patient tolerated procedure well. Blood loss was minimal. She was taken to PACU in stable condition.

## 2015-02-11 DIAGNOSIS — Z1231 Encounter for screening mammogram for malignant neoplasm of breast: Secondary | ICD-10-CM | POA: Diagnosis not present

## 2015-02-12 DIAGNOSIS — N200 Calculus of kidney: Secondary | ICD-10-CM | POA: Diagnosis not present

## 2015-02-16 ENCOUNTER — Other Ambulatory Visit: Payer: Self-pay | Admitting: Urology

## 2015-02-18 ENCOUNTER — Other Ambulatory Visit (HOSPITAL_COMMUNITY): Payer: Medicare Other

## 2015-02-27 DIAGNOSIS — J019 Acute sinusitis, unspecified: Secondary | ICD-10-CM | POA: Diagnosis not present

## 2015-02-27 DIAGNOSIS — J069 Acute upper respiratory infection, unspecified: Secondary | ICD-10-CM | POA: Diagnosis not present

## 2015-03-03 DIAGNOSIS — M47817 Spondylosis without myelopathy or radiculopathy, lumbosacral region: Secondary | ICD-10-CM | POA: Diagnosis not present

## 2015-03-03 DIAGNOSIS — M545 Low back pain: Secondary | ICD-10-CM | POA: Diagnosis not present

## 2015-03-03 DIAGNOSIS — M25551 Pain in right hip: Secondary | ICD-10-CM | POA: Diagnosis not present

## 2015-03-09 DIAGNOSIS — M47816 Spondylosis without myelopathy or radiculopathy, lumbar region: Secondary | ICD-10-CM | POA: Diagnosis not present

## 2015-03-09 DIAGNOSIS — M4306 Spondylolysis, lumbar region: Secondary | ICD-10-CM | POA: Diagnosis not present

## 2015-03-09 DIAGNOSIS — M5126 Other intervertebral disc displacement, lumbar region: Secondary | ICD-10-CM | POA: Diagnosis not present

## 2015-03-09 DIAGNOSIS — M5136 Other intervertebral disc degeneration, lumbar region: Secondary | ICD-10-CM | POA: Diagnosis not present

## 2015-03-09 NOTE — Patient Instructions (Signed)
    Natasha Warren  03/09/2015    Your procedure is scheduled on 03/16/15.  Report to [name] at 10:45 A.M.  Call this number if you have problems the morning of surgery:  [phone]   Remember:  Do not eat food or drink liquids after midnight.  Take these medicines the morning of surgery with A SIP OF WATER Levothyroxine, Losartan-HCTZ and Metoprolol. You may take your Hydrocodone if needed.   Do not wear jewelry, make-up or nail polish.  Do not wear lotions, powders, or perfumes.  You may wear deodorant.  Do not shave 48 hours prior to surgery.  Men may shave face and neck.  Do not bring valuables to the hospital.  [facility] is not responsible for any belongings or valuables.  Contacts, dentures or bridgework may not be worn into surgery.  Leave your suitcase in the car.  After surgery it may be brought to your room.  For patients admitted to the hospital, discharge time will be determined by your treatment team.  Patients discharged the day of surgery will not be allowed to drive home.   Special instructions:  Shower using Hibiclens (CHG bath) the night before surgery and the morning of surgery.  Please read over the following fact sheets that you were given. Anesthesia Post-op Instructions    PATIENT INSTRUCTIONS POST-ANESTHESIA  IMMEDIATELY FOLLOWING SURGERY:  Do not drive or operate machinery for the first twenty four hours after surgery.  Do not make any important decisions for twenty four hours after surgery or while taking narcotic pain medications or sedatives.  If you develop intractable nausea and vomiting or a severe headache please notify your doctor immediately.  FOLLOW-UP:  Please make an appointment with your surgeon as instructed. You do not need to follow up with anesthesia unless specifically instructed to do so.  WOUND CARE INSTRUCTIONS (if applicable):  Keep a dry clean dressing on the anesthesia/puncture wound site if there is drainage.  Once the  wound has quit draining you may leave it open to air.  Generally you should leave the bandage intact for twenty four hours unless there is drainage.  If the epidural site drains for more than 36-48 hours please call the anesthesia department.  QUESTIONS?:  Please feel free to call your physician or the hospital operator if you have any questions, and they will be happy to assist you.

## 2015-03-10 ENCOUNTER — Encounter (HOSPITAL_COMMUNITY)
Admission: RE | Admit: 2015-03-10 | Discharge: 2015-03-10 | Disposition: A | Discharge status: Home / Self Care | Payer: Medicare Other | Source: Ambulatory Visit | Attending: Urology | Admitting: Urology

## 2015-03-10 ENCOUNTER — Encounter (HOSPITAL_COMMUNITY): Payer: Self-pay

## 2015-03-10 DIAGNOSIS — M47817 Spondylosis without myelopathy or radiculopathy, lumbosacral region: Secondary | ICD-10-CM | POA: Diagnosis not present

## 2015-03-10 DIAGNOSIS — M545 Low back pain: Secondary | ICD-10-CM | POA: Diagnosis not present

## 2015-03-10 DIAGNOSIS — Z01818 Encounter for other preprocedural examination: Secondary | ICD-10-CM | POA: Insufficient documentation

## 2015-03-10 LAB — CBC
HCT: 44.2 % (ref 36.0–46.0)
Hemoglobin: 14.3 g/dL (ref 12.0–15.0)
MCH: 29.2 pg (ref 26.0–34.0)
MCHC: 32.4 g/dL (ref 30.0–36.0)
MCV: 90.2 fL (ref 78.0–100.0)
Platelets: 305 10*3/uL (ref 150–400)
RBC: 4.9 MIL/uL (ref 3.87–5.11)
RDW: 14.4 % (ref 11.5–15.5)
WBC: 10.1 10*3/uL (ref 4.0–10.5)

## 2015-03-10 LAB — BASIC METABOLIC PANEL
Anion gap: 10 (ref 5–15)
BUN: 49 mg/dL — AB (ref 6–20)
CO2: 23 mmol/L (ref 22–32)
Calcium: 9.5 mg/dL (ref 8.9–10.3)
Chloride: 104 mmol/L (ref 101–111)
Creatinine, Ser: 1.51 mg/dL — ABNORMAL HIGH (ref 0.44–1.00)
GFR calc Af Amer: 40 mL/min — ABNORMAL LOW (ref >60)
GFR calc non Af Amer: 34 mL/min — ABNORMAL LOW (ref >60)
Glucose, Bld: 102 mg/dL — ABNORMAL HIGH (ref 65–99)
POTASSIUM: 3.7 mmol/L (ref 3.5–5.1)
Sodium: 137 mmol/L (ref 135–145)

## 2015-03-10 NOTE — Pre-Procedure Instructions (Signed)
Patient given information to sign up for my chart at home. 

## 2015-03-16 ENCOUNTER — Encounter (HOSPITAL_COMMUNITY): Payer: Self-pay | Admitting: *Deleted

## 2015-03-16 ENCOUNTER — Ambulatory Visit (HOSPITAL_COMMUNITY): Payer: Medicare Other

## 2015-03-16 ENCOUNTER — Encounter (HOSPITAL_COMMUNITY)
Admission: RE | Disposition: A | Discharge status: Home / Self Care | Payer: Self-pay | Source: Ambulatory Visit | Attending: Urology

## 2015-03-16 ENCOUNTER — Ambulatory Visit (HOSPITAL_COMMUNITY): Payer: Medicare Other | Admitting: Anesthesiology

## 2015-03-16 ENCOUNTER — Ambulatory Visit (HOSPITAL_COMMUNITY)
Admission: RE | Admit: 2015-03-16 | Discharge: 2015-03-16 | Disposition: A | Discharge status: Home / Self Care | Payer: Medicare Other | Source: Ambulatory Visit | Attending: Urology | Admitting: Urology

## 2015-03-16 DIAGNOSIS — E785 Hyperlipidemia, unspecified: Secondary | ICD-10-CM | POA: Diagnosis not present

## 2015-03-16 DIAGNOSIS — E210 Primary hyperparathyroidism: Secondary | ICD-10-CM | POA: Insufficient documentation

## 2015-03-16 DIAGNOSIS — M109 Gout, unspecified: Secondary | ICD-10-CM | POA: Insufficient documentation

## 2015-03-16 DIAGNOSIS — Z86010 Personal history of colonic polyps: Secondary | ICD-10-CM | POA: Insufficient documentation

## 2015-03-16 DIAGNOSIS — E039 Hypothyroidism, unspecified: Secondary | ICD-10-CM | POA: Diagnosis not present

## 2015-03-16 DIAGNOSIS — N200 Calculus of kidney: Secondary | ICD-10-CM | POA: Diagnosis not present

## 2015-03-16 DIAGNOSIS — Z87442 Personal history of urinary calculi: Secondary | ICD-10-CM | POA: Insufficient documentation

## 2015-03-16 DIAGNOSIS — Z888 Allergy status to other drugs, medicaments and biological substances status: Secondary | ICD-10-CM | POA: Insufficient documentation

## 2015-03-16 DIAGNOSIS — N201 Calculus of ureter: Secondary | ICD-10-CM

## 2015-03-16 DIAGNOSIS — M1990 Unspecified osteoarthritis, unspecified site: Secondary | ICD-10-CM | POA: Diagnosis not present

## 2015-03-16 DIAGNOSIS — I10 Essential (primary) hypertension: Secondary | ICD-10-CM | POA: Diagnosis not present

## 2015-03-16 DIAGNOSIS — K589 Irritable bowel syndrome without diarrhea: Secondary | ICD-10-CM | POA: Diagnosis not present

## 2015-03-16 HISTORY — PX: STENT REMOVAL: SHX6421

## 2015-03-16 HISTORY — DX: Other intervertebral disc degeneration, lumbar region without mention of lumbar back pain or lower extremity pain: M51.369

## 2015-03-16 HISTORY — PX: CYSTOSCOPY WITH URETEROSCOPY AND STENT PLACEMENT: SHX6377

## 2015-03-16 HISTORY — PX: URETEROSCOPY WITH HOLMIUM LASER LITHOTRIPSY: SHX6645

## 2015-03-16 HISTORY — DX: Other intervertebral disc displacement, lumbar region: M51.26

## 2015-03-16 HISTORY — DX: Other intervertebral disc degeneration, lumbar region: M51.36

## 2015-03-16 SURGERY — URETEROSCOPY, WITH LITHOTRIPSY USING HOLMIUM LASER
Anesthesia: General | Laterality: Left

## 2015-03-16 MED ORDER — FENTANYL CITRATE (PF) 100 MCG/2ML IJ SOLN
INTRAMUSCULAR | Status: AC
  Filled 2015-03-16: qty 2

## 2015-03-16 MED ORDER — FENTANYL CITRATE (PF) 100 MCG/2ML IJ SOLN
INTRAMUSCULAR | Status: DC | PRN
  Administered 2015-03-16: 25 ug via INTRAVENOUS
  Administered 2015-03-16: 50 ug via INTRAVENOUS
  Administered 2015-03-16 (×5): 25 ug via INTRAVENOUS

## 2015-03-16 MED ORDER — STERILE WATER FOR IRRIGATION IR SOLN
Status: DC | PRN
  Administered 2015-03-16: 1000 mL

## 2015-03-16 MED ORDER — CEPHALEXIN 250 MG PO CAPS: 250.0000 mg | ORAL_CAPSULE | Freq: Two times a day (BID) | ORAL | Status: DC

## 2015-03-16 MED ORDER — MIDAZOLAM HCL 2 MG/2ML IJ SOLN
INTRAMUSCULAR | Status: AC
  Filled 2015-03-16: qty 2

## 2015-03-16 MED ORDER — EPHEDRINE SULFATE 50 MG/ML IJ SOLN
INTRAMUSCULAR | Status: AC
  Filled 2015-03-16: qty 1

## 2015-03-16 MED ORDER — FENTANYL CITRATE (PF) 100 MCG/2ML IJ SOLN
25.0000 ug | INTRAMUSCULAR | Status: AC
  Administered 2015-03-16 (×2): 25 ug via INTRAVENOUS
  Filled 2015-03-16: qty 2

## 2015-03-16 MED ORDER — SODIUM CHLORIDE 0.9 % IJ SOLN
INTRAMUSCULAR | Status: AC
  Filled 2015-03-16: qty 3

## 2015-03-16 MED ORDER — IOHEXOL 350 MG/ML SOLN
INTRAVENOUS | Status: DC | PRN
  Administered 2015-03-16: 50 mL via INTRAVENOUS

## 2015-03-16 MED ORDER — ONDANSETRON HCL 4 MG/2ML IJ SOLN
INTRAMUSCULAR | Status: AC
  Filled 2015-03-16: qty 2

## 2015-03-16 MED ORDER — PROPOFOL 10 MG/ML IV BOLUS
INTRAVENOUS | Status: AC
  Filled 2015-03-16: qty 20

## 2015-03-16 MED ORDER — MIDAZOLAM HCL 2 MG/2ML IJ SOLN
1.0000 mg | INTRAMUSCULAR | Status: DC | PRN
  Administered 2015-03-16 (×2): 2 mg via INTRAVENOUS
  Filled 2015-03-16: qty 2

## 2015-03-16 MED ORDER — FENTANYL CITRATE (PF) 100 MCG/2ML IJ SOLN
25.0000 ug | INTRAMUSCULAR | Status: DC | PRN
  Administered 2015-03-16 (×2): 25 ug via INTRAVENOUS
  Administered 2015-03-16 (×3): 50 ug via INTRAVENOUS
  Filled 2015-03-16: qty 2

## 2015-03-16 MED ORDER — LIDOCAINE HCL (CARDIAC) 10 MG/ML IV SOLN
INTRAVENOUS | Status: DC | PRN
  Administered 2015-03-16: 50 mg via INTRAVENOUS

## 2015-03-16 MED ORDER — EPHEDRINE SULFATE 50 MG/ML IJ SOLN
INTRAMUSCULAR | Status: DC | PRN
  Administered 2015-03-16 (×2): 10 mg via INTRAVENOUS

## 2015-03-16 MED ORDER — CEFAZOLIN SODIUM 1-5 GM-% IV SOLN
INTRAVENOUS | Status: AC
  Filled 2015-03-16: qty 50

## 2015-03-16 MED ORDER — CEFAZOLIN SODIUM 1-5 GM-% IV SOLN
1.0000 g | INTRAVENOUS | Status: AC
  Administered 2015-03-16: 1 g via INTRAVENOUS

## 2015-03-16 MED ORDER — PROPOFOL 10 MG/ML IV BOLUS
INTRAVENOUS | Status: DC | PRN
  Administered 2015-03-16: 150 mg via INTRAVENOUS

## 2015-03-16 MED ORDER — HYDROCODONE-ACETAMINOPHEN 5-325 MG PO TABS: 1.0000 | ORAL_TABLET | ORAL | Status: DC | PRN

## 2015-03-16 MED ORDER — SODIUM CHLORIDE 0.9 % IR SOLN
Status: DC | PRN
  Administered 2015-03-16 (×2): 3000 mL

## 2015-03-16 MED ORDER — LACTATED RINGERS IV SOLN
INTRAVENOUS | Status: DC
  Administered 2015-03-16: 14:00:00 via INTRAVENOUS
  Administered 2015-03-16: 1000 mL via INTRAVENOUS

## 2015-03-16 MED ORDER — ONDANSETRON HCL 4 MG/2ML IJ SOLN
INTRAMUSCULAR | Status: DC | PRN
  Administered 2015-03-16: 4 mg via INTRAVENOUS

## 2015-03-16 MED ORDER — ONDANSETRON HCL 4 MG/2ML IJ SOLN
4.0000 mg | Freq: Once | INTRAMUSCULAR | Status: AC
Start: 2015-03-16 — End: 2015-03-16
  Administered 2015-03-16: 4 mg via INTRAVENOUS
  Filled 2015-03-16: qty 2

## 2015-03-16 MED ORDER — ONDANSETRON HCL 4 MG/2ML IJ SOLN: 4.0000 mg | Freq: Once | INTRAMUSCULAR | Status: AC | PRN

## 2015-03-16 SURGICAL SUPPLY — 27 items
BAG DRAIN URO TABLE W/ADPT NS (DRAPE) ×2 IMPLANT
BAG DRN 8 ADPR NS SKTRN CSTL (DRAPE) ×1
BAG HAMPER (MISCELLANEOUS) ×2 IMPLANT
BASKET STONE 1.9X4X120X20 (MISCELLANEOUS) ×2 IMPLANT
CATH INTERMIT  6FR 70CM (CATHETERS) ×2 IMPLANT
CLOTH BEACON ORANGE TIMEOUT ST (SAFETY) ×2 IMPLANT
EXTRACTOR STONE NITINOL NGAGE (UROLOGICAL SUPPLIES) ×2 IMPLANT
GLOVE BIOGEL M 8.0 STRL (GLOVE) ×2 IMPLANT
GLOVE BIOGEL PI IND STRL 6.5 (GLOVE) ×1 IMPLANT
GLOVE BIOGEL PI IND STRL 7.0 (GLOVE) ×2 IMPLANT
GLOVE BIOGEL PI INDICATOR 6.5 (GLOVE) ×1
GLOVE BIOGEL PI INDICATOR 7.0 (GLOVE) ×2
GLOVE ECLIPSE 6.5 STRL STRAW (GLOVE) ×2 IMPLANT
GOWN PREVENTION PLUS LG XLONG (DISPOSABLE) ×2 IMPLANT
GOWN STRL REIN XL XLG (GOWN DISPOSABLE) ×2 IMPLANT
GUIDEWIRE STR DUAL SENSOR (WIRE) ×2 IMPLANT
IV NS IRRIG 3000ML ARTHROMATIC (IV SOLUTION) ×4 IMPLANT
LASER FIBER DISP (UROLOGICAL SUPPLIES) ×4 IMPLANT
MANIFOLD NEPTUNE II (INSTRUMENTS) ×2 IMPLANT
NS IRRIG 500ML POUR BTL (IV SOLUTION) IMPLANT
PACK CYSTO (CUSTOM PROCEDURE TRAY) ×2 IMPLANT
PAD ARMBOARD 7.5X6 YLW CONV (MISCELLANEOUS) ×2 IMPLANT
SHEATH ACCESS URETERAL 24CM (SHEATH) ×2 IMPLANT
SHEATH ACCESS URETERAL 38CM (SHEATH) ×2 IMPLANT
STENT URET 6FRX24 CONTOUR (STENTS) ×2 IMPLANT
TOWEL OR 17X26 4PK STRL BLUE (TOWEL DISPOSABLE) ×2 IMPLANT
WATER STERILE IRR 1000ML POUR (IV SOLUTION) ×2 IMPLANT

## 2015-03-16 NOTE — Transfer of Care (Signed)
Immediate Anesthesia Transfer of Care Note  Patient: Natasha Warren  Procedure(s) Performed: Procedure(s): LEFT URETEROSCOPIC LASER LITHOTRIPSY WITH STONE EXTRACTION (Left) LEFT URETERAL STENT REMOVAL (Left)  Patient Location: PACU  Anesthesia Type:MAC  Level of Consciousness: awake, oriented and patient cooperative  Airway & Oxygen Therapy:Spontaneous breathing; face mask Post-op Assessment: Report given to RN and Post -op Vital signs reviewed and stable  Post vital signs: Reviewed and stable  Last Vitals:  Filed Vitals:   03/16/15 1245  BP: 114/74  Resp: 32    Complications: No apparent anesthesia complications

## 2015-03-16 NOTE — Anesthesia Postprocedure Evaluation (Signed)
  Anesthesia Post-op Note  Patient: Natasha Warren  Procedure(s) Performed: Procedure(s): LEFT URETEROSCOPIC LASER LITHOTRIPSY WITH STONE EXTRACTION (Left) LEFT URETERAL STENT REMOVAL (Left) CYSTOSCOPY WITH LEFT STENT PLACEMENT (Left)  Patient Location: PACU  Anesthesia Type:MAC  Level of Consciousness: awake, alert , oriented and patient cooperative  Airway and Oxygen Therapy: Patient Spontanous Breathing  Post-op Pain: mild  Post-op Assessment: Post-op Vital signs reviewed, Patient's Cardiovascular Status Stable, Respiratory Function Stable, Patent Airway, No signs of Nausea or vomiting and Pain level controlled              Post-op Vital Signs: Reviewed and stable  Last Vitals:  Filed Vitals:   03/16/15 1558  BP:   Pulse: 68  Temp:   Resp: 9    Complications: No apparent anesthesia complications

## 2015-03-16 NOTE — Anesthesia Procedure Notes (Signed)
Procedure Name: LMA Insertion Date/Time: 03/16/2015 1:03 PM Performed by: Gershon Mussel, Mileydi Milsap Pre-anesthesia Checklist: Patient identified, Patient being monitored, Timeout performed, Emergency Drugs available and Suction available Patient Re-evaluated:Patient Re-evaluated prior to inductionOxygen Delivery Method: Circle System Utilized Preoxygenation: Pre-oxygenation with 100% oxygen Intubation Type: IV induction Ventilation: Mask ventilation without difficulty LMA Size: 4.0 Number of attempts: 1 Placement Confirmation: breath sounds checked- equal and bilateral and positive ETCO2 Tube secured with: Tape Dental Injury: Teeth and Oropharynx as per pre-operative assessment

## 2015-03-16 NOTE — H&P (Signed)
H&P  Chief Complaint: Left kidney stone  History of Present Illness: Natasha Warren is a 69 y.o. year old female who presents for followup URS of the left kidney to remove fragments following left PCNL in April, 2016.  Past Medical History  Diagnosis Date  . Hypertension   . Thyroid disorder   . Irritable bowel syndrome   . Seasonal allergies   . Hypothyroidism   . Colon polyps   . Arthritis   . Hyperlipidemia   . Urolithiasis   . History of gout   . Nocturia   . Hyperparathyroidism, primary   . Renal disorder     kidney stones  . History of gout     Past Surgical History  Procedure Laterality Date  . Bladder tack  09/1984  . Lithotripsy  06/1995  . 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 Rotor cuff  2011    Right Shoulder  . Knee arthroscopy  12/2010    Left knee  . Colonoscopy  06/2006    This was her second one  . Appendectomy  1954  . Cholecystectomy  01/1999  . Colonoscopy  07/12/2011    Procedure: COLONOSCOPY;  Surgeon: Rogene Houston, MD;  Location: [facility];  Service: Endoscopy;  Laterality: N/A;  10:30 am  . Thyroidectomy N/A 07/31/2013    Procedure: PARATHYROIDECTOMY;  Surgeon: Odis Hollingshead, MD;  Location: [facility];  Service: General;  Laterality: N/A;  . Thyroid exploration N/A 07/31/2013    Procedure: neck EXPLORATION;  Surgeon: Odis Hollingshead, MD;  Location: [facility];  Service: General;  Laterality: N/A;  . Cystoscopy w/ ureteral stent placement Left 10/03/2014    Procedure: CYSTOSCOPY WITH URETERAL STENT PLACEMENT;  Surgeon: Malka So, MD;  Location: [facility];  Service: Urology;  Laterality: Left;  . Abdominal hysterectomy  1986  . Nephrolithotomy Left 01/07/2015    Procedure: NEPHROLITHOTOMY PERCUTANEOUS;  Surgeon: Franchot Gallo, MD;  Location: [facility];  Service: Urology;  Laterality: Left;  With STENT  . Holmium laser application Left [id number]    Procedure:  HOLMIUM LASER APPLICATION;  Surgeon: Franchot Gallo, MD;  Location: [facility];  Service: Urology;  Laterality: Left;    Home Medications:  No prescriptions prior to admission    Allergies:  Allergies  Allergen Reactions  . Ace Inhibitors     Patient doesn't remember this allergy.  On 12/30/2014 received LOV visit from PCP , Dr Rory Percy in [place], [place].  - No known active drug allergies listed in his office visit note dated 05/21/2014.      Family History  Problem Relation Age of Onset  . Hypertension Mother   . Arthritis Mother   . Hypertension Father   . Healthy Brother   . Healthy Daughter   . Healthy Daughter   . Colon cancer Neg Hx     Social History:  reports that she has never smoked. She has never used smokeless tobacco. She reports that she drinks alcohol. She reports that she does not use illicit drugs.  ROS: A complete review of systems was performed.  All systems are negative except for pertinent findings as noted.  Physical Exam:  Vital signs in last 24 hours:   General:  Alert and oriented, No acute distress HEENT: Normocephalic, atraumatic Neck: No JVD or lymphadenopathy Cardiovascular: Regular rate and rhythm Lungs: Clear bilaterally Abdomen: Soft,  nontender, nondistended, no abdominal masses Back: No CVA tenderness Extremities: No edema Neurologic: Grossly intact  Laboratory Data:  No results found for this or any previous visit (from the past 24 hour(s)). No results found for this or any previous visit (from the past 240 hour(s)). Creatinine:  Recent Labs  03/10/15 1115  CREATININE 1.51*    Radiologic Imaging: No results found.  Impression/Assessment:  Left renal calculi s/p lef tPCNL  Plan:  Left ureteroscopy with holmium laser,extraction of remaining renal calculi  Jorja Loa 03/16/2015, 8:17 AM  [name]. Neilan Rizzo MD

## 2015-03-16 NOTE — Anesthesia Preprocedure Evaluation (Signed)
Anesthesia Evaluation  Patient identified by MRN, date of birth, ID band Patient awake    Reviewed: Allergy & Precautions, NPO status , Patient's Chart, lab work & pertinent test results, reviewed documented beta blocker date and time   History of Anesthesia Complications Negative for: history of anesthetic complications  Airway Mallampati: II  TM Distance: >3 FB Neck ROM: Full    Dental no notable dental hx. (+) Dental Advisory Given   Pulmonary neg pulmonary ROS,  breath sounds clear to auscultation  Pulmonary exam normal       Cardiovascular hypertension, Pt. on medications and Pt. on home beta blockers Normal cardiovascular examRhythm:Regular Rate:Normal     Neuro/Psych negative neurological ROS  negative psych ROS   GI/Hepatic negative GI ROS, Neg liver ROS,   Endo/Other  Hypothyroidism obesity  Renal/GU Renal disease  negative genitourinary   Musculoskeletal  (+) Arthritis - (lumbar HNP with back pain), Osteoarthritis,    Abdominal   Peds negative pediatric ROS (+)  Hematology negative hematology ROS (+)   Anesthesia Other Findings   Reproductive/Obstetrics negative OB ROS                             Anesthesia Physical Anesthesia Plan  ASA: III  Anesthesia Plan: General   Post-op Pain Management:    Induction: Intravenous  Airway Management Planned: LMA  Additional Equipment:   Intra-op Plan:   Post-operative Plan: Extubation in OR  Informed Consent: I have reviewed the patients History and Physical, chart, labs and discussed the procedure including the risks, benefits and alternatives for the proposed anesthesia with the patient or authorized representative who has indicated his/her understanding and acceptance.   Dental advisory given  Plan Discussed with:   Anesthesia Plan Comments:         Anesthesia Quick Evaluation

## 2015-03-16 NOTE — Discharge Instructions (Signed)

## 2015-03-18 ENCOUNTER — Encounter (HOSPITAL_COMMUNITY): Payer: Self-pay | Admitting: Urology

## 2015-03-18 NOTE — Op Note (Signed)
PATIENT:  Natasha Warren  PRE-OPERATIVE DIAGNOSIS: Left renal calculi  POST-OPERATIVE DIAGNOSIS: Same  PROCEDURE: Cysto, left J2 stent extraction, left ureteroscopy with laser/extraction of left renal calculi, left J2 stent extraction  SURGEON:  [name]. Basha Krygier, M.D.  ANESTHESIA:  General  EBL:  Minimal  DRAINS: 24 cm x 6 French contour double-J stent without string  LOCAL MEDICATIONS USED:  None  SPECIMEN:  Left renal stones, to the patient's family  INDICATION: Natasha Warren is a 69 year old female with recurrent left urolithiasis. She is status post left percutaneous nephrolithotomy within the past 2 months she hasn't indwelling stent. Because of her large stone burden in difficult access to her lower pole calyx as well as some other remaining fragments, she presents this time for ureteroscopic management of the above.  Description of procedure: The patient was properly identified and marked (if applicable) in the holding area. They were then  taken to the operating room and placed on the table in a supine position. General anesthesia was then administered. Once fully anesthetized the patient was moved to the dorsolithotomy position and the genitalia and perineum were sterilely prepped and draped in standard fashion. An official timeout was then performed. A 23 French cystoscope was advanced in her bladder which was inspected circumferentially. There were no tumors or trabeculations. Stent was seen extruding from her left ureteral orifice. It was grasped and brought through the urethral meatus. It was then cannulated with a sensor-tip guidewire which was advanced up into the left renal pelvis and upper pole calyceal system using fluoroscopic guidance. The stent was then removed over top of the guidewire. I then passed the inner core of a 12/24 [place] ureteral access sheath. I then passed the whole access sheath following initial dilatation with the inner core. The guidewire was  removed. I then passed a flexible ureteroscope up through the access sheath. Saline was used for irrigation.  The calyceal system was carefully inspected. There were stones in the upper pole calyces. These were dark brown. There were perhaps 5-6 mm in size. There was an interpole calyx with a larger stone that was tan in color. It seemed to take up the majority of the calyx. A larger stone was present in the lower pole calyx. This was also light tan in color. I used a 200  fiber to pass laser energy through and fragment the upper pole stones. Initially, the laser was set to 15 Hz and 0.5 J. The stones were easily fragmented and extracted using an engage basket. The interpolar calyceal stone was then fragmented. 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 However, after application of laser energy for significant length of time, the stone was fragmented into multiple smaller fragments which were extracted as well as possible with the engage basket. Some of the stones were difficult to grasp. Diligent attempt was made to remove as many fragments as possible from that one calyx. It was difficult to navigate the scope to the lower pole calyx where the larger stone was seen. However, once access was obtained, the laser was used to fragment the larger stone which was approximately 1314 mm in size, into smaller fragments. The lower pole calyx had the appearance of the calyceal diverticulum, and was quite capacious. Because of a relatively small infundibulum, it was hard to navigate the tip of the scope around the entire calyx. However, again using diligence, his many fragments as possible were extracted with the engage basket. I also attempted to use the Nitinol  basket to assist with navigation and snaring of all stone fragments. Following a lengthy procedure to decompress the lower pole calyceal stones, I then decided that maximum benefit was reached. At this point, the scope was removed. Through the  ureteral access catheter I passed the sensor-tip guidewire, and remove the access catheter. The guidewire was backloaded through the scope, and I then passed a 24 cm x 6 French contour double-J stent using cystoscopic and fluoroscopic guidance. Following removal of the wire, excellent proximal and distal curls were seen on the stent. The bladder was drained. The scope was removed. The patient was then awakened and taken to the PACU in stable condition. She tolerated the procedure well. Stone fragments were given to the patient's family.

## 2015-03-19 ENCOUNTER — Encounter (HOSPITAL_COMMUNITY): Payer: Self-pay | Admitting: Urology

## 2015-03-19 DIAGNOSIS — M5106 Intervertebral disc disorders with myelopathy, lumbar region: Secondary | ICD-10-CM | POA: Diagnosis not present

## 2015-03-23 DIAGNOSIS — Z87442 Personal history of urinary calculi: Secondary | ICD-10-CM | POA: Diagnosis not present

## 2015-03-23 DIAGNOSIS — M5126 Other intervertebral disc displacement, lumbar region: Secondary | ICD-10-CM | POA: Diagnosis not present

## 2015-03-23 DIAGNOSIS — M47816 Spondylosis without myelopathy or radiculopathy, lumbar region: Secondary | ICD-10-CM | POA: Diagnosis not present

## 2015-03-23 DIAGNOSIS — M9983 Other biomechanical lesions of lumbar region: Secondary | ICD-10-CM | POA: Diagnosis not present

## 2015-03-24 DIAGNOSIS — R42 Dizziness and giddiness: Secondary | ICD-10-CM | POA: Diagnosis not present

## 2015-04-13 ENCOUNTER — Ambulatory Visit (INDEPENDENT_AMBULATORY_CARE_PROVIDER_SITE_OTHER): Payer: Medicare Other | Admitting: Urology

## 2015-04-13 DIAGNOSIS — N200 Calculus of kidney: Secondary | ICD-10-CM

## 2015-04-13 DIAGNOSIS — N12 Tubulo-interstitial nephritis, not specified as acute or chronic: Secondary | ICD-10-CM | POA: Diagnosis not present

## 2015-04-20 DIAGNOSIS — K589 Irritable bowel syndrome without diarrhea: Secondary | ICD-10-CM | POA: Diagnosis not present

## 2015-04-20 DIAGNOSIS — Z87442 Personal history of urinary calculi: Secondary | ICD-10-CM | POA: Diagnosis not present

## 2015-04-20 DIAGNOSIS — I10 Essential (primary) hypertension: Secondary | ICD-10-CM | POA: Diagnosis not present

## 2015-04-20 DIAGNOSIS — Z79899 Other long term (current) drug therapy: Secondary | ICD-10-CM | POA: Diagnosis not present

## 2015-04-20 DIAGNOSIS — M5126 Other intervertebral disc displacement, lumbar region: Secondary | ICD-10-CM | POA: Diagnosis not present

## 2015-04-20 DIAGNOSIS — M5136 Other intervertebral disc degeneration, lumbar region: Secondary | ICD-10-CM | POA: Diagnosis not present

## 2015-04-20 DIAGNOSIS — G8929 Other chronic pain: Secondary | ICD-10-CM | POA: Diagnosis not present

## 2015-04-20 DIAGNOSIS — M47816 Spondylosis without myelopathy or radiculopathy, lumbar region: Secondary | ICD-10-CM | POA: Diagnosis not present

## 2015-04-20 DIAGNOSIS — M9983 Other biomechanical lesions of lumbar region: Secondary | ICD-10-CM | POA: Diagnosis not present

## 2015-04-20 DIAGNOSIS — E039 Hypothyroidism, unspecified: Secondary | ICD-10-CM | POA: Diagnosis not present

## 2015-04-20 DIAGNOSIS — Z823 Family history of stroke: Secondary | ICD-10-CM | POA: Diagnosis not present

## 2015-04-23 DIAGNOSIS — M5106 Intervertebral disc disorders with myelopathy, lumbar region: Secondary | ICD-10-CM | POA: Diagnosis not present

## 2015-05-04 DIAGNOSIS — N200 Calculus of kidney: Secondary | ICD-10-CM | POA: Diagnosis not present

## 2015-05-17 ENCOUNTER — Ambulatory Visit (HOSPITAL_COMMUNITY)
Admission: RE | Admit: 2015-05-17 | Discharge: 2015-05-17 | Disposition: A | Discharge status: Home / Self Care | Payer: Medicare Other | Source: Ambulatory Visit | Attending: Urology | Admitting: Urology

## 2015-05-17 ENCOUNTER — Other Ambulatory Visit: Payer: Self-pay | Admitting: Urology

## 2015-05-17 DIAGNOSIS — E780 Pure hypercholesterolemia: Secondary | ICD-10-CM | POA: Diagnosis not present

## 2015-05-17 DIAGNOSIS — E039 Hypothyroidism, unspecified: Secondary | ICD-10-CM | POA: Diagnosis not present

## 2015-05-17 DIAGNOSIS — I10 Essential (primary) hypertension: Secondary | ICD-10-CM | POA: Diagnosis not present

## 2015-05-17 DIAGNOSIS — N200 Calculus of kidney: Secondary | ICD-10-CM

## 2015-05-17 DIAGNOSIS — D519 Vitamin B12 deficiency anemia, unspecified: Secondary | ICD-10-CM | POA: Diagnosis not present

## 2015-05-17 DIAGNOSIS — M1000 Idiopathic gout, unspecified site: Secondary | ICD-10-CM | POA: Diagnosis not present

## 2015-05-17 IMAGING — DX DG ABDOMEN 1V
1 series · 1 of 1 positions shown · non-contrast
Comparison: [DATE]

CLINICAL DATA: History of renal calculi

EXAM:
ABDOMEN - 1 VIEW

[abdomen supine]
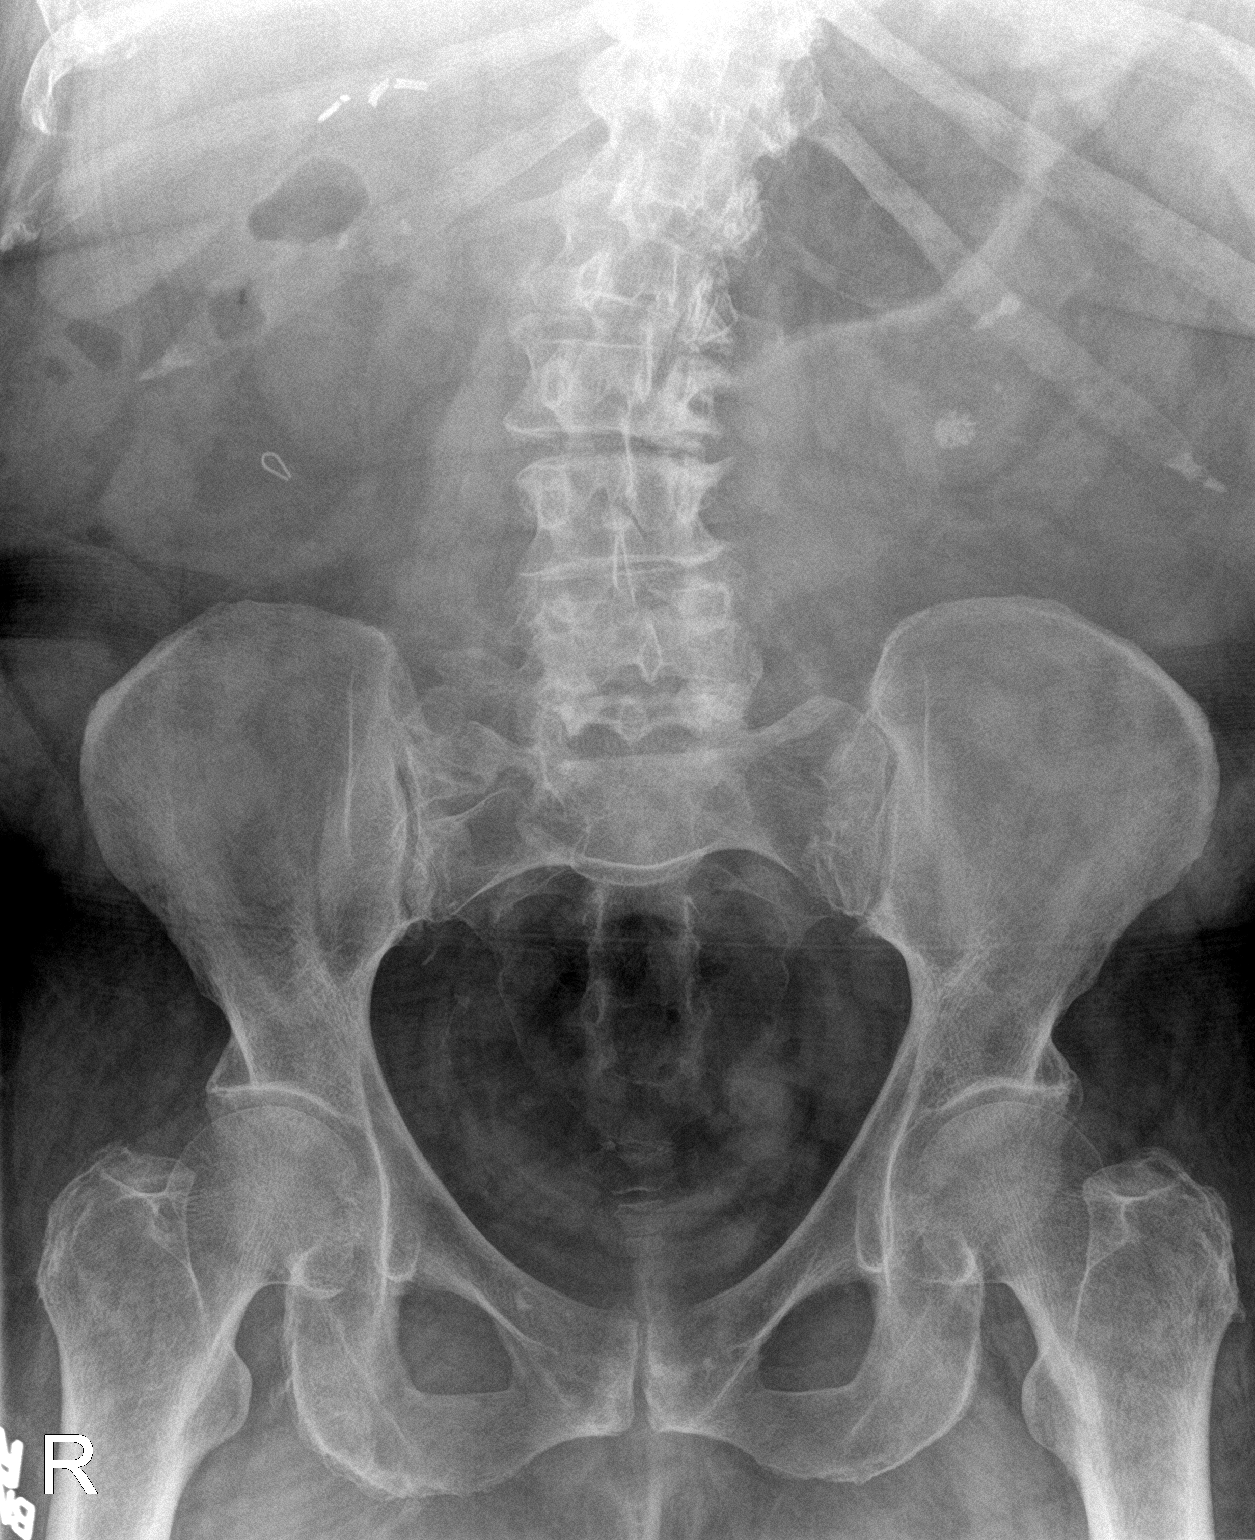

[1 of 1 positions shown; findings below may reference images not displayed]

FINDINGS: Double-J stent is no longer present. There are stable calculi in the
right kidney measuring 3-4 mm. There is a calculus in the lower pole
of the left kidney measuring 1.2 x 1.0 cm, stable. There are calculi
in the mid left kidney measuring 0.7 x 0.6 cm and 0.8 x 0.4 cm
respectively. There is a 2 mm calculus in the lower pole kidney. A
previously noted 6 x 3 mm calculus is no longer seen on the left. No
new calculi seen. There are phleboliths in the pelvis. The bowel gas
pattern is unremarkable. There are surgical clips in the right
abdomen. There is lumbar dextroscoliosis with degenerative change.
IMPRESSION: Multiple renal calculi, more on the left than on the right. One of
the calculi present previous on the left is no longer appreciable.
Other calculi appear essentially stable. No new calculi seen. Bowel
gas pattern unremarkable.

## 2015-05-18 ENCOUNTER — Ambulatory Visit (INDEPENDENT_AMBULATORY_CARE_PROVIDER_SITE_OTHER): Payer: Medicare Other | Admitting: Urology

## 2015-05-18 DIAGNOSIS — N200 Calculus of kidney: Secondary | ICD-10-CM

## 2015-05-18 DIAGNOSIS — N179 Acute kidney failure, unspecified: Secondary | ICD-10-CM | POA: Diagnosis not present

## 2015-05-31 DIAGNOSIS — M1000 Idiopathic gout, unspecified site: Secondary | ICD-10-CM | POA: Diagnosis not present

## 2015-05-31 DIAGNOSIS — M545 Low back pain: Secondary | ICD-10-CM | POA: Diagnosis not present

## 2015-05-31 DIAGNOSIS — E039 Hypothyroidism, unspecified: Secondary | ICD-10-CM | POA: Diagnosis not present

## 2015-05-31 DIAGNOSIS — Z23 Encounter for immunization: Secondary | ICD-10-CM | POA: Diagnosis not present

## 2015-05-31 DIAGNOSIS — E780 Pure hypercholesterolemia: Secondary | ICD-10-CM | POA: Diagnosis not present

## 2015-05-31 DIAGNOSIS — I10 Essential (primary) hypertension: Secondary | ICD-10-CM | POA: Diagnosis not present

## 2015-06-09 ENCOUNTER — Other Ambulatory Visit: Payer: Self-pay | Admitting: Dermatology

## 2015-06-09 DIAGNOSIS — L280 Lichen simplex chronicus: Secondary | ICD-10-CM | POA: Diagnosis not present

## 2015-06-09 DIAGNOSIS — L570 Actinic keratosis: Secondary | ICD-10-CM | POA: Diagnosis not present

## 2015-06-09 DIAGNOSIS — L820 Inflamed seborrheic keratosis: Secondary | ICD-10-CM | POA: Diagnosis not present

## 2015-06-09 DIAGNOSIS — D485 Neoplasm of uncertain behavior of skin: Secondary | ICD-10-CM | POA: Diagnosis not present

## 2015-06-09 DIAGNOSIS — L281 Prurigo nodularis: Secondary | ICD-10-CM | POA: Diagnosis not present

## 2015-07-21 DIAGNOSIS — M1712 Unilateral primary osteoarthritis, left knee: Secondary | ICD-10-CM | POA: Diagnosis not present

## 2015-07-26 DIAGNOSIS — L821 Other seborrheic keratosis: Secondary | ICD-10-CM | POA: Diagnosis not present

## 2015-07-26 DIAGNOSIS — L905 Scar conditions and fibrosis of skin: Secondary | ICD-10-CM | POA: Diagnosis not present

## 2015-07-27 DIAGNOSIS — M5106 Intervertebral disc disorders with myelopathy, lumbar region: Secondary | ICD-10-CM | POA: Diagnosis not present

## 2015-08-09 DIAGNOSIS — M79641 Pain in right hand: Secondary | ICD-10-CM | POA: Diagnosis not present

## 2015-08-09 DIAGNOSIS — G5601 Carpal tunnel syndrome, right upper limb: Secondary | ICD-10-CM | POA: Diagnosis not present

## 2015-08-09 DIAGNOSIS — M109 Gout, unspecified: Secondary | ICD-10-CM | POA: Diagnosis not present

## 2015-09-16 ENCOUNTER — Encounter (HOSPITAL_COMMUNITY): Payer: Self-pay | Admitting: Emergency Medicine

## 2015-09-16 ENCOUNTER — Emergency Department (HOSPITAL_COMMUNITY): Payer: Medicare Other

## 2015-09-16 ENCOUNTER — Emergency Department (HOSPITAL_COMMUNITY)
Admission: EM | Admit: 2015-09-16 | Discharge: 2015-09-16 | Disposition: A | Discharge status: Home / Self Care | Payer: Medicare Other | Attending: Emergency Medicine | Admitting: Emergency Medicine

## 2015-09-16 DIAGNOSIS — Z86010 Personal history of colonic polyps: Secondary | ICD-10-CM | POA: Diagnosis not present

## 2015-09-16 DIAGNOSIS — I471 Supraventricular tachycardia: Secondary | ICD-10-CM | POA: Diagnosis not present

## 2015-09-16 DIAGNOSIS — R079 Chest pain, unspecified: Secondary | ICD-10-CM

## 2015-09-16 DIAGNOSIS — G529 Cranial nerve disorder, unspecified: Secondary | ICD-10-CM | POA: Diagnosis not present

## 2015-09-16 DIAGNOSIS — Z791 Long term (current) use of non-steroidal anti-inflammatories (NSAID): Secondary | ICD-10-CM | POA: Diagnosis not present

## 2015-09-16 DIAGNOSIS — R110 Nausea: Secondary | ICD-10-CM | POA: Insufficient documentation

## 2015-09-16 DIAGNOSIS — N209 Urinary calculus, unspecified: Secondary | ICD-10-CM | POA: Insufficient documentation

## 2015-09-16 DIAGNOSIS — E039 Hypothyroidism, unspecified: Secondary | ICD-10-CM | POA: Insufficient documentation

## 2015-09-16 DIAGNOSIS — Z79899 Other long term (current) drug therapy: Secondary | ICD-10-CM | POA: Diagnosis not present

## 2015-09-16 DIAGNOSIS — I10 Essential (primary) hypertension: Secondary | ICD-10-CM | POA: Diagnosis not present

## 2015-09-16 DIAGNOSIS — M1990 Unspecified osteoarthritis, unspecified site: Secondary | ICD-10-CM | POA: Insufficient documentation

## 2015-09-16 DIAGNOSIS — Z8719 Personal history of other diseases of the digestive system: Secondary | ICD-10-CM | POA: Diagnosis not present

## 2015-09-16 LAB — CBC WITH DIFFERENTIAL/PLATELET
Basophils Absolute: 0 10*3/uL (ref 0.0–0.1)
Basophils Relative: 1 %
EOS ABS: 0.2 10*3/uL (ref 0.0–0.7)
Eosinophils Relative: 3 %
HCT: 44.5 % (ref 36.0–46.0)
HEMOGLOBIN: 14.5 g/dL (ref 12.0–15.0)
LYMPHS ABS: 2.7 10*3/uL (ref 0.7–4.0)
Lymphocytes Relative: 32 %
MCH: 29.1 pg (ref 26.0–34.0)
MCHC: 32.6 g/dL (ref 30.0–36.0)
MCV: 89.2 fL (ref 78.0–100.0)
MONOS PCT: 8 %
Monocytes Absolute: 0.7 10*3/uL (ref 0.1–1.0)
NEUTROS PCT: 56 %
Neutro Abs: 5 10*3/uL (ref 1.7–7.7)
Platelets: 312 10*3/uL (ref 150–400)
RBC: 4.99 MIL/uL (ref 3.87–5.11)
RDW: 14.4 % (ref 11.5–15.5)
WBC: 8.6 10*3/uL (ref 4.0–10.5)

## 2015-09-16 LAB — COMPREHENSIVE METABOLIC PANEL
ALK PHOS: 62 U/L (ref 38–126)
ALT: 18 U/L (ref 14–54)
ANION GAP: 7 (ref 5–15)
AST: 22 U/L (ref 15–41)
Albumin: 4.1 g/dL (ref 3.5–5.0)
BILIRUBIN TOTAL: 0.4 mg/dL (ref 0.3–1.2)
BUN: 36 mg/dL — ABNORMAL HIGH (ref 6–20)
CALCIUM: 9.4 mg/dL (ref 8.9–10.3)
CO2: 24 mmol/L (ref 22–32)
CREATININE: 1.27 mg/dL — AB (ref 0.44–1.00)
Chloride: 106 mmol/L (ref 101–111)
GFR, EST AFRICAN AMERICAN: 49 mL/min — AB (ref >60)
GFR, EST NON AFRICAN AMERICAN: 42 mL/min — AB (ref >60)
Glucose, Bld: 134 mg/dL — ABNORMAL HIGH (ref 65–99)
Potassium: 4 mmol/L (ref 3.5–5.1)
Sodium: 137 mmol/L (ref 135–145)
TOTAL PROTEIN: 7.4 g/dL (ref 6.5–8.1)

## 2015-09-16 LAB — I-STAT TROPONIN, ED: TROPONIN I, POC: 0 ng/mL (ref 0.00–0.08)

## 2015-09-16 LAB — TSH: TSH: 1.502 u[IU]/mL (ref 0.350–4.500)

## 2015-09-16 IMAGING — CR DG CHEST 1V PORT
1 series · 1 of 1 positions shown · non-contrast
Comparison: [DATE] chest radiograph.

CLINICAL DATA: Chest pain

EXAM:
PORTABLE CHEST 1 VIEW

[pa]
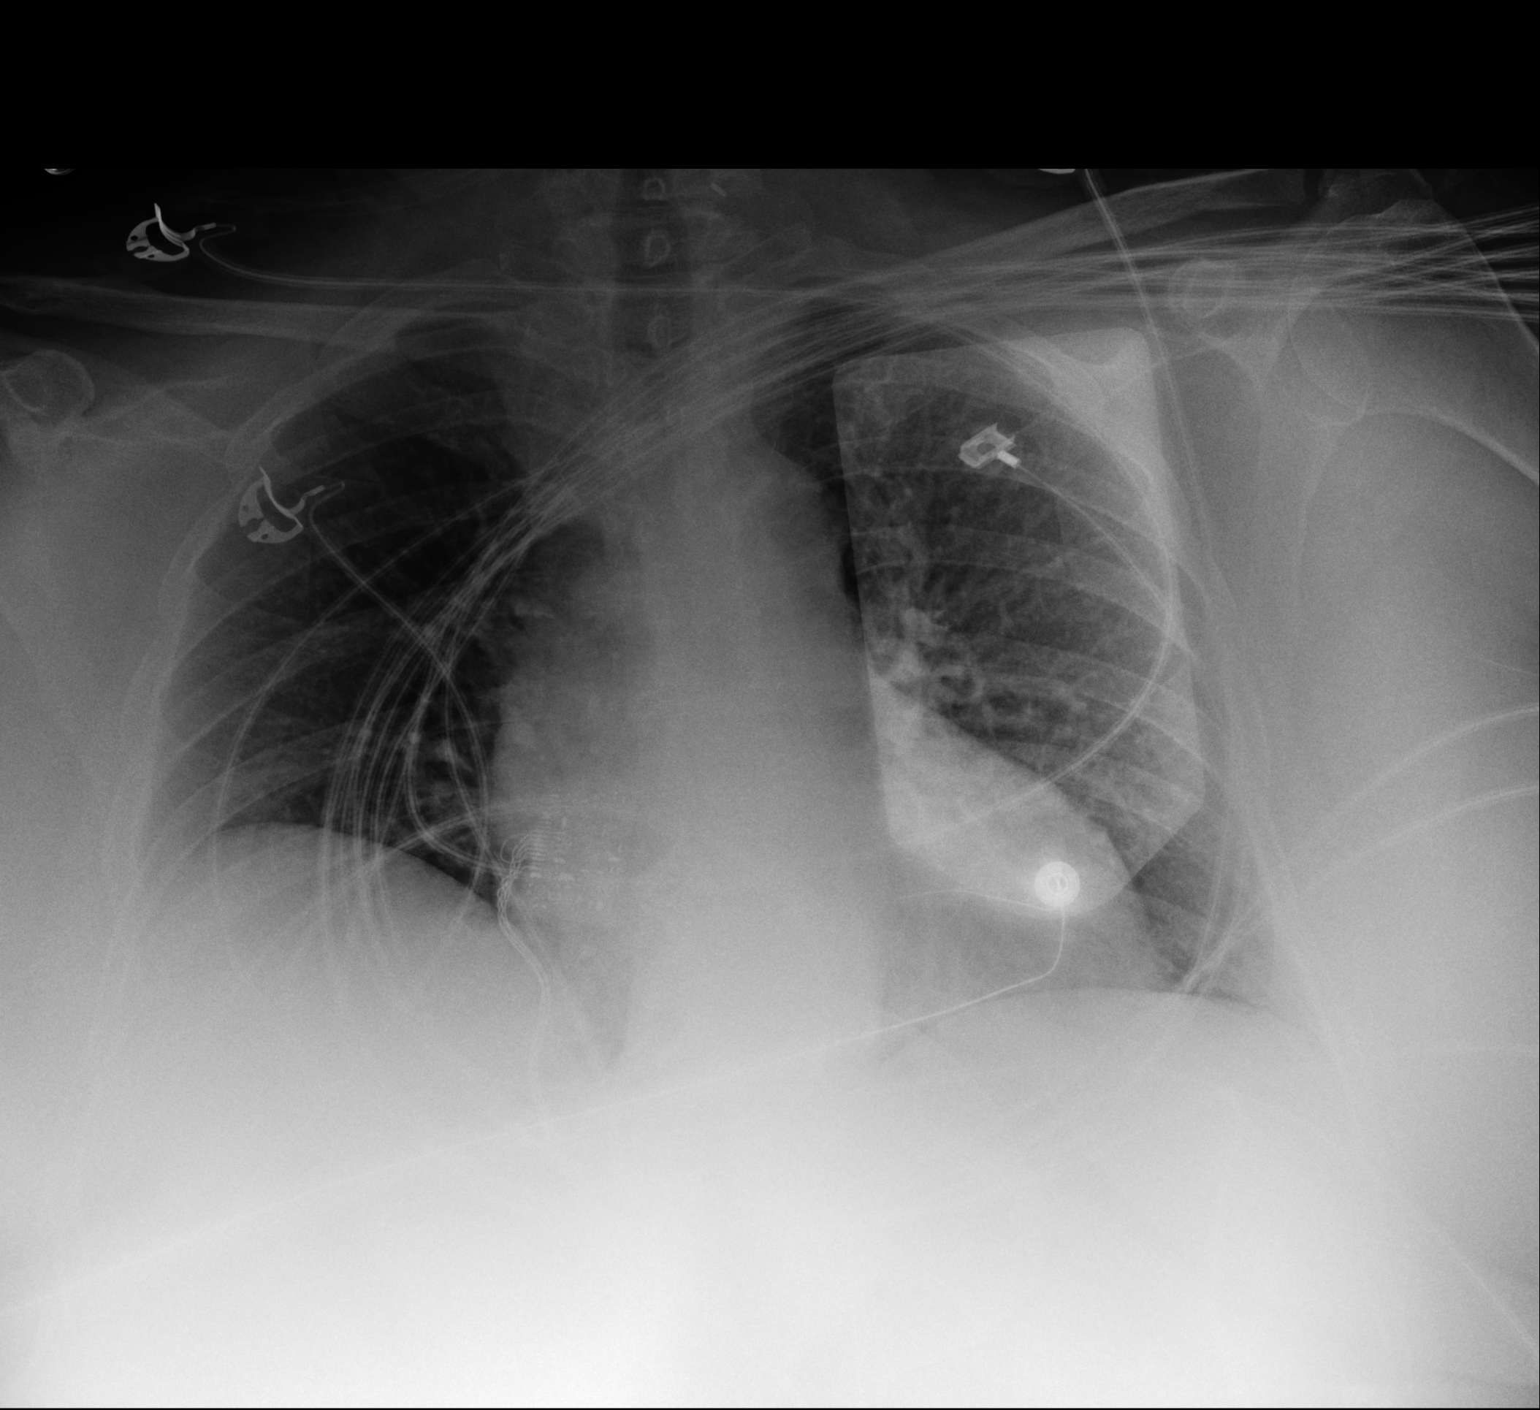

[1 of 1 positions shown; findings below may reference images not displayed]

FINDINGS: External pad overlies the left chest. Low lung volumes. Stable
cardiomediastinal silhouette with top-normal heart size. No
pneumothorax. No pleural effusion. Clear lungs, with no focal lung
consolidation and no pulmonary edema.
IMPRESSION: Limited hypoinspiratory portable chest radiograph with no
appreciable active cardiopulmonary disease.

## 2015-09-16 MED ORDER — ADENOSINE 6 MG/2ML IV SOLN
6.0000 mg | Freq: Once | INTRAVENOUS | Status: AC
  Administered 2015-09-16: 6 mg via INTRAVENOUS

## 2015-09-16 MED ORDER — SODIUM CHLORIDE 0.9 % IV BOLUS (SEPSIS)
1000.0000 mL | Freq: Once | INTRAVENOUS | Status: AC
Start: 2015-09-16 — End: 2015-09-16
  Administered 2015-09-16: 1000 mL via INTRAVENOUS

## 2015-09-16 MED ORDER — ADENOSINE 6 MG/2ML IV SOLN
INTRAVENOUS | Status: AC
  Filled 2015-09-16: qty 4

## 2015-09-16 NOTE — ED Provider Notes (Signed)
CSN: [id number]     Arrival date & time 09/16/15  1202 History   First MD Initiated Contact with Patient 09/16/15 1221     Chief Complaint  Patient presents with  . Chest Pain     (Consider location/radiation/quality/duration/timing/severity/associated sxs/prior Treatment) Patient is a 69 y.o. female presenting with palpitations.  Palpitations Palpitations quality:  Regular Onset quality:  Sudden Duration:  2 hours Timing:  Constant Chronicity:  New Context: not anxiety and not blood loss   Relieved by:  None tried Worsened by:  Nothing Ineffective treatments:  None tried Associated symptoms: chest pain and nausea   Associated symptoms: no back pain, no cough and no shortness of breath     Past Medical History  Diagnosis Date  . Hypertension   . Thyroid disorder   . Irritable bowel syndrome   . Seasonal allergies   . Hypothyroidism   . Colon polyps   . Arthritis   . Hyperlipidemia   . Urolithiasis   . History of gout   . Nocturia   . Hyperparathyroidism, primary ([place])   . Renal disorder     kidney stones  . History of gout   . Bulging lumbar disc    Past Surgical History  Procedure Laterality Date  . Bladder tack  09/1984  . Lithotripsy  06/1995  . 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 Rotor cuff  2011    Right Shoulder  . Knee arthroscopy  12/2010    Left knee  . Colonoscopy  06/2006    This was her second one  . Appendectomy  1954  . Cholecystectomy  01/1999  . Colonoscopy  07/12/2011    Procedure: COLONOSCOPY;  Surgeon: Rogene Houston, MD;  Location: [facility];  Service: Endoscopy;  Laterality: N/A;  10:30 am  . Thyroidectomy N/A 07/31/2013    Procedure: PARATHYROIDECTOMY;  Surgeon: Odis Hollingshead, MD;  Location: [facility];  Service: General;  Laterality: N/A;  . Thyroid exploration N/A 07/31/2013    Procedure: neck EXPLORATION;  Surgeon: Odis Hollingshead, MD;   Location: [facility];  Service: General;  Laterality: N/A;  . Cystoscopy w/ ureteral stent placement Left 10/03/2014    Procedure: CYSTOSCOPY WITH URETERAL STENT PLACEMENT;  Surgeon: Malka So, MD;  Location: [facility];  Service: Urology;  Laterality: Left;  . Abdominal hysterectomy  1986  . Nephrolithotomy Left 01/07/2015    Procedure: NEPHROLITHOTOMY PERCUTANEOUS;  Surgeon: Franchot Gallo, MD;  Location: [facility];  Service: Urology;  Laterality: Left;  With STENT  . Holmium laser application Left [id number]    Procedure: HOLMIUM LASER APPLICATION;  Surgeon: Franchot Gallo, MD;  Location: [facility];  Service: Urology;  Laterality: Left;  . Ureteroscopy with holmium laser lithotripsy Left 03/16/2015    Procedure: LEFT URETEROSCOPIC LASER LITHOTRIPSY WITH STONE EXTRACTION;  Surgeon: Franchot Gallo, MD;  Location: [facility];  Service: Urology;  Laterality: Left;  . Stent removal Left 03/16/2015    Procedure: LEFT URETERAL STENT REMOVAL;  Surgeon: Franchot Gallo, MD;  Location: [facility];  Service: Urology;  Laterality: Left;  . Cystoscopy with ureteroscopy and stent placement Left 03/16/2015    Procedure: CYSTOSCOPY WITH LEFT STENT PLACEMENT;  Surgeon: Franchot Gallo, MD;  Location: [facility];  Service: Urology;  Laterality: Left;   Family History  Problem Relation Age of Onset  . Hypertension Mother   . Arthritis Mother   .  Hypertension Father   . Healthy Brother   . Healthy Daughter   . Healthy Daughter   . Colon cancer Neg Hx    Social History  Substance Use Topics  . Smoking status: Never Smoker   . Smokeless tobacco: Never Used  . Alcohol Use: Yes     Comment: Very Rarely 1/2 glass of beer   OB History    No data available     Review of Systems  Constitutional: Negative for fever.  HENT: Negative for congestion and facial swelling.   Eyes: Negative for discharge and redness.  Respiratory: Negative for cough and shortness of breath.   Cardiovascular: Positive for chest pain and  palpitations.  Gastrointestinal: Positive for nausea. Negative for abdominal pain and abdominal distention.  Endocrine: Negative for polydipsia.  Genitourinary: Negative for dysuria.  Musculoskeletal: Negative for back pain.  Skin: Negative for wound.  Neurological: Negative for headaches.  All other systems reviewed and are negative.     Allergies  Ace inhibitors  Home Medications   Prior to Admission medications   Medication Sig Start Date End Date Taking? Authorizing Provider  levothyroxine (SYNTHROID, LEVOTHROID) 50 MCG tablet Take 50 mcg by mouth daily before breakfast.    Yes Historical Provider, MD  LORazepam (ATIVAN) 0.5 MG tablet Take 0.5 mg by mouth at bedtime as needed for sleep.    Yes Historical Provider, MD  losartan-hydrochlorothiazide (HYZAAR) 100-25 MG per tablet Take 1 tablet by mouth every morning.    Yes Historical Provider, MD  meloxicam (MOBIC) 7.5 MG tablet Take 7.5 mg by mouth daily.   Yes Historical Provider, MD  metoprolol succinate (TOPROL-XL) 50 MG 24 hr tablet Take 50 mg by mouth every morning. Take with or immediately following a meal.   Yes Historical Provider, MD  potassium citrate (UROCIT-K) 10 MEQ (1080 MG) SR tablet Take 10 mEq by mouth 3 (three) times daily with meals.   Yes Historical Provider, MD   BP 108/70 mmHg  Pulse 68  Temp(Src) 98.3 F (36.8 C) (Oral)  Resp 12  Ht 5\' 3"  (1.6 m)  Wt 220 lb (99.791 kg)  BMI 38.98 kg/m2  SpO2 100% Physical Exam  Constitutional: She is oriented to person, place, and time. She appears well-developed and well-nourished.  HENT:  Head: Normocephalic and atraumatic.  Eyes: Conjunctivae are normal. Pupils are equal, round, and reactive to light.  Neck: Normal range of motion.  Cardiovascular: Regular rhythm.  Tachycardia present.   Pulmonary/Chest: No stridor. No respiratory distress. She has no wheezes. She has no rales.  Abdominal: Soft. She exhibits no distension. There is no tenderness.   Musculoskeletal: Normal range of motion. She exhibits no edema or tenderness.  Neurological: She is alert and oriented to person, place, and time. A cranial nerve deficit is present. Coordination normal.  Nursing note and vitals reviewed.   ED Course  Procedures (including critical care time)  CRITICAL CARE Performed by: Merrily Pew   Total critical care time: 45 minutes  Critical care time was exclusive of separately billable procedures and treating other patients.  Critical care was necessary to treat or prevent imminent or life-threatening deterioration.  Critical care was time spent personally by me on the following activities: development of treatment plan with patient and/or surrogate as well as nursing, discussions with consultants, evaluation of patient's response to treatment, examination of patient, obtaining history from patient or surrogate, ordering and performing treatments and interventions, ordering and review of laboratory studies, ordering and review of radiographic studies,  pulse oximetry and re-evaluation of patient's condition.   Labs Review Labs Reviewed  COMPREHENSIVE METABOLIC PANEL - Abnormal; Notable for the following:    Glucose, Bld 134 (*)    BUN 36 (*)    Creatinine, Ser 1.27 (*)    GFR calc non Af Amer 42 (*)    GFR calc Af Amer 49 (*)    All other components within normal limits  CBC WITH DIFFERENTIAL/PLATELET  TSH  I-STAT TROPOININ, ED    Imaging Review Dg Chest Port 1 View  09/16/2015  CLINICAL DATA:  Chest pain EXAM: PORTABLE CHEST 1 VIEW COMPARISON:  10/03/2014 chest radiograph. FINDINGS: External pad overlies the left chest. Low lung volumes. Stable cardiomediastinal silhouette with top-normal heart size. No pneumothorax. No pleural effusion. Clear lungs, with no focal lung consolidation and no pulmonary edema. IMPRESSION: Limited hypoinspiratory portable chest radiograph with no appreciable active cardiopulmonary disease. Electronically  Signed   By: Ilona Sorrel M.D.   On: 09/16/2015 13:18   I have personally reviewed and evaluated these images and lab results as part of my medical decision-making.   EKG Interpretation None      My ECG Read Indication:tachycardia EKG was personally contemporaneously reviewed by myself. Rate: 171 PR Interval: unable to calculate Axis: normal EKG: Supraventricular tachycardia with rate related st changes Other significant findings: none    MDM   Final diagnoses:  SVT (supraventricular tachycardia) ([place])    69 yo F here with some cp associated with SVT just started prior to arrival while performing normal daily activities. No light headedness, AMS, leg swelling or dyspnea. Initial exam had HR of 171 appeared to be SVT. Attempted vagal maneuvers without success. Attempted vagal maneuver with trendelenbrung without success. Converted with 6mg  adenosine. Observed for approximately 2 hours with improving chest pain and normal HR. Will d/w cardiologist about further management.     Merrily Pew, MD 09/16/15 [phone]

## 2015-09-16 NOTE — ED Notes (Signed)
Pt's HR 111 after 6mg  of Adenosine. MD Mesner at bedside. Pt alert.

## 2015-09-16 NOTE — ED Notes (Signed)
PT c/o left sided chest pain radiating into both jaws x1 hour while doing housework with SOB.

## 2015-09-19 HISTORY — PX: OTHER SURGICAL HISTORY: SHX169

## 2015-09-28 ENCOUNTER — Ambulatory Visit (INDEPENDENT_AMBULATORY_CARE_PROVIDER_SITE_OTHER): Payer: Medicare Other | Admitting: Cardiology

## 2015-09-28 ENCOUNTER — Encounter: Payer: Self-pay | Admitting: Cardiology

## 2015-09-28 VITALS — BP 126/66 | HR 81 | Ht 63.0 in | Wt 224.0 lb

## 2015-09-28 DIAGNOSIS — R0789 Other chest pain: Secondary | ICD-10-CM | POA: Diagnosis not present

## 2015-09-28 DIAGNOSIS — R011 Cardiac murmur, unspecified: Secondary | ICD-10-CM | POA: Diagnosis not present

## 2015-09-28 DIAGNOSIS — I471 Supraventricular tachycardia, unspecified: Secondary | ICD-10-CM

## 2015-09-28 MED ORDER — METOPROLOL SUCCINATE ER 50 MG PO TB24: ORAL_TABLET | ORAL | Status: AC

## 2015-09-28 NOTE — Progress Notes (Signed)
Patient ID: Natasha Warren, female   DOB: 08-Sep-1946, 70 y.o.   MRN: [id number]     Clinical Summary Ms. Patient is a 70 y.o.female seen today as a new patient for the following medical problems.  1. SVT - reports she had been moving clothes from her closet when had sudden onset of palpitations. . Feeling of heart pounding, had some jaw pain. No SOB, no lightheadness. Came to ER and found to be in SVT. Converted with 6mg  of IV adenosine.  - labs showed trop neg, TSH 1.5, K 4, Cr 1.27 - had been on Toprol XL 50mg  daily for several years, compliant  2. Chest pain - occasional chest dull pain 2-3/10. At rest or with exertion, often with laying down. Not positional. Can occur after meals. Lasts approx 15-20 minutes. No SOB or palpitations. Ongoing for 1 year.   Past Medical History  Diagnosis Date  . Hypertension   . Thyroid disorder   . Irritable bowel syndrome   . Seasonal allergies   . Hypothyroidism   . Colon polyps   . Arthritis   . Hyperlipidemia   . Urolithiasis   . History of gout   . Nocturia   . Hyperparathyroidism, primary ([place])   . Renal disorder     kidney stones  . History of gout   . Bulging lumbar disc      Allergies  Allergen Reactions  . Ace Inhibitors     Patient doesn't remember this allergy.  On 12/30/2014 received LOV visit from PCP , Dr Rory Percy in [place], [place].  - No known active drug allergies listed in his office visit note dated 05/21/2014.       Current Outpatient Prescriptions  Medication Sig Dispense Refill  . levothyroxine (SYNTHROID, LEVOTHROID) 50 MCG tablet Take 50 mcg by mouth daily before breakfast.     . LORazepam (ATIVAN) 0.5 MG tablet Take 0.5 mg by mouth at bedtime as needed for sleep.     Marland Kitchen losartan-hydrochlorothiazide (HYZAAR) 100-25 MG per tablet Take 1 tablet by mouth every morning.     . meloxicam (MOBIC) 7.5 MG tablet Take 7.5 mg by mouth daily.    . metoprolol succinate (TOPROL-XL) 50 MG 24 hr tablet Take 50 mg by mouth every  morning. Take with or immediately following a meal.    . potassium citrate (UROCIT-K) 10 MEQ (1080 MG) SR tablet Take 10 mEq by mouth 3 (three) times daily with meals.     No current facility-administered medications for this visit.   Facility-Administered Medications Ordered in Other Visits  Medication Dose Route Frequency Provider Last Rate Last Dose  . acetaminophen (TYLENOL) tablet 650 mg  650 mg Oral Q4H PRN Irine Seal, MD         Past Surgical History  Procedure Laterality Date  . Bladder tack  09/1984  . Lithotripsy  06/1995  . 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 Rotor cuff  2011    Right Shoulder  . Knee arthroscopy  12/2010    Left knee  . Colonoscopy  06/2006    This was her second one  . Appendectomy  1954  . Cholecystectomy  01/1999  . Colonoscopy  07/12/2011    Procedure: COLONOSCOPY;  Surgeon: Rogene Houston, MD;  [facility];  Service: Endoscopy;  Laterality: N/A;  10:30 am  . Thyroidectomy N/A 07/31/2013  Procedure: PARATHYROIDECTOMY;  Surgeon: Odis Hollingshead, MD;  Location: [facility];  Service: General;  Laterality: N/A;  . Thyroid exploration N/A 07/31/2013    Procedure: neck EXPLORATION;  Surgeon: Odis Hollingshead, MD;  Location: [facility];  Service: General;  Laterality: N/A;  . Cystoscopy w/ ureteral stent placement Left 10/03/2014    Procedure: CYSTOSCOPY WITH URETERAL STENT PLACEMENT;  Surgeon: Malka So, MD;  [facility];  Service: Urology;  Laterality: Left;  . Abdominal hysterectomy  1986  . Nephrolithotomy Left 01/07/2015    Procedure: NEPHROLITHOTOMY PERCUTANEOUS;  Surgeon: Franchot Gallo, MD;  [facility];  Service: Urology;  Laterality: Left;  With STENT  . Holmium laser application Left [id number]    Procedure: HOLMIUM LASER APPLICATION;  Surgeon: Franchot Gallo, MD;  [facility];  Service: Urology;  Laterality: Left;  . Ureteroscopy  with holmium laser lithotripsy Left 03/16/2015    Procedure: LEFT URETEROSCOPIC LASER LITHOTRIPSY WITH STONE EXTRACTION;  Surgeon: Franchot Gallo, MD;  Location: [facility];  Service: Urology;  Laterality: Left;  . Stent removal Left 03/16/2015    Procedure: LEFT URETERAL STENT REMOVAL;  Surgeon: Franchot Gallo, MD;  Location: [facility];  Service: Urology;  Laterality: Left;  . Cystoscopy with ureteroscopy and stent placement Left 03/16/2015    Procedure: CYSTOSCOPY WITH LEFT STENT PLACEMENT;  Surgeon: Franchot Gallo, MD;  Location: [facility];  Service: Urology;  Laterality: Left;     Allergies  Allergen Reactions  . Ace Inhibitors     Patient doesn't remember this allergy.  On 12/30/2014 received LOV visit from PCP , Dr Rory Percy in [place], [place].  - No known active drug allergies listed in his office visit note dated 05/21/2014.        Family History  Problem Relation Age of Onset  . Hypertension Mother   . Arthritis Mother   . Hypertension Father   . Healthy Brother   . Healthy Daughter   . Healthy Daughter   . Colon cancer Neg Hx      Social History Ms. Dowda reports that she has never smoked. She has never used smokeless tobacco. Ms. Palleschi reports that she drinks alcohol.   Review of Systems CONSTITUTIONAL: No weight loss, fever, chills, weakness or fatigue.  HEENT: Eyes: No visual loss, blurred vision, double vision or yellow sclerae.No hearing loss, sneezing, congestion, runny nose or sore throat.  SKIN: No rash or itching.  CARDIOVASCULAR: per HPI RESPIRATORY: No shortness of breath, cough or sputum.  GASTROINTESTINAL: No anorexia, nausea, vomiting or diarrhea. No abdominal pain or blood.  GENITOURINARY: No burning on urination, no polyuria NEUROLOGICAL: No headache, dizziness, syncope, paralysis, ataxia, numbness or tingling in the extremities. No change in bowel or bladder control.  MUSCULOSKELETAL: No muscle, back pain, joint pain or stiffness.  LYMPHATICS: No  enlarged nodes. No history of splenectomy.  PSYCHIATRIC: No history of depression or anxiety.  ENDOCRINOLOGIC: No reports of sweating, cold or heat intolerance. No polyuria or polydipsia.  Marland Kitchen   Physical Examination Filed Vitals:   09/28/15 0822  BP: 126/66  Pulse: 81   Filed Weights   09/28/15 0822  Weight: 224 lb (101.606 kg)    Gen: resting comfortably, no acute distress HEENT: no scleral icterus, pupils equal round and reactive, no palptable cervical adenopathy,  CV: RRR, 2/6 systolic murmur RUSB, no jvd Resp: Clear to auscultation bilaterally GI: abdomen is soft, non-tender, non-distended, normal bowel sounds, no hepatosplenomegaly MSK: extremities are warm, no edema.  Skin: warm, no rash  Neuro:  no focal deficits Psych: appropriate affect   Diagnostic Studies ER labs and ekg reviewed in clinic    Assessment and Plan  1. SVT - episode of SVT, likely AVNRT.We will increase her Toprol XL to 75mg  daily  2. Chest pain - atypical chest pain, typically comes on with laying down or after meals. She will try over the counter zantac  3. Heart murmur - obtain echo    F/u 1 month. She is being considered for carpal tunnel syndrome, we will follow her PSVT and chest pain over the next few weeks prior to granting clearance.   Arnoldo Lenis, M.D.

## 2015-09-28 NOTE — Patient Instructions (Addendum)
Medication Instructions:  INCREASE TOPROL XL to 75 MG DAILY (1 &1/2 TABLETS DAILY) START ZANTAC 150 MG TWO TIMES DAILY Labwork: NONE  Testing/Procedures: Your physician has requested that you have an echocardiogram. Echocardiography is a painless test that uses sound waves to create images of your heart. It provides your doctor with information about the size and shape of your heart and how well your heart's chambers and valves are working. This procedure takes approximately one hour. There are no restrictions for this procedure.    Follow-Up: Your physician recommends that you schedule a follow-up appointment in: [place] DR. BRANCH   Any Other Special Instructions Will Be Listed Below (If Applicable).     If you need a refill on your cardiac medications before your next appointment, please call your pharmacy.

## 2015-09-29 ENCOUNTER — Ambulatory Visit (HOSPITAL_COMMUNITY)
Admission: RE | Admit: 2015-09-29 | Discharge: 2015-09-29 | Disposition: A | Discharge status: Home / Self Care | Payer: Medicare Other | Source: Ambulatory Visit | Attending: Cardiology | Admitting: Cardiology

## 2015-09-29 DIAGNOSIS — I081 Rheumatic disorders of both mitral and tricuspid valves: Secondary | ICD-10-CM | POA: Insufficient documentation

## 2015-09-29 DIAGNOSIS — R011 Cardiac murmur, unspecified: Secondary | ICD-10-CM

## 2015-10-01 ENCOUNTER — Telehealth: Payer: Self-pay | Admitting: Cardiology

## 2015-10-01 NOTE — Telephone Encounter (Signed)
Please call patient regarding testing prior to surgery. / tg

## 2015-10-01 NOTE — Telephone Encounter (Signed)
Pt told to keep fu apt with Dr Harl Bowie

## 2015-10-29 ENCOUNTER — Encounter: Payer: Self-pay | Admitting: Cardiology

## 2015-10-29 ENCOUNTER — Ambulatory Visit (INDEPENDENT_AMBULATORY_CARE_PROVIDER_SITE_OTHER): Payer: Medicare Other | Admitting: Cardiology

## 2015-10-29 VITALS — BP 118/74 | HR 80 | Ht 63.0 in | Wt 223.0 lb

## 2015-10-29 DIAGNOSIS — Z01810 Encounter for preprocedural cardiovascular examination: Secondary | ICD-10-CM | POA: Diagnosis not present

## 2015-10-29 DIAGNOSIS — I471 Supraventricular tachycardia: Secondary | ICD-10-CM

## 2015-10-29 DIAGNOSIS — R0789 Other chest pain: Secondary | ICD-10-CM | POA: Diagnosis not present

## 2015-10-29 NOTE — Patient Instructions (Signed)
Your physician wants you to follow-up in: 6 months You will receive a reminder letter in the mail two months in advance. If you don't receive a letter, please call our office to schedule the follow-up appointment.    Your physician recommends that you continue on your current medications as directed. Please refer to the Current Medication list given to you today.    If you need a refill on your cardiac medications before your next appointment, please call your pharmacy.     Thank you for choosing [facility] !        

## 2015-10-29 NOTE — Progress Notes (Signed)
Patient ID: Natasha Warren, female   DOB: 1946-04-09, 70 y.o.   MRN: [id number]     Clinical Summary Natasha Warren is a 70 y.o.female seen today for follow up of the following medical problems.   1. SVT - last visit she reported she had been moving clothes from her closet when had sudden onset of palpitations. . Feeling of heart pounding, had some jaw pain. No SOB, no lightheadness. Came to ER and found to be in SVT. Converted with 6mg  of IV adenosine.  - labs showed trop neg, TSH 1.5, K 4, Cr 1.27 - had been on Toprol XL 50mg  daily for several years, compliant  - last visit we increased her Toprol XL to 75mg  daily. Since that time no recurrent palpitations  2. Chest pain - occasional chest dull pain 2-3/10. At rest or with exertion, often with laying down. Can occur after meals. Lasts approx 15-20 minutes. No SOB or palpitations. Ongoing for 1 year, symptoms unchanged since last visit  3. Preoperative evaluation - patient with planned carpal tunnel surgery  Past Medical History  Diagnosis Date  . Hypertension   . Thyroid disorder   . Irritable bowel syndrome   . Seasonal allergies   . Hypothyroidism   . Colon polyps   . Arthritis   . Hyperlipidemia   . Urolithiasis   . History of gout   . Nocturia   . Hyperparathyroidism, primary ([place])   . Renal disorder     kidney stones  . History of gout   . Bulging lumbar disc      Allergies  Allergen Reactions  . Ace Inhibitors     Patient doesn't remember this allergy.  On 12/30/2014 received LOV visit from PCP , Dr Natasha Warren in [place], [place].  - No known active drug allergies listed in his office visit note dated 05/21/2014.       Current Outpatient Prescriptions  Medication Sig Dispense Refill  . levothyroxine (SYNTHROID, LEVOTHROID) 50 MCG tablet Take 50 mcg by mouth daily before breakfast.     . LORazepam (ATIVAN) 0.5 MG tablet Take 0.5 mg by mouth at bedtime as needed for sleep.     Marland Kitchen losartan-hydrochlorothiazide (HYZAAR)  100-25 MG per tablet Take 1 tablet by mouth every morning.     . meloxicam (MOBIC) 7.5 MG tablet Take 7.5 mg by mouth daily.    . metoprolol succinate (TOPROL-XL) 50 MG 24 hr tablet Take 1 & 1/2 tablets daily. 45 tablet 135  . potassium citrate (UROCIT-K) 10 MEQ (1080 MG) SR tablet Take 10 mEq by mouth 3 (three) times daily with meals.     No current facility-administered medications for this visit.   Facility-Administered Medications Ordered in Other Visits  Medication Dose Route Frequency Provider Last Rate Last Dose  . acetaminophen (TYLENOL) tablet 650 mg  650 mg Oral Q4H PRN Irine Seal, MD         Past Surgical History  Procedure Laterality Date  . Bladder tack  09/1984  . Lithotripsy  06/1995  . 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 Rotor cuff  2011    Right Shoulder  . Knee arthroscopy  12/2010    Left knee  . Colonoscopy  06/2006    This was her second one  . Appendectomy  1954  . Cholecystectomy  01/1999  . Colonoscopy  07/12/2011  Procedure: COLONOSCOPY;  Surgeon: Rogene Houston, MD;  [facility];  Service: Endoscopy;  Laterality: N/A;  10:30 am  . Thyroidectomy N/A 07/31/2013    Procedure: PARATHYROIDECTOMY;  Surgeon: Odis Hollingshead, MD;  [facility];  Service: General;  Laterality: N/A;  . Thyroid exploration N/A 07/31/2013    Procedure: neck EXPLORATION;  Surgeon: Odis Hollingshead, MD;  [facility];  Service: General;  Laterality: N/A;  . Cystoscopy w/ ureteral stent placement Left 10/03/2014    Procedure: CYSTOSCOPY WITH URETERAL STENT PLACEMENT;  Surgeon: Malka So, MD;  [facility];  Service: Urology;  Laterality: Left;  . Abdominal hysterectomy  1986  . Nephrolithotomy Left 01/07/2015    Procedure: NEPHROLITHOTOMY PERCUTANEOUS;  Surgeon: Franchot Gallo, MD;  [facility];  Service: Urology;  Laterality: Left;  With STENT  . Holmium laser  application Left [id number]    Procedure: HOLMIUM LASER APPLICATION;  Surgeon: Franchot Gallo, MD;  [facility];  Service: Urology;  Laterality: Left;  . Ureteroscopy with holmium laser lithotripsy Left 03/16/2015    Procedure: LEFT URETEROSCOPIC LASER LITHOTRIPSY WITH STONE EXTRACTION;  Surgeon: Franchot Gallo, MD;  [facility];  Service: Urology;  Laterality: Left;  . Stent removal Left 03/16/2015    Procedure: LEFT URETERAL STENT REMOVAL;  Surgeon: Franchot Gallo, MD;  [facility];  Service: Urology;  Laterality: Left;  . Cystoscopy with ureteroscopy and stent placement Left 03/16/2015    Procedure: CYSTOSCOPY WITH LEFT STENT PLACEMENT;  Surgeon: Franchot Gallo, MD;  [facility]: [facility];  Service: Urology;  Laterality: Left;     Allergies  Allergen Reactions  . Ace Inhibitors     Patient doesn't remember this allergy.  On 12/30/2014 received LOV visit from PCP , Dr Natasha Warren in [place], [place].  - No known active drug allergies listed in his office visit note dated 05/21/2014.        Family History  Problem Relation Age of Onset  . Hypertension Mother   . Arthritis Mother   . Hypertension Father   . Healthy Brother   . Healthy Daughter   . Healthy Daughter   . Colon cancer Neg Hx      Social History Natasha Warren reports that she has never smoked. She has never used smokeless tobacco. Natasha Warren reports that she drinks alcohol.   Review of Systems CONSTITUTIONAL: No weight loss, fever, chills, weakness or fatigue.  HEENT: Eyes: No visual loss, blurred vision, double vision or yellow sclerae.No hearing loss, sneezing, congestion, runny nose or sore throat.  SKIN: No rash or itching.  CARDIOVASCULAR: per hpi RESPIRATORY: No shortness of breath, cough or sputum.  GASTROINTESTINAL: No anorexia, nausea, vomiting or diarrhea. No abdominal pain or blood.  GENITOURINARY: No burning on urination, no polyuria NEUROLOGICAL: No headache, dizziness, syncope, paralysis,  ataxia, numbness or tingling in the extremities. No change in bowel or bladder control.  MUSCULOSKELETAL: No muscle, back pain, joint pain or stiffness.  LYMPHATICS: No enlarged nodes. No history of splenectomy.  PSYCHIATRIC: No history of depression or anxiety.  ENDOCRINOLOGIC: No reports of sweating, cold or heat intolerance. No polyuria or polydipsia.  Marland Kitchen   Physical Examination Filed Vitals:   10/29/15 0919  BP: 118/74  Pulse: 80   Filed Vitals:   10/29/15 0919  Height: 5\' 3"  (1.6 m)  Weight: 223 lb (101.152 kg)    Gen: resting comfortably, no acute distress HEENT: no scleral icterus, pupils equal round and reactive, no palptable cervical adenopathy,  CV: RRR, 2/6 systolic murmur RUSB, no jvd Resp: Clear to auscultation bilaterally GI: abdomen is soft, non-tender, non-distended, normal bowel sounds, no hepatosplenomegaly MSK: extremities are warm, no edema.  Skin: warm, no rash Neuro:  no focal deficits Psych: appropriate affect   Diagnostic Studies Jan 2017 echo Study Conclusions  - Left ventricle: The cavity size was normal. Wall thickness was increased in a pattern of mild LVH. Systolic function was normal. The estimated ejection fraction was in the range of 60% to 65%. Wall motion was normal; there were no regional wall motion abnormalities. Doppler parameters are consistent with abnormal left ventricular relaxation (grade 1 diastolic dysfunction). - Aortic valve: Mildly calcified annulus. - Mitral valve: Calcified annulus. There was mild regurgitation. - Tricuspid valve: There was mild regurgitation.    Assessment and Plan   1. SVT - recent episode of SVT, likely AVNRT. No recurrent symptoms since we increased her beta blocker, continue current meds  2. Chest pain - atypical chest pain, typically comes on with laying down or after meals.  - recent normal echo - no further cardiac work up at this time.   3. Preoperative evaluation - recommend  proceeding with carpal tunnel surgery   F/u 6 months  Arnoldo Lenis, M.D.     Arnoldo Lenis, M.D., F.A.C.C.

## 2015-11-02 DIAGNOSIS — M5106 Intervertebral disc disorders with myelopathy, lumbar region: Secondary | ICD-10-CM | POA: Diagnosis not present

## 2015-11-05 ENCOUNTER — Ambulatory Visit (HOSPITAL_COMMUNITY)
Admission: RE | Admit: 2015-11-05 | Discharge: 2015-11-05 | Disposition: A | Discharge status: Home / Self Care | Payer: Medicare Other | Source: Ambulatory Visit | Attending: Urology | Admitting: Urology

## 2015-11-05 ENCOUNTER — Other Ambulatory Visit: Payer: Self-pay | Admitting: Urology

## 2015-11-05 DIAGNOSIS — M5134 Other intervertebral disc degeneration, thoracic region: Secondary | ICD-10-CM | POA: Diagnosis not present

## 2015-11-05 DIAGNOSIS — N200 Calculus of kidney: Secondary | ICD-10-CM | POA: Diagnosis not present

## 2015-11-05 DIAGNOSIS — M4185 Other forms of scoliosis, thoracolumbar region: Secondary | ICD-10-CM | POA: Insufficient documentation

## 2015-11-05 DIAGNOSIS — Z87442 Personal history of urinary calculi: Secondary | ICD-10-CM | POA: Insufficient documentation

## 2015-11-05 IMAGING — DX DG ABDOMEN 1V
1 series · 1 of 1 positions shown · non-contrast
Comparison: Prior radiographs of the abdomen [DATE]

CLINICAL DATA: 69-year-old female with a long history of
nephrolithiasis

EXAM:
ABDOMEN - 1 VIEW

[abdomen kub]
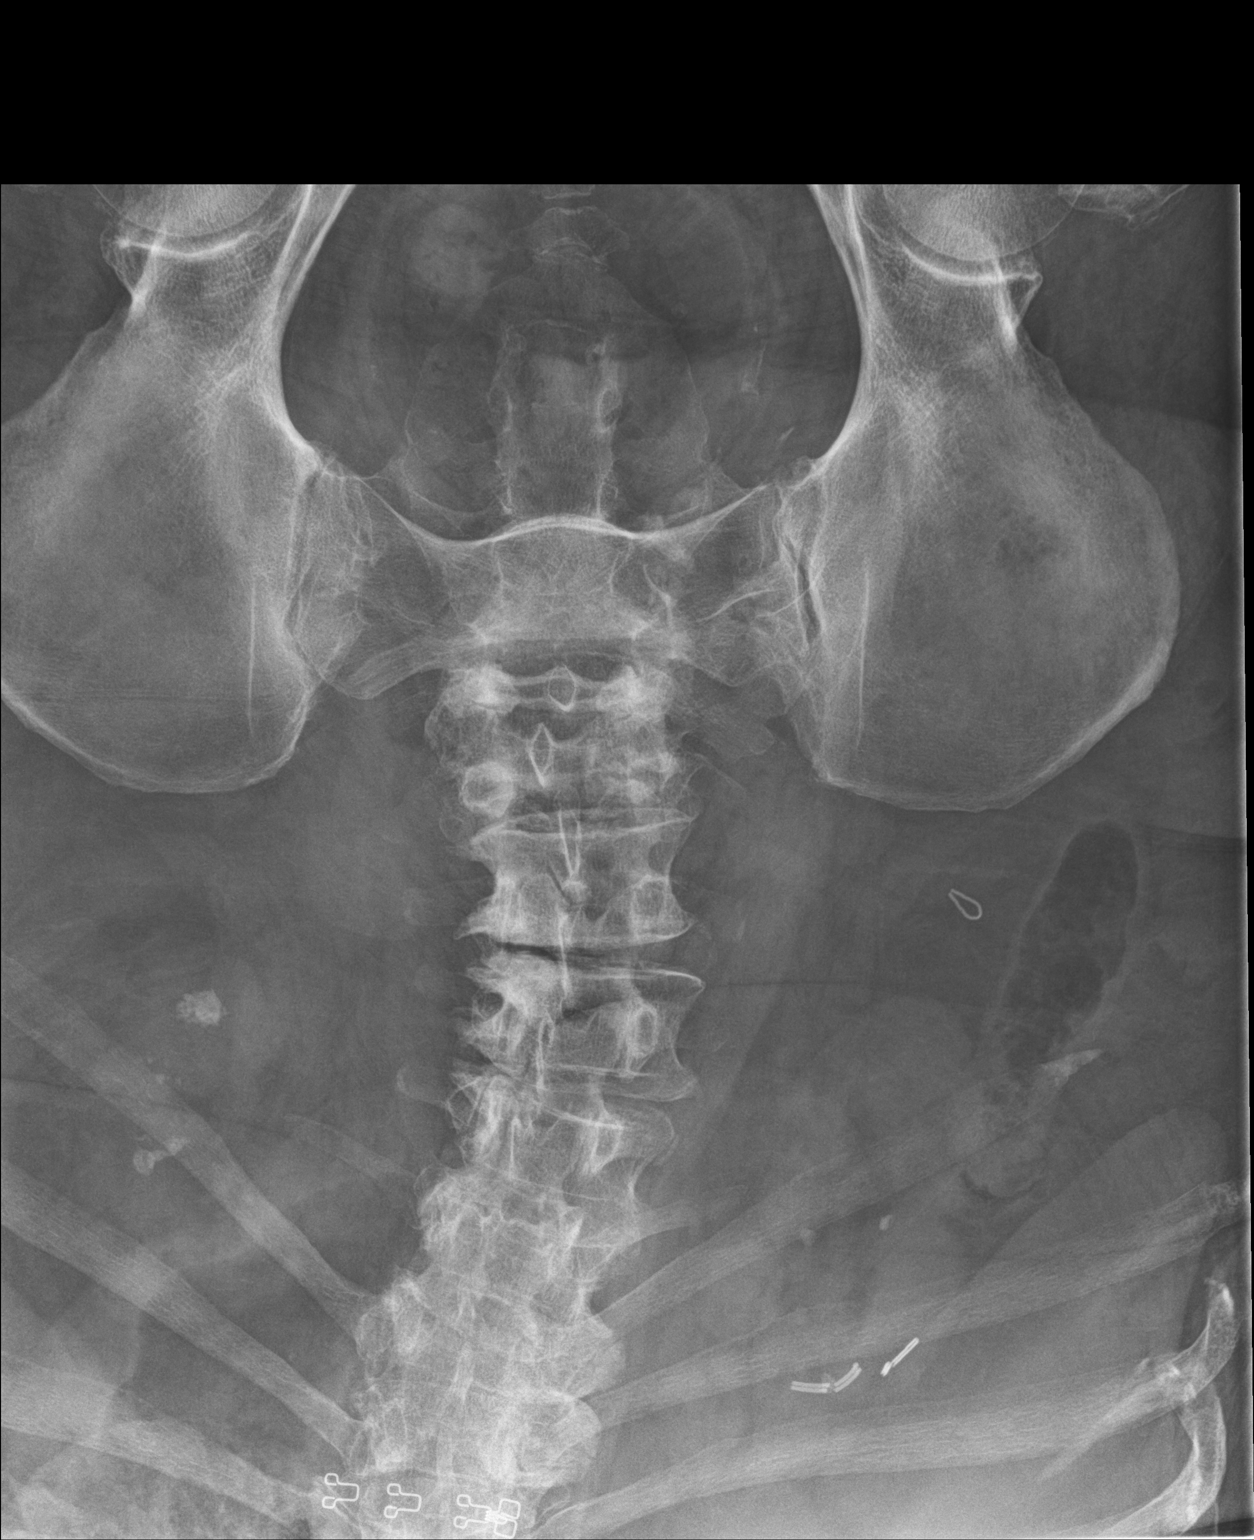

[1 of 1 positions shown; findings below may reference images not displayed]

FINDINGS: Multifocal bilateral nephrolithiasis again demonstrated. No
significant interval change compared to prior imaging. The largest
stone overlying the lower pole of the left kidney measures up to 11
mm. The largest stone in the right interpolar collecting system
measures approximately 4 mm. No definite radiopacity is overlying
the expected course of the ureters. A solitary surgical clip is
present in the right lower quadrant. This may represent prior
appendectomy. Dextro convex thoracolumbar scoliosis with multilevel
degenerative disc disease.
IMPRESSION: Stable bilateral nephrolithiasis compared [DATE].

Dextro convex thoracolumbar scoliosis with associated multilevel
degenerative disc disease.

## 2015-11-09 ENCOUNTER — Ambulatory Visit (INDEPENDENT_AMBULATORY_CARE_PROVIDER_SITE_OTHER): Payer: Medicare Other | Admitting: Urology

## 2015-11-09 DIAGNOSIS — N200 Calculus of kidney: Secondary | ICD-10-CM | POA: Diagnosis not present

## 2015-11-09 DIAGNOSIS — N201 Calculus of ureter: Secondary | ICD-10-CM | POA: Diagnosis not present

## 2015-12-01 DIAGNOSIS — N200 Calculus of kidney: Secondary | ICD-10-CM | POA: Diagnosis not present

## 2015-12-07 ENCOUNTER — Other Ambulatory Visit: Payer: Self-pay | Admitting: Orthopedic Surgery

## 2015-12-07 DIAGNOSIS — M19041 Primary osteoarthritis, right hand: Secondary | ICD-10-CM | POA: Diagnosis not present

## 2015-12-07 DIAGNOSIS — M109 Gout, unspecified: Secondary | ICD-10-CM | POA: Diagnosis not present

## 2015-12-07 DIAGNOSIS — M1A9XX1 Chronic gout, unspecified, with tophus (tophi): Secondary | ICD-10-CM | POA: Diagnosis not present

## 2015-12-07 DIAGNOSIS — G5601 Carpal tunnel syndrome, right upper limb: Secondary | ICD-10-CM | POA: Diagnosis not present

## 2015-12-15 DIAGNOSIS — Z4789 Encounter for other orthopedic aftercare: Secondary | ICD-10-CM | POA: Diagnosis not present

## 2015-12-15 DIAGNOSIS — G5601 Carpal tunnel syndrome, right upper limb: Secondary | ICD-10-CM | POA: Diagnosis not present

## 2015-12-15 DIAGNOSIS — M79641 Pain in right hand: Secondary | ICD-10-CM | POA: Diagnosis not present

## 2015-12-16 DIAGNOSIS — E7800 Pure hypercholesterolemia, unspecified: Secondary | ICD-10-CM | POA: Diagnosis not present

## 2015-12-16 DIAGNOSIS — E538 Deficiency of other specified B group vitamins: Secondary | ICD-10-CM | POA: Diagnosis not present

## 2015-12-16 DIAGNOSIS — E039 Hypothyroidism, unspecified: Secondary | ICD-10-CM | POA: Diagnosis not present

## 2015-12-16 DIAGNOSIS — M1000 Idiopathic gout, unspecified site: Secondary | ICD-10-CM | POA: Diagnosis not present

## 2015-12-16 DIAGNOSIS — I10 Essential (primary) hypertension: Secondary | ICD-10-CM | POA: Diagnosis not present

## 2015-12-20 DIAGNOSIS — Z13220 Encounter for screening for lipoid disorders: Secondary | ICD-10-CM | POA: Diagnosis not present

## 2015-12-20 DIAGNOSIS — E039 Hypothyroidism, unspecified: Secondary | ICD-10-CM | POA: Diagnosis not present

## 2015-12-20 DIAGNOSIS — M1000 Idiopathic gout, unspecified site: Secondary | ICD-10-CM | POA: Diagnosis not present

## 2015-12-20 DIAGNOSIS — I10 Essential (primary) hypertension: Secondary | ICD-10-CM | POA: Diagnosis not present

## 2015-12-20 DIAGNOSIS — Z0001 Encounter for general adult medical examination with abnormal findings: Secondary | ICD-10-CM | POA: Diagnosis not present

## 2015-12-20 DIAGNOSIS — M545 Low back pain: Secondary | ICD-10-CM | POA: Diagnosis not present

## 2015-12-22 DIAGNOSIS — Z4789 Encounter for other orthopedic aftercare: Secondary | ICD-10-CM | POA: Diagnosis not present

## 2015-12-23 DIAGNOSIS — M25641 Stiffness of right hand, not elsewhere classified: Secondary | ICD-10-CM | POA: Diagnosis not present

## 2015-12-23 DIAGNOSIS — G5601 Carpal tunnel syndrome, right upper limb: Secondary | ICD-10-CM | POA: Diagnosis not present

## 2015-12-23 DIAGNOSIS — M62541 Muscle wasting and atrophy, not elsewhere classified, right hand: Secondary | ICD-10-CM | POA: Diagnosis not present

## 2015-12-23 DIAGNOSIS — M79641 Pain in right hand: Secondary | ICD-10-CM | POA: Diagnosis not present

## 2015-12-28 DIAGNOSIS — M79641 Pain in right hand: Secondary | ICD-10-CM | POA: Diagnosis not present

## 2015-12-28 DIAGNOSIS — G5601 Carpal tunnel syndrome, right upper limb: Secondary | ICD-10-CM | POA: Diagnosis not present

## 2015-12-28 DIAGNOSIS — M62541 Muscle wasting and atrophy, not elsewhere classified, right hand: Secondary | ICD-10-CM | POA: Diagnosis not present

## 2015-12-28 DIAGNOSIS — M25641 Stiffness of right hand, not elsewhere classified: Secondary | ICD-10-CM | POA: Diagnosis not present

## 2015-12-30 DIAGNOSIS — M79641 Pain in right hand: Secondary | ICD-10-CM | POA: Diagnosis not present

## 2015-12-30 DIAGNOSIS — G5601 Carpal tunnel syndrome, right upper limb: Secondary | ICD-10-CM | POA: Diagnosis not present

## 2015-12-30 DIAGNOSIS — M25641 Stiffness of right hand, not elsewhere classified: Secondary | ICD-10-CM | POA: Diagnosis not present

## 2015-12-30 DIAGNOSIS — M62541 Muscle wasting and atrophy, not elsewhere classified, right hand: Secondary | ICD-10-CM | POA: Diagnosis not present

## 2016-01-04 DIAGNOSIS — M62541 Muscle wasting and atrophy, not elsewhere classified, right hand: Secondary | ICD-10-CM | POA: Diagnosis not present

## 2016-01-04 DIAGNOSIS — M79641 Pain in right hand: Secondary | ICD-10-CM | POA: Diagnosis not present

## 2016-01-04 DIAGNOSIS — G5601 Carpal tunnel syndrome, right upper limb: Secondary | ICD-10-CM | POA: Diagnosis not present

## 2016-01-04 DIAGNOSIS — M25641 Stiffness of right hand, not elsewhere classified: Secondary | ICD-10-CM | POA: Diagnosis not present

## 2016-01-05 DIAGNOSIS — S66911A Strain of unspecified muscle, fascia and tendon at wrist and hand level, right hand, initial encounter: Secondary | ICD-10-CM | POA: Diagnosis not present

## 2016-01-05 DIAGNOSIS — S63501A Unspecified sprain of right wrist, initial encounter: Secondary | ICD-10-CM | POA: Diagnosis not present

## 2016-01-05 DIAGNOSIS — M109 Gout, unspecified: Secondary | ICD-10-CM | POA: Diagnosis not present

## 2016-01-05 DIAGNOSIS — M25531 Pain in right wrist: Secondary | ICD-10-CM | POA: Diagnosis not present

## 2016-01-06 DIAGNOSIS — M25641 Stiffness of right hand, not elsewhere classified: Secondary | ICD-10-CM | POA: Diagnosis not present

## 2016-01-06 DIAGNOSIS — M62541 Muscle wasting and atrophy, not elsewhere classified, right hand: Secondary | ICD-10-CM | POA: Diagnosis not present

## 2016-01-06 DIAGNOSIS — M79641 Pain in right hand: Secondary | ICD-10-CM | POA: Diagnosis not present

## 2016-01-06 DIAGNOSIS — G5601 Carpal tunnel syndrome, right upper limb: Secondary | ICD-10-CM | POA: Diagnosis not present

## 2016-01-11 DIAGNOSIS — G5601 Carpal tunnel syndrome, right upper limb: Secondary | ICD-10-CM | POA: Diagnosis not present

## 2016-01-11 DIAGNOSIS — M79641 Pain in right hand: Secondary | ICD-10-CM | POA: Diagnosis not present

## 2016-01-11 DIAGNOSIS — M25641 Stiffness of right hand, not elsewhere classified: Secondary | ICD-10-CM | POA: Diagnosis not present

## 2016-01-11 DIAGNOSIS — M62541 Muscle wasting and atrophy, not elsewhere classified, right hand: Secondary | ICD-10-CM | POA: Diagnosis not present

## 2016-01-13 DIAGNOSIS — M25641 Stiffness of right hand, not elsewhere classified: Secondary | ICD-10-CM | POA: Diagnosis not present

## 2016-01-13 DIAGNOSIS — G5601 Carpal tunnel syndrome, right upper limb: Secondary | ICD-10-CM | POA: Diagnosis not present

## 2016-01-13 DIAGNOSIS — M79641 Pain in right hand: Secondary | ICD-10-CM | POA: Diagnosis not present

## 2016-01-13 DIAGNOSIS — M62541 Muscle wasting and atrophy, not elsewhere classified, right hand: Secondary | ICD-10-CM | POA: Diagnosis not present

## 2016-01-17 DIAGNOSIS — R2232 Localized swelling, mass and lump, left upper limb: Secondary | ICD-10-CM | POA: Diagnosis not present

## 2016-01-17 DIAGNOSIS — Z4789 Encounter for other orthopedic aftercare: Secondary | ICD-10-CM | POA: Diagnosis not present

## 2016-01-18 DIAGNOSIS — M25641 Stiffness of right hand, not elsewhere classified: Secondary | ICD-10-CM | POA: Diagnosis not present

## 2016-01-18 DIAGNOSIS — M62541 Muscle wasting and atrophy, not elsewhere classified, right hand: Secondary | ICD-10-CM | POA: Diagnosis not present

## 2016-01-18 DIAGNOSIS — M79641 Pain in right hand: Secondary | ICD-10-CM | POA: Diagnosis not present

## 2016-01-18 DIAGNOSIS — G5601 Carpal tunnel syndrome, right upper limb: Secondary | ICD-10-CM | POA: Diagnosis not present

## 2016-01-20 DIAGNOSIS — G5601 Carpal tunnel syndrome, right upper limb: Secondary | ICD-10-CM | POA: Diagnosis not present

## 2016-01-20 DIAGNOSIS — M62541 Muscle wasting and atrophy, not elsewhere classified, right hand: Secondary | ICD-10-CM | POA: Diagnosis not present

## 2016-01-20 DIAGNOSIS — M25641 Stiffness of right hand, not elsewhere classified: Secondary | ICD-10-CM | POA: Diagnosis not present

## 2016-01-20 DIAGNOSIS — M79641 Pain in right hand: Secondary | ICD-10-CM | POA: Diagnosis not present

## 2016-02-02 DIAGNOSIS — M5106 Intervertebral disc disorders with myelopathy, lumbar region: Secondary | ICD-10-CM | POA: Diagnosis not present

## 2016-02-15 ENCOUNTER — Other Ambulatory Visit: Payer: Self-pay | Admitting: Orthopedic Surgery

## 2016-02-15 DIAGNOSIS — M109 Gout, unspecified: Secondary | ICD-10-CM | POA: Diagnosis not present

## 2016-02-15 DIAGNOSIS — M1A9XX1 Chronic gout, unspecified, with tophus (tophi): Secondary | ICD-10-CM | POA: Diagnosis not present

## 2016-02-15 DIAGNOSIS — M65842 Other synovitis and tenosynovitis, left hand: Secondary | ICD-10-CM | POA: Diagnosis not present

## 2016-02-15 DIAGNOSIS — R2231 Localized swelling, mass and lump, right upper limb: Secondary | ICD-10-CM | POA: Diagnosis not present

## 2016-02-15 DIAGNOSIS — M65841 Other synovitis and tenosynovitis, right hand: Secondary | ICD-10-CM | POA: Diagnosis not present

## 2016-02-23 DIAGNOSIS — Z4789 Encounter for other orthopedic aftercare: Secondary | ICD-10-CM | POA: Diagnosis not present

## 2016-02-28 DIAGNOSIS — Z4789 Encounter for other orthopedic aftercare: Secondary | ICD-10-CM | POA: Diagnosis not present

## 2016-03-08 DIAGNOSIS — Z1231 Encounter for screening mammogram for malignant neoplasm of breast: Secondary | ICD-10-CM | POA: Diagnosis not present

## 2016-03-10 DIAGNOSIS — M109 Gout, unspecified: Secondary | ICD-10-CM | POA: Diagnosis not present

## 2016-03-10 DIAGNOSIS — Z4789 Encounter for other orthopedic aftercare: Secondary | ICD-10-CM | POA: Diagnosis not present

## 2016-03-10 DIAGNOSIS — M25531 Pain in right wrist: Secondary | ICD-10-CM | POA: Diagnosis not present

## 2016-03-15 DIAGNOSIS — M5106 Intervertebral disc disorders with myelopathy, lumbar region: Secondary | ICD-10-CM | POA: Diagnosis not present

## 2016-03-15 DIAGNOSIS — N200 Calculus of kidney: Secondary | ICD-10-CM | POA: Diagnosis not present

## 2016-03-15 DIAGNOSIS — M5136 Other intervertebral disc degeneration, lumbar region: Secondary | ICD-10-CM | POA: Diagnosis not present

## 2016-03-15 DIAGNOSIS — M419 Scoliosis, unspecified: Secondary | ICD-10-CM | POA: Diagnosis not present

## 2016-03-22 DIAGNOSIS — M5106 Intervertebral disc disorders with myelopathy, lumbar region: Secondary | ICD-10-CM | POA: Diagnosis not present

## 2016-04-24 DIAGNOSIS — E7800 Pure hypercholesterolemia, unspecified: Secondary | ICD-10-CM | POA: Diagnosis not present

## 2016-04-24 DIAGNOSIS — R739 Hyperglycemia, unspecified: Secondary | ICD-10-CM | POA: Diagnosis not present

## 2016-04-24 DIAGNOSIS — I10 Essential (primary) hypertension: Secondary | ICD-10-CM | POA: Diagnosis not present

## 2016-05-01 DIAGNOSIS — M545 Low back pain: Secondary | ICD-10-CM | POA: Diagnosis not present

## 2016-05-01 DIAGNOSIS — N184 Chronic kidney disease, stage 4 (severe): Secondary | ICD-10-CM | POA: Diagnosis not present

## 2016-05-01 DIAGNOSIS — E039 Hypothyroidism, unspecified: Secondary | ICD-10-CM | POA: Diagnosis not present

## 2016-05-01 DIAGNOSIS — I10 Essential (primary) hypertension: Secondary | ICD-10-CM | POA: Diagnosis not present

## 2016-05-01 DIAGNOSIS — Z6841 Body Mass Index (BMI) 40.0 and over, adult: Secondary | ICD-10-CM | POA: Diagnosis not present

## 2016-05-01 DIAGNOSIS — M1000 Idiopathic gout, unspecified site: Secondary | ICD-10-CM | POA: Diagnosis not present

## 2016-05-02 ENCOUNTER — Encounter: Payer: Self-pay | Admitting: Cardiology

## 2016-05-03 DIAGNOSIS — M1712 Unilateral primary osteoarthritis, left knee: Secondary | ICD-10-CM | POA: Diagnosis not present

## 2016-05-16 DIAGNOSIS — M4150 Other secondary scoliosis, site unspecified: Secondary | ICD-10-CM | POA: Diagnosis not present

## 2016-05-16 DIAGNOSIS — M5106 Intervertebral disc disorders with myelopathy, lumbar region: Secondary | ICD-10-CM | POA: Diagnosis not present

## 2016-05-17 ENCOUNTER — Other Ambulatory Visit: Payer: Self-pay

## 2016-05-24 ENCOUNTER — Encounter: Payer: Self-pay | Admitting: *Deleted

## 2016-05-26 ENCOUNTER — Encounter: Payer: Self-pay | Admitting: Cardiology

## 2016-05-26 ENCOUNTER — Ambulatory Visit (INDEPENDENT_AMBULATORY_CARE_PROVIDER_SITE_OTHER): Payer: Medicare Other | Admitting: Cardiology

## 2016-05-26 VITALS — BP 124/78 | HR 87 | Ht 63.0 in | Wt 223.0 lb

## 2016-05-26 DIAGNOSIS — Z01810 Encounter for preprocedural cardiovascular examination: Secondary | ICD-10-CM

## 2016-05-26 DIAGNOSIS — I471 Supraventricular tachycardia: Secondary | ICD-10-CM | POA: Diagnosis not present

## 2016-05-26 DIAGNOSIS — R0789 Other chest pain: Secondary | ICD-10-CM

## 2016-05-26 NOTE — Progress Notes (Signed)
Clinical Summary Ms. Chea is a 70 y.o.female seen today for follow up of the following medical problems.   1. PSVT - last visit we increased her Toprol XL to 75mg  daily. Since that time no recurrent palpitations - no recurrent symptoms.   2. Chest pain - history of inermittent chest dull pain 2-3/10. At rest or with exertion, often with laying down. Can occur after meals. Lasts approx 15-20 minutes. No SOB or palpitations. Ongoing for 1 year, symptoms unchanged since last visit - no recent symptoms.   3. Preoperative evaluation - upcoming back surgery.  - can walk up flights of stairs without significant limitation    SH: Her husband CYNTHIA COGLE is also a patient of mine.  Past Medical History:  Diagnosis Date  . Arthritis   . Bulging lumbar disc   . Colon polyps   . History of gout   . History of gout   . Hyperlipidemia   . Hyperparathyroidism, primary ([place])   . Hypertension   . Hypothyroidism   . Irritable bowel syndrome   . Nocturia   . Renal disorder    kidney stones  . Seasonal allergies   . Thyroid disorder   . Urolithiasis      Allergies  Allergen Reactions  . Ace Inhibitors     Patient doesn't remember this allergy.  On 12/30/2014 received LOV visit from PCP , Dr Rory Percy in [place], [place].  - No known active drug allergies listed in his office visit note dated 05/21/2014.       Current Outpatient Prescriptions  Medication Sig Dispense Refill  . levothyroxine (SYNTHROID, LEVOTHROID) 50 MCG tablet Take 50 mcg by mouth daily before breakfast.     . LORazepam (ATIVAN) 0.5 MG tablet Take 0.5 mg by mouth at bedtime as needed for sleep.     Marland Kitchen losartan-hydrochlorothiazide (HYZAAR) 100-25 MG per tablet Take 1 tablet by mouth every morning.     . metoprolol succinate (TOPROL-XL) 50 MG 24 hr tablet Take 1 & 1/2 tablets daily. 45 tablet 135  . potassium citrate (UROCIT-K) 10 MEQ (1080 MG) SR tablet Take 10 mEq by mouth 3 (three) times daily with meals.      No current facility-administered medications for this visit.    Facility-Administered Medications Ordered in Other Visits  Medication Dose Route Frequency Provider Last Rate Last Dose  . acetaminophen (TYLENOL) tablet 650 mg  650 mg Oral Q4H PRN Irine Seal, MD         Past Surgical History:  Procedure Laterality Date  . ABDOMINAL HYSTERECTOMY  1986  . APPENDECTOMY  1954  . bladder tack  09/1984  . CHOLECYSTECTOMY  01/1999  . COLONOSCOPY  06/2006   This was her second one  . COLONOSCOPY  07/12/2011   Procedure: COLONOSCOPY;  Surgeon: Rogene Houston, MD;  [facility];  Service: Endoscopy;  Laterality: N/A;  10:30 am  . CYSTOSCOPY W/ URETERAL STENT PLACEMENT Left 10/03/2014   Procedure: CYSTOSCOPY WITH URETERAL STENT PLACEMENT;  Surgeon: Malka So, MD;  Location: [facility];  Service: Urology;  Laterality: Left;  . CYSTOSCOPY WITH URETEROSCOPY AND STENT PLACEMENT Left 03/16/2015   Procedure: CYSTOSCOPY WITH LEFT STENT PLACEMENT;  Surgeon: Franchot Gallo, MD;  Location: [facility];  Service: Urology;  Laterality: Left;  . HOLMIUM LASER APPLICATION Left 12/30/2438   Procedure: HOLMIUM LASER APPLICATION;  Surgeon: Franchot Gallo, MD;  [facility];  Service: Urology;  Laterality: Left;  . KNEE ARTHROSCOPY  12/2010   Left knee  . LITHOTRIPSY  06/1995  . NEPHROLITHOTOMY Left 01/07/2015   Procedure: NEPHROLITHOTOMY PERCUTANEOUS;  Surgeon: Franchot Gallo, MD;  [facility];  Service: Urology;  Laterality: Left;  With STENT  . Rotor Cuff  2011   Right Shoulder  . STENT REMOVAL Left 03/16/2015   Procedure: LEFT URETERAL STENT REMOVAL;  Surgeon: Franchot Gallo, MD;  Location: [facility];  Service: Urology;  Laterality: Left;  . THYROID EXPLORATION N/A 07/31/2013   Procedure: neck EXPLORATION;  Surgeon: Odis Hollingshead, MD;  Location: [facility];  Service: General;  Laterality: N/A;  . THYROIDECTOMY N/A 07/31/2013   Procedure: PARATHYROIDECTOMY;  Surgeon: Odis Hollingshead,  MD;  Location: [facility];  Service: General;  Laterality: N/A;  . TUMOR REMOVAL  06/1997   Beign; on back x2 surgeries  . TUMOR REMOVAL  2002   "FATTY TUMORS" REMOVED FROM BACK  . URETEROSCOPY WITH HOLMIUM LASER LITHOTRIPSY Left 03/16/2015   Procedure: LEFT URETEROSCOPIC LASER LITHOTRIPSY WITH STONE EXTRACTION;  Surgeon: Franchot Gallo, MD;  Location: [facility];  Service: Urology;  Laterality: Left;     Allergies  Allergen Reactions  . Ace Inhibitors     Patient doesn't remember this allergy.  On 12/30/2014 received LOV visit from PCP , Dr Rory Percy in [place], [place].  - No known active drug allergies listed in his office visit note dated 05/21/2014.        Family History  Problem Relation Age of Onset  . Hypertension Mother   . Arthritis Mother   . Hypertension Father   . Healthy Brother   . Healthy Daughter   . Healthy Daughter   . Colon cancer Neg Hx      Social History Ms. Mcdougald reports that she has never smoked. She has never used smokeless tobacco. Ms. Louth reports that she drinks alcohol.   Review of Systems CONSTITUTIONAL: No weight loss, fever, chills, weakness or fatigue.  HEENT: Eyes: No visual loss, blurred vision, double vision or yellow sclerae.No hearing loss, sneezing, congestion, runny nose or sore throat.  SKIN: No rash or itching.  CARDIOVASCULAR: [er HPI RESPIRATORY: No shortness of breath, cough or sputum.  GASTROINTESTINAL: No anorexia, nausea, vomiting or diarrhea. No abdominal pain or blood.  GENITOURINARY: No burning on urination, no polyuria NEUROLOGICAL: No headache, dizziness, syncope, paralysis, ataxia, numbness or tingling in the extremities. No change in bowel or bladder control.  MUSCULOSKELETAL: No muscle, back pain, joint pain or stiffness.  LYMPHATICS: No enlarged nodes. No history of splenectomy.  PSYCHIATRIC: No history of depression or anxiety.  ENDOCRINOLOGIC: No reports of sweating, cold or heat intolerance. No polyuria or polydipsia.    Marland Kitchen   Physical Examination Vitals:   05/26/16 1500  BP: 124/78  Pulse: 87   Vitals:   05/26/16 1500  Weight: 223 lb (101.2 kg)  Height: 5\' 3"  (1.6 m)    Gen: resting comfortably, no acute distress HEENT: no scleral icterus, pupils equal round and reactive, no palptable cervical adenopathy,  CV: RRR, no m/r/g, no jvd Resp: Clear to auscultation bilaterally GI: abdomen is soft, non-tender, non-distended, normal bowel sounds, no hepatosplenomegaly MSK: extremities are warm, no edema.  Skin: warm, no rash Neuro:  no focal deficits Psych: appropriate affect   Diagnostic Studies Jan 2017 echo Study Conclusions  - Left ventricle: The cavity size was normal. Wall thickness was increased in a pattern of mild LVH. Systolic function was normal. The estimated ejection fraction was in the range of 60% to  65%. Wall motion was normal; there were no regional wall motion abnormalities. Doppler parameters are consistent with abnormal left ventricular relaxation (grade 1 diastolic dysfunction). - Aortic valve: Mildly calcified annulus. - Mitral valve: Calcified annulus. There was mild regurgitation. - Tricuspid valve: There was mild regurgitation.    Assessment and Plan  1. PSVT - no recent symptoms, continue beta blocker  2. Chest pain - atypical chest pain, typically comes on with laying down or after meals.  - no recent symptoms, continue to monitor  3. Preoperative evaluation - recommend proceeding with back surgery as planned   F/u 1 year       Arnoldo Lenis, M.D.

## 2016-05-26 NOTE — Patient Instructions (Signed)
Medication Instructions:  Continue all other medications.    Labwork: none  Testing/Procedures: none  Follow-Up: Your physician wants you to follow up in:  1 year.  You will receive a reminder letter in the mail one-two months in advance.  If you don't receive a letter, please call our office to schedule the follow up appointment   Any Other Special Instructions Will Be Listed Below (If Applicable).  If you need a refill on your cardiac medications before your next appointment, please call your pharmacy.

## 2016-05-29 ENCOUNTER — Ambulatory Visit: Payer: Medicare Other | Admitting: Cardiology

## 2016-05-29 ENCOUNTER — Other Ambulatory Visit (INDEPENDENT_AMBULATORY_CARE_PROVIDER_SITE_OTHER): Payer: Self-pay | Admitting: *Deleted

## 2016-05-29 DIAGNOSIS — Z86010 Personal history of colonic polyps: Secondary | ICD-10-CM | POA: Insufficient documentation

## 2016-05-31 ENCOUNTER — Encounter: Payer: Self-pay | Admitting: *Deleted

## 2016-05-31 DIAGNOSIS — Z23 Encounter for immunization: Secondary | ICD-10-CM | POA: Diagnosis not present

## 2016-06-18 HISTORY — PX: LUMBAR FUSION: SHX111

## 2016-06-28 DIAGNOSIS — Z79899 Other long term (current) drug therapy: Secondary | ICD-10-CM | POA: Diagnosis not present

## 2016-06-28 DIAGNOSIS — E039 Hypothyroidism, unspecified: Secondary | ICD-10-CM | POA: Diagnosis not present

## 2016-06-28 DIAGNOSIS — I10 Essential (primary) hypertension: Secondary | ICD-10-CM | POA: Diagnosis not present

## 2016-06-28 DIAGNOSIS — M1990 Unspecified osteoarthritis, unspecified site: Secondary | ICD-10-CM | POA: Diagnosis not present

## 2016-06-28 DIAGNOSIS — Z01810 Encounter for preprocedural cardiovascular examination: Secondary | ICD-10-CM | POA: Diagnosis not present

## 2016-06-28 DIAGNOSIS — K589 Irritable bowel syndrome without diarrhea: Secondary | ICD-10-CM | POA: Diagnosis not present

## 2016-06-28 DIAGNOSIS — M5126 Other intervertebral disc displacement, lumbar region: Secondary | ICD-10-CM | POA: Diagnosis not present

## 2016-06-28 DIAGNOSIS — E7800 Pure hypercholesterolemia, unspecified: Secondary | ICD-10-CM | POA: Diagnosis not present

## 2016-06-28 DIAGNOSIS — Z01818 Encounter for other preprocedural examination: Secondary | ICD-10-CM | POA: Diagnosis not present

## 2016-06-28 DIAGNOSIS — E669 Obesity, unspecified: Secondary | ICD-10-CM | POA: Diagnosis not present

## 2016-06-28 DIAGNOSIS — Z7984 Long term (current) use of oral hypoglycemic drugs: Secondary | ICD-10-CM | POA: Diagnosis not present

## 2016-07-03 DIAGNOSIS — R000 Tachycardia, unspecified: Secondary | ICD-10-CM | POA: Diagnosis not present

## 2016-07-03 DIAGNOSIS — R2689 Other abnormalities of gait and mobility: Secondary | ICD-10-CM | POA: Diagnosis not present

## 2016-07-03 DIAGNOSIS — M4186 Other forms of scoliosis, lumbar region: Secondary | ICD-10-CM | POA: Diagnosis not present

## 2016-07-03 DIAGNOSIS — E039 Hypothyroidism, unspecified: Secondary | ICD-10-CM | POA: Diagnosis not present

## 2016-07-03 DIAGNOSIS — M4150 Other secondary scoliosis, site unspecified: Secondary | ICD-10-CM | POA: Diagnosis not present

## 2016-07-03 DIAGNOSIS — M5106 Intervertebral disc disorders with myelopathy, lumbar region: Secondary | ICD-10-CM | POA: Diagnosis present

## 2016-07-03 DIAGNOSIS — M4716 Other spondylosis with myelopathy, lumbar region: Secondary | ICD-10-CM | POA: Diagnosis not present

## 2016-07-03 DIAGNOSIS — Z8249 Family history of ischemic heart disease and other diseases of the circulatory system: Secondary | ICD-10-CM | POA: Diagnosis not present

## 2016-07-03 DIAGNOSIS — M5126 Other intervertebral disc displacement, lumbar region: Secondary | ICD-10-CM | POA: Diagnosis not present

## 2016-07-03 DIAGNOSIS — Z79891 Long term (current) use of opiate analgesic: Secondary | ICD-10-CM | POA: Diagnosis not present

## 2016-07-03 DIAGNOSIS — M5186 Other intervertebral disc disorders, lumbar region: Secondary | ICD-10-CM | POA: Diagnosis not present

## 2016-07-03 DIAGNOSIS — E119 Type 2 diabetes mellitus without complications: Secondary | ICD-10-CM | POA: Diagnosis not present

## 2016-07-03 DIAGNOSIS — Z7984 Long term (current) use of oral hypoglycemic drugs: Secondary | ICD-10-CM | POA: Diagnosis not present

## 2016-07-03 DIAGNOSIS — K5900 Constipation, unspecified: Secondary | ICD-10-CM | POA: Diagnosis not present

## 2016-07-03 DIAGNOSIS — M47895 Other spondylosis, thoracolumbar region: Secondary | ICD-10-CM | POA: Diagnosis not present

## 2016-07-03 DIAGNOSIS — M419 Scoliosis, unspecified: Secondary | ICD-10-CM | POA: Diagnosis not present

## 2016-07-03 DIAGNOSIS — Z79899 Other long term (current) drug therapy: Secondary | ICD-10-CM | POA: Diagnosis not present

## 2016-07-03 DIAGNOSIS — Z4789 Encounter for other orthopedic aftercare: Secondary | ICD-10-CM | POA: Diagnosis not present

## 2016-07-03 DIAGNOSIS — I10 Essential (primary) hypertension: Secondary | ICD-10-CM | POA: Diagnosis not present

## 2016-07-03 DIAGNOSIS — M47816 Spondylosis without myelopathy or radiculopathy, lumbar region: Secondary | ICD-10-CM | POA: Diagnosis not present

## 2016-07-03 DIAGNOSIS — K219 Gastro-esophageal reflux disease without esophagitis: Secondary | ICD-10-CM | POA: Diagnosis not present

## 2016-07-03 DIAGNOSIS — M4326 Fusion of spine, lumbar region: Secondary | ICD-10-CM | POA: Diagnosis not present

## 2016-07-03 DIAGNOSIS — Z981 Arthrodesis status: Secondary | ICD-10-CM | POA: Diagnosis not present

## 2016-07-03 DIAGNOSIS — M6281 Muscle weakness (generalized): Secondary | ICD-10-CM | POA: Diagnosis not present

## 2016-07-03 DIAGNOSIS — M47894 Other spondylosis, thoracic region: Secondary | ICD-10-CM | POA: Diagnosis not present

## 2016-07-03 DIAGNOSIS — M47896 Other spondylosis, lumbar region: Secondary | ICD-10-CM | POA: Diagnosis not present

## 2016-07-03 DIAGNOSIS — Z823 Family history of stroke: Secondary | ICD-10-CM | POA: Diagnosis not present

## 2016-07-03 DIAGNOSIS — M109 Gout, unspecified: Secondary | ICD-10-CM | POA: Diagnosis not present

## 2016-07-03 DIAGNOSIS — M48061 Spinal stenosis, lumbar region without neurogenic claudication: Secondary | ICD-10-CM | POA: Diagnosis not present

## 2016-07-06 DIAGNOSIS — M5186 Other intervertebral disc disorders, lumbar region: Secondary | ICD-10-CM | POA: Diagnosis not present

## 2016-07-06 DIAGNOSIS — M419 Scoliosis, unspecified: Secondary | ICD-10-CM | POA: Diagnosis not present

## 2016-07-06 DIAGNOSIS — M5106 Intervertebral disc disorders with myelopathy, lumbar region: Secondary | ICD-10-CM | POA: Diagnosis not present

## 2016-07-10 DIAGNOSIS — I10 Essential (primary) hypertension: Secondary | ICD-10-CM | POA: Diagnosis not present

## 2016-07-10 DIAGNOSIS — K5900 Constipation, unspecified: Secondary | ICD-10-CM | POA: Diagnosis not present

## 2016-07-10 DIAGNOSIS — M109 Gout, unspecified: Secondary | ICD-10-CM | POA: Diagnosis not present

## 2016-07-10 DIAGNOSIS — M1611 Unilateral primary osteoarthritis, right hip: Secondary | ICD-10-CM | POA: Diagnosis not present

## 2016-07-10 DIAGNOSIS — K219 Gastro-esophageal reflux disease without esophagitis: Secondary | ICD-10-CM | POA: Diagnosis not present

## 2016-07-10 DIAGNOSIS — R937 Abnormal findings on diagnostic imaging of other parts of musculoskeletal system: Secondary | ICD-10-CM | POA: Diagnosis not present

## 2016-07-10 DIAGNOSIS — S76811A Strain of other specified muscles, fascia and tendons at thigh level, right thigh, initial encounter: Secondary | ICD-10-CM | POA: Diagnosis not present

## 2016-07-10 DIAGNOSIS — M25551 Pain in right hip: Secondary | ICD-10-CM | POA: Diagnosis not present

## 2016-07-10 DIAGNOSIS — Z4789 Encounter for other orthopedic aftercare: Secondary | ICD-10-CM | POA: Diagnosis not present

## 2016-07-10 DIAGNOSIS — Z981 Arthrodesis status: Secondary | ICD-10-CM | POA: Diagnosis not present

## 2016-07-10 DIAGNOSIS — I700 Atherosclerosis of aorta: Secondary | ICD-10-CM | POA: Diagnosis not present

## 2016-07-10 DIAGNOSIS — M6281 Muscle weakness (generalized): Secondary | ICD-10-CM | POA: Diagnosis not present

## 2016-07-10 DIAGNOSIS — R2689 Other abnormalities of gait and mobility: Secondary | ICD-10-CM | POA: Diagnosis not present

## 2016-07-10 DIAGNOSIS — M545 Low back pain: Secondary | ICD-10-CM | POA: Diagnosis not present

## 2016-07-10 DIAGNOSIS — M47896 Other spondylosis, lumbar region: Secondary | ICD-10-CM | POA: Diagnosis not present

## 2016-07-10 DIAGNOSIS — K5730 Diverticulosis of large intestine without perforation or abscess without bleeding: Secondary | ICD-10-CM | POA: Diagnosis not present

## 2016-07-10 DIAGNOSIS — E119 Type 2 diabetes mellitus without complications: Secondary | ICD-10-CM | POA: Diagnosis not present

## 2016-07-11 DIAGNOSIS — M545 Low back pain: Secondary | ICD-10-CM | POA: Diagnosis not present

## 2016-07-26 DIAGNOSIS — M1611 Unilateral primary osteoarthritis, right hip: Secondary | ICD-10-CM | POA: Diagnosis not present

## 2016-07-26 DIAGNOSIS — I700 Atherosclerosis of aorta: Secondary | ICD-10-CM | POA: Diagnosis not present

## 2016-07-26 DIAGNOSIS — M25551 Pain in right hip: Secondary | ICD-10-CM | POA: Diagnosis not present

## 2016-07-26 DIAGNOSIS — Z981 Arthrodesis status: Secondary | ICD-10-CM | POA: Diagnosis not present

## 2016-07-26 DIAGNOSIS — M545 Low back pain: Secondary | ICD-10-CM | POA: Diagnosis not present

## 2016-08-02 ENCOUNTER — Encounter (INDEPENDENT_AMBULATORY_CARE_PROVIDER_SITE_OTHER): Payer: Self-pay | Admitting: *Deleted

## 2016-08-03 DIAGNOSIS — M1611 Unilateral primary osteoarthritis, right hip: Secondary | ICD-10-CM | POA: Diagnosis not present

## 2016-08-03 DIAGNOSIS — R937 Abnormal findings on diagnostic imaging of other parts of musculoskeletal system: Secondary | ICD-10-CM | POA: Diagnosis not present

## 2016-08-03 DIAGNOSIS — M25551 Pain in right hip: Secondary | ICD-10-CM | POA: Diagnosis not present

## 2016-08-03 DIAGNOSIS — K5730 Diverticulosis of large intestine without perforation or abscess without bleeding: Secondary | ICD-10-CM | POA: Diagnosis not present

## 2016-08-03 DIAGNOSIS — Z981 Arthrodesis status: Secondary | ICD-10-CM | POA: Diagnosis not present

## 2016-08-03 DIAGNOSIS — S76811A Strain of other specified muscles, fascia and tendons at thigh level, right thigh, initial encounter: Secondary | ICD-10-CM | POA: Diagnosis not present

## 2016-08-14 DIAGNOSIS — M25551 Pain in right hip: Secondary | ICD-10-CM | POA: Diagnosis not present

## 2016-08-18 DIAGNOSIS — Z4789 Encounter for other orthopedic aftercare: Secondary | ICD-10-CM | POA: Diagnosis not present

## 2016-08-18 DIAGNOSIS — M109 Gout, unspecified: Secondary | ICD-10-CM | POA: Diagnosis not present

## 2016-08-18 DIAGNOSIS — E119 Type 2 diabetes mellitus without complications: Secondary | ICD-10-CM | POA: Diagnosis not present

## 2016-08-18 DIAGNOSIS — M6281 Muscle weakness (generalized): Secondary | ICD-10-CM | POA: Diagnosis not present

## 2016-08-18 DIAGNOSIS — M47896 Other spondylosis, lumbar region: Secondary | ICD-10-CM | POA: Diagnosis not present

## 2016-08-18 DIAGNOSIS — Z981 Arthrodesis status: Secondary | ICD-10-CM | POA: Diagnosis not present

## 2016-08-18 DIAGNOSIS — M545 Low back pain: Secondary | ICD-10-CM | POA: Diagnosis not present

## 2016-08-18 DIAGNOSIS — K5900 Constipation, unspecified: Secondary | ICD-10-CM | POA: Diagnosis not present

## 2016-08-18 DIAGNOSIS — K219 Gastro-esophageal reflux disease without esophagitis: Secondary | ICD-10-CM | POA: Diagnosis not present

## 2016-08-18 DIAGNOSIS — I10 Essential (primary) hypertension: Secondary | ICD-10-CM | POA: Diagnosis not present

## 2016-08-18 DIAGNOSIS — R2689 Other abnormalities of gait and mobility: Secondary | ICD-10-CM | POA: Diagnosis not present

## 2016-08-26 DIAGNOSIS — M545 Low back pain: Secondary | ICD-10-CM | POA: Diagnosis not present

## 2016-09-01 DIAGNOSIS — Z7984 Long term (current) use of oral hypoglycemic drugs: Secondary | ICD-10-CM | POA: Diagnosis not present

## 2016-09-01 DIAGNOSIS — Z4789 Encounter for other orthopedic aftercare: Secondary | ICD-10-CM | POA: Diagnosis not present

## 2016-09-01 DIAGNOSIS — E119 Type 2 diabetes mellitus without complications: Secondary | ICD-10-CM | POA: Diagnosis not present

## 2016-09-01 DIAGNOSIS — M6281 Muscle weakness (generalized): Secondary | ICD-10-CM | POA: Diagnosis not present

## 2016-09-01 DIAGNOSIS — I10 Essential (primary) hypertension: Secondary | ICD-10-CM | POA: Diagnosis not present

## 2016-09-01 DIAGNOSIS — R2689 Other abnormalities of gait and mobility: Secondary | ICD-10-CM | POA: Diagnosis not present

## 2016-09-05 DIAGNOSIS — R2689 Other abnormalities of gait and mobility: Secondary | ICD-10-CM | POA: Diagnosis not present

## 2016-09-05 DIAGNOSIS — Z7984 Long term (current) use of oral hypoglycemic drugs: Secondary | ICD-10-CM | POA: Diagnosis not present

## 2016-09-05 DIAGNOSIS — I10 Essential (primary) hypertension: Secondary | ICD-10-CM | POA: Diagnosis not present

## 2016-09-05 DIAGNOSIS — Z4789 Encounter for other orthopedic aftercare: Secondary | ICD-10-CM | POA: Diagnosis not present

## 2016-09-05 DIAGNOSIS — M6281 Muscle weakness (generalized): Secondary | ICD-10-CM | POA: Diagnosis not present

## 2016-09-05 DIAGNOSIS — E119 Type 2 diabetes mellitus without complications: Secondary | ICD-10-CM | POA: Diagnosis not present

## 2016-09-06 DIAGNOSIS — R2689 Other abnormalities of gait and mobility: Secondary | ICD-10-CM | POA: Diagnosis not present

## 2016-09-06 DIAGNOSIS — I10 Essential (primary) hypertension: Secondary | ICD-10-CM | POA: Diagnosis not present

## 2016-09-06 DIAGNOSIS — M6281 Muscle weakness (generalized): Secondary | ICD-10-CM | POA: Diagnosis not present

## 2016-09-06 DIAGNOSIS — E119 Type 2 diabetes mellitus without complications: Secondary | ICD-10-CM | POA: Diagnosis not present

## 2016-09-06 DIAGNOSIS — Z7984 Long term (current) use of oral hypoglycemic drugs: Secondary | ICD-10-CM | POA: Diagnosis not present

## 2016-09-06 DIAGNOSIS — Z4789 Encounter for other orthopedic aftercare: Secondary | ICD-10-CM | POA: Diagnosis not present

## 2016-09-07 DIAGNOSIS — I10 Essential (primary) hypertension: Secondary | ICD-10-CM | POA: Diagnosis not present

## 2016-09-07 DIAGNOSIS — E119 Type 2 diabetes mellitus without complications: Secondary | ICD-10-CM | POA: Diagnosis not present

## 2016-09-07 DIAGNOSIS — Z4789 Encounter for other orthopedic aftercare: Secondary | ICD-10-CM | POA: Diagnosis not present

## 2016-09-07 DIAGNOSIS — M6281 Muscle weakness (generalized): Secondary | ICD-10-CM | POA: Diagnosis not present

## 2016-09-07 DIAGNOSIS — R2689 Other abnormalities of gait and mobility: Secondary | ICD-10-CM | POA: Diagnosis not present

## 2016-09-07 DIAGNOSIS — Z7984 Long term (current) use of oral hypoglycemic drugs: Secondary | ICD-10-CM | POA: Diagnosis not present

## 2016-09-08 DIAGNOSIS — I10 Essential (primary) hypertension: Secondary | ICD-10-CM | POA: Diagnosis not present

## 2016-09-08 DIAGNOSIS — R2689 Other abnormalities of gait and mobility: Secondary | ICD-10-CM | POA: Diagnosis not present

## 2016-09-08 DIAGNOSIS — E119 Type 2 diabetes mellitus without complications: Secondary | ICD-10-CM | POA: Diagnosis not present

## 2016-09-08 DIAGNOSIS — Z7984 Long term (current) use of oral hypoglycemic drugs: Secondary | ICD-10-CM | POA: Diagnosis not present

## 2016-09-08 DIAGNOSIS — Z4789 Encounter for other orthopedic aftercare: Secondary | ICD-10-CM | POA: Diagnosis not present

## 2016-09-08 DIAGNOSIS — M6281 Muscle weakness (generalized): Secondary | ICD-10-CM | POA: Diagnosis not present

## 2016-09-12 DIAGNOSIS — M6281 Muscle weakness (generalized): Secondary | ICD-10-CM | POA: Diagnosis not present

## 2016-09-12 DIAGNOSIS — E119 Type 2 diabetes mellitus without complications: Secondary | ICD-10-CM | POA: Diagnosis not present

## 2016-09-12 DIAGNOSIS — Z7984 Long term (current) use of oral hypoglycemic drugs: Secondary | ICD-10-CM | POA: Diagnosis not present

## 2016-09-12 DIAGNOSIS — R2689 Other abnormalities of gait and mobility: Secondary | ICD-10-CM | POA: Diagnosis not present

## 2016-09-12 DIAGNOSIS — Z4789 Encounter for other orthopedic aftercare: Secondary | ICD-10-CM | POA: Diagnosis not present

## 2016-09-12 DIAGNOSIS — I10 Essential (primary) hypertension: Secondary | ICD-10-CM | POA: Diagnosis not present

## 2016-09-14 DIAGNOSIS — Z4789 Encounter for other orthopedic aftercare: Secondary | ICD-10-CM | POA: Diagnosis not present

## 2016-09-14 DIAGNOSIS — R2689 Other abnormalities of gait and mobility: Secondary | ICD-10-CM | POA: Diagnosis not present

## 2016-09-14 DIAGNOSIS — M6281 Muscle weakness (generalized): Secondary | ICD-10-CM | POA: Diagnosis not present

## 2016-09-14 DIAGNOSIS — E119 Type 2 diabetes mellitus without complications: Secondary | ICD-10-CM | POA: Diagnosis not present

## 2016-09-14 DIAGNOSIS — Z7984 Long term (current) use of oral hypoglycemic drugs: Secondary | ICD-10-CM | POA: Diagnosis not present

## 2016-09-14 DIAGNOSIS — I10 Essential (primary) hypertension: Secondary | ICD-10-CM | POA: Diagnosis not present

## 2016-09-15 DIAGNOSIS — M6281 Muscle weakness (generalized): Secondary | ICD-10-CM | POA: Diagnosis not present

## 2016-09-15 DIAGNOSIS — Z4789 Encounter for other orthopedic aftercare: Secondary | ICD-10-CM | POA: Diagnosis not present

## 2016-09-15 DIAGNOSIS — I10 Essential (primary) hypertension: Secondary | ICD-10-CM | POA: Diagnosis not present

## 2016-09-15 DIAGNOSIS — R2689 Other abnormalities of gait and mobility: Secondary | ICD-10-CM | POA: Diagnosis not present

## 2016-09-15 DIAGNOSIS — E119 Type 2 diabetes mellitus without complications: Secondary | ICD-10-CM | POA: Diagnosis not present

## 2016-09-15 DIAGNOSIS — Z7984 Long term (current) use of oral hypoglycemic drugs: Secondary | ICD-10-CM | POA: Diagnosis not present

## 2016-09-18 HISTORY — PX: BACK SURGERY: SHX140

## 2016-09-19 DIAGNOSIS — M419 Scoliosis, unspecified: Secondary | ICD-10-CM | POA: Diagnosis not present

## 2016-09-19 DIAGNOSIS — M5106 Intervertebral disc disorders with myelopathy, lumbar region: Secondary | ICD-10-CM | POA: Diagnosis not present

## 2016-09-19 DIAGNOSIS — M47814 Spondylosis without myelopathy or radiculopathy, thoracic region: Secondary | ICD-10-CM | POA: Diagnosis not present

## 2016-09-19 DIAGNOSIS — M47815 Spondylosis without myelopathy or radiculopathy, thoracolumbar region: Secondary | ICD-10-CM | POA: Diagnosis not present

## 2016-09-19 DIAGNOSIS — M545 Low back pain: Secondary | ICD-10-CM | POA: Diagnosis not present

## 2016-09-19 DIAGNOSIS — Z981 Arthrodesis status: Secondary | ICD-10-CM | POA: Diagnosis not present

## 2016-09-20 ENCOUNTER — Ambulatory Visit (HOSPITAL_COMMUNITY): Admit: 2016-09-20 | Payer: Medicare Other | Admitting: Internal Medicine

## 2016-09-20 ENCOUNTER — Encounter (HOSPITAL_COMMUNITY): Payer: Self-pay

## 2016-09-20 DIAGNOSIS — R2689 Other abnormalities of gait and mobility: Secondary | ICD-10-CM | POA: Diagnosis not present

## 2016-09-20 DIAGNOSIS — I10 Essential (primary) hypertension: Secondary | ICD-10-CM | POA: Diagnosis not present

## 2016-09-20 DIAGNOSIS — M6281 Muscle weakness (generalized): Secondary | ICD-10-CM | POA: Diagnosis not present

## 2016-09-20 DIAGNOSIS — Z7984 Long term (current) use of oral hypoglycemic drugs: Secondary | ICD-10-CM | POA: Diagnosis not present

## 2016-09-20 DIAGNOSIS — Z4789 Encounter for other orthopedic aftercare: Secondary | ICD-10-CM | POA: Diagnosis not present

## 2016-09-20 DIAGNOSIS — E119 Type 2 diabetes mellitus without complications: Secondary | ICD-10-CM | POA: Diagnosis not present

## 2016-09-20 SURGERY — COLONOSCOPY
Anesthesia: Moderate Sedation

## 2016-09-21 DIAGNOSIS — E119 Type 2 diabetes mellitus without complications: Secondary | ICD-10-CM | POA: Diagnosis not present

## 2016-09-21 DIAGNOSIS — Z7984 Long term (current) use of oral hypoglycemic drugs: Secondary | ICD-10-CM | POA: Diagnosis not present

## 2016-09-21 DIAGNOSIS — Z4789 Encounter for other orthopedic aftercare: Secondary | ICD-10-CM | POA: Diagnosis not present

## 2016-09-21 DIAGNOSIS — M6281 Muscle weakness (generalized): Secondary | ICD-10-CM | POA: Diagnosis not present

## 2016-09-21 DIAGNOSIS — I10 Essential (primary) hypertension: Secondary | ICD-10-CM | POA: Diagnosis not present

## 2016-09-21 DIAGNOSIS — R2689 Other abnormalities of gait and mobility: Secondary | ICD-10-CM | POA: Diagnosis not present

## 2016-09-25 DIAGNOSIS — M4326 Fusion of spine, lumbar region: Secondary | ICD-10-CM | POA: Diagnosis not present

## 2016-09-25 DIAGNOSIS — Z981 Arthrodesis status: Secondary | ICD-10-CM | POA: Diagnosis not present

## 2016-09-25 DIAGNOSIS — M5106 Intervertebral disc disorders with myelopathy, lumbar region: Secondary | ICD-10-CM | POA: Diagnosis not present

## 2016-09-25 DIAGNOSIS — R937 Abnormal findings on diagnostic imaging of other parts of musculoskeletal system: Secondary | ICD-10-CM | POA: Diagnosis not present

## 2016-09-28 DIAGNOSIS — M4807 Spinal stenosis, lumbosacral region: Secondary | ICD-10-CM | POA: Diagnosis not present

## 2016-09-28 DIAGNOSIS — Z6841 Body Mass Index (BMI) 40.0 and over, adult: Secondary | ICD-10-CM | POA: Diagnosis not present

## 2016-09-29 DIAGNOSIS — R2689 Other abnormalities of gait and mobility: Secondary | ICD-10-CM | POA: Diagnosis not present

## 2016-09-29 DIAGNOSIS — M6281 Muscle weakness (generalized): Secondary | ICD-10-CM | POA: Diagnosis not present

## 2016-09-29 DIAGNOSIS — Z7984 Long term (current) use of oral hypoglycemic drugs: Secondary | ICD-10-CM | POA: Diagnosis not present

## 2016-09-29 DIAGNOSIS — E119 Type 2 diabetes mellitus without complications: Secondary | ICD-10-CM | POA: Diagnosis not present

## 2016-09-29 DIAGNOSIS — Z4789 Encounter for other orthopedic aftercare: Secondary | ICD-10-CM | POA: Diagnosis not present

## 2016-09-29 DIAGNOSIS — I10 Essential (primary) hypertension: Secondary | ICD-10-CM | POA: Diagnosis not present

## 2016-10-02 DIAGNOSIS — R52 Pain, unspecified: Secondary | ICD-10-CM | POA: Diagnosis not present

## 2016-10-02 DIAGNOSIS — Z79899 Other long term (current) drug therapy: Secondary | ICD-10-CM | POA: Diagnosis not present

## 2016-10-02 DIAGNOSIS — K5900 Constipation, unspecified: Secondary | ICD-10-CM | POA: Diagnosis not present

## 2016-10-02 DIAGNOSIS — E7800 Pure hypercholesterolemia, unspecified: Secondary | ICD-10-CM | POA: Diagnosis present

## 2016-10-02 DIAGNOSIS — M5106 Intervertebral disc disorders with myelopathy, lumbar region: Secondary | ICD-10-CM | POA: Diagnosis not present

## 2016-10-02 DIAGNOSIS — M6281 Muscle weakness (generalized): Secondary | ICD-10-CM | POA: Diagnosis not present

## 2016-10-02 DIAGNOSIS — M47896 Other spondylosis, lumbar region: Secondary | ICD-10-CM | POA: Diagnosis not present

## 2016-10-02 DIAGNOSIS — E119 Type 2 diabetes mellitus without complications: Secondary | ICD-10-CM | POA: Diagnosis present

## 2016-10-02 DIAGNOSIS — Y793 Surgical instruments, materials and orthopedic devices (including sutures) associated with adverse incidents: Secondary | ICD-10-CM | POA: Diagnosis not present

## 2016-10-02 DIAGNOSIS — E669 Obesity, unspecified: Secondary | ICD-10-CM | POA: Diagnosis present

## 2016-10-02 DIAGNOSIS — M47816 Spondylosis without myelopathy or radiculopathy, lumbar region: Secondary | ICD-10-CM | POA: Diagnosis not present

## 2016-10-02 DIAGNOSIS — Z4789 Encounter for other orthopedic aftercare: Secondary | ICD-10-CM | POA: Diagnosis not present

## 2016-10-02 DIAGNOSIS — M4186 Other forms of scoliosis, lumbar region: Secondary | ICD-10-CM | POA: Diagnosis not present

## 2016-10-02 DIAGNOSIS — Z6835 Body mass index (BMI) 35.0-35.9, adult: Secondary | ICD-10-CM | POA: Diagnosis not present

## 2016-10-02 DIAGNOSIS — E039 Hypothyroidism, unspecified: Secondary | ICD-10-CM | POA: Diagnosis present

## 2016-10-02 DIAGNOSIS — Z823 Family history of stroke: Secondary | ICD-10-CM | POA: Diagnosis not present

## 2016-10-02 DIAGNOSIS — M5126 Other intervertebral disc displacement, lumbar region: Secondary | ICD-10-CM | POA: Diagnosis not present

## 2016-10-02 DIAGNOSIS — I10 Essential (primary) hypertension: Secondary | ICD-10-CM | POA: Diagnosis present

## 2016-10-02 DIAGNOSIS — Z8249 Family history of ischemic heart disease and other diseases of the circulatory system: Secondary | ICD-10-CM | POA: Diagnosis not present

## 2016-10-02 DIAGNOSIS — Z7984 Long term (current) use of oral hypoglycemic drugs: Secondary | ICD-10-CM | POA: Diagnosis not present

## 2016-10-02 DIAGNOSIS — M109 Gout, unspecified: Secondary | ICD-10-CM | POA: Diagnosis present

## 2016-10-02 DIAGNOSIS — R2689 Other abnormalities of gait and mobility: Secondary | ICD-10-CM | POA: Diagnosis not present

## 2016-10-02 DIAGNOSIS — M419 Scoliosis, unspecified: Secondary | ICD-10-CM | POA: Diagnosis present

## 2016-10-02 DIAGNOSIS — T84226A Displacement of internal fixation device of vertebrae, initial encounter: Secondary | ICD-10-CM | POA: Diagnosis present

## 2016-10-02 DIAGNOSIS — K219 Gastro-esophageal reflux disease without esophagitis: Secondary | ICD-10-CM | POA: Diagnosis present

## 2016-10-02 DIAGNOSIS — Z981 Arthrodesis status: Secondary | ICD-10-CM | POA: Diagnosis not present

## 2016-10-02 DIAGNOSIS — Z79891 Long term (current) use of opiate analgesic: Secondary | ICD-10-CM | POA: Diagnosis not present

## 2016-10-06 DIAGNOSIS — I10 Essential (primary) hypertension: Secondary | ICD-10-CM | POA: Diagnosis not present

## 2016-10-06 DIAGNOSIS — Z4789 Encounter for other orthopedic aftercare: Secondary | ICD-10-CM | POA: Diagnosis not present

## 2016-10-06 DIAGNOSIS — M109 Gout, unspecified: Secondary | ICD-10-CM | POA: Diagnosis not present

## 2016-10-06 DIAGNOSIS — M6281 Muscle weakness (generalized): Secondary | ICD-10-CM | POA: Diagnosis not present

## 2016-10-06 DIAGNOSIS — E119 Type 2 diabetes mellitus without complications: Secondary | ICD-10-CM | POA: Diagnosis not present

## 2016-10-06 DIAGNOSIS — Z981 Arthrodesis status: Secondary | ICD-10-CM | POA: Diagnosis not present

## 2016-10-06 DIAGNOSIS — K219 Gastro-esophageal reflux disease without esophagitis: Secondary | ICD-10-CM | POA: Diagnosis not present

## 2016-10-06 DIAGNOSIS — M47896 Other spondylosis, lumbar region: Secondary | ICD-10-CM | POA: Diagnosis not present

## 2016-10-06 DIAGNOSIS — K5900 Constipation, unspecified: Secondary | ICD-10-CM | POA: Diagnosis not present

## 2016-10-06 DIAGNOSIS — E039 Hypothyroidism, unspecified: Secondary | ICD-10-CM | POA: Diagnosis not present

## 2016-10-06 DIAGNOSIS — R2689 Other abnormalities of gait and mobility: Secondary | ICD-10-CM | POA: Diagnosis not present

## 2016-10-06 DIAGNOSIS — R52 Pain, unspecified: Secondary | ICD-10-CM | POA: Diagnosis not present

## 2016-10-17 DIAGNOSIS — M5126 Other intervertebral disc displacement, lumbar region: Secondary | ICD-10-CM | POA: Diagnosis not present

## 2016-10-17 DIAGNOSIS — M5106 Intervertebral disc disorders with myelopathy, lumbar region: Secondary | ICD-10-CM | POA: Diagnosis not present

## 2016-10-17 DIAGNOSIS — Z981 Arthrodesis status: Secondary | ICD-10-CM | POA: Diagnosis not present

## 2016-10-18 DIAGNOSIS — I10 Essential (primary) hypertension: Secondary | ICD-10-CM | POA: Diagnosis not present

## 2016-10-18 DIAGNOSIS — M6281 Muscle weakness (generalized): Secondary | ICD-10-CM | POA: Diagnosis not present

## 2016-10-18 DIAGNOSIS — Z4789 Encounter for other orthopedic aftercare: Secondary | ICD-10-CM | POA: Diagnosis not present

## 2016-10-18 DIAGNOSIS — R2689 Other abnormalities of gait and mobility: Secondary | ICD-10-CM | POA: Diagnosis not present

## 2016-10-18 DIAGNOSIS — E119 Type 2 diabetes mellitus without complications: Secondary | ICD-10-CM | POA: Diagnosis not present

## 2016-10-18 DIAGNOSIS — Z7984 Long term (current) use of oral hypoglycemic drugs: Secondary | ICD-10-CM | POA: Diagnosis not present

## 2016-10-20 DIAGNOSIS — I10 Essential (primary) hypertension: Secondary | ICD-10-CM | POA: Diagnosis not present

## 2016-10-20 DIAGNOSIS — E119 Type 2 diabetes mellitus without complications: Secondary | ICD-10-CM | POA: Diagnosis not present

## 2016-10-20 DIAGNOSIS — Z7984 Long term (current) use of oral hypoglycemic drugs: Secondary | ICD-10-CM | POA: Diagnosis not present

## 2016-10-20 DIAGNOSIS — R2689 Other abnormalities of gait and mobility: Secondary | ICD-10-CM | POA: Diagnosis not present

## 2016-10-20 DIAGNOSIS — M6281 Muscle weakness (generalized): Secondary | ICD-10-CM | POA: Diagnosis not present

## 2016-10-20 DIAGNOSIS — Z4789 Encounter for other orthopedic aftercare: Secondary | ICD-10-CM | POA: Diagnosis not present

## 2016-10-26 DIAGNOSIS — R2689 Other abnormalities of gait and mobility: Secondary | ICD-10-CM | POA: Diagnosis not present

## 2016-10-26 DIAGNOSIS — E119 Type 2 diabetes mellitus without complications: Secondary | ICD-10-CM | POA: Diagnosis not present

## 2016-10-26 DIAGNOSIS — I10 Essential (primary) hypertension: Secondary | ICD-10-CM | POA: Diagnosis not present

## 2016-10-26 DIAGNOSIS — M6281 Muscle weakness (generalized): Secondary | ICD-10-CM | POA: Diagnosis not present

## 2016-10-26 DIAGNOSIS — Z7984 Long term (current) use of oral hypoglycemic drugs: Secondary | ICD-10-CM | POA: Diagnosis not present

## 2016-10-26 DIAGNOSIS — Z4789 Encounter for other orthopedic aftercare: Secondary | ICD-10-CM | POA: Diagnosis not present

## 2016-10-27 DIAGNOSIS — I10 Essential (primary) hypertension: Secondary | ICD-10-CM | POA: Diagnosis not present

## 2016-10-27 DIAGNOSIS — M6281 Muscle weakness (generalized): Secondary | ICD-10-CM | POA: Diagnosis not present

## 2016-10-27 DIAGNOSIS — Z4789 Encounter for other orthopedic aftercare: Secondary | ICD-10-CM | POA: Diagnosis not present

## 2016-10-27 DIAGNOSIS — R2689 Other abnormalities of gait and mobility: Secondary | ICD-10-CM | POA: Diagnosis not present

## 2016-10-27 DIAGNOSIS — E119 Type 2 diabetes mellitus without complications: Secondary | ICD-10-CM | POA: Diagnosis not present

## 2016-10-27 DIAGNOSIS — Z7984 Long term (current) use of oral hypoglycemic drugs: Secondary | ICD-10-CM | POA: Diagnosis not present

## 2016-10-31 DIAGNOSIS — I10 Essential (primary) hypertension: Secondary | ICD-10-CM | POA: Diagnosis not present

## 2016-10-31 DIAGNOSIS — R2689 Other abnormalities of gait and mobility: Secondary | ICD-10-CM | POA: Diagnosis not present

## 2016-10-31 DIAGNOSIS — E119 Type 2 diabetes mellitus without complications: Secondary | ICD-10-CM | POA: Diagnosis not present

## 2016-10-31 DIAGNOSIS — Z4789 Encounter for other orthopedic aftercare: Secondary | ICD-10-CM | POA: Diagnosis not present

## 2016-10-31 DIAGNOSIS — M6281 Muscle weakness (generalized): Secondary | ICD-10-CM | POA: Diagnosis not present

## 2016-10-31 DIAGNOSIS — Z981 Arthrodesis status: Secondary | ICD-10-CM | POA: Diagnosis not present

## 2016-11-02 DIAGNOSIS — Z4789 Encounter for other orthopedic aftercare: Secondary | ICD-10-CM | POA: Diagnosis not present

## 2016-11-02 DIAGNOSIS — Z981 Arthrodesis status: Secondary | ICD-10-CM | POA: Diagnosis not present

## 2016-11-02 DIAGNOSIS — M6281 Muscle weakness (generalized): Secondary | ICD-10-CM | POA: Diagnosis not present

## 2016-11-02 DIAGNOSIS — I10 Essential (primary) hypertension: Secondary | ICD-10-CM | POA: Diagnosis not present

## 2016-11-02 DIAGNOSIS — E119 Type 2 diabetes mellitus without complications: Secondary | ICD-10-CM | POA: Diagnosis not present

## 2016-11-02 DIAGNOSIS — R2689 Other abnormalities of gait and mobility: Secondary | ICD-10-CM | POA: Diagnosis not present

## 2016-11-08 DIAGNOSIS — E119 Type 2 diabetes mellitus without complications: Secondary | ICD-10-CM | POA: Diagnosis not present

## 2016-11-08 DIAGNOSIS — R2689 Other abnormalities of gait and mobility: Secondary | ICD-10-CM | POA: Diagnosis not present

## 2016-11-08 DIAGNOSIS — I10 Essential (primary) hypertension: Secondary | ICD-10-CM | POA: Diagnosis not present

## 2016-11-08 DIAGNOSIS — M6281 Muscle weakness (generalized): Secondary | ICD-10-CM | POA: Diagnosis not present

## 2016-11-08 DIAGNOSIS — Z981 Arthrodesis status: Secondary | ICD-10-CM | POA: Diagnosis not present

## 2016-11-08 DIAGNOSIS — Z4789 Encounter for other orthopedic aftercare: Secondary | ICD-10-CM | POA: Diagnosis not present

## 2016-11-10 DIAGNOSIS — R2689 Other abnormalities of gait and mobility: Secondary | ICD-10-CM | POA: Diagnosis not present

## 2016-11-10 DIAGNOSIS — M6281 Muscle weakness (generalized): Secondary | ICD-10-CM | POA: Diagnosis not present

## 2016-11-10 DIAGNOSIS — I10 Essential (primary) hypertension: Secondary | ICD-10-CM | POA: Diagnosis not present

## 2016-11-10 DIAGNOSIS — Z4789 Encounter for other orthopedic aftercare: Secondary | ICD-10-CM | POA: Diagnosis not present

## 2016-11-10 DIAGNOSIS — E119 Type 2 diabetes mellitus without complications: Secondary | ICD-10-CM | POA: Diagnosis not present

## 2016-11-10 DIAGNOSIS — Z981 Arthrodesis status: Secondary | ICD-10-CM | POA: Diagnosis not present

## 2016-11-15 DIAGNOSIS — I10 Essential (primary) hypertension: Secondary | ICD-10-CM | POA: Diagnosis not present

## 2016-11-15 DIAGNOSIS — Z981 Arthrodesis status: Secondary | ICD-10-CM | POA: Diagnosis not present

## 2016-11-15 DIAGNOSIS — R2689 Other abnormalities of gait and mobility: Secondary | ICD-10-CM | POA: Diagnosis not present

## 2016-11-15 DIAGNOSIS — E119 Type 2 diabetes mellitus without complications: Secondary | ICD-10-CM | POA: Diagnosis not present

## 2016-11-15 DIAGNOSIS — Z4789 Encounter for other orthopedic aftercare: Secondary | ICD-10-CM | POA: Diagnosis not present

## 2016-11-15 DIAGNOSIS — M6281 Muscle weakness (generalized): Secondary | ICD-10-CM | POA: Diagnosis not present

## 2016-11-16 DIAGNOSIS — R2689 Other abnormalities of gait and mobility: Secondary | ICD-10-CM | POA: Diagnosis not present

## 2016-11-16 DIAGNOSIS — Z4789 Encounter for other orthopedic aftercare: Secondary | ICD-10-CM | POA: Diagnosis not present

## 2016-11-16 DIAGNOSIS — E119 Type 2 diabetes mellitus without complications: Secondary | ICD-10-CM | POA: Diagnosis not present

## 2016-11-16 DIAGNOSIS — Z981 Arthrodesis status: Secondary | ICD-10-CM | POA: Diagnosis not present

## 2016-11-16 DIAGNOSIS — M6281 Muscle weakness (generalized): Secondary | ICD-10-CM | POA: Diagnosis not present

## 2016-11-16 DIAGNOSIS — I10 Essential (primary) hypertension: Secondary | ICD-10-CM | POA: Diagnosis not present

## 2016-11-22 DIAGNOSIS — Z4789 Encounter for other orthopedic aftercare: Secondary | ICD-10-CM | POA: Diagnosis not present

## 2016-11-22 DIAGNOSIS — Z981 Arthrodesis status: Secondary | ICD-10-CM | POA: Diagnosis not present

## 2016-11-22 DIAGNOSIS — R2689 Other abnormalities of gait and mobility: Secondary | ICD-10-CM | POA: Diagnosis not present

## 2016-11-22 DIAGNOSIS — E119 Type 2 diabetes mellitus without complications: Secondary | ICD-10-CM | POA: Diagnosis not present

## 2016-11-22 DIAGNOSIS — M6281 Muscle weakness (generalized): Secondary | ICD-10-CM | POA: Diagnosis not present

## 2016-11-22 DIAGNOSIS — I10 Essential (primary) hypertension: Secondary | ICD-10-CM | POA: Diagnosis not present

## 2016-11-24 DIAGNOSIS — I10 Essential (primary) hypertension: Secondary | ICD-10-CM | POA: Diagnosis not present

## 2016-11-24 DIAGNOSIS — Z981 Arthrodesis status: Secondary | ICD-10-CM | POA: Diagnosis not present

## 2016-11-24 DIAGNOSIS — R2689 Other abnormalities of gait and mobility: Secondary | ICD-10-CM | POA: Diagnosis not present

## 2016-11-24 DIAGNOSIS — M6281 Muscle weakness (generalized): Secondary | ICD-10-CM | POA: Diagnosis not present

## 2016-11-24 DIAGNOSIS — Z4789 Encounter for other orthopedic aftercare: Secondary | ICD-10-CM | POA: Diagnosis not present

## 2016-11-24 DIAGNOSIS — E119 Type 2 diabetes mellitus without complications: Secondary | ICD-10-CM | POA: Diagnosis not present

## 2016-11-29 DIAGNOSIS — I10 Essential (primary) hypertension: Secondary | ICD-10-CM | POA: Diagnosis not present

## 2016-11-29 DIAGNOSIS — Z4789 Encounter for other orthopedic aftercare: Secondary | ICD-10-CM | POA: Diagnosis not present

## 2016-11-29 DIAGNOSIS — Z981 Arthrodesis status: Secondary | ICD-10-CM | POA: Diagnosis not present

## 2016-11-29 DIAGNOSIS — E119 Type 2 diabetes mellitus without complications: Secondary | ICD-10-CM | POA: Diagnosis not present

## 2016-11-29 DIAGNOSIS — R2689 Other abnormalities of gait and mobility: Secondary | ICD-10-CM | POA: Diagnosis not present

## 2016-11-29 DIAGNOSIS — M6281 Muscle weakness (generalized): Secondary | ICD-10-CM | POA: Diagnosis not present

## 2016-12-01 DIAGNOSIS — M6281 Muscle weakness (generalized): Secondary | ICD-10-CM | POA: Diagnosis not present

## 2016-12-01 DIAGNOSIS — R2689 Other abnormalities of gait and mobility: Secondary | ICD-10-CM | POA: Diagnosis not present

## 2016-12-01 DIAGNOSIS — Z981 Arthrodesis status: Secondary | ICD-10-CM | POA: Diagnosis not present

## 2016-12-01 DIAGNOSIS — Z4789 Encounter for other orthopedic aftercare: Secondary | ICD-10-CM | POA: Diagnosis not present

## 2016-12-01 DIAGNOSIS — I10 Essential (primary) hypertension: Secondary | ICD-10-CM | POA: Diagnosis not present

## 2016-12-01 DIAGNOSIS — E119 Type 2 diabetes mellitus without complications: Secondary | ICD-10-CM | POA: Diagnosis not present

## 2016-12-06 DIAGNOSIS — R2689 Other abnormalities of gait and mobility: Secondary | ICD-10-CM | POA: Diagnosis not present

## 2016-12-06 DIAGNOSIS — Z981 Arthrodesis status: Secondary | ICD-10-CM | POA: Diagnosis not present

## 2016-12-06 DIAGNOSIS — E119 Type 2 diabetes mellitus without complications: Secondary | ICD-10-CM | POA: Diagnosis not present

## 2016-12-06 DIAGNOSIS — I10 Essential (primary) hypertension: Secondary | ICD-10-CM | POA: Diagnosis not present

## 2016-12-06 DIAGNOSIS — M6281 Muscle weakness (generalized): Secondary | ICD-10-CM | POA: Diagnosis not present

## 2016-12-06 DIAGNOSIS — Z4789 Encounter for other orthopedic aftercare: Secondary | ICD-10-CM | POA: Diagnosis not present

## 2016-12-08 DIAGNOSIS — I10 Essential (primary) hypertension: Secondary | ICD-10-CM | POA: Diagnosis not present

## 2016-12-08 DIAGNOSIS — Z981 Arthrodesis status: Secondary | ICD-10-CM | POA: Diagnosis not present

## 2016-12-08 DIAGNOSIS — Z4789 Encounter for other orthopedic aftercare: Secondary | ICD-10-CM | POA: Diagnosis not present

## 2016-12-08 DIAGNOSIS — E119 Type 2 diabetes mellitus without complications: Secondary | ICD-10-CM | POA: Diagnosis not present

## 2016-12-08 DIAGNOSIS — M6281 Muscle weakness (generalized): Secondary | ICD-10-CM | POA: Diagnosis not present

## 2016-12-08 DIAGNOSIS — R2689 Other abnormalities of gait and mobility: Secondary | ICD-10-CM | POA: Diagnosis not present

## 2016-12-13 DIAGNOSIS — M6281 Muscle weakness (generalized): Secondary | ICD-10-CM | POA: Diagnosis not present

## 2016-12-13 DIAGNOSIS — E119 Type 2 diabetes mellitus without complications: Secondary | ICD-10-CM | POA: Diagnosis not present

## 2016-12-13 DIAGNOSIS — Z4789 Encounter for other orthopedic aftercare: Secondary | ICD-10-CM | POA: Diagnosis not present

## 2016-12-13 DIAGNOSIS — R2689 Other abnormalities of gait and mobility: Secondary | ICD-10-CM | POA: Diagnosis not present

## 2016-12-13 DIAGNOSIS — Z981 Arthrodesis status: Secondary | ICD-10-CM | POA: Diagnosis not present

## 2016-12-13 DIAGNOSIS — I10 Essential (primary) hypertension: Secondary | ICD-10-CM | POA: Diagnosis not present

## 2016-12-15 DIAGNOSIS — Z4789 Encounter for other orthopedic aftercare: Secondary | ICD-10-CM | POA: Diagnosis not present

## 2016-12-15 DIAGNOSIS — M6281 Muscle weakness (generalized): Secondary | ICD-10-CM | POA: Diagnosis not present

## 2016-12-15 DIAGNOSIS — I10 Essential (primary) hypertension: Secondary | ICD-10-CM | POA: Diagnosis not present

## 2016-12-15 DIAGNOSIS — Z981 Arthrodesis status: Secondary | ICD-10-CM | POA: Diagnosis not present

## 2016-12-15 DIAGNOSIS — E119 Type 2 diabetes mellitus without complications: Secondary | ICD-10-CM | POA: Diagnosis not present

## 2016-12-15 DIAGNOSIS — R2689 Other abnormalities of gait and mobility: Secondary | ICD-10-CM | POA: Diagnosis not present

## 2017-03-06 DIAGNOSIS — M5106 Intervertebral disc disorders with myelopathy, lumbar region: Secondary | ICD-10-CM | POA: Diagnosis not present

## 2017-03-06 DIAGNOSIS — M4150 Other secondary scoliosis, site unspecified: Secondary | ICD-10-CM | POA: Diagnosis not present

## 2017-03-09 DIAGNOSIS — Z1231 Encounter for screening mammogram for malignant neoplasm of breast: Secondary | ICD-10-CM | POA: Diagnosis not present

## 2017-03-29 ENCOUNTER — Emergency Department (HOSPITAL_COMMUNITY): Payer: Medicare Other

## 2017-03-29 ENCOUNTER — Encounter (HOSPITAL_COMMUNITY): Payer: Self-pay | Admitting: Emergency Medicine

## 2017-03-29 ENCOUNTER — Emergency Department (HOSPITAL_COMMUNITY)
Admission: EM | Admit: 2017-03-29 | Discharge: 2017-03-29 | Disposition: A | Discharge status: Home / Self Care | Payer: Medicare Other | Attending: Emergency Medicine | Admitting: Emergency Medicine

## 2017-03-29 DIAGNOSIS — Z7984 Long term (current) use of oral hypoglycemic drugs: Secondary | ICD-10-CM | POA: Insufficient documentation

## 2017-03-29 DIAGNOSIS — E039 Hypothyroidism, unspecified: Secondary | ICD-10-CM | POA: Diagnosis not present

## 2017-03-29 DIAGNOSIS — I10 Essential (primary) hypertension: Secondary | ICD-10-CM | POA: Insufficient documentation

## 2017-03-29 DIAGNOSIS — Z79899 Other long term (current) drug therapy: Secondary | ICD-10-CM | POA: Insufficient documentation

## 2017-03-29 DIAGNOSIS — R0789 Other chest pain: Secondary | ICD-10-CM

## 2017-03-29 DIAGNOSIS — R079 Chest pain, unspecified: Secondary | ICD-10-CM | POA: Diagnosis not present

## 2017-03-29 DIAGNOSIS — Z791 Long term (current) use of non-steroidal anti-inflammatories (NSAID): Secondary | ICD-10-CM | POA: Diagnosis not present

## 2017-03-29 LAB — I-STAT TROPONIN, ED
TROPONIN I, POC: 0 ng/mL (ref 0.00–0.08)
TROPONIN I, POC: 0 ng/mL (ref 0.00–0.08)

## 2017-03-29 LAB — BASIC METABOLIC PANEL
Anion gap: 9 (ref 5–15)
BUN: 24 mg/dL — AB (ref 6–20)
CALCIUM: 9.7 mg/dL (ref 8.9–10.3)
CHLORIDE: 106 mmol/L (ref 101–111)
CO2: 26 mmol/L (ref 22–32)
CREATININE: 1.1 mg/dL — AB (ref 0.44–1.00)
GFR calc non Af Amer: 50 mL/min — ABNORMAL LOW (ref >60)
GFR, EST AFRICAN AMERICAN: 58 mL/min — AB (ref >60)
Glucose, Bld: 104 mg/dL — ABNORMAL HIGH (ref 65–99)
Potassium: 4.2 mmol/L (ref 3.5–5.1)
SODIUM: 141 mmol/L (ref 135–145)

## 2017-03-29 LAB — CBC WITH DIFFERENTIAL/PLATELET
BASOS PCT: 0 %
Basophils Absolute: 0 10*3/uL (ref 0.0–0.1)
EOS ABS: 0.2 10*3/uL (ref 0.0–0.7)
EOS PCT: 2 %
HCT: 41.3 % (ref 36.0–46.0)
Hemoglobin: 13.4 g/dL (ref 12.0–15.0)
LYMPHS ABS: 2.6 10*3/uL (ref 0.7–4.0)
Lymphocytes Relative: 33 %
MCH: 28.5 pg (ref 26.0–34.0)
MCHC: 32.4 g/dL (ref 30.0–36.0)
MCV: 87.7 fL (ref 78.0–100.0)
MONOS PCT: 11 %
Monocytes Absolute: 0.8 10*3/uL (ref 0.1–1.0)
Neutro Abs: 4.2 10*3/uL (ref 1.7–7.7)
Neutrophils Relative %: 54 %
PLATELETS: 270 10*3/uL (ref 150–400)
RBC: 4.71 MIL/uL (ref 3.87–5.11)
RDW: 15.4 % (ref 11.5–15.5)
WBC: 7.9 10*3/uL (ref 4.0–10.5)

## 2017-03-29 IMAGING — DX DG CHEST 2V
2 series · 2 of 2 positions shown · non-contrast
Comparison: Radiograph [DATE]

CLINICAL DATA: Chest pain.

EXAM:
CHEST  2 VIEW

[chest pa]
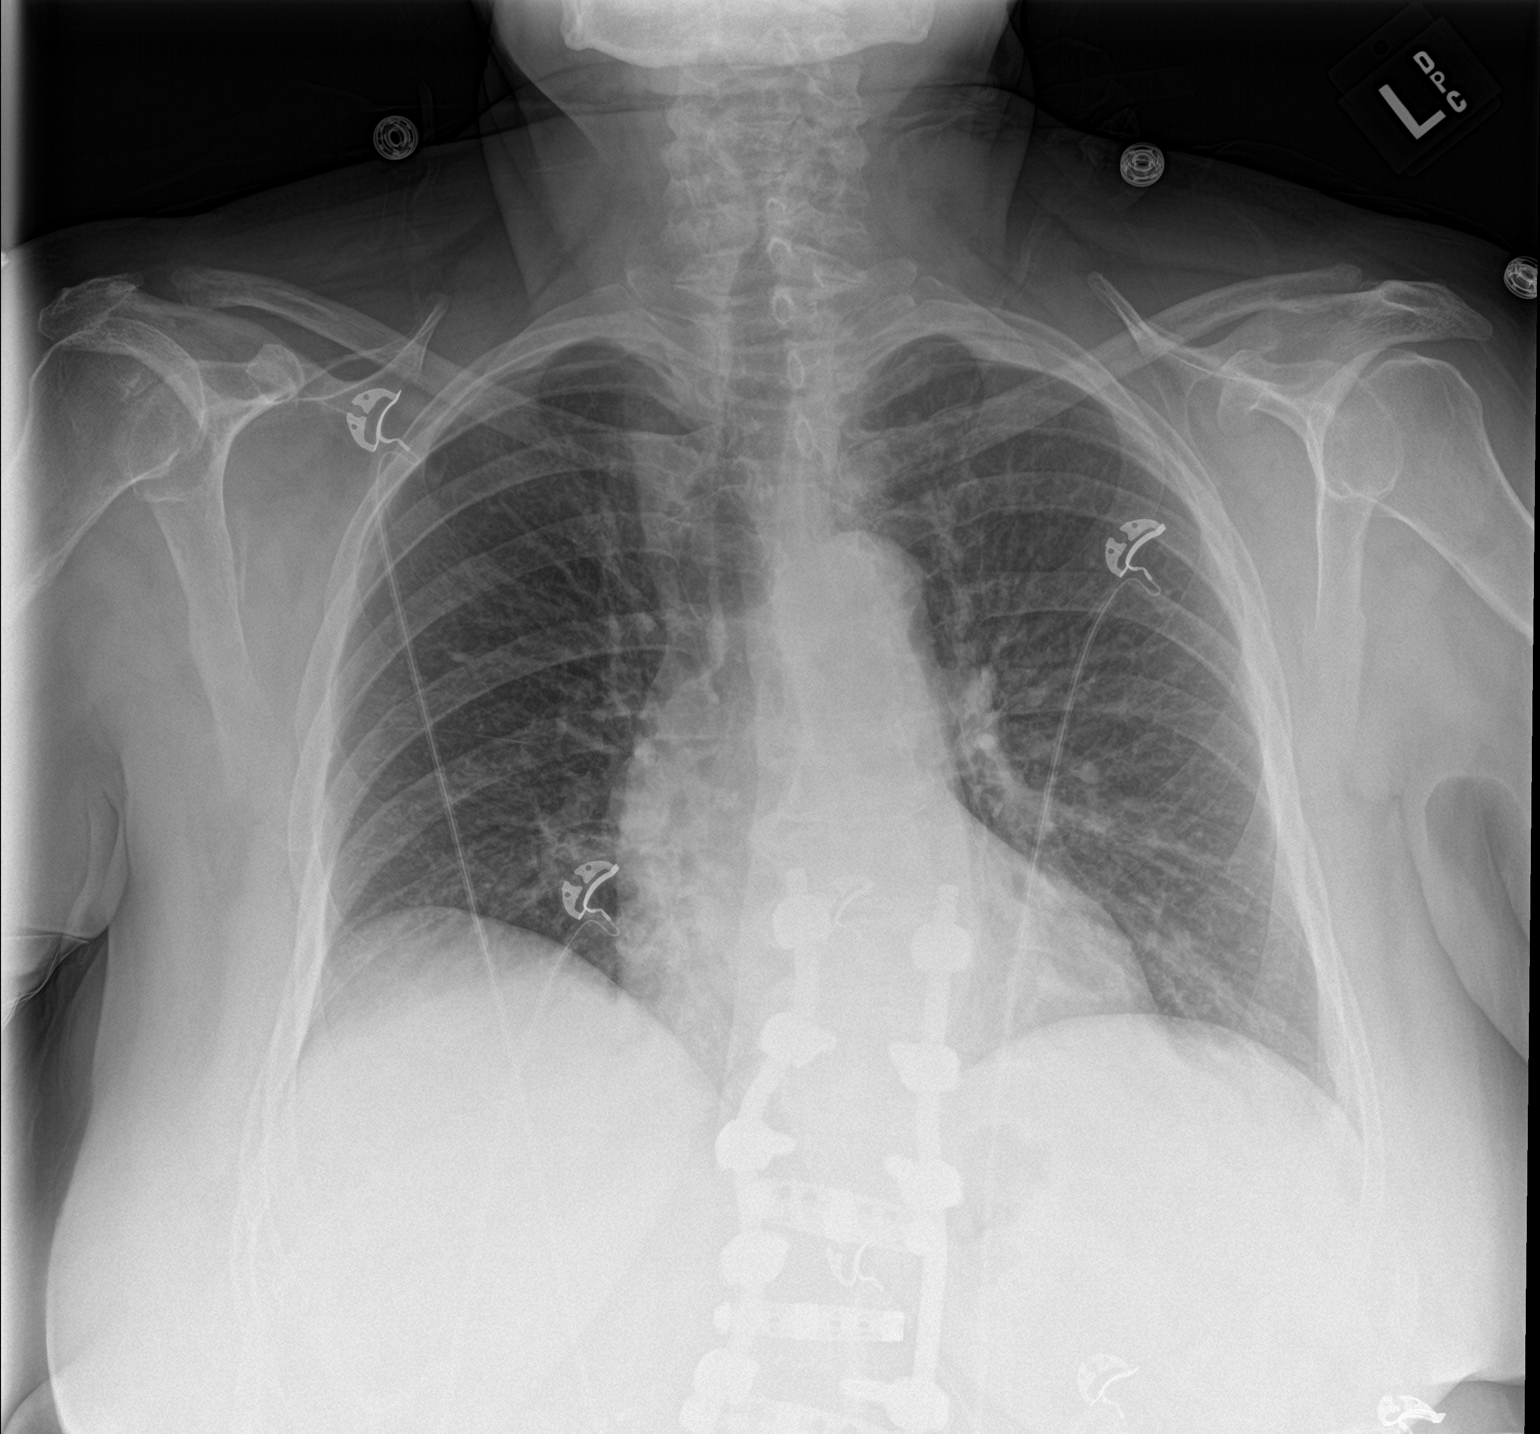

[chest lat]
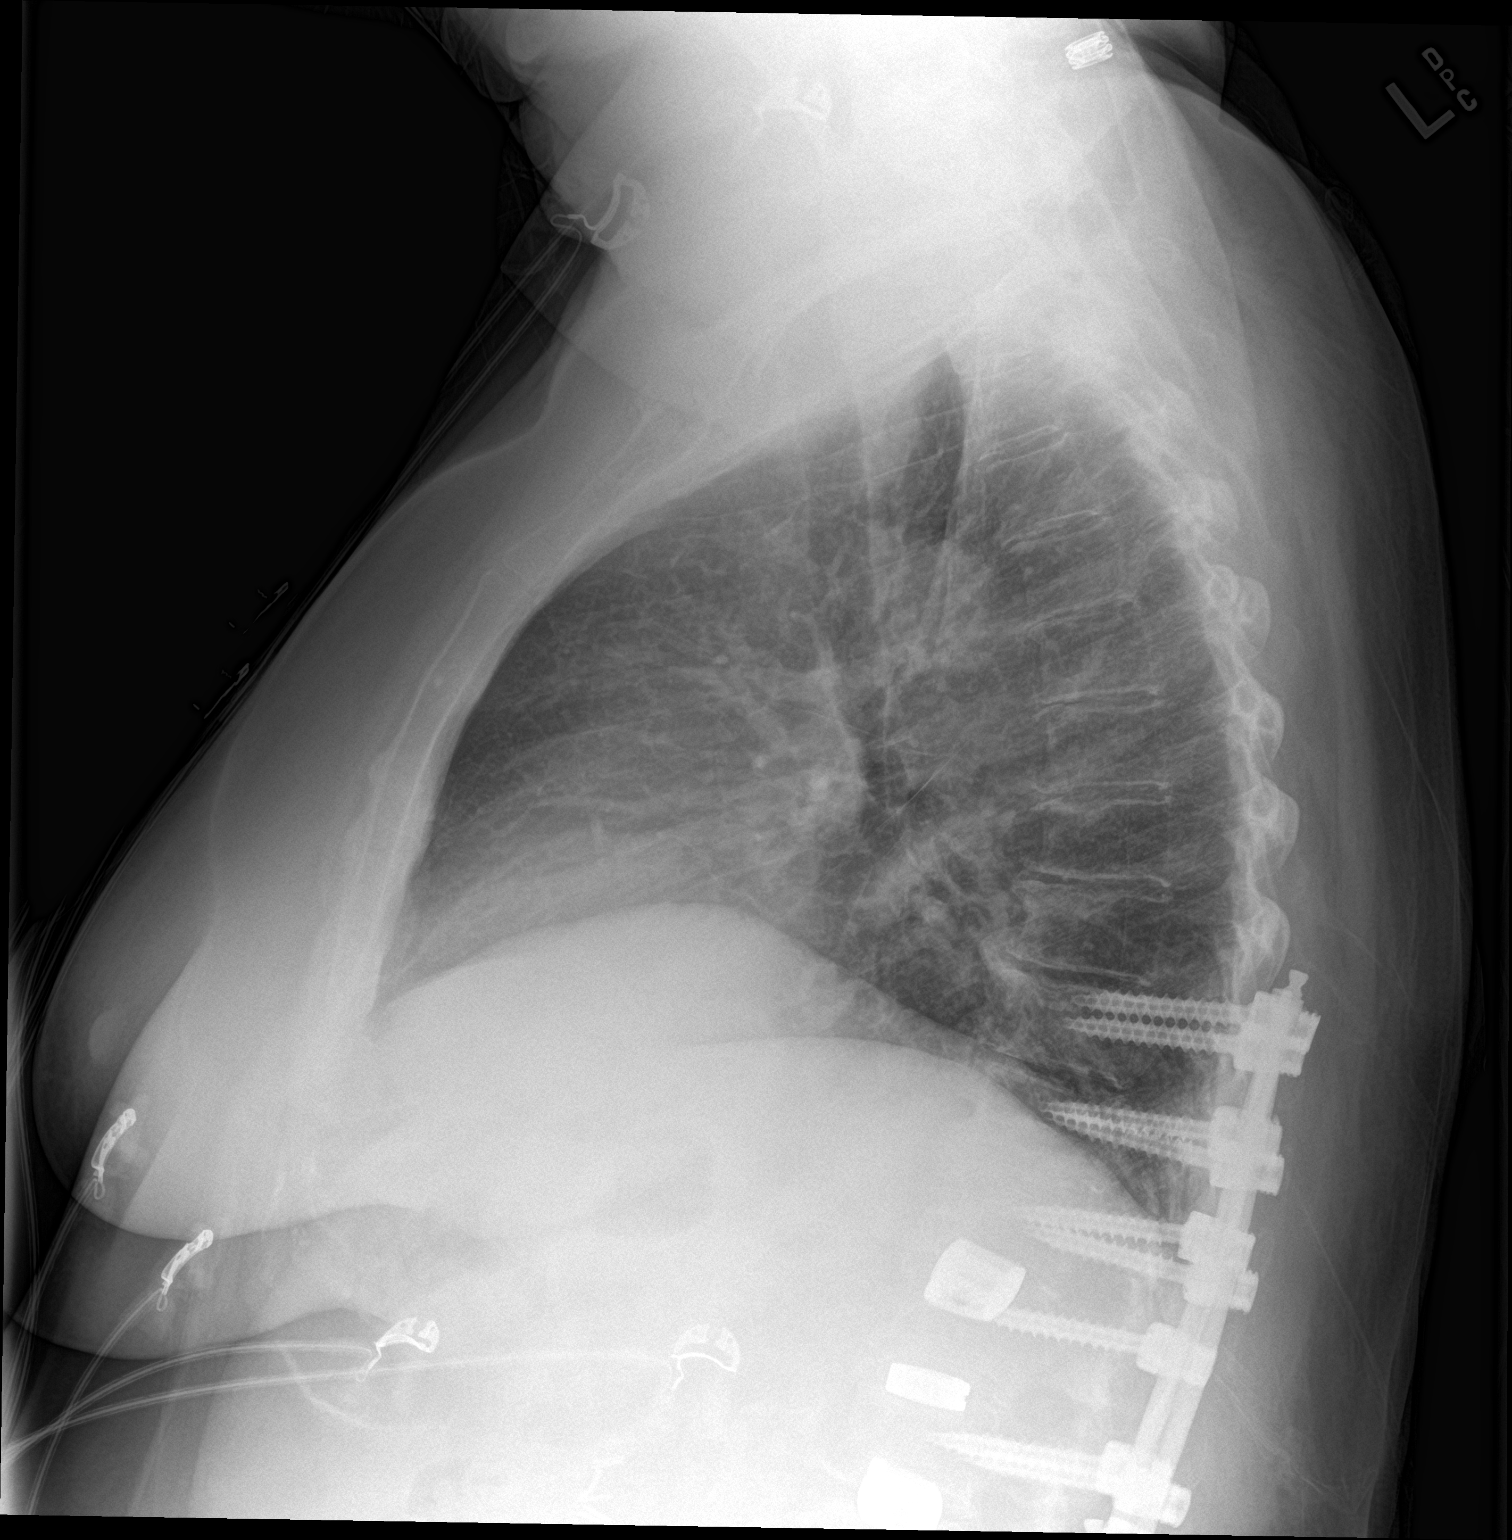

[2 of 2 positions shown; findings below may reference images not displayed]

FINDINGS: Lung volumes are low. There is bronchial/interstitial prominence.
Subsegmental atelectasis at the left lung base. The
cardiomediastinal contours are normal. Pulmonary vasculature is
normal. No consolidation, pleural effusion, or pneumothorax. No
acute osseous abnormalities are seen. Postsurgical change in the
thoracolumbar spine, partially included.
IMPRESSION: Bronchial/interstitial prominence may be bronchial inflammation such
as bronchitis or pulmonary edema.

## 2017-03-29 MED ORDER — MELOXICAM 7.5 MG PO TABS: 7.5000 mg | ORAL_TABLET | Freq: Every day | ORAL | 0 refills | Status: DC

## 2017-03-29 MED ORDER — MORPHINE SULFATE (PF) 4 MG/ML IV SOLN: 4.0000 mg | Freq: Once | INTRAVENOUS | Status: DC

## 2017-03-29 MED ORDER — NITROGLYCERIN 0.4 MG SL SUBL
0.4000 mg | SUBLINGUAL_TABLET | SUBLINGUAL | Status: DC | PRN
  Administered 2017-03-29 (×2): 0.4 mg via SUBLINGUAL
  Filled 2017-03-29: qty 1

## 2017-03-29 MED ORDER — ASPIRIN 81 MG PO CHEW
324.0000 mg | CHEWABLE_TABLET | Freq: Once | ORAL | Status: AC
  Administered 2017-03-29: 324 mg via ORAL
  Filled 2017-03-29: qty 4

## 2017-03-29 MED ORDER — MORPHINE SULFATE (PF) 4 MG/ML IV SOLN
4.0000 mg | Freq: Once | INTRAVENOUS | Status: AC
  Administered 2017-03-29: 4 mg via INTRAMUSCULAR
  Filled 2017-03-29: qty 1

## 2017-03-29 MED ORDER — OXYCODONE-ACETAMINOPHEN 5-325 MG PO TABS: 1.0000 | ORAL_TABLET | Freq: Once | ORAL | Status: DC

## 2017-03-29 NOTE — ED Triage Notes (Signed)
Pt c/o intermittent chest pain to the left chest that radiates to the left shoulder.

## 2017-03-29 NOTE — Discharge Instructions (Signed)
Continue taking your pain medication as prescribed. Return if symptoms are getting worse.

## 2017-03-29 NOTE — ED Provider Notes (Signed)
[facility] DEPT Provider Note   CSN: [id number] Arrival date & time: 03/29/17  0018     History   Chief Complaint Chief Complaint  Patient presents with  . Chest Pain    HPI Natasha Warren is a 71 y.o. female.  The history is provided by the patient.  She has history of hypertension and diabetes and SVT. Last night, she had pain in the left scapular area which resolved. He recurred at about 7 PM tonight. She describes the pain as sharp with some radiation to the left anterior chest. There is no associated dyspnea, nausea, diaphoresis. It is not worse with deep breath or movement or exertion. Nothing makes it better. She did take a dose of oxycodone-acetaminophen without any relief. She takes the oxycodone for chronic back pain. She denies tobacco use or hyperlipidemia. There is history of diabetes and hypertension and family history of premature coronary atherosclerosis.  Past Medical History:  Diagnosis Date  . Arthritis   . Bulging lumbar disc   . Colon polyps   . History of gout   . History of gout   . Hyperlipidemia   . Hyperparathyroidism, primary ([place])   . Hypertension   . Hypothyroidism   . Irritable bowel syndrome   . Nocturia   . Renal disorder    kidney stones  . Seasonal allergies   . Thyroid disorder   . Urolithiasis     Patient Active Problem List   Diagnosis Date Noted  . History of colonic polyps 05/29/2016  . Hydronephrosis with renal and ureteral calculus obstruction 01/07/2015  . Acute kidney injury ([place]) 10/06/2014  . Febrile urinary tract infection 10/03/2014  . Left ureteral calculus 10/03/2014  . Unspecified vitamin D deficiency 10/22/2013  . Nephrolithiasis 03/10/2013  . Hyperparathyroidism s/p left superior parathyroidectomy 03/10/2013    Past Surgical History:  Procedure Laterality Date  . ABDOMINAL HYSTERECTOMY  1986  . APPENDECTOMY  1954  . bladder tack  09/1984  . CHOLECYSTECTOMY  01/1999  . COLONOSCOPY  06/2006   This was  her second one  . COLONOSCOPY  07/12/2011   Procedure: COLONOSCOPY;  Surgeon: Rogene Houston, MD;  [facility];  Service: Endoscopy;  Laterality: N/A;  10:30 am  . CYSTOSCOPY W/ URETERAL STENT PLACEMENT Left 10/03/2014   Procedure: CYSTOSCOPY WITH URETERAL STENT PLACEMENT;  Surgeon: Malka So, MD;  [facility];  Service: Urology;  Laterality: Left;  . CYSTOSCOPY WITH URETEROSCOPY AND STENT PLACEMENT Left 03/16/2015   Procedure: CYSTOSCOPY WITH LEFT STENT PLACEMENT;  Surgeon: Franchot Gallo, MD;  Location: [facility];  Service: Urology;  Laterality: Left;  . gout removal  2017  . HOLMIUM LASER APPLICATION Left 0/06/9322   Procedure: HOLMIUM LASER APPLICATION;  Surgeon: Franchot Gallo, MD;  [facility];  Service: Urology;  Laterality: Left;  . KNEE ARTHROSCOPY  12/2010   Left knee  . LITHOTRIPSY  06/1995  . NEPHROLITHOTOMY Left 01/07/2015   Procedure: NEPHROLITHOTOMY PERCUTANEOUS;  Surgeon: Franchot Gallo, MD;  [facility];  Service: Urology;  Laterality: Left;  With STENT  . Rotor Cuff  2011   Right Shoulder  . STENT REMOVAL Left 03/16/2015   Procedure: LEFT URETERAL STENT REMOVAL;  Surgeon: Franchot Gallo, MD;  Location: [facility];  Service: Urology;  Laterality: Left;  . THYROID EXPLORATION N/A 07/31/2013   Procedure: neck EXPLORATION;  Surgeon: Odis Hollingshead, MD;  [facility];  Service: General;  Laterality: N/A;  . THYROIDECTOMY N/A 07/31/2013   Procedure:  PARATHYROIDECTOMY;  Surgeon: Odis Hollingshead, MD;  Location[facility];  Service: General;  Laterality: N/A;  . TUMOR REMOVAL  06/1997   Beign; on back x2 surgeries  . TUMOR REMOVAL  2002   "FATTY TUMORS" REMOVED FROM BACK  . URETEROSCOPY WITH HOLMIUM LASER LITHOTRIPSY Left 03/16/2015   Procedure: LEFT URETEROSCOPIC LASER LITHOTRIPSY WITH STONE EXTRACTION;  Surgeon: Franchot Gallo, MD;  Location: [facility];  Service: Urology;  Laterality: Left;    OB History    No data available        Home Medications    Prior to Admission medications   Medication Sig Start Date End Date Taking? Authorizing Provider  acetaminophen (TYLENOL) 325 MG tablet Take 650 mg by mouth every 6 (six) hours as needed.   Yes [provider]  allopurinol (ZYLOPRIM) 300 MG tablet Take 300 mg by mouth daily.  05/14/16  Yes [provider]  docusate sodium (COLACE) 100 MG capsule Take 100 mg by mouth 2 (two) times daily.   Yes [provider]  levothyroxine (SYNTHROID, LEVOTHROID) 50 MCG tablet Take 50 mcg by mouth daily before breakfast.    Yes [provider]  losartan (COZAAR) 100 MG tablet Take 100 mg by mouth daily.  03/17/16  Yes [provider]  meloxicam (MOBIC) 7.5 MG tablet Take 7.5 mg by mouth daily.   Yes [provider]  metFORMIN (GLUCOPHAGE-XR) 500 MG 24 hr tablet Take 500 mg by mouth daily with breakfast.  05/14/16  Yes [provider]  metoprolol succinate (TOPROL-XL) 50 MG 24 hr tablet Take 1 & 1/2 tablets daily. 09/28/15  Yes BranchAlphonse Guild, MD  oxyCODONE (OXY IR/ROXICODONE) 5 MG immediate release tablet Take 15 mg by mouth 3 (three) times daily as needed for severe pain.   Yes [provider]  potassium citrate (UROCIT-K) 10 MEQ (1080 MG) SR tablet Take 10 mEq by mouth 3 (three) times daily with meals.   Yes [provider]  tizanidine (ZANAFLEX) 2 MG capsule Take 2 mg by mouth 3 (three) times daily.   Yes [provider]  HYDROcodone-acetaminophen (NORCO/VICODIN) 5-325 MG tablet Take 1 tablet by mouth every 6 (six) hours as needed.  03/10/16   [provider]  LORazepam (ATIVAN) 0.5 MG tablet Take 0.5 mg by mouth at bedtime as needed for sleep.     [provider]    Family History Family History  Problem Relation Age of Onset  . Hypertension Mother   . Arthritis Mother   . Hypertension Father   . Healthy Brother   . Healthy Daughter   . Healthy Daughter   . Colon  cancer Neg Hx     Social History Social History  Substance Use Topics  . Smoking status: Never Smoker  . Smokeless tobacco: Never Used  . Alcohol use Yes     Comment: Very Rarely 1/2 glass of beer     Allergies   Ace inhibitors   Review of Systems Review of Systems  All other systems reviewed and are negative.    Physical Exam Updated Vital Signs BP (!) 183/83 (BP Location: Left Arm)   Pulse 64   Temp 98.8 F (37.1 C) (Oral)   Resp 20   Ht 5\' 3"  (1.6 m)   Wt 87.1 kg (192 lb)   SpO2 97%   BMI 34.01 kg/m   Physical Exam  Nursing note and vitals reviewed.  71 year old female, resting comfortably and in no acute distress. Vital  signs are significant for hypertension. Oxygen saturation is 97%, which is normal. Head is normocephalic and atraumatic. PERRLA, EOMI. Oropharynx is clear. Neck is nontender and supple without adenopathy or JVD. Back has well localized tenderness superior to the left scapula. There is no midline tenderness. There is no CVA tenderness. Lungs are clear without rales, wheezes, or rhonchi. Chest is moderately tender in the left anterior chest wall superiorly. Heart has regular rate and rhythm without murmur. Abdomen is soft, flat, nontender without masses or hepatosplenomegaly and peristalsis is normoactive. Extremities have trace edema, full range of motion is present. Skin is warm and dry without rash. Neurologic: Mental status is normal, cranial nerves are intact, there are no motor or sensory deficits.  ED Treatments / Results  Labs (all labs ordered are listed, but only abnormal results are displayed) Labs Reviewed  BASIC METABOLIC PANEL - Abnormal; Notable for the following:       Result Value   Glucose, Bld 104 (*)    BUN 24 (*)    Creatinine, Ser 1.10 (*)    GFR calc non Af Amer 50 (*)    GFR calc Af Amer 58 (*)    All other components within normal limits  CBC WITH DIFFERENTIAL/PLATELET  I-STAT TROPOININ, ED  I-STAT TROPOININ,  ED  Randolm Idol, ED    EKG  EKG Interpretation  Date/Time:  Thursday March 29 2017 00:33:15 EDT Ventricular Rate:  64 PR Interval:    QRS Duration: 82 QT Interval:  416 QTC Calculation: 430 R Axis:   1 Text Interpretation:  Sinus rhythm Normal ECG When compared with ECG of 09/16/2015, QRS voltage has normalized Confirmed by Delora Fuel ([id number]) on 03/29/2017 12:39:35 AM       Radiology Dg Chest 2 View  Result Date: 03/29/2017 CLINICAL DATA:  Chest pain. EXAM: CHEST  2 VIEW COMPARISON:  Radiograph 09/16/2015 FINDINGS: Lung volumes are low. There is bronchial/interstitial prominence. Subsegmental atelectasis at the left lung base. The cardiomediastinal contours are normal. Pulmonary vasculature is normal. No consolidation, pleural effusion, or pneumothorax. No acute osseous abnormalities are seen. Postsurgical change in the thoracolumbar spine, partially included. IMPRESSION: Bronchial/interstitial prominence may be bronchial inflammation such as bronchitis or pulmonary edema. Electronically Signed   By: Jeb Levering M.D.   On: 03/29/2017 02:04    Procedures Procedures (including critical care time)  Medications Ordered in ED Medications  nitroGLYCERIN (NITROSTAT) SL tablet 0.4 mg (0.4 mg Sublingual Given 03/29/17 0145)  aspirin chewable tablet 324 mg (324 mg Oral Given 03/29/17 0114)  morphine 4 MG/ML injection 4 mg (4 mg Intramuscular Given 03/29/17 0343)     Initial Impression / Assessment and Plan / ED Course  I have reviewed the triage vital signs and the nursing notes.  Pertinent labs & imaging results that were available during my care of the patient were reviewed by me and considered in my medical decision making (see chart for details).  Scapular and chest pain which seemed to be most likely musculoskeletal in origin. ECG shows no acute changes. Review of past records shows she has been evaluated by cardiology for SVT, had normal ejection fraction on  echocardiogram, and had complaint of atypical chest pain in the past with no workup felt to be needed. She will be given aspirin and nitroglycerin in the ED. Heart Score = 3, which puts her at low risk for major adverse cardiac events in the next 30 days.  She had no relief with nitroglycerin. She was given a dose of  morphine with partial relief of pain. Repeat troponin is unchanged. She does have a cardiologist with whom she can follow-up. She is discharged with prescription for meloxicam. Return precautions discussed. She is to contact her cardiologist for appointment for consideration for outpatient stress testing.  Final Clinical Impressions(s) / ED Diagnoses   Final diagnoses:  Atypical chest pain    New Prescriptions Current Discharge Medication List       Delora Fuel, MD 97/94/80 [phone]

## 2017-03-29 NOTE — ED Notes (Signed)
IV access attempted x2 without success.

## 2017-04-02 ENCOUNTER — Encounter: Payer: Self-pay | Admitting: Cardiology

## 2017-04-02 ENCOUNTER — Ambulatory Visit (INDEPENDENT_AMBULATORY_CARE_PROVIDER_SITE_OTHER): Payer: Medicare Other | Admitting: Cardiology

## 2017-04-02 VITALS — BP 134/82 | HR 82 | Ht 64.0 in | Wt 200.0 lb

## 2017-04-02 DIAGNOSIS — R0789 Other chest pain: Secondary | ICD-10-CM

## 2017-04-02 DIAGNOSIS — I471 Supraventricular tachycardia: Secondary | ICD-10-CM | POA: Diagnosis not present

## 2017-04-02 MED ORDER — PREDNISONE 20 MG PO TABS: ORAL_TABLET | ORAL | 0 refills | Status: DC

## 2017-04-02 NOTE — Patient Instructions (Signed)
Your physician recommends that you schedule a follow-up appointment in:  To be determined    Take prednisone 40 mg ( 2 tablets)  Daily for 5 days and then Stop    Call us on Thursday, July 19 th with update on symptoms   Your physician recommends that you continue on your current medications as directed. Please refer to the Current Medication list given to you today.    No labs or testing ordered today.    Thank you for choosing [facility] !

## 2017-04-02 NOTE — Progress Notes (Signed)
Clinical Summary Natasha Warren is a 71 y.o.female seen today for follow up of the following medical problems.   1. PSVT - no recent palpitations  2. Chest pain - history of inermittent chest dull pain 2-3/10. At rest or with exertion, often with laying down. Can occur after meals. Lasts approx 15-20 minutes. No SOB or palpitations. Ongoing for 1 year, symptoms unchanged since last visit - no recent symptoms.   - seen in ER 03/29/17 with chest pain, primarily left scapular pain radiating into chest. From notes thought to be MSK pain.EKG and cardiac enzymes unremarkable.  Discharged with meloxicam.   - started a few days ago. Started left shoulder blade into left and left chest. Pressure like pain, 6-7/10. Had some occasional palpitations, no other symptoms. Worst with breathing. Lasted about 1 hr, resolved on its own. Recurrent episode the following night, lasted about 8-10 hrs - still with pain at times.  - not positional - No SOB or DOE.  - CXR possibly some bronchial inflammation vs bronchitis,      SH: Her husband YAJAHIRA TISON is also a patient of mine, he recently passed away 01/31/2017.    Past Medical History:  Diagnosis Date  . Arthritis   . Bulging lumbar disc   . Colon polyps   . History of gout   . History of gout   . Hyperlipidemia   . Hyperparathyroidism, primary ([place])   . Hypertension   . Hypothyroidism   . Irritable bowel syndrome   . Nocturia   . Renal disorder    kidney stones  . Seasonal allergies   . Thyroid disorder   . Urolithiasis      Allergies  Allergen Reactions  . Ace Inhibitors     Patient doesn't remember this allergy.  On 12/30/2014 received LOV visit from PCP , Dr Rory Percy in [place], [place].  - No known active drug allergies listed in his office visit note dated 05/21/2014.       Current Outpatient Prescriptions  Medication Sig Dispense Refill  . acetaminophen (TYLENOL) 325 MG tablet Take 650 mg by mouth every 6 (six) hours as  needed.    Marland Kitchen allopurinol (ZYLOPRIM) 300 MG tablet Take 300 mg by mouth daily.   5  . docusate sodium (COLACE) 100 MG capsule Take 100 mg by mouth 2 (two) times daily.    Marland Kitchen levothyroxine (SYNTHROID, LEVOTHROID) 50 MCG tablet Take 50 mcg by mouth daily before breakfast.     . losartan (COZAAR) 100 MG tablet Take 100 mg by mouth daily.   1  . meloxicam (MOBIC) 7.5 MG tablet Take 1 tablet (7.5 mg total) by mouth daily. 20 tablet 0  . metFORMIN (GLUCOPHAGE-XR) 500 MG 24 hr tablet Take 500 mg by mouth daily with breakfast.   5  . metoprolol succinate (TOPROL-XL) 50 MG 24 hr tablet Take 1 & 1/2 tablets daily. 45 tablet 135  . oxyCODONE (OXY IR/ROXICODONE) 5 MG immediate release tablet Take 15 mg by mouth 3 (three) times daily as needed for severe pain.    . potassium citrate (UROCIT-K) 10 MEQ (1080 MG) SR tablet Take 10 mEq by mouth 3 (three) times daily with meals.    . tizanidine (ZANAFLEX) 2 MG capsule Take 2 mg by mouth 3 (three) times daily.     No current facility-administered medications for this visit.    Facility-Administered Medications Ordered in Other Visits  Medication Dose Route Frequency Provider Last Rate Last Dose  .  acetaminophen (TYLENOL) tablet 650 mg  650 mg Oral Q4H PRN Irine Seal, MD         Past Surgical History:  Procedure Laterality Date  . ABDOMINAL HYSTERECTOMY  1986  . APPENDECTOMY  1954  . bladder tack  09/1984  . CHOLECYSTECTOMY  01/1999  . COLONOSCOPY  06/2006   This was her second one  . COLONOSCOPY  07/12/2011   Procedure: COLONOSCOPY;  Surgeon: Rogene Houston, MD;  [facility];  Service: Endoscopy;  Laterality: N/A;  10:30 am  . CYSTOSCOPY W/ URETERAL STENT PLACEMENT Left 10/03/2014   Procedure: CYSTOSCOPY WITH URETERAL STENT PLACEMENT;  Surgeon: Malka So, MD;  Location: [facility];  Service: Urology;  Laterality: Left;  . CYSTOSCOPY WITH URETEROSCOPY AND STENT PLACEMENT Left 03/16/2015   Procedure: CYSTOSCOPY WITH LEFT STENT PLACEMENT;   Surgeon: Franchot Gallo, MD;  Location: [facility];  Service: Urology;  Laterality: Left;  . gout removal  2017  . HOLMIUM LASER APPLICATION Left 01/18/7740   Procedure: HOLMIUM LASER APPLICATION;  Surgeon: Franchot Gallo, MD;  [facility];  Service: Urology;  Laterality: Left;  . KNEE ARTHROSCOPY  12/2010   Left knee  . LITHOTRIPSY  06/1995  . NEPHROLITHOTOMY Left 01/07/2015   Procedure: NEPHROLITHOTOMY PERCUTANEOUS;  Surgeon: Franchot Gallo, MD;  [facility];  Service: Urology;  Laterality: Left;  With STENT  . Rotor Cuff  2011   Right Shoulder  . STENT REMOVAL Left 03/16/2015   Procedure: LEFT URETERAL STENT REMOVAL;  Surgeon: Franchot Gallo, MD;  Location: [facility];  Service: Urology;  Laterality: Left;  . THYROID EXPLORATION N/A 07/31/2013   Procedure: neck EXPLORATION;  Surgeon: Odis Hollingshead, MD;  [facility];  Service: General;  Laterality: N/A;  . THYROIDECTOMY N/A 07/31/2013   Procedure: PARATHYROIDECTOMY;  Surgeon: Odis Hollingshead, MD;  [facility];  Service: General;  Laterality: N/A;  . TUMOR REMOVAL  06/1997   Beign; on back x2 surgeries  . TUMOR REMOVAL  2002   "FATTY TUMORS" REMOVED FROM BACK  . URETEROSCOPY WITH HOLMIUM LASER LITHOTRIPSY Left 03/16/2015   Procedure: LEFT URETEROSCOPIC LASER LITHOTRIPSY WITH STONE EXTRACTION;  Surgeon: Franchot Gallo, MD;  Location: [facility];  Service: Urology;  Laterality: Left;     Allergies  Allergen Reactions  . Ace Inhibitors     Patient doesn't remember this allergy.  On 12/30/2014 received LOV visit from PCP , Dr Rory Percy in [place], [place].  - No known active drug allergies listed in his office visit note dated 05/21/2014.        Family History  Problem Relation Age of Onset  . Hypertension Mother   . Arthritis Mother   . Hypertension Father   . Healthy Brother   . Healthy Daughter   . Healthy Daughter   . Colon cancer Neg Hx      Social History Ms. Lambertson reports that she has never  smoked. She has never used smokeless tobacco. Ms. Mullan reports that she drinks alcohol.   Review of Systems CONSTITUTIONAL: No weight loss, fever, chills, weakness or fatigue.  HEENT: Eyes: No visual loss, blurred vision, double vision or yellow sclerae.No hearing loss, sneezing, congestion, runny nose or sore throat.  SKIN: No rash or itching.  CARDIOVASCULAR: per hpi RESPIRATORY: No shortness of breath, cough or sputum.  GASTROINTESTINAL: No anorexia, nausea, vomiting or diarrhea. No abdominal pain or blood.  GENITOURINARY: No burning on urination, no polyuria NEUROLOGICAL: No headache, dizziness, syncope, paralysis, ataxia, numbness  or tingling in the extremities. No change in bowel or bladder control.  MUSCULOSKELETAL: per hpi LYMPHATICS: No enlarged nodes. No history of splenectomy.  PSYCHIATRIC: No history of depression or anxiety.  ENDOCRINOLOGIC: No reports of sweating, cold or heat intolerance. No polyuria or polydipsia.  Marland Kitchen   Physical Examination Vitals:   04/02/17 1103  BP: 134/82  Pulse: 82   Vitals:   04/02/17 1103  Weight: 200 lb (90.7 kg)  Height: 5\' 4"  (1.626 m)    Gen: resting comfortably, no acute distress HEENT: no scleral icterus, pupils equal round and reactive, no palptable cervical adenopathy,  CV: RRR, no m/r/g, no jvd Resp: Clear to auscultation bilaterally GI: abdomen is soft, non-tender, non-distended, normal bowel sounds, no hepatosplenomegaly MSK: extremities are warm, no edema.  Skin: warm, no rash Neuro:  no focal deficits Psych: appropriate affect   Diagnostic Studies Jan 2017 echo Study Conclusions  - Left ventricle: The cavity size was normal. Wall thickness was increased in a pattern of mild LVH. Systolic function was normal. The estimated ejection fraction was in the range of 60% to 65%. Wall motion was normal; there were no regional wall motion abnormalities. Doppler parameters are consistent with abnormal left  ventricular relaxation (grade 1 diastolic dysfunction). - Aortic valve: Mildly calcified annulus. - Mitral valve: Calcified annulus. There was mild regurgitation. - Tricuspid valve: There was mild regurgitation.    Assessment and Plan  1. PSVT - no recent symptoms - continue current meds  2. Chest pain - atypical chest pain, possibly MSK pain related to her chronic back problems. Has not responded to NSAIDs - EKG in clinic today SR, without ischemic changes - given 5 day course of prednisone - if ongoing symptoms consider stress testing. She is not able to hold her arms above her head, would lean toward a DSE.  - she will call us later in the week with update on symptoms.         Arnoldo Lenis, M.D

## 2017-04-05 ENCOUNTER — Telehealth: Payer: Self-pay | Admitting: Cardiology

## 2017-04-05 NOTE — Telephone Encounter (Signed)
LVM Returning Cathey's call

## 2017-04-05 NOTE — Telephone Encounter (Signed)
Patient feels somewhat better after day 2 of steroids but now has some jitters.She has 2 days left of treatment.She says she will call back on Monday after she finishes prednisone

## 2017-04-09 ENCOUNTER — Telehealth: Payer: Self-pay

## 2017-04-09 NOTE — Telephone Encounter (Signed)
Pain remains in left should blade, describes as pain in back and feels jittery.Finished steroids yesterday.Pain has improved in her front,just pain now in the back

## 2017-04-09 NOTE — Telephone Encounter (Signed)
I would continue to monitor symptoms at this time. I have a fairly low suspicion this is anything heart related, especially if steroids seemed to help somewhat. Have her update [place] later in the week on her symptoms. At this time I would not recommend doing a stress test.    J Memorie Yokoyama MD

## 2017-04-10 NOTE — Telephone Encounter (Signed)
Pt notified of Dr Nelly Laurence message

## 2017-05-31 ENCOUNTER — Ambulatory Visit: Payer: Medicare Other | Admitting: Cardiology

## 2017-06-05 DIAGNOSIS — M5106 Intervertebral disc disorders with myelopathy, lumbar region: Secondary | ICD-10-CM | POA: Diagnosis not present

## 2017-06-05 DIAGNOSIS — Z6835 Body mass index (BMI) 35.0-35.9, adult: Secondary | ICD-10-CM | POA: Diagnosis not present

## 2017-06-05 DIAGNOSIS — M4150 Other secondary scoliosis, site unspecified: Secondary | ICD-10-CM | POA: Diagnosis not present

## 2017-06-14 DIAGNOSIS — Z6836 Body mass index (BMI) 36.0-36.9, adult: Secondary | ICD-10-CM | POA: Diagnosis not present

## 2017-06-14 DIAGNOSIS — E882 Lipomatosis, not elsewhere classified: Secondary | ICD-10-CM | POA: Diagnosis not present

## 2017-06-25 DIAGNOSIS — R1032 Left lower quadrant pain: Secondary | ICD-10-CM | POA: Diagnosis not present

## 2017-06-25 DIAGNOSIS — K5730 Diverticulosis of large intestine without perforation or abscess without bleeding: Secondary | ICD-10-CM | POA: Diagnosis not present

## 2017-06-25 DIAGNOSIS — E882 Lipomatosis, not elsewhere classified: Secondary | ICD-10-CM | POA: Diagnosis not present

## 2017-06-25 DIAGNOSIS — N200 Calculus of kidney: Secondary | ICD-10-CM | POA: Diagnosis not present

## 2017-07-13 DIAGNOSIS — Z23 Encounter for immunization: Secondary | ICD-10-CM | POA: Diagnosis not present

## 2017-07-13 DIAGNOSIS — Z6836 Body mass index (BMI) 36.0-36.9, adult: Secondary | ICD-10-CM | POA: Diagnosis not present

## 2017-07-13 DIAGNOSIS — E039 Hypothyroidism, unspecified: Secondary | ICD-10-CM | POA: Diagnosis not present

## 2017-07-13 DIAGNOSIS — M4807 Spinal stenosis, lumbosacral region: Secondary | ICD-10-CM | POA: Diagnosis not present

## 2017-07-13 DIAGNOSIS — I10 Essential (primary) hypertension: Secondary | ICD-10-CM | POA: Diagnosis not present

## 2017-08-03 DIAGNOSIS — M4150 Other secondary scoliosis, site unspecified: Secondary | ICD-10-CM | POA: Diagnosis not present

## 2017-08-03 DIAGNOSIS — Z981 Arthrodesis status: Secondary | ICD-10-CM | POA: Diagnosis not present

## 2017-08-03 DIAGNOSIS — M4326 Fusion of spine, lumbar region: Secondary | ICD-10-CM | POA: Diagnosis not present

## 2017-08-03 DIAGNOSIS — M5106 Intervertebral disc disorders with myelopathy, lumbar region: Secondary | ICD-10-CM | POA: Diagnosis not present

## 2017-08-17 DIAGNOSIS — M545 Low back pain: Secondary | ICD-10-CM | POA: Diagnosis not present

## 2017-08-17 DIAGNOSIS — M546 Pain in thoracic spine: Secondary | ICD-10-CM | POA: Diagnosis not present

## 2017-08-17 DIAGNOSIS — R262 Difficulty in walking, not elsewhere classified: Secondary | ICD-10-CM | POA: Diagnosis not present

## 2017-08-17 DIAGNOSIS — M6281 Muscle weakness (generalized): Secondary | ICD-10-CM | POA: Diagnosis not present

## 2017-08-21 DIAGNOSIS — M546 Pain in thoracic spine: Secondary | ICD-10-CM | POA: Diagnosis not present

## 2017-08-21 DIAGNOSIS — M6281 Muscle weakness (generalized): Secondary | ICD-10-CM | POA: Diagnosis not present

## 2017-08-21 DIAGNOSIS — M545 Low back pain: Secondary | ICD-10-CM | POA: Diagnosis not present

## 2017-08-21 DIAGNOSIS — R262 Difficulty in walking, not elsewhere classified: Secondary | ICD-10-CM | POA: Diagnosis not present

## 2017-08-22 DIAGNOSIS — R0781 Pleurodynia: Secondary | ICD-10-CM | POA: Diagnosis not present

## 2017-08-22 DIAGNOSIS — I700 Atherosclerosis of aorta: Secondary | ICD-10-CM | POA: Diagnosis not present

## 2017-08-23 DIAGNOSIS — M6281 Muscle weakness (generalized): Secondary | ICD-10-CM | POA: Diagnosis not present

## 2017-08-23 DIAGNOSIS — R262 Difficulty in walking, not elsewhere classified: Secondary | ICD-10-CM | POA: Diagnosis not present

## 2017-08-23 DIAGNOSIS — M546 Pain in thoracic spine: Secondary | ICD-10-CM | POA: Diagnosis not present

## 2017-08-23 DIAGNOSIS — M545 Low back pain: Secondary | ICD-10-CM | POA: Diagnosis not present

## 2017-08-31 DIAGNOSIS — R262 Difficulty in walking, not elsewhere classified: Secondary | ICD-10-CM | POA: Diagnosis not present

## 2017-08-31 DIAGNOSIS — M6281 Muscle weakness (generalized): Secondary | ICD-10-CM | POA: Diagnosis not present

## 2017-08-31 DIAGNOSIS — M545 Low back pain: Secondary | ICD-10-CM | POA: Diagnosis not present

## 2017-08-31 DIAGNOSIS — M546 Pain in thoracic spine: Secondary | ICD-10-CM | POA: Diagnosis not present

## 2017-09-04 DIAGNOSIS — M6281 Muscle weakness (generalized): Secondary | ICD-10-CM | POA: Diagnosis not present

## 2017-09-04 DIAGNOSIS — M546 Pain in thoracic spine: Secondary | ICD-10-CM | POA: Diagnosis not present

## 2017-09-04 DIAGNOSIS — M545 Low back pain: Secondary | ICD-10-CM | POA: Diagnosis not present

## 2017-09-04 DIAGNOSIS — R262 Difficulty in walking, not elsewhere classified: Secondary | ICD-10-CM | POA: Diagnosis not present

## 2017-09-13 DIAGNOSIS — M545 Low back pain: Secondary | ICD-10-CM | POA: Diagnosis not present

## 2017-09-13 DIAGNOSIS — M6281 Muscle weakness (generalized): Secondary | ICD-10-CM | POA: Diagnosis not present

## 2017-09-13 DIAGNOSIS — M546 Pain in thoracic spine: Secondary | ICD-10-CM | POA: Diagnosis not present

## 2017-09-13 DIAGNOSIS — R262 Difficulty in walking, not elsewhere classified: Secondary | ICD-10-CM | POA: Diagnosis not present

## 2017-09-14 DIAGNOSIS — M545 Low back pain: Secondary | ICD-10-CM | POA: Diagnosis not present

## 2017-09-14 DIAGNOSIS — M6281 Muscle weakness (generalized): Secondary | ICD-10-CM | POA: Diagnosis not present

## 2017-09-14 DIAGNOSIS — R262 Difficulty in walking, not elsewhere classified: Secondary | ICD-10-CM | POA: Diagnosis not present

## 2017-09-14 DIAGNOSIS — M546 Pain in thoracic spine: Secondary | ICD-10-CM | POA: Diagnosis not present

## 2017-09-19 DIAGNOSIS — M545 Low back pain: Secondary | ICD-10-CM | POA: Diagnosis not present

## 2017-09-19 DIAGNOSIS — R262 Difficulty in walking, not elsewhere classified: Secondary | ICD-10-CM | POA: Diagnosis not present

## 2017-09-19 DIAGNOSIS — M546 Pain in thoracic spine: Secondary | ICD-10-CM | POA: Diagnosis not present

## 2017-09-19 DIAGNOSIS — M6281 Muscle weakness (generalized): Secondary | ICD-10-CM | POA: Diagnosis not present

## 2017-09-21 DIAGNOSIS — M6281 Muscle weakness (generalized): Secondary | ICD-10-CM | POA: Diagnosis not present

## 2017-09-21 DIAGNOSIS — M546 Pain in thoracic spine: Secondary | ICD-10-CM | POA: Diagnosis not present

## 2017-09-21 DIAGNOSIS — M545 Low back pain: Secondary | ICD-10-CM | POA: Diagnosis not present

## 2017-09-21 DIAGNOSIS — R262 Difficulty in walking, not elsewhere classified: Secondary | ICD-10-CM | POA: Diagnosis not present

## 2017-09-25 ENCOUNTER — Ambulatory Visit (INDEPENDENT_AMBULATORY_CARE_PROVIDER_SITE_OTHER): Payer: Medicare Other | Admitting: Urology

## 2017-09-25 ENCOUNTER — Ambulatory Visit (INDEPENDENT_AMBULATORY_CARE_PROVIDER_SITE_OTHER): Payer: Medicare Other | Admitting: Cardiology

## 2017-09-25 ENCOUNTER — Encounter: Payer: Self-pay | Admitting: Cardiology

## 2017-09-25 VITALS — BP 126/90 | HR 82 | Ht 63.0 in | Wt 205.0 lb

## 2017-09-25 DIAGNOSIS — R0789 Other chest pain: Secondary | ICD-10-CM | POA: Diagnosis not present

## 2017-09-25 DIAGNOSIS — I471 Supraventricular tachycardia: Secondary | ICD-10-CM | POA: Diagnosis not present

## 2017-09-25 DIAGNOSIS — N201 Calculus of ureter: Secondary | ICD-10-CM

## 2017-09-25 NOTE — Progress Notes (Signed)
Clinical Summary Natasha Warren is a 72 y.o.female seen today for follow up of the following medical problems.   1. PSVT - no recent palpitations - compliatn with meds  2. Chest pain - history of inermittentchest dull pain 2-3/10. At rest or with exertion, often with laying down. Can occur after meals. Lasts approx 15-20 minutes. No SOB or palpitations. Ongoing for 1 year, symptoms unchanged since last visit - no recent symptoms.   - seen in ER 03/29/17 with chest pain, primarily left scapular pain radiating into chest. From notes thought to be MSK pain.EKG and cardiac enzymes unremarkable.  Discharged with meloxicam.  - no recent chest pain since last visit.     SH: Her husband Natasha Warren is also a patient of mine, he recently passed away 02/08/17.    Past Medical History:  Diagnosis Date  . Arthritis   . Bulging lumbar disc   . Colon polyps   . History of gout   . History of gout   . Hyperlipidemia   . Hyperparathyroidism, primary ([place])   . Hypertension   . Hypothyroidism   . Irritable bowel syndrome   . Nocturia   . Renal disorder    kidney stones  . Seasonal allergies   . Thyroid disorder   . Urolithiasis      Allergies  Allergen Reactions  . Ace Inhibitors     Patient doesn't remember this allergy.  On 12/30/2014 received LOV visit from PCP , Dr Rory Percy in [place], [place].  - No known active drug allergies listed in his office visit note dated 05/21/2014.       Current Outpatient Medications  Medication Sig Dispense Refill  . acetaminophen (TYLENOL) 325 MG tablet Take 650 mg by mouth every 6 (six) hours as needed.    Marland Kitchen allopurinol (ZYLOPRIM) 300 MG tablet Take 300 mg by mouth daily.   5  . docusate sodium (COLACE) 100 MG capsule Take 100 mg by mouth 2 (two) times daily.    Marland Kitchen levothyroxine (SYNTHROID, LEVOTHROID) 50 MCG tablet Take 50 mcg by mouth daily before breakfast.     . LORazepam (ATIVAN) 0.5 MG tablet Take 0.5 mg by mouth at bedtime.    Marland Kitchen  losartan (COZAAR) 100 MG tablet Take 100 mg by mouth daily.   1  . meloxicam (MOBIC) 7.5 MG tablet Take 1 tablet (7.5 mg total) by mouth daily. 20 tablet 0  . metFORMIN (GLUCOPHAGE-XR) 500 MG 24 hr tablet Take 500 mg by mouth daily with breakfast.   5  . metoprolol succinate (TOPROL-XL) 50 MG 24 hr tablet Take 1 & 1/2 tablets daily. 45 tablet 135  . oxyCODONE (OXY IR/ROXICODONE) 5 MG immediate release tablet Take 15 mg by mouth 3 (three) times daily as needed for severe pain.    . predniSONE (DELTASONE) 20 MG tablet Take 40 mg ( 2 tablets) daily for 5 days and then Stop 10 tablet 0  . tizanidine (ZANAFLEX) 2 MG capsule Take 2 mg by mouth 3 (three) times daily.     No current facility-administered medications for this visit.    Facility-Administered Medications Ordered in Other Visits  Medication Dose Route Frequency Provider Last Rate Last Dose  . acetaminophen (TYLENOL) tablet 650 mg  650 mg Oral Q4H PRN Irine Seal, MD         Past Surgical History:  Procedure Laterality Date  . ABDOMINAL HYSTERECTOMY  1986  . APPENDECTOMY  1954  . BACK SURGERY  09/2016  .  bladder tack  09/1984  . CHOLECYSTECTOMY  01/1999  . COLONOSCOPY  06/2006   This was her second one  . COLONOSCOPY  07/12/2011   Procedure: COLONOSCOPY;  Surgeon: Rogene Houston, MD;  [facility];  Service: Endoscopy;  Laterality: N/A;  10:30 am  . CYSTOSCOPY W/ URETERAL STENT PLACEMENT Left 10/03/2014   Procedure: CYSTOSCOPY WITH URETERAL STENT PLACEMENT;  Surgeon: Malka So, MD;  [facility];  Service: Urology;  Laterality: Left;  . CYSTOSCOPY WITH URETEROSCOPY AND STENT PLACEMENT Left 03/16/2015   Procedure: CYSTOSCOPY WITH LEFT STENT PLACEMENT;  Surgeon: Franchot Gallo, MD;  Location: [facility];  Service: Urology;  Laterality: Left;  . gout removal  2017  . HOLMIUM LASER APPLICATION Left 05/16/5620   Procedure: HOLMIUM LASER APPLICATION;  Surgeon: Franchot Gallo, MD;  [facility];  Service: Urology;   Laterality: Left;  . KNEE ARTHROSCOPY  12/2010   Left knee  . LITHOTRIPSY  06/1995  . LUMBAR FUSION  06/2016  . NEPHROLITHOTOMY Left 01/07/2015   Procedure: NEPHROLITHOTOMY PERCUTANEOUS;  Surgeon: Franchot Gallo, MD;  [facility];  Service: Urology;  Laterality: Left;  With STENT  . Rotor Cuff  2011   Right Shoulder  . STENT REMOVAL Left 03/16/2015   Procedure: LEFT URETERAL STENT REMOVAL;  Surgeon: Franchot Gallo, MD;  [facility];  Service: Urology;  Laterality: Left;  . THYROID EXPLORATION N/A 07/31/2013   Procedure: neck EXPLORATION;  Surgeon: Odis Hollingshead, MD;  [facility];  Service: General;  Laterality: N/A;  . THYROIDECTOMY N/A 07/31/2013   Procedure: PARATHYROIDECTOMY;  Surgeon: Odis Hollingshead, MD;  [facility];  Service: General;  Laterality: N/A;  . TUMOR REMOVAL  06/1997   Beign; on back x2 surgeries  . TUMOR REMOVAL  2002   "FATTY TUMORS" REMOVED FROM BACK  . URETEROSCOPY WITH HOLMIUM LASER LITHOTRIPSY Left 03/16/2015   Procedure: LEFT URETEROSCOPIC LASER LITHOTRIPSY WITH STONE EXTRACTION;  Surgeon: Franchot Gallo, MD;  [facility];  Service: Urology;  Laterality: Left;     Allergies  Allergen Reactions  . Ace Inhibitors     Patient doesn't remember this allergy.  On 12/30/2014 received LOV visit from PCP , Dr Rory Percy in [place], [place].  - No known active drug allergies listed in his office visit note dated 05/21/2014.        Family History  Problem Relation Age of Onset  . Hypertension Mother   . Arthritis Mother   . Hypertension Father   . Healthy Brother   . Healthy Daughter   . Healthy Daughter   . Colon cancer Neg Hx      Social History Ms. Farquhar reports that  has never smoked. she has never used smokeless tobacco. Ms. Scaglione reports that she drinks alcohol.   Review of Systems CONSTITUTIONAL: No weight loss, fever, chills, weakness or fatigue.  HEENT: Eyes: No visual loss, blurred vision, double vision or  yellow sclerae.No hearing loss, sneezing, congestion, runny nose or sore throat.  SKIN: No rash or itching.  CARDIOVASCULAR: per hpi RESPIRATORY: No shortness of breath, cough or sputum.  GASTROINTESTINAL: No anorexia, nausea, vomiting or diarrhea. No abdominal pain or blood.  GENITOURINARY: No burning on urination, no polyuria NEUROLOGICAL: No headache, dizziness, syncope, paralysis, ataxia, numbness or tingling in the extremities. No change in bowel or bladder control.  MUSCULOSKELETAL: No muscle, back pain, joint pain or stiffness.  LYMPHATICS: No enlarged nodes. No history of splenectomy.  PSYCHIATRIC: No history of  depression or anxiety.  ENDOCRINOLOGIC: No reports of sweating, cold or heat intolerance. No polyuria or polydipsia.  Marland Kitchen   Physical Examination Vitals:   09/25/17 1059  BP: 126/90  Pulse: 82  SpO2: 99%   Vitals:   09/25/17 1059  Weight: 205 lb (93 kg)  Height: 5\' 3"  (1.6 m)    Gen: resting comfortably, no acute distress HEENT: no scleral icterus, pupils equal round and reactive, no palptable cervical adenopathy,  CV: RRR, no m/r/g, no jvd Resp: Clear to auscultation bilaterally GI: abdomen is soft, non-tender, non-distended, normal bowel sounds, no hepatosplenomegaly MSK: extremities are warm, no edema.  Skin: warm, no rash Neuro:  no focal deficits Psych: appropriate affect   Diagnostic Studies Jan 2017 echo Study Conclusions  - Left ventricle: The cavity size was normal. Wall thickness was increased in a pattern of mild LVH. Systolic function was normal. The estimated ejection fraction was in the range of 60% to 65%. Wall motion was normal; there were no regional wall motion abnormalities. Doppler parameters are consistent with abnormal left ventricular relaxation (grade 1 diastolic dysfunction). - Aortic valve: Mildly calcified annulus. - Mitral valve: Calcified annulus. There was mild regurgitation. - Tricuspid valve: There was mild  regurgitation.    Assessment and Plan  1. PSVT - no recent symptoms, continue current meds  2. Chest pain -prior atypical pain most consistent with MSK etiology - no recurrence, continue current meds      Arnoldo Lenis, M.D.

## 2017-09-25 NOTE — Patient Instructions (Signed)

## 2017-09-26 DIAGNOSIS — R262 Difficulty in walking, not elsewhere classified: Secondary | ICD-10-CM | POA: Diagnosis not present

## 2017-09-26 DIAGNOSIS — M6281 Muscle weakness (generalized): Secondary | ICD-10-CM | POA: Diagnosis not present

## 2017-09-26 DIAGNOSIS — M546 Pain in thoracic spine: Secondary | ICD-10-CM | POA: Diagnosis not present

## 2017-09-26 DIAGNOSIS — M545 Low back pain: Secondary | ICD-10-CM | POA: Diagnosis not present

## 2017-09-28 ENCOUNTER — Encounter: Payer: Self-pay | Admitting: Cardiology

## 2017-09-28 DIAGNOSIS — M545 Low back pain: Secondary | ICD-10-CM | POA: Diagnosis not present

## 2017-09-28 DIAGNOSIS — R262 Difficulty in walking, not elsewhere classified: Secondary | ICD-10-CM | POA: Diagnosis not present

## 2017-09-28 DIAGNOSIS — M6281 Muscle weakness (generalized): Secondary | ICD-10-CM | POA: Diagnosis not present

## 2017-09-28 DIAGNOSIS — M546 Pain in thoracic spine: Secondary | ICD-10-CM | POA: Diagnosis not present

## 2017-10-03 DIAGNOSIS — M6281 Muscle weakness (generalized): Secondary | ICD-10-CM | POA: Diagnosis not present

## 2017-10-03 DIAGNOSIS — M545 Low back pain: Secondary | ICD-10-CM | POA: Diagnosis not present

## 2017-10-03 DIAGNOSIS — M546 Pain in thoracic spine: Secondary | ICD-10-CM | POA: Diagnosis not present

## 2017-10-03 DIAGNOSIS — R262 Difficulty in walking, not elsewhere classified: Secondary | ICD-10-CM | POA: Diagnosis not present

## 2017-10-05 DIAGNOSIS — M545 Low back pain: Secondary | ICD-10-CM | POA: Diagnosis not present

## 2017-10-05 DIAGNOSIS — R262 Difficulty in walking, not elsewhere classified: Secondary | ICD-10-CM | POA: Diagnosis not present

## 2017-10-05 DIAGNOSIS — M6281 Muscle weakness (generalized): Secondary | ICD-10-CM | POA: Diagnosis not present

## 2017-10-05 DIAGNOSIS — M546 Pain in thoracic spine: Secondary | ICD-10-CM | POA: Diagnosis not present

## 2017-10-10 DIAGNOSIS — M546 Pain in thoracic spine: Secondary | ICD-10-CM | POA: Diagnosis not present

## 2017-10-10 DIAGNOSIS — M545 Low back pain: Secondary | ICD-10-CM | POA: Diagnosis not present

## 2017-10-10 DIAGNOSIS — M6281 Muscle weakness (generalized): Secondary | ICD-10-CM | POA: Diagnosis not present

## 2017-10-10 DIAGNOSIS — R262 Difficulty in walking, not elsewhere classified: Secondary | ICD-10-CM | POA: Diagnosis not present

## 2017-10-12 DIAGNOSIS — M6281 Muscle weakness (generalized): Secondary | ICD-10-CM | POA: Diagnosis not present

## 2017-10-12 DIAGNOSIS — R262 Difficulty in walking, not elsewhere classified: Secondary | ICD-10-CM | POA: Diagnosis not present

## 2017-10-12 DIAGNOSIS — M546 Pain in thoracic spine: Secondary | ICD-10-CM | POA: Diagnosis not present

## 2017-10-12 DIAGNOSIS — M545 Low back pain: Secondary | ICD-10-CM | POA: Diagnosis not present

## 2017-10-17 DIAGNOSIS — M545 Low back pain: Secondary | ICD-10-CM | POA: Diagnosis not present

## 2017-10-17 DIAGNOSIS — M546 Pain in thoracic spine: Secondary | ICD-10-CM | POA: Diagnosis not present

## 2017-10-17 DIAGNOSIS — M6281 Muscle weakness (generalized): Secondary | ICD-10-CM | POA: Diagnosis not present

## 2017-10-17 DIAGNOSIS — R262 Difficulty in walking, not elsewhere classified: Secondary | ICD-10-CM | POA: Diagnosis not present

## 2017-10-18 DIAGNOSIS — M545 Low back pain: Secondary | ICD-10-CM | POA: Diagnosis not present

## 2017-10-18 DIAGNOSIS — M546 Pain in thoracic spine: Secondary | ICD-10-CM | POA: Diagnosis not present

## 2017-10-18 DIAGNOSIS — M6281 Muscle weakness (generalized): Secondary | ICD-10-CM | POA: Diagnosis not present

## 2017-10-18 DIAGNOSIS — R262 Difficulty in walking, not elsewhere classified: Secondary | ICD-10-CM | POA: Diagnosis not present

## 2017-10-23 DIAGNOSIS — R262 Difficulty in walking, not elsewhere classified: Secondary | ICD-10-CM | POA: Diagnosis not present

## 2017-10-23 DIAGNOSIS — M545 Low back pain: Secondary | ICD-10-CM | POA: Diagnosis not present

## 2017-10-23 DIAGNOSIS — M546 Pain in thoracic spine: Secondary | ICD-10-CM | POA: Diagnosis not present

## 2017-10-23 DIAGNOSIS — M6281 Muscle weakness (generalized): Secondary | ICD-10-CM | POA: Diagnosis not present

## 2017-10-24 DIAGNOSIS — M4150 Other secondary scoliosis, site unspecified: Secondary | ICD-10-CM | POA: Diagnosis not present

## 2017-10-24 DIAGNOSIS — M5106 Intervertebral disc disorders with myelopathy, lumbar region: Secondary | ICD-10-CM | POA: Diagnosis not present

## 2017-10-24 DIAGNOSIS — Z6836 Body mass index (BMI) 36.0-36.9, adult: Secondary | ICD-10-CM | POA: Diagnosis not present

## 2017-10-24 DIAGNOSIS — M545 Low back pain: Secondary | ICD-10-CM | POA: Diagnosis not present

## 2017-10-24 DIAGNOSIS — Z981 Arthrodesis status: Secondary | ICD-10-CM | POA: Diagnosis not present

## 2017-10-25 DIAGNOSIS — M546 Pain in thoracic spine: Secondary | ICD-10-CM | POA: Diagnosis not present

## 2017-10-25 DIAGNOSIS — R262 Difficulty in walking, not elsewhere classified: Secondary | ICD-10-CM | POA: Diagnosis not present

## 2017-10-25 DIAGNOSIS — M6281 Muscle weakness (generalized): Secondary | ICD-10-CM | POA: Diagnosis not present

## 2017-10-25 DIAGNOSIS — M545 Low back pain: Secondary | ICD-10-CM | POA: Diagnosis not present

## 2017-10-30 DIAGNOSIS — R262 Difficulty in walking, not elsewhere classified: Secondary | ICD-10-CM | POA: Diagnosis not present

## 2017-10-30 DIAGNOSIS — M545 Low back pain: Secondary | ICD-10-CM | POA: Diagnosis not present

## 2017-10-30 DIAGNOSIS — M546 Pain in thoracic spine: Secondary | ICD-10-CM | POA: Diagnosis not present

## 2017-10-30 DIAGNOSIS — M6281 Muscle weakness (generalized): Secondary | ICD-10-CM | POA: Diagnosis not present

## 2017-11-01 DIAGNOSIS — M545 Low back pain: Secondary | ICD-10-CM | POA: Diagnosis not present

## 2017-11-01 DIAGNOSIS — M546 Pain in thoracic spine: Secondary | ICD-10-CM | POA: Diagnosis not present

## 2017-11-01 DIAGNOSIS — M6281 Muscle weakness (generalized): Secondary | ICD-10-CM | POA: Diagnosis not present

## 2017-11-01 DIAGNOSIS — R262 Difficulty in walking, not elsewhere classified: Secondary | ICD-10-CM | POA: Diagnosis not present

## 2017-11-07 DIAGNOSIS — M546 Pain in thoracic spine: Secondary | ICD-10-CM | POA: Diagnosis not present

## 2017-11-07 DIAGNOSIS — R262 Difficulty in walking, not elsewhere classified: Secondary | ICD-10-CM | POA: Diagnosis not present

## 2017-11-07 DIAGNOSIS — M6281 Muscle weakness (generalized): Secondary | ICD-10-CM | POA: Diagnosis not present

## 2017-11-07 DIAGNOSIS — M545 Low back pain: Secondary | ICD-10-CM | POA: Diagnosis not present

## 2017-11-08 DIAGNOSIS — M6281 Muscle weakness (generalized): Secondary | ICD-10-CM | POA: Diagnosis not present

## 2017-11-08 DIAGNOSIS — M546 Pain in thoracic spine: Secondary | ICD-10-CM | POA: Diagnosis not present

## 2017-11-08 DIAGNOSIS — M545 Low back pain: Secondary | ICD-10-CM | POA: Diagnosis not present

## 2017-11-08 DIAGNOSIS — R262 Difficulty in walking, not elsewhere classified: Secondary | ICD-10-CM | POA: Diagnosis not present

## 2017-11-13 DIAGNOSIS — R262 Difficulty in walking, not elsewhere classified: Secondary | ICD-10-CM | POA: Diagnosis not present

## 2017-11-13 DIAGNOSIS — M6281 Muscle weakness (generalized): Secondary | ICD-10-CM | POA: Diagnosis not present

## 2017-11-13 DIAGNOSIS — M545 Low back pain: Secondary | ICD-10-CM | POA: Diagnosis not present

## 2017-11-13 DIAGNOSIS — M546 Pain in thoracic spine: Secondary | ICD-10-CM | POA: Diagnosis not present

## 2017-11-19 ENCOUNTER — Encounter (INDEPENDENT_AMBULATORY_CARE_PROVIDER_SITE_OTHER): Payer: Self-pay | Admitting: *Deleted

## 2017-12-17 ENCOUNTER — Other Ambulatory Visit (INDEPENDENT_AMBULATORY_CARE_PROVIDER_SITE_OTHER): Payer: Self-pay | Admitting: *Deleted

## 2017-12-17 ENCOUNTER — Encounter (INDEPENDENT_AMBULATORY_CARE_PROVIDER_SITE_OTHER): Payer: Self-pay | Admitting: *Deleted

## 2017-12-17 DIAGNOSIS — Z86010 Personal history of colonic polyps: Secondary | ICD-10-CM

## 2018-01-10 ENCOUNTER — Other Ambulatory Visit: Payer: Self-pay

## 2018-01-10 ENCOUNTER — Emergency Department (HOSPITAL_COMMUNITY): Payer: Medicare Other

## 2018-01-10 ENCOUNTER — Emergency Department (HOSPITAL_COMMUNITY)
Admission: EM | Admit: 2018-01-10 | Discharge: 2018-01-10 | Disposition: A | Discharge status: Home / Self Care | Payer: Medicare Other | Attending: Emergency Medicine | Admitting: Emergency Medicine

## 2018-01-10 ENCOUNTER — Encounter (HOSPITAL_COMMUNITY): Payer: Self-pay

## 2018-01-10 DIAGNOSIS — M62830 Muscle spasm of back: Secondary | ICD-10-CM | POA: Diagnosis not present

## 2018-01-10 DIAGNOSIS — R0789 Other chest pain: Secondary | ICD-10-CM | POA: Diagnosis not present

## 2018-01-10 DIAGNOSIS — Z79899 Other long term (current) drug therapy: Secondary | ICD-10-CM | POA: Diagnosis not present

## 2018-01-10 DIAGNOSIS — I10 Essential (primary) hypertension: Secondary | ICD-10-CM | POA: Diagnosis not present

## 2018-01-10 DIAGNOSIS — R079 Chest pain, unspecified: Secondary | ICD-10-CM | POA: Diagnosis not present

## 2018-01-10 DIAGNOSIS — E039 Hypothyroidism, unspecified: Secondary | ICD-10-CM | POA: Diagnosis not present

## 2018-01-10 HISTORY — DX: Supraventricular tachycardia: I47.1

## 2018-01-10 HISTORY — DX: Supraventricular tachycardia, unspecified: I47.10

## 2018-01-10 LAB — CBC
HCT: 46 % (ref 36.0–46.0)
HEMOGLOBIN: 14.6 g/dL (ref 12.0–15.0)
MCH: 29.1 pg (ref 26.0–34.0)
MCHC: 31.7 g/dL (ref 30.0–36.0)
MCV: 91.8 fL (ref 78.0–100.0)
Platelets: 221 10*3/uL (ref 150–400)
RBC: 5.01 MIL/uL (ref 3.87–5.11)
RDW: 14.8 % (ref 11.5–15.5)
WBC: 6.6 10*3/uL (ref 4.0–10.5)

## 2018-01-10 LAB — BASIC METABOLIC PANEL
ANION GAP: 12 (ref 5–15)
BUN: 28 mg/dL — ABNORMAL HIGH (ref 6–20)
CHLORIDE: 103 mmol/L (ref 101–111)
CO2: 25 mmol/L (ref 22–32)
Calcium: 9.4 mg/dL (ref 8.9–10.3)
Creatinine, Ser: 1.02 mg/dL — ABNORMAL HIGH (ref 0.44–1.00)
GFR calc non Af Amer: 54 mL/min — ABNORMAL LOW (ref >60)
Glucose, Bld: 108 mg/dL — ABNORMAL HIGH (ref 65–99)
Potassium: 4.1 mmol/L (ref 3.5–5.1)
Sodium: 140 mmol/L (ref 135–145)

## 2018-01-10 LAB — I-STAT TROPONIN, ED: Troponin i, poc: 0 ng/mL (ref 0.00–0.08)

## 2018-01-10 IMAGING — DX DG CHEST 2V
2 series · 2 of 2 positions shown · non-contrast
Comparison: [DATE]

CLINICAL DATA: Chest pain

EXAM:
CHEST - 2 VIEW

[chest pa]
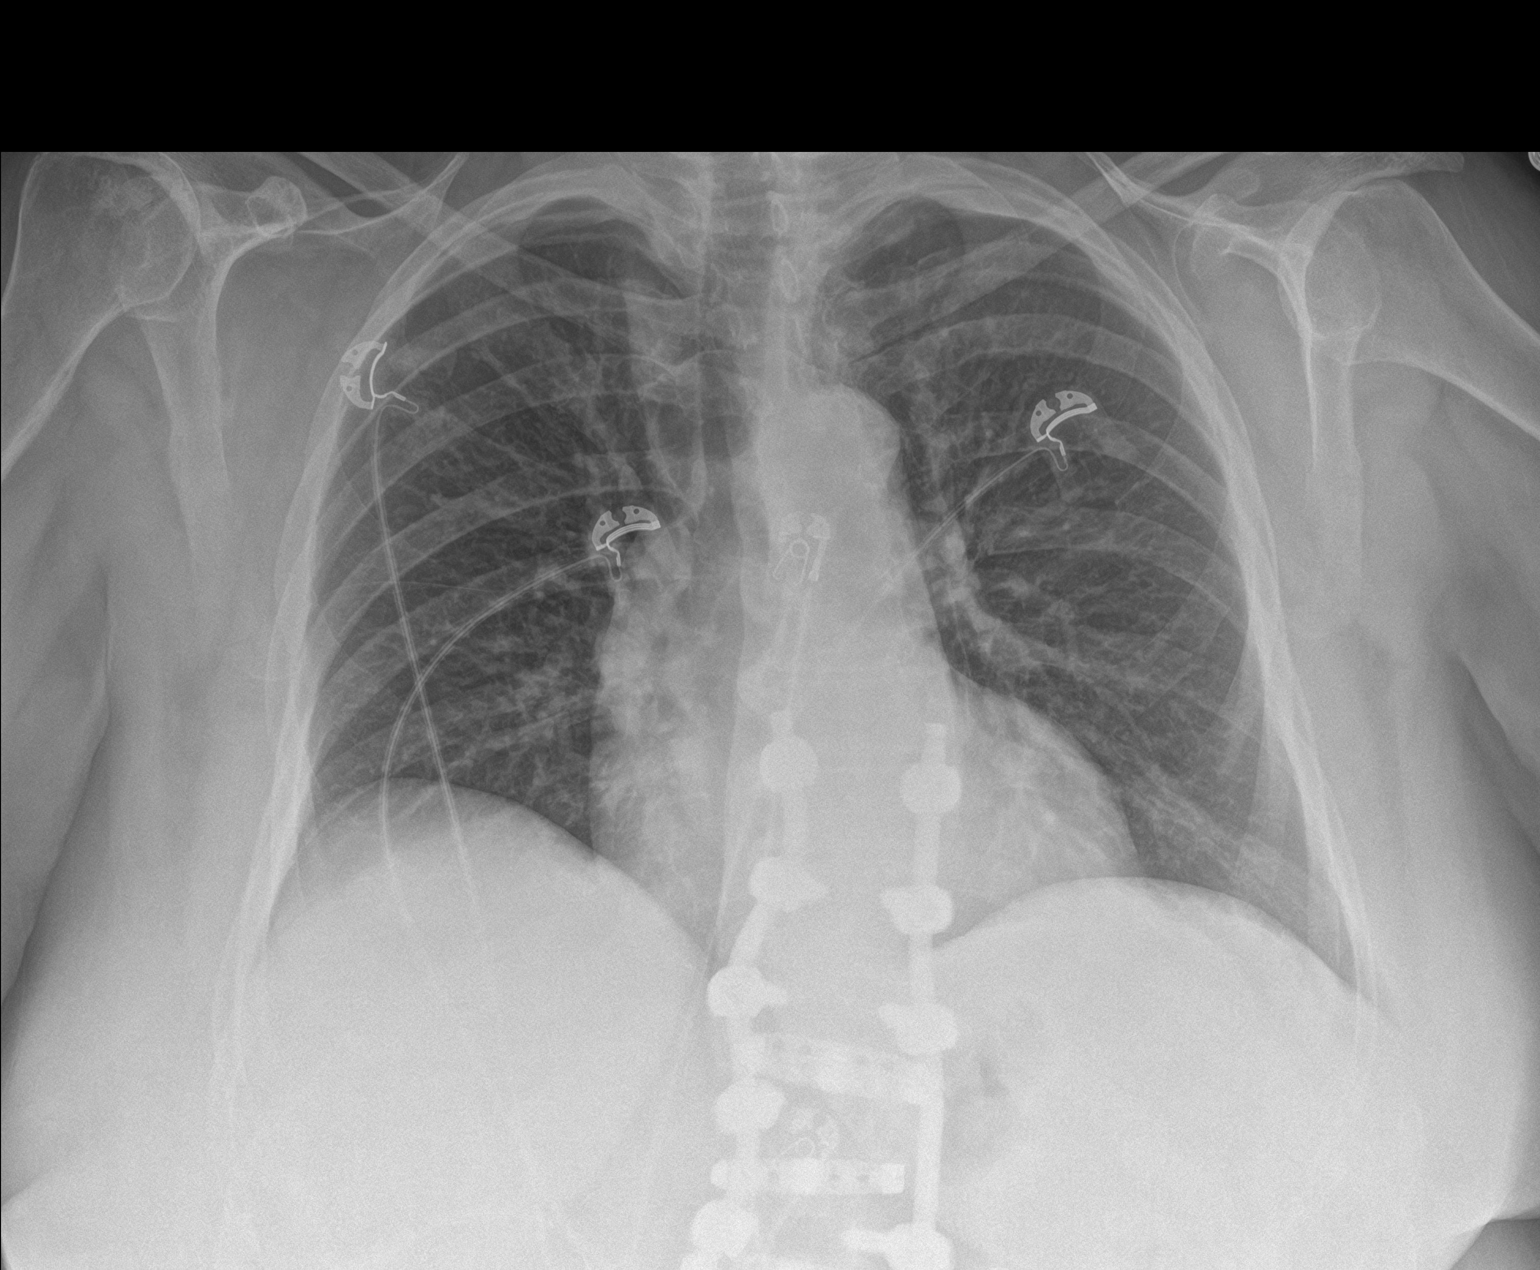

[chest lat]
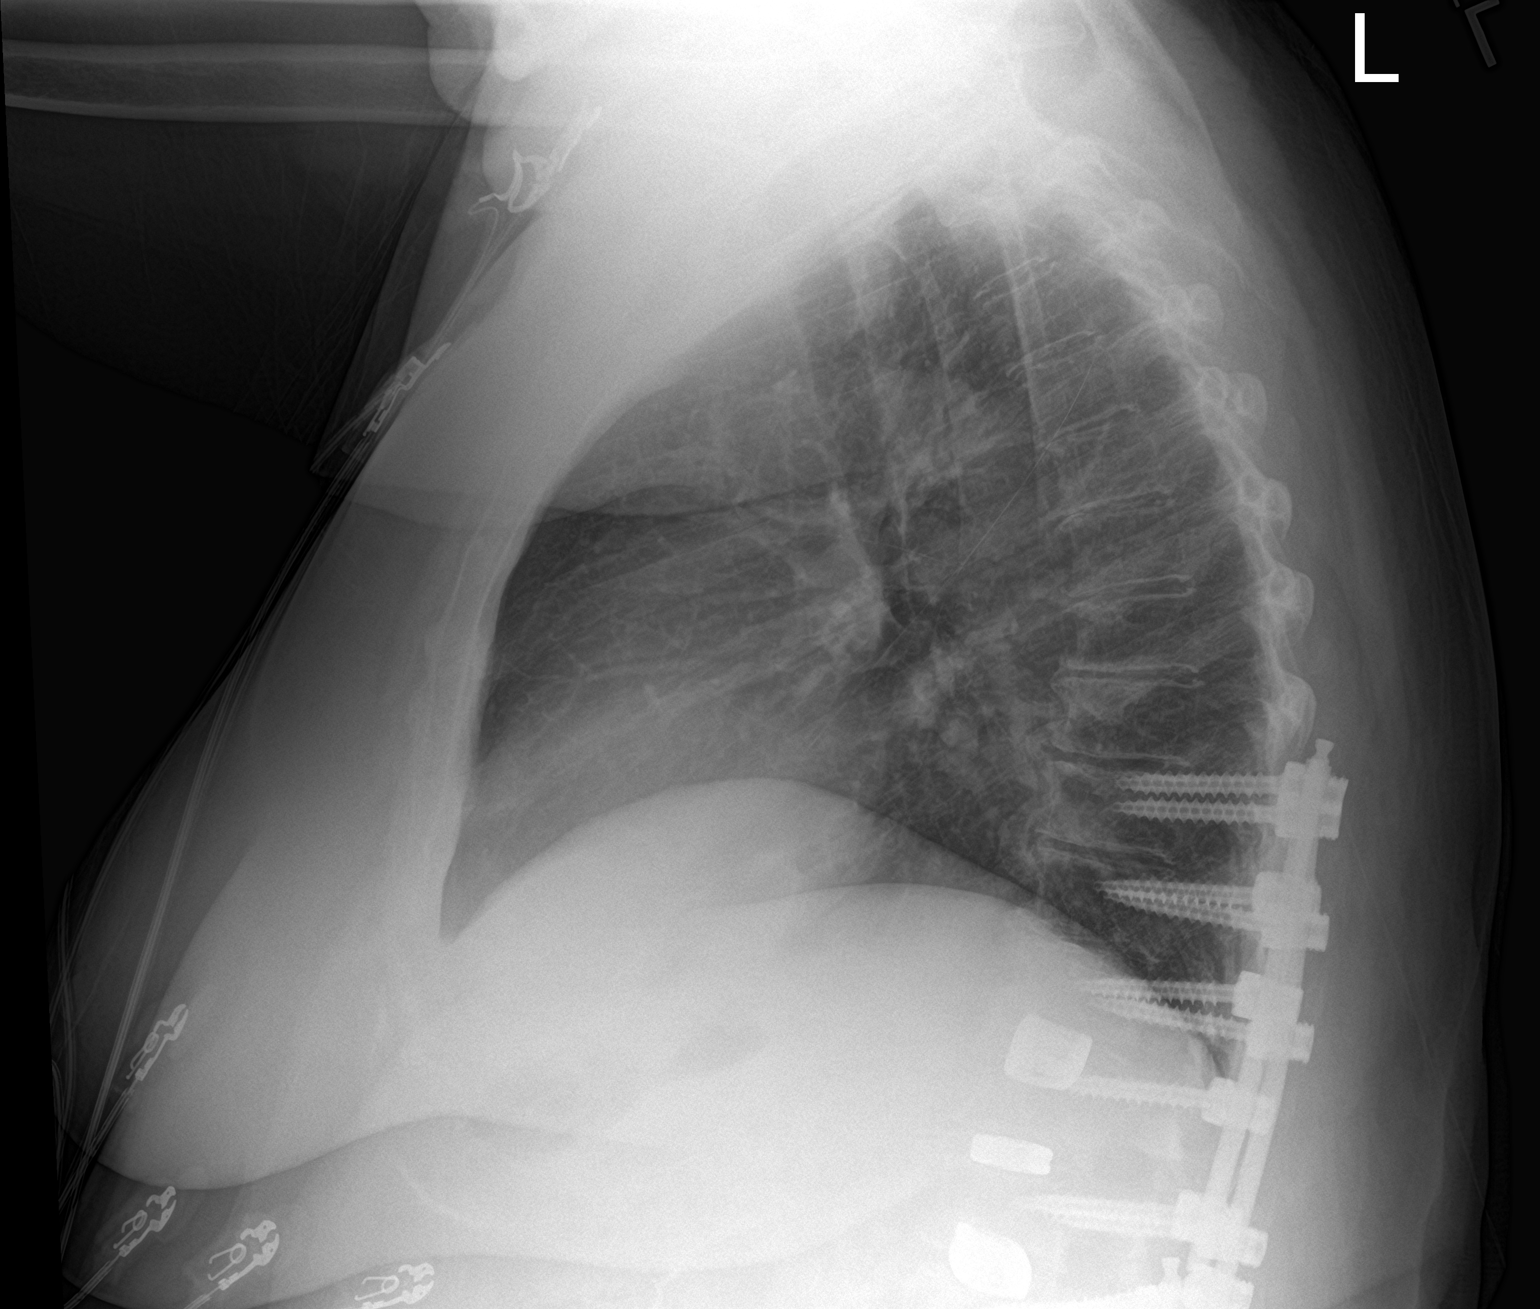

[2 of 2 positions shown; findings below may reference images not displayed]

FINDINGS: Heart and mediastinal contours are within normal limits. No focal
opacities or effusions. No acute bony abnormality.
IMPRESSION: No active cardiopulmonary disease.

## 2018-01-10 MED ORDER — METHOCARBAMOL 500 MG PO TABS
500.0000 mg | ORAL_TABLET | Freq: Once | ORAL | Status: AC
  Administered 2018-01-10: 500 mg via ORAL
  Filled 2018-01-10: qty 1

## 2018-01-10 MED ORDER — MORPHINE SULFATE (PF) 4 MG/ML IV SOLN
4.0000 mg | Freq: Once | INTRAVENOUS | Status: AC
  Administered 2018-01-10: 4 mg via INTRAMUSCULAR
  Filled 2018-01-10: qty 1

## 2018-01-10 MED ORDER — ACETAMINOPHEN 325 MG PO TABS
650.0000 mg | ORAL_TABLET | ORAL | Status: DC | PRN
  Administered 2018-01-10: 650 mg via ORAL
  Filled 2018-01-10: qty 2

## 2018-01-10 NOTE — Discharge Instructions (Addendum)
Follow-up with your primary physician regarding chronic pain management.  You may also apply heat to help with muscle spasm.

## 2018-01-10 NOTE — ED Triage Notes (Signed)
Pt c/o pain in left shoulder blade that radiates through her chest and down left arm.  Reports pain started in shoulder blade 3 days ago but moved to chest yesterday.  Denies any sob, n/v.

## 2018-01-10 NOTE — ED Provider Notes (Signed)
[facility] EMERGENCY DEPARTMENT Provider Note   CSN: [id number] Arrival date & time: 01/10/18  0757     History   Chief Complaint Chief Complaint  Patient presents with  . Chest Pain    HPI Natasha Warren is a 72 y.o. female.  HPI Patient presents with 3 days of left-sided thoracic back pain that has gradually moved to the left upper arm and left side of the chest.  States she does have occasional sharp chest pain associated with baseline dull pain.  Pain is worse with movement and deep breathing.  She said no shortness of breath, fever or chills.  Denies cough.  No known heavy lifting or abnormal exertion.  No new lower extremity swelling or pain. Past Medical History:  Diagnosis Date  . Arthritis   . Bulging lumbar disc   . Colon polyps   . History of gout   . History of gout   . Hyperlipidemia   . Hyperparathyroidism, primary ([place])   . Hypertension   . Hypothyroidism   . Irritable bowel syndrome   . Nocturia   . Renal disorder    kidney stones  . Seasonal allergies   . SVT (supraventricular tachycardia) ([place])   . Thyroid disorder   . Urolithiasis     Patient Active Problem List   Diagnosis Date Noted  . History of colonic polyps 05/29/2016  . Hydronephrosis with renal and ureteral calculus obstruction 01/07/2015  . Acute kidney injury ([place]) 10/06/2014  . Febrile urinary tract infection 10/03/2014  . Left ureteral calculus 10/03/2014  . Unspecified vitamin D deficiency 10/22/2013  . Nephrolithiasis 03/10/2013  . Hyperparathyroidism s/p left superior parathyroidectomy 03/10/2013    Past Surgical History:  Procedure Laterality Date  . ABDOMINAL HYSTERECTOMY  1986  . APPENDECTOMY  1954  . BACK SURGERY  09/2016  . bladder tack  09/1984  . CHOLECYSTECTOMY  01/1999  . COLONOSCOPY  06/2006   This was her second one  . COLONOSCOPY  07/12/2011   Procedure: COLONOSCOPY;  Surgeon: Rogene Houston, MD;  [facility];  Service: Endoscopy;   Laterality: N/A;  10:30 am  . CYSTOSCOPY W/ URETERAL STENT PLACEMENT Left 10/03/2014   Procedure: CYSTOSCOPY WITH URETERAL STENT PLACEMENT;  Surgeon: Malka So, MD;  [facility]: [facility];  Service: Urology;  Laterality: Left;  . CYSTOSCOPY WITH URETEROSCOPY AND STENT PLACEMENT Left 03/16/2015   Procedure: CYSTOSCOPY WITH LEFT STENT PLACEMENT;  Surgeon: Franchot Gallo, MD;  Location: [facility];  Service: Urology;  Laterality: Left;  . gout removal  2017  . HOLMIUM LASER APPLICATION Left 1/63/8453   Procedure: HOLMIUM LASER APPLICATION;  Surgeon: Franchot Gallo, MD;  [facility];  Service: Urology;  Laterality: Left;  . KNEE ARTHROSCOPY  12/2010   Left knee  . LITHOTRIPSY  06/1995  . LUMBAR FUSION  06/2016  . NEPHROLITHOTOMY Left 01/07/2015   Procedure: NEPHROLITHOTOMY PERCUTANEOUS;  Surgeon: Franchot Gallo, MD;  [facility];  Service: Urology;  Laterality: Left;  With STENT  . Rotor Cuff  2011   Right Shoulder  . STENT REMOVAL Left 03/16/2015   Procedure: LEFT URETERAL STENT REMOVAL;  Surgeon: Franchot Gallo, MD;  Location: [facility];  Service: Urology;  Laterality: Left;  . THYROID EXPLORATION N/A 07/31/2013   Procedure: neck EXPLORATION;  Surgeon: Odis Hollingshead, MD;  [facility];  Service: General;  Laterality: N/A;  . THYROIDECTOMY N/A 07/31/2013   Procedure: PARATHYROIDECTOMY;  Surgeon: Odis Hollingshead, MD;  [facility];  Service: General;  Laterality: N/A;  . TUMOR REMOVAL  06/1997   Beign; on back x2 surgeries  . TUMOR REMOVAL  2002   "FATTY TUMORS" REMOVED FROM BACK  . URETEROSCOPY WITH HOLMIUM LASER LITHOTRIPSY Left 03/16/2015   Procedure: LEFT URETEROSCOPIC LASER LITHOTRIPSY WITH STONE EXTRACTION;  Surgeon: Franchot Gallo, MD;  Location: [facility];  Service: Urology;  Laterality: Left;     OB History   None      Home Medications    Prior to Admission medications   Medication Sig Start Date End Date Taking? Authorizing Provider  acetaminophen  (TYLENOL) 325 MG tablet Take 650 mg by mouth every 6 (six) hours as needed.    [provider]  allopurinol (ZYLOPRIM) 300 MG tablet Take 300 mg by mouth daily.  05/14/16   [provider]  docusate sodium (COLACE) 100 MG capsule Take 100 mg by mouth 2 (two) times daily.    [provider]  levothyroxine (SYNTHROID, LEVOTHROID) 50 MCG tablet Take 50 mcg by mouth daily before breakfast.     [provider]  LORazepam (ATIVAN) 0.5 MG tablet Take 0.5 mg by mouth at bedtime.    [provider]  losartan (COZAAR) 100 MG tablet Take 100 mg by mouth daily.  03/17/16   [provider]  meloxicam (MOBIC) 7.5 MG tablet Take 1 tablet (7.5 mg total) by mouth daily. 9/32/35   Delora Fuel, MD  metFORMIN (GLUCOPHAGE-XR) 500 MG 24 hr tablet Take 500 mg by mouth daily with breakfast.  05/14/16   [provider]  metoprolol succinate (TOPROL-XL) 50 MG 24 hr tablet Take 1 & 1/2 tablets daily. 09/28/15   Arnoldo Lenis, MD  oxyCODONE-acetaminophen (PERCOCET) 10-325 MG tablet One tablet three times a day as needed for pain.  Must be at least 4 hours apart. (MMD 3/day) 08/03/17   [provider]  tizanidine (ZANAFLEX) 2 MG capsule Take 2 mg by mouth 3 (three) times daily.    [provider]    Family History Family History  Problem Relation Age of Onset  . Hypertension Mother   . Arthritis Mother   . Hypertension Father   . Healthy Brother   . Healthy Daughter   . Healthy Daughter   . Colon cancer Neg Hx     Social History Social History   Tobacco Use  . Smoking status: Never Smoker  . Smokeless tobacco: Never Used  Substance Use Topics  . Alcohol use: Yes    Comment: Very Rarely 1/2 glass of beer  . Drug use: No     Allergies   Ace inhibitors   Review of Systems Review of Systems  Constitutional: Negative for chills and fever.  HENT: Negative for trouble swallowing.   Eyes: Negative for visual disturbance.    Respiratory: Negative for cough, chest tightness and shortness of breath.   Cardiovascular: Positive for chest pain. Negative for palpitations and leg swelling.  Gastrointestinal: Negative for abdominal pain, diarrhea, nausea and vomiting.  Musculoskeletal: Positive for back pain and myalgias. Negative for arthralgias and neck pain.  Skin: Negative for rash and wound.  Neurological: Negative for dizziness, weakness, light-headedness, numbness and headaches.  All other systems reviewed and are negative.    Physical Exam Updated Vital Signs BP (!) 149/87   Pulse 91   Temp 98.3 F (36.8 C) (Oral)   Resp 18   Ht 5\' 3"  (1.6 m)   Wt 86.2 kg (190 lb)   SpO2 100%   BMI  33.66 kg/m   Physical Exam  Constitutional: She is oriented to person, place, and time. She appears well-developed and well-nourished. No distress.  HENT:  Head: Normocephalic and atraumatic.  Mouth/Throat: Oropharynx is clear and moist.  Eyes: Pupils are equal, round, and reactive to light. EOM are normal.  Neck: Normal range of motion. Neck supple.  Cardiovascular: Normal rate and regular rhythm. Exam reveals no gallop and no friction rub.  No murmur heard. Pulmonary/Chest: Effort normal and breath sounds normal. No stridor. No respiratory distress. She has no wheezes. She has no rales. She exhibits tenderness.  Chest wall tenderness with palpation of the left upper chest.  There is no crepitance or deformity.  Abdominal: Soft. Bowel sounds are normal. There is no tenderness. There is no rebound and no guarding.  Musculoskeletal: Normal range of motion. She exhibits tenderness. She exhibits no edema.  Patient has left sided infraspinatus, supraspinatus and trapezius muscle tenderness and spasm.  No midline thoracic or lumbar tenderness.  Full range of motion of the left shoulder without deformity, swelling or warmth.  Patient does have some mild tenderness to palpation over the left tricep.  Full range of motion of the  left elbow without swelling, deformity or warmth.  No extremity asymmetry.  Distal pulses are 2+.  Neurological: She is alert and oriented to person, place, and time.  5/5 motor in all extremities.  Sensation fully intact.  Skin: Skin is warm and dry. Capillary refill takes less than 2 seconds. No rash noted. She is not diaphoretic. No erythema.  Psychiatric: She has a normal mood and affect. Her behavior is normal.  Nursing note and vitals reviewed.    ED Treatments / Results  Labs (all labs ordered are listed, but only abnormal results are displayed) Labs Reviewed  BASIC METABOLIC PANEL - Abnormal; Notable for the following components:      Result Value   Glucose, Bld 108 (*)    BUN 28 (*)    Creatinine, Ser 1.02 (*)    GFR calc non Af Amer 54 (*)    All other components within normal limits  CBC  I-STAT TROPONIN, ED    EKG None  Radiology Dg Chest 2 View  Result Date: 01/10/2018 CLINICAL DATA:  Chest pain EXAM: CHEST - 2 VIEW COMPARISON:  08/22/2017 FINDINGS: Heart and mediastinal contours are within normal limits. No focal opacities or effusions. No acute bony abnormality. IMPRESSION: No active cardiopulmonary disease. Electronically Signed   By: Rolm Baptise M.D.   On: 01/10/2018 09:18    Procedures Procedures (including critical care time)  Medications Ordered in ED Medications  acetaminophen (TYLENOL) tablet 650 mg (650 mg Oral Given 01/10/18 0948)  methocarbamol (ROBAXIN) tablet 500 mg (500 mg Oral Given 01/10/18 0947)  morphine 4 MG/ML injection 4 mg (4 mg Intramuscular Given 01/10/18 1051)     Initial Impression / Assessment and Plan / ED Course  I have reviewed the triage vital signs and the nursing notes.  Pertinent labs & imaging results that were available during my care of the patient were reviewed by me and considered in my medical decision making (see chart for details).     Chest pain most consistent with muscular skeletal.  Low suspicion for coronary  artery disease.  Chest and thoracic back pain are reproduced with palpation.  EKG without evidence of ischemia.  Troponin is normal.  Chest x-ray without acute abnormality.  Patient is already on Percocet, Zanaflex, Mobic.  Will refer back to her primary  physician regarding chronic pain management.  Advised intermittent heat that may help with some of the muscle spasms.  Return precautions have been given. Final Clinical Impressions(s) / ED Diagnoses   Final diagnoses:  Spasm of thoracic back muscle  Chest wall pain    ED Discharge Orders    None       Julianne Rice, MD 01/10/18 1057

## 2018-01-11 DIAGNOSIS — Z6838 Body mass index (BMI) 38.0-38.9, adult: Secondary | ICD-10-CM | POA: Diagnosis not present

## 2018-01-11 DIAGNOSIS — M4807 Spinal stenosis, lumbosacral region: Secondary | ICD-10-CM | POA: Diagnosis not present

## 2018-01-11 DIAGNOSIS — M542 Cervicalgia: Secondary | ICD-10-CM | POA: Diagnosis not present

## 2018-01-14 DIAGNOSIS — M50323 Other cervical disc degeneration at C6-C7 level: Secondary | ICD-10-CM | POA: Diagnosis not present

## 2018-01-14 DIAGNOSIS — F4321 Adjustment disorder with depressed mood: Secondary | ICD-10-CM | POA: Diagnosis not present

## 2018-01-14 DIAGNOSIS — M5031 Other cervical disc degeneration,  high cervical region: Secondary | ICD-10-CM | POA: Diagnosis not present

## 2018-01-14 DIAGNOSIS — M50321 Other cervical disc degeneration at C4-C5 level: Secondary | ICD-10-CM | POA: Diagnosis not present

## 2018-01-14 DIAGNOSIS — M898X1 Other specified disorders of bone, shoulder: Secondary | ICD-10-CM | POA: Diagnosis not present

## 2018-01-14 DIAGNOSIS — M47812 Spondylosis without myelopathy or radiculopathy, cervical region: Secondary | ICD-10-CM | POA: Diagnosis not present

## 2018-01-14 DIAGNOSIS — M50322 Other cervical disc degeneration at C5-C6 level: Secondary | ICD-10-CM | POA: Diagnosis not present

## 2018-01-14 DIAGNOSIS — M50221 Other cervical disc displacement at C4-C5 level: Secondary | ICD-10-CM | POA: Diagnosis not present

## 2018-01-14 DIAGNOSIS — M5106 Intervertebral disc disorders with myelopathy, lumbar region: Secondary | ICD-10-CM | POA: Diagnosis not present

## 2018-01-14 DIAGNOSIS — Z6837 Body mass index (BMI) 37.0-37.9, adult: Secondary | ICD-10-CM | POA: Diagnosis not present

## 2018-01-22 DIAGNOSIS — M47812 Spondylosis without myelopathy or radiculopathy, cervical region: Secondary | ICD-10-CM | POA: Diagnosis not present

## 2018-01-22 DIAGNOSIS — M4722 Other spondylosis with radiculopathy, cervical region: Secondary | ICD-10-CM | POA: Diagnosis not present

## 2018-01-22 DIAGNOSIS — M4802 Spinal stenosis, cervical region: Secondary | ICD-10-CM | POA: Diagnosis not present

## 2018-01-22 DIAGNOSIS — M50123 Cervical disc disorder at C6-C7 level with radiculopathy: Secondary | ICD-10-CM | POA: Diagnosis not present

## 2018-01-22 DIAGNOSIS — M5013 Cervical disc disorder with radiculopathy, cervicothoracic region: Secondary | ICD-10-CM | POA: Diagnosis not present

## 2018-01-22 DIAGNOSIS — M5410 Radiculopathy, site unspecified: Secondary | ICD-10-CM | POA: Diagnosis not present

## 2018-01-25 DIAGNOSIS — I10 Essential (primary) hypertension: Secondary | ICD-10-CM | POA: Diagnosis not present

## 2018-01-25 DIAGNOSIS — F419 Anxiety disorder, unspecified: Secondary | ICD-10-CM | POA: Diagnosis not present

## 2018-01-25 DIAGNOSIS — E039 Hypothyroidism, unspecified: Secondary | ICD-10-CM | POA: Diagnosis not present

## 2018-01-25 DIAGNOSIS — D519 Vitamin B12 deficiency anemia, unspecified: Secondary | ICD-10-CM | POA: Diagnosis not present

## 2018-01-29 DIAGNOSIS — M4807 Spinal stenosis, lumbosacral region: Secondary | ICD-10-CM | POA: Diagnosis not present

## 2018-01-29 DIAGNOSIS — I10 Essential (primary) hypertension: Secondary | ICD-10-CM | POA: Diagnosis not present

## 2018-01-29 DIAGNOSIS — M1000 Idiopathic gout, unspecified site: Secondary | ICD-10-CM | POA: Diagnosis not present

## 2018-01-29 DIAGNOSIS — M542 Cervicalgia: Secondary | ICD-10-CM | POA: Diagnosis not present

## 2018-01-29 DIAGNOSIS — Z0001 Encounter for general adult medical examination with abnormal findings: Secondary | ICD-10-CM | POA: Diagnosis not present

## 2018-01-29 DIAGNOSIS — Z6838 Body mass index (BMI) 38.0-38.9, adult: Secondary | ICD-10-CM | POA: Diagnosis not present

## 2018-01-29 DIAGNOSIS — E039 Hypothyroidism, unspecified: Secondary | ICD-10-CM | POA: Diagnosis not present

## 2018-01-29 DIAGNOSIS — F419 Anxiety disorder, unspecified: Secondary | ICD-10-CM | POA: Diagnosis not present

## 2018-01-30 DIAGNOSIS — M4802 Spinal stenosis, cervical region: Secondary | ICD-10-CM | POA: Diagnosis not present

## 2018-01-30 DIAGNOSIS — Z6837 Body mass index (BMI) 37.0-37.9, adult: Secondary | ICD-10-CM | POA: Diagnosis not present

## 2018-01-30 DIAGNOSIS — M47812 Spondylosis without myelopathy or radiculopathy, cervical region: Secondary | ICD-10-CM | POA: Diagnosis not present

## 2018-02-18 ENCOUNTER — Encounter (INDEPENDENT_AMBULATORY_CARE_PROVIDER_SITE_OTHER): Payer: Self-pay | Admitting: *Deleted

## 2018-02-18 ENCOUNTER — Telehealth (INDEPENDENT_AMBULATORY_CARE_PROVIDER_SITE_OTHER): Payer: Self-pay | Admitting: *Deleted

## 2018-02-18 MED ORDER — PEG 3350-KCL-NA BICARB-NACL 420 G PO SOLR: 4000.0000 mL | Freq: Once | ORAL | 0 refills | Status: AC

## 2018-02-18 NOTE — Telephone Encounter (Signed)
Patient needs trilyte 

## 2018-03-06 ENCOUNTER — Telehealth (INDEPENDENT_AMBULATORY_CARE_PROVIDER_SITE_OTHER): Payer: Self-pay | Admitting: *Deleted

## 2018-03-06 DIAGNOSIS — M4150 Other secondary scoliosis, site unspecified: Secondary | ICD-10-CM | POA: Diagnosis not present

## 2018-03-06 DIAGNOSIS — Z6837 Body mass index (BMI) 37.0-37.9, adult: Secondary | ICD-10-CM | POA: Diagnosis not present

## 2018-03-06 DIAGNOSIS — M47812 Spondylosis without myelopathy or radiculopathy, cervical region: Secondary | ICD-10-CM | POA: Diagnosis not present

## 2018-03-06 NOTE — Telephone Encounter (Signed)
agree

## 2018-03-06 NOTE — Telephone Encounter (Signed)
Referring MD/PCP: howard   Procedure: tcs  Reason/Indication:  Hx polyps  Has patient had this procedure before?  Yes, 2007  If so, when, by whom and where?    Is there a family history of colon cancer?  no  Who?  What age when diagnosed?    Is patient diabetic?   yes      Does patient have prosthetic heart valve or mechanical valve?  no  Do you have a pacemaker?  no  Has patient ever had endocarditis? no  Has patient had joint replacement within last 12 months?  no  Is patient constipated or do they take laxatives? yes  Does patient have a history of alcohol/drug use?  no  Is patient on blood thinner such as Coumadin, Plavix and/or Aspirin? no  Medications: levothyroxine 50 mcg daily, metoprolol 75 mg daily, metformin 500 mg daily, losartan 100 mg daily, allopurinol 300 mg daily, lorazepam 0.5 mg at bedtime, meloxicam 7.5 mg daily, vit c, vit d, stool softener  Allergies: nkda  Medication Adjustment per Dr Lindi Adie, NP: hold metformin morning of procedure  Procedure date & time: 04/03/18 at 830

## 2018-03-11 DIAGNOSIS — Z1231 Encounter for screening mammogram for malignant neoplasm of breast: Secondary | ICD-10-CM | POA: Diagnosis not present

## 2018-03-19 ENCOUNTER — Other Ambulatory Visit: Payer: Self-pay | Admitting: Urology

## 2018-03-19 ENCOUNTER — Ambulatory Visit (HOSPITAL_COMMUNITY)
Admission: RE | Admit: 2018-03-19 | Discharge: 2018-03-19 | Disposition: A | Discharge status: Home / Self Care | Payer: Medicare Other | Source: Ambulatory Visit | Attending: Urology | Admitting: Urology

## 2018-03-19 DIAGNOSIS — N200 Calculus of kidney: Secondary | ICD-10-CM

## 2018-03-19 IMAGING — DX DG ABDOMEN 1V
2 series · 2 of 2 positions shown · non-contrast
Comparison: Lumbar spine radiographs dated [DATE]. Abdomen and
pelvis CT dated [DATE].

CLINICAL DATA: Follow-up left kidney stones.

EXAM:
ABDOMEN - 1 VIEW

[abdomen kub (1 of 2)]
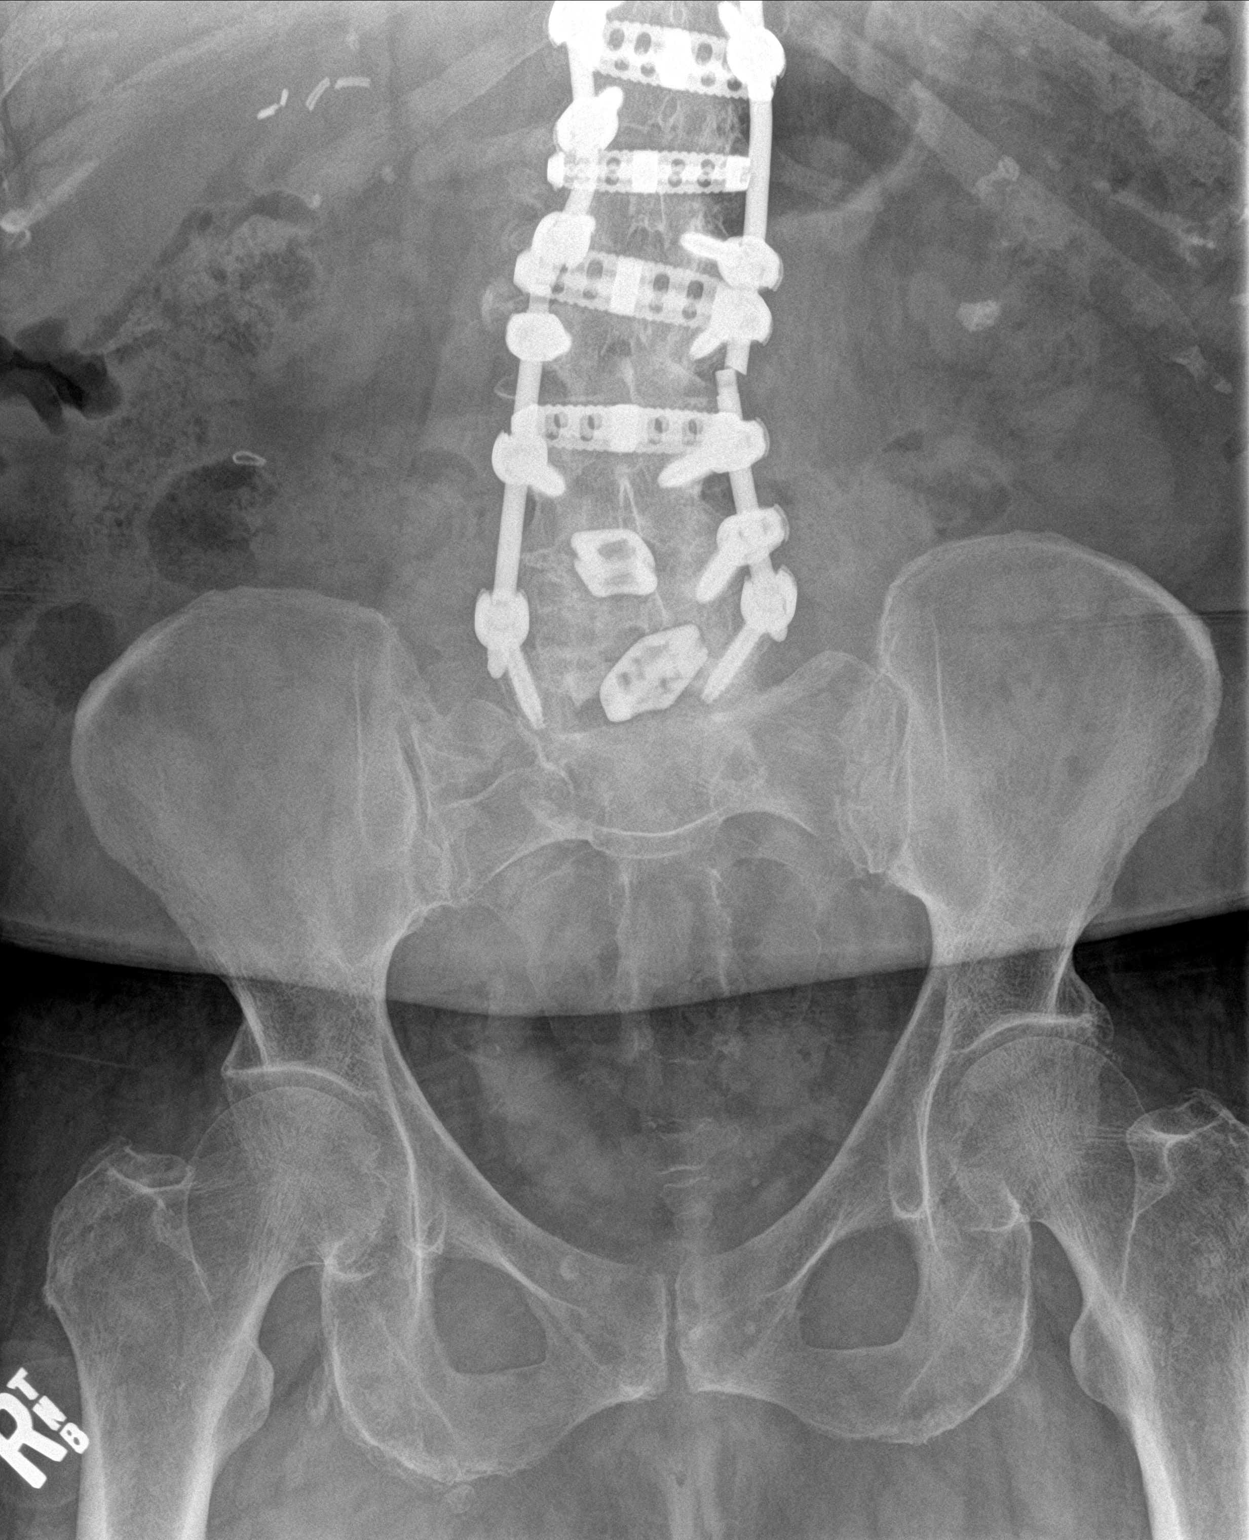

[abdomen kub (2 of 2)]
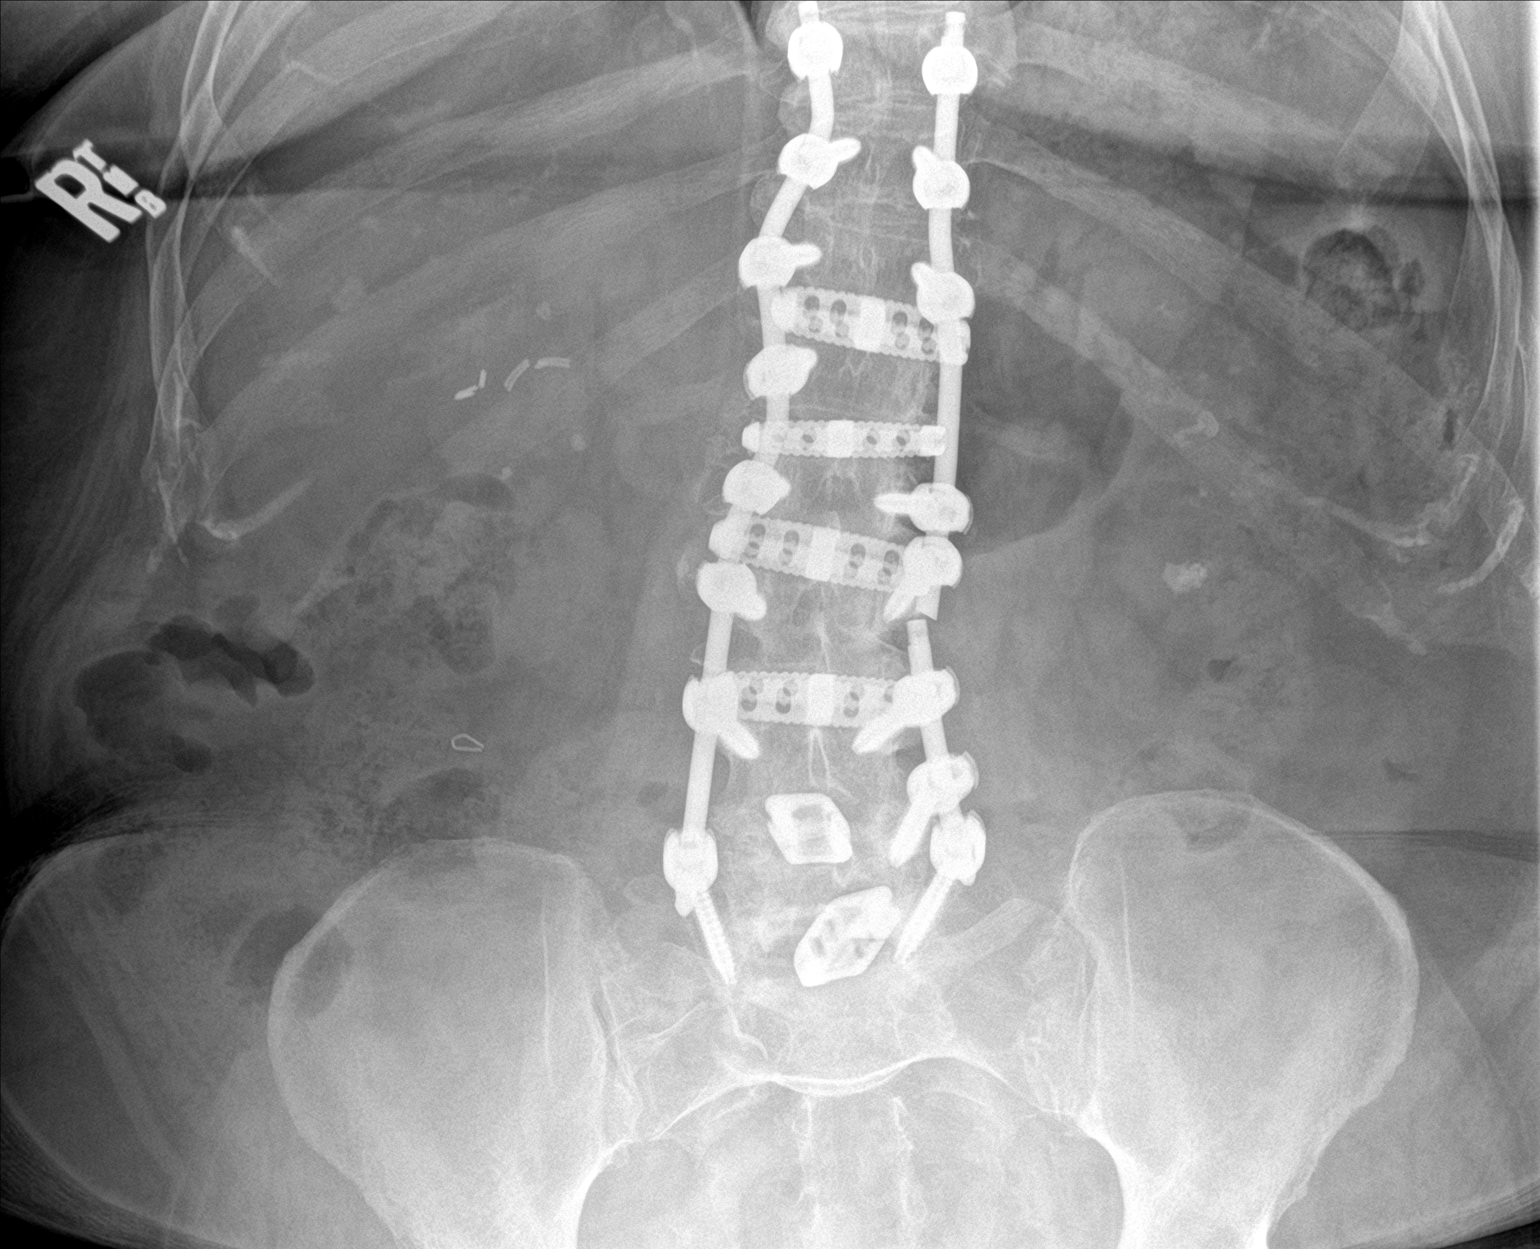

[2 of 2 positions shown; findings below may reference images not displayed]

FINDINGS: No significant change in previously demonstrated 2 left renal
calculi, 1 measuring 1.3 cm in the other measuring 1.1 cm in maximum
diameter. There are 3 small right renal calculi, each measuring 4 mm
in maximum diameter. Small bilateral pelvic phleboliths. No visible
ureteral calculi. Normal bowel gas pattern. Stable lumbar spine
fixation hardware and right abdominal surgical clips.
IMPRESSION: No significant change in bilateral renal calculi.

## 2018-03-26 ENCOUNTER — Ambulatory Visit (INDEPENDENT_AMBULATORY_CARE_PROVIDER_SITE_OTHER): Payer: Medicare Other | Admitting: Urology

## 2018-03-26 DIAGNOSIS — N200 Calculus of kidney: Secondary | ICD-10-CM

## 2018-04-01 ENCOUNTER — Encounter: Payer: Self-pay | Admitting: *Deleted

## 2018-04-01 ENCOUNTER — Encounter: Payer: Self-pay | Admitting: Cardiology

## 2018-04-01 ENCOUNTER — Ambulatory Visit (INDEPENDENT_AMBULATORY_CARE_PROVIDER_SITE_OTHER): Payer: Medicare Other | Admitting: Cardiology

## 2018-04-01 VITALS — BP 133/81 | HR 76 | Ht 63.0 in | Wt 213.8 lb

## 2018-04-01 DIAGNOSIS — I471 Supraventricular tachycardia: Secondary | ICD-10-CM

## 2018-04-01 DIAGNOSIS — R0789 Other chest pain: Secondary | ICD-10-CM

## 2018-04-01 NOTE — Patient Instructions (Signed)

## 2018-04-01 NOTE — Progress Notes (Signed)
Clinical Summary Ms. Hacker is a 72 y.o.female seen today for follow up of the following medical problems.   1. PSVT - no recent palpitations. She has done well with beta blocker without any significant recurrence.     2. Chest pain/MSK pain - history of inermittentchest dull pain 2-3/10. At rest or with exertion, often with laying down. Can occur after meals. Lasts approx 15-20 minutes. No SOB or palpitations. Ongoing for 1 year, symptoms unchanged since last visit - no recent symptoms.   - seen in ER 03/29/17 with chest pain, primarily left scapular pain radiating into chest. From notes thought to be MSK pain.EKG and cardiac enzymes unremarkable. Discharged with meloxicam.  - no recent chest pain since last visit.    01-13-2018 ER visit with chest pain. Notes indicate worst with palpation. EKG nonacute, enzymes negative. Symptoms typical of her prior left chest/shoulder blade pain.  - followed up with Dr Carloyn Manner, had MRI showed bone spurs in shoulder blade - symptoms have resolved.     SH: Her husband KEYLEE SHRESTHA is also a patient of mine, he recently passed away 13-Jan-2017.    Past Medical History:  Diagnosis Date  . Arthritis   . Bulging lumbar disc   . Colon polyps   . History of gout   . History of gout   . Hyperlipidemia   . Hyperparathyroidism, primary ([place])   . Hypertension   . Hypothyroidism   . Irritable bowel syndrome   . Nocturia   . Renal disorder    kidney stones  . Seasonal allergies   . SVT (supraventricular tachycardia) ([place])   . Thyroid disorder   . Urolithiasis      Allergies  Allergen Reactions  . Ace Inhibitors Other (See Comments)    Patient doesn't remember this allergy.  On 12/30/2014 received LOV visit from PCP , Dr Rory Percy in [place], [place].  - No known active drug allergies listed in his office visit note dated 05/21/2014.    Marland Kitchen Venlafaxine Anxiety     Current Outpatient Medications  Medication Sig Dispense Refill  .  acetaminophen (TYLENOL) 500 MG tablet Take 1,000 mg by mouth every 6 (six) hours as needed for moderate pain or headache.     . allopurinol (ZYLOPRIM) 300 MG tablet Take 300 mg by mouth daily.   5  . Cholecalciferol (VITAMIN D3) 1000 units CAPS Take 1,000 Units by mouth daily.    Marland Kitchen docusate sodium (COLACE) 50 MG capsule Take 50 mg by mouth daily.     Marland Kitchen gabapentin (NEURONTIN) 300 MG capsule Take 300 mg by mouth at bedtime.    Marland Kitchen levothyroxine (SYNTHROID, LEVOTHROID) 50 MCG tablet Take 50 mcg by mouth daily before breakfast.     . losartan (COZAAR) 100 MG tablet Take 100 mg by mouth daily.   1  . meloxicam (MOBIC) 7.5 MG tablet Take 1 tablet (7.5 mg total) by mouth daily. (Patient not taking: Reported on 03/29/2018) 20 tablet 0  . metFORMIN (GLUCOPHAGE-XR) 500 MG 24 hr tablet Take 500 mg by mouth daily with breakfast.   5  . metoprolol succinate (TOPROL-XL) 50 MG 24 hr tablet Take 1 & 1/2 tablets daily. (Patient taking differently: Take 75 mg by mouth daily. ) 45 tablet 135  . oxyCODONE-acetaminophen (PERCOCET/ROXICET) 5-325 MG tablet Take 1 tablet by mouth 3 (three) times daily.    . Potassium Citrate 15 MEQ (1620 MG) TBCR Take 1 tablet by mouth 3 (three) times daily.  11  . tizanidine (ZANAFLEX) 2 MG capsule Take 2 mg by mouth 3 (three) times daily as needed for muscle spasms.      No current facility-administered medications for this visit.    Facility-Administered Medications Ordered in Other Visits  Medication Dose Route Frequency Provider Last Rate Last Dose  . acetaminophen (TYLENOL) tablet 650 mg  650 mg Oral Q4H PRN Irine Seal, MD         Past Surgical History:  Procedure Laterality Date  . ABDOMINAL HYSTERECTOMY  1986  . APPENDECTOMY  1954  . BACK SURGERY  09/2016  . bladder tack  09/1984  . CHOLECYSTECTOMY  01/1999  . COLONOSCOPY  06/2006   This was her second one  . COLONOSCOPY  07/12/2011   Procedure: COLONOSCOPY;  Surgeon: Rogene Houston, MD;  [facility];   Service: Endoscopy;  Laterality: N/A;  10:30 am  . CYSTOSCOPY W/ URETERAL STENT PLACEMENT Left 10/03/2014   Procedure: CYSTOSCOPY WITH URETERAL STENT PLACEMENT;  Surgeon: Malka So, MD;  [facility];  Service: Urology;  Laterality: Left;  . CYSTOSCOPY WITH URETEROSCOPY AND STENT PLACEMENT Left 03/16/2015   Procedure: CYSTOSCOPY WITH LEFT STENT PLACEMENT;  Surgeon: Franchot Gallo, MD;  Location: [facility];  Service: Urology;  Laterality: Left;  . gout removal  2017  . HOLMIUM LASER APPLICATION Left 1/60/1093   Procedure: HOLMIUM LASER APPLICATION;  Surgeon: Franchot Gallo, MD;  [facility];  Service: Urology;  Laterality: Left;  . KNEE ARTHROSCOPY  12/2010   Left knee  . LITHOTRIPSY  06/1995  . LUMBAR FUSION  06/2016  . NEPHROLITHOTOMY Left 01/07/2015   Procedure: NEPHROLITHOTOMY PERCUTANEOUS;  Surgeon: Franchot Gallo, MD;  [facility];  Service: Urology;  Laterality: Left;  With STENT  . Rotor Cuff  2011   Right Shoulder  . STENT REMOVAL Left 03/16/2015   Procedure: LEFT URETERAL STENT REMOVAL;  Surgeon: Franchot Gallo, MD;  [facility];  Service: Urology;  Laterality: Left;  . THYROID EXPLORATION N/A 07/31/2013   Procedure: neck EXPLORATION;  Surgeon: Odis Hollingshead, MD;  [facility];  Service: General;  Laterality: N/A;  . THYROIDECTOMY N/A 07/31/2013   Procedure: PARATHYROIDECTOMY;  Surgeon: Odis Hollingshead, MD;  [facility];  Service: General;  Laterality: N/A;  . TUMOR REMOVAL  06/1997   Beign; on back x2 surgeries  . TUMOR REMOVAL  2002   "FATTY TUMORS" REMOVED FROM BACK  . URETEROSCOPY WITH HOLMIUM LASER LITHOTRIPSY Left 03/16/2015   Procedure: LEFT URETEROSCOPIC LASER LITHOTRIPSY WITH STONE EXTRACTION;  Surgeon: Franchot Gallo, MD;  [facility];  Service: Urology;  Laterality: Left;     Allergies  Allergen Reactions  . Ace Inhibitors Other (See Comments)    Patient doesn't remember this allergy.  On 12/30/2014 received LOV  visit from PCP , Dr Rory Percy in [place], [place].  - No known active drug allergies listed in his office visit note dated 05/21/2014.    Marland Kitchen Venlafaxine Anxiety      Family History  Problem Relation Age of Onset  . Hypertension Mother   . Arthritis Mother   . Hypertension Father   . Healthy Brother   . Healthy Daughter   . Healthy Daughter   . Colon cancer Neg Hx      Social History Ms. Kina reports that she has never smoked. She has never used smokeless tobacco. Ms. Igoe reports that she drinks alcohol.   Review of Systems CONSTITUTIONAL: No weight loss, fever,  chills, weakness or fatigue.  HEENT: Eyes: No visual loss, blurred vision, double vision or yellow sclerae.No hearing loss, sneezing, congestion, runny nose or sore throat.  SKIN: No rash or itching.  CARDIOVASCULAR: per hpi RESPIRATORY: No shortness of breath, cough or sputum.  GASTROINTESTINAL: No anorexia, nausea, vomiting or diarrhea. No abdominal pain or blood.  GENITOURINARY: No burning on urination, no polyuria NEUROLOGICAL: No headache, dizziness, syncope, paralysis, ataxia, numbness or tingling in the extremities. No change in bowel or bladder control.  MUSCULOSKELETAL: No muscle, back pain, joint pain or stiffness.  LYMPHATICS: No enlarged nodes. No history of splenectomy.  PSYCHIATRIC: No history of depression or anxiety.  ENDOCRINOLOGIC: No reports of sweating, cold or heat intolerance. No polyuria or polydipsia.  Marland Kitchen   Physical Examination Vitals:   04/01/18 1019  BP: 133/81  Pulse: 76  SpO2: 97%   Vitals:   04/01/18 1019  Weight: 213 lb 12.8 oz (97 kg)  Height: 5\' 3"  (1.6 m)    Gen: resting comfortably, no acute distress HEENT: no scleral icterus, pupils equal round and reactive, no palptable cervical adenopathy,  CV: RRR, 2/6 systolic murmur rusb, no jvd Resp: Clear to auscultation bilaterally GI: abdomen is soft, non-tender, non-distended, normal bowel sounds, no hepatosplenomegaly MSK:  extremities are warm, no edema.  Skin: warm, no rash Neuro:  no focal deficits Psych: appropriate affect   Diagnostic Studies Jan 2017 echo Study Conclusions  - Left ventricle: The cavity size was normal. Wall thickness was increased in a pattern of mild LVH. Systolic function was normal. The estimated ejection fraction was in the range of 60% to 65%. Wall motion was normal; there were no regional wall motion abnormalities. Doppler parameters are consistent with abnormal left ventricular relaxation (grade 1 diastolic dysfunction). - Aortic valve: Mildly calcified annulus. - Mitral valve: Calcified annulus. There was mild regurgitation. - Tricuspid valve: There was mild regurgitation.     Assessment and Plan  1. PSVT - no symptoms, continue beta blocker.   2. Chest pain -history of recurrent atypical chest pain, MSK in origin - contiue to monitor at this time    Request pcp labs F/u 1 year   Arnoldo Lenis, M.D.

## 2018-04-03 ENCOUNTER — Other Ambulatory Visit: Payer: Self-pay

## 2018-04-03 ENCOUNTER — Encounter (HOSPITAL_COMMUNITY): Payer: Self-pay | Admitting: *Deleted

## 2018-04-03 ENCOUNTER — Ambulatory Visit (HOSPITAL_COMMUNITY)
Admission: RE | Admit: 2018-04-03 | Discharge: 2018-04-03 | Disposition: A | Discharge status: Home / Self Care | Payer: Medicare Other | Source: Ambulatory Visit | Attending: Internal Medicine | Admitting: Internal Medicine

## 2018-04-03 ENCOUNTER — Encounter (HOSPITAL_COMMUNITY)
Admission: RE | Disposition: A | Discharge status: Home / Self Care | Payer: Self-pay | Source: Ambulatory Visit | Attending: Internal Medicine

## 2018-04-03 DIAGNOSIS — Z09 Encounter for follow-up examination after completed treatment for conditions other than malignant neoplasm: Secondary | ICD-10-CM | POA: Diagnosis not present

## 2018-04-03 DIAGNOSIS — K589 Irritable bowel syndrome without diarrhea: Secondary | ICD-10-CM | POA: Insufficient documentation

## 2018-04-03 DIAGNOSIS — Z888 Allergy status to other drugs, medicaments and biological substances status: Secondary | ICD-10-CM | POA: Diagnosis not present

## 2018-04-03 DIAGNOSIS — K5730 Diverticulosis of large intestine without perforation or abscess without bleeding: Secondary | ICD-10-CM | POA: Diagnosis not present

## 2018-04-03 DIAGNOSIS — E039 Hypothyroidism, unspecified: Secondary | ICD-10-CM | POA: Diagnosis not present

## 2018-04-03 DIAGNOSIS — D123 Benign neoplasm of transverse colon: Secondary | ICD-10-CM | POA: Diagnosis not present

## 2018-04-03 DIAGNOSIS — M1990 Unspecified osteoarthritis, unspecified site: Secondary | ICD-10-CM | POA: Diagnosis not present

## 2018-04-03 DIAGNOSIS — Z1211 Encounter for screening for malignant neoplasm of colon: Secondary | ICD-10-CM | POA: Insufficient documentation

## 2018-04-03 DIAGNOSIS — I471 Supraventricular tachycardia: Secondary | ICD-10-CM | POA: Diagnosis not present

## 2018-04-03 DIAGNOSIS — Z860100 Personal history of colon polyps, unspecified: Secondary | ICD-10-CM

## 2018-04-03 DIAGNOSIS — E785 Hyperlipidemia, unspecified: Secondary | ICD-10-CM | POA: Insufficient documentation

## 2018-04-03 DIAGNOSIS — K644 Residual hemorrhoidal skin tags: Secondary | ICD-10-CM | POA: Insufficient documentation

## 2018-04-03 DIAGNOSIS — E210 Primary hyperparathyroidism: Secondary | ICD-10-CM | POA: Diagnosis not present

## 2018-04-03 DIAGNOSIS — Z79899 Other long term (current) drug therapy: Secondary | ICD-10-CM | POA: Diagnosis not present

## 2018-04-03 DIAGNOSIS — E119 Type 2 diabetes mellitus without complications: Secondary | ICD-10-CM | POA: Insufficient documentation

## 2018-04-03 DIAGNOSIS — K6289 Other specified diseases of anus and rectum: Secondary | ICD-10-CM | POA: Insufficient documentation

## 2018-04-03 DIAGNOSIS — Z7984 Long term (current) use of oral hypoglycemic drugs: Secondary | ICD-10-CM | POA: Insufficient documentation

## 2018-04-03 DIAGNOSIS — D125 Benign neoplasm of sigmoid colon: Secondary | ICD-10-CM | POA: Insufficient documentation

## 2018-04-03 DIAGNOSIS — I10 Essential (primary) hypertension: Secondary | ICD-10-CM | POA: Insufficient documentation

## 2018-04-03 HISTORY — PX: COLONOSCOPY: SHX5424

## 2018-04-03 HISTORY — PX: POLYPECTOMY: SHX5525

## 2018-04-03 HISTORY — DX: Type 2 diabetes mellitus without complications: E11.9

## 2018-04-03 LAB — GLUCOSE, CAPILLARY: GLUCOSE-CAPILLARY: 109 mg/dL — AB (ref 70–99)

## 2018-04-03 SURGERY — COLONOSCOPY
Anesthesia: Moderate Sedation

## 2018-04-03 MED ORDER — MIDAZOLAM HCL 5 MG/5ML IJ SOLN
INTRAMUSCULAR | Status: DC | PRN
  Administered 2018-04-03: 2 mg via INTRAVENOUS
  Administered 2018-04-03: 1 mg via INTRAVENOUS
  Administered 2018-04-03 (×2): 2 mg via INTRAVENOUS

## 2018-04-03 MED ORDER — SODIUM CHLORIDE 0.9 % IV SOLN
INTRAVENOUS | Status: DC
  Administered 2018-04-03: 1000 mL via INTRAVENOUS

## 2018-04-03 MED ORDER — MEPERIDINE HCL 50 MG/ML IJ SOLN
INTRAMUSCULAR | Status: DC | PRN
  Administered 2018-04-03 (×2): 25 mg via INTRAVENOUS

## 2018-04-03 MED ORDER — MIDAZOLAM HCL 5 MG/5ML IJ SOLN
INTRAMUSCULAR | Status: AC
  Filled 2018-04-03: qty 10

## 2018-04-03 MED ORDER — MEPERIDINE HCL 50 MG/ML IJ SOLN
INTRAMUSCULAR | Status: AC
  Filled 2018-04-03: qty 1

## 2018-04-03 NOTE — Discharge Instructions (Signed)
No aspirin or NSAIDs for 24 hours. Resume usual medications as before. Modified carb high-fiber diet. No driving for 24 hours. Physician will call with biopsy results.       Colonoscopy, Adult, Care After This sheet gives you information about how to care for yourself after your procedure. Your doctor may also give you more specific instructions. If you have problems or questions, call your doctor. Follow these instructions at home: General instructions   For the first 24 hours after the procedure: ? Do not drive or use machinery. ? Do not sign important documents. ? Do not drink alcohol. ? Do your daily activities more slowly than normal. ? Eat foods that are soft and easy to digest. ? Rest often.  Take over-the-counter or prescription medicines only as told by your doctor.  It is up to you to get the results of your procedure. Ask your doctor, or the department performing the procedure, when your results will be ready. To help cramping and bloating:  Try walking around.  Put heat on your belly (abdomen) as told by your doctor. Use a heat source that your doctor recommends, such as a moist heat pack or a heating pad. ? Put a towel between your skin and the heat source. ? Leave the heat on for 20-30 minutes. ? Remove the heat if your skin turns bright red. This is especially important if you cannot feel pain, heat, or cold. You can get burned. Eating and drinking  Drink enough fluid to keep your pee (urine) clear or pale yellow.  Return to your normal diet as told by your doctor. Avoid heavy or fried foods that are hard to digest.  Avoid drinking alcohol for as long as told by your doctor. Contact a doctor if:  You have blood in your poop (stool) 2-3 days after the procedure. Get help right away if:  You have more than a small amount of blood in your poop.  You see large clumps of tissue (blood clots) in your poop.  Your belly is swollen.  You feel sick to your  stomach (nauseous).  You throw up (vomit).  You have a fever.  You have belly pain that gets worse, and medicine does not help your pain. This information is not intended to replace advice given to you by your health care provider. Make sure you discuss any questions you have with your health care provider. Document Released: 10/07/2010 Document Revised: 05/29/2016 Document Reviewed: 05/29/2016 Elsevier Interactive Patient Education  2017 [employer].     Colon Polyps Polyps are tissue growths inside the body. Polyps can grow in many places, including the large intestine (colon). A polyp may be a round bump or a mushroom-shaped growth. You could have one polyp or several. Most colon polyps are noncancerous (benign). However, some colon polyps can become cancerous over time. What are the causes? The exact cause of colon polyps is not known. What increases the risk? This condition is more likely to develop in people who:  Have a family history of colon cancer or colon polyps.  Are older than 48 or older than 45 if they are African American.  Have inflammatory bowel disease, such as ulcerative colitis or Crohn disease.  Are overweight.  Smoke cigarettes.  Do not get enough exercise.  Drink too much alcohol.  Eat a diet that is: ? High in fat and red meat. ? Low in fiber.  Had childhood cancer that was treated with abdominal radiation.  What are the  signs or symptoms? Most polyps do not cause symptoms. If you have symptoms, they may include:  Blood coming from your rectum when having a bowel movement.  Blood in your stool.The stool may look dark red or black.  A change in bowel habits, such as constipation or diarrhea.  How is this diagnosed? This condition is diagnosed with a colonoscopy. This is a procedure that uses a lighted, flexible scope to look at the inside of your colon. How is this treated? Treatment for this condition involves removing any polyps that  are found. Those polyps will then be tested for cancer. If cancer is found, your health care provider will talk to you about options for colon cancer treatment. Follow these instructions at home: Diet  Eat plenty of fiber, such as fruits, vegetables, and whole grains.  Eat foods that are high in calcium and vitamin D, such as milk, cheese, yogurt, eggs, liver, fish, and broccoli.  Limit foods high in fat, red meats, and processed meats, such as hot dogs, sausage, bacon, and lunch meats.  Maintain a healthy weight, or lose weight if recommended by your health care provider. General instructions  Do not smoke cigarettes.  Do not drink alcohol excessively.  Keep all follow-up visits as told by your health care provider. This is important. This includes keeping regularly scheduled colonoscopies. Talk to your health care provider about when you need a colonoscopy.  Exercise every day or as told by your health care provider. Contact a health care provider if:  You have new or worsening bleeding during a bowel movement.  You have new or increased blood in your stool.  You have a change in bowel habits.  You unexpectedly lose weight. This information is not intended to replace advice given to you by your health care provider. Make sure you discuss any questions you have with your health care provider. Document Released: 05/31/2004 Document Revised: 02/10/2016 Document Reviewed: 07/26/2015 Elsevier Interactive Patient Education  2018 [employer].      High-Fiber Diet Fiber, also called dietary fiber, is a type of carbohydrate found in fruits, vegetables, whole grains, and beans. A high-fiber diet can have many health benefits. Your health care provider may recommend a high-fiber diet to help:  Prevent constipation. Fiber can make your bowel movements more regular.  Lower your cholesterol.  Relieve hemorrhoids, uncomplicated diverticulosis, or irritable bowel syndrome.  Prevent  overeating as part of a weight-loss plan.  Prevent heart disease, type 2 diabetes, and certain cancers.  What is my plan? The recommended daily intake of fiber includes:  38 grams for men under age 56.  41 grams for men over age 29.  15 grams for women under age 56.  45 grams for women over age 69.  You can get the recommended daily intake of dietary fiber by eating a variety of fruits, vegetables, grains, and beans. Your health care provider may also recommend a fiber supplement if it is not possible to get enough fiber through your diet. What do I need to know about a high-fiber diet?  Fiber supplements have not been widely studied for their effectiveness, so it is better to get fiber through food sources.  Always check the fiber content on thenutrition facts label of any prepackaged food. Look for foods that contain at least 5 grams of fiber per serving.  Ask your dietitian if you have questions about specific foods that are related to your condition, especially if those foods are not listed in the following  section.  Increase your daily fiber consumption gradually. Increasing your intake of dietary fiber too quickly may cause bloating, cramping, or gas.  Drink plenty of water. Water helps you to digest fiber. What foods can I eat? Grains Whole-grain breads. Multigrain cereal. Oats and oatmeal. Brown rice. Barley. Bulgur wheat. [place]. Bran muffins. Popcorn. Rye wafer crackers. Vegetables Sweet potatoes. Spinach. Kale. Artichokes. Cabbage. Broccoli. Green peas. Carrots. Squash. Fruits Berries. Pears. Apples. Oranges. Avocados. Prunes and raisins. Dried figs. Meats and Other Protein Sources [place], kidney, pinto, and soy beans. Split peas. Lentils. Nuts and seeds. Dairy Fiber-fortified yogurt. Beverages Fiber-fortified soy milk. Fiber-fortified orange juice. Other Fiber bars. The items listed above may not be a complete list of recommended foods or beverages. Contact your  dietitian for more options. What foods are not recommended? Grains White bread. Pasta made with refined flour. White rice. Vegetables Fried potatoes. Canned vegetables. Well-cooked vegetables. Fruits Fruit juice. Cooked, strained fruit. Meats and Other Protein Sources Fatty cuts of meat. Fried Sales executive or fried fish. Dairy Milk. Yogurt. Cream cheese. Sour cream. Beverages Soft drinks. Other Cakes and pastries. Butter and oils. The items listed above may not be a complete list of foods and beverages to avoid. Contact your dietitian for more information. What are some tips for including high-fiber foods in my diet?  Eat a wide variety of high-fiber foods.  Make sure that half of all grains consumed each day are whole grains.  Replace breads and cereals made from refined flour or white flour with whole-grain breads and cereals.  Replace white rice with brown rice, bulgur wheat, or millet.  Start the day with a breakfast that is high in fiber, such as a cereal that contains at least 5 grams of fiber per serving.  Use beans in place of meat in soups, salads, or pasta.  Eat high-fiber snacks, such as berries, raw vegetables, nuts, or popcorn. This information is not intended to replace advice given to you by your health care provider. Make sure you discuss any questions you have with your health care provider. Document Released: 09/04/2005 Document Revised: 02/10/2016 Document Reviewed: 02/17/2014 Elsevier Interactive Patient Education  2018 [employer].     Diverticulosis Diverticulosis is a condition that develops when small pouches (diverticula) form in the wall of the large intestine (colon). The colon is where water is absorbed and stool is formed. The pouches form when the inside layer of the colon pushes through weak spots in the outer layers of the colon. You may have a few pouches or many of them. What are the causes? The cause of this condition is not known. What  increases the risk? The following factors may make you more likely to develop this condition:  Being older than age 73. Your risk for this condition increases with age. Diverticulosis is rare among people younger than age 63. By age 25, many people have it.  Eating a low-fiber diet.  Having frequent constipation.  Being overweight.  Not getting enough exercise.  Smoking.  Taking over-the-counter pain medicines, like aspirin and ibuprofen.  Having a family history of diverticulosis.  What are the signs or symptoms? In most people, there are no symptoms of this condition. If you do have symptoms, they may include:  Bloating.  Cramps in the abdomen.  Constipation or diarrhea.  Pain in the lower left side of the abdomen.  How is this diagnosed? This condition is most often diagnosed during an exam for other colon problems. Because diverticulosis usually has no  symptoms, it often cannot be diagnosed independently. This condition may be diagnosed by:  Using a flexible scope to examine the colon (colonoscopy).  Taking an X-ray of the colon after dye has been put into the colon (barium enema).  Doing a CT scan.  How is this treated? You may not need treatment for this condition if you have never developed an infection related to diverticulosis. If you have had an infection before, treatment may include:  Eating a high-fiber diet. This may include eating more fruits, vegetables, and grains.  Taking a fiber supplement.  Taking a live bacteria supplement (probiotic).  Taking medicine to relax your colon.  Taking antibiotic medicines.  Follow these instructions at home:  Drink 6-8 glasses of water or more each day to prevent constipation.  Try not to strain when you have a bowel movement.  If you have had an infection before: ? Eat more fiber as directed by your health care provider or your diet and nutrition specialist (dietitian). ? Take a fiber supplement or  probiotic, if your health care provider approves.  Take over-the-counter and prescription medicines only as told by your health care provider.  If you were prescribed an antibiotic, take it as told by your health care provider. Do not stop taking the antibiotic even if you start to feel better.  Keep all follow-up visits as told by your health care provider. This is important. Contact a health care provider if:  You have pain in your abdomen.  You have bloating.  You have cramps.  You have not had a bowel movement in 3 days. Get help right away if:  Your pain gets worse.  Your bloating becomes very bad.  You have a fever or chills, and your symptoms suddenly get worse.  You vomit.  You have bowel movements that are bloody or black.  You have bleeding from your rectum. Summary  Diverticulosis is a condition that develops when small pouches (diverticula) form in the wall of the large intestine (colon).  You may have a few pouches or many of them.  This condition is most often diagnosed during an exam for other colon problems.  If you have had an infection related to diverticulosis, treatment may include increasing the fiber in your diet, taking supplements, or taking medicines. This information is not intended to replace advice given to you by your health care provider. Make sure you discuss any questions you have with your health care provider. Document Released: 06/01/2004 Document Revised: 07/24/2016 Document Reviewed: 07/24/2016 Elsevier Interactive Patient Education  2017 [employer].

## 2018-04-03 NOTE — H&P (Signed)
Natasha Warren is an 72 y.o. female.   Chief Complaint: Patient is here for colonoscopy. HPI: Patient is 72 year old Caucasian female who has a history of colonic polyp last exam was in October 2012 with removal of 12 mm sessile polyp from cecum and was sessile serrated polyp.  And is here for surveillance colonoscopy.  She denies abdominal pain change in bowel habits or rectal bleeding.  She has history of back pain.  She developed constipation with pain medication.  As she is decreased the dose constipation has improved.  She is on a stool softener. Family history is negative for CRC.  Past Medical History:  Diagnosis Date  . Arthritis   . Bulging lumbar disc   . Colon polyps   . Diabetes mellitus without complication ([place])   . History of gout   . History of gout   . Hyperlipidemia   . Hyperparathyroidism, primary ([place])   . Hypertension   . Hypothyroidism   . Irritable bowel syndrome   . Nocturia   . Renal disorder    kidney stones  . Seasonal allergies   . SVT (supraventricular tachycardia) ([place])   . Thyroid disorder   . Urolithiasis     Past Surgical History:  Procedure Laterality Date  . ABDOMINAL HYSTERECTOMY  1986  . APPENDECTOMY  1954  . BACK SURGERY  09/2016  . bladder tack  09/1984  . CHOLECYSTECTOMY  01/1999  . COLONOSCOPY  06/2006   This was her second one  . COLONOSCOPY  07/12/2011   Procedure: COLONOSCOPY;  Surgeon: Rogene Houston, MD;  Location: [facility];  Service: Endoscopy;  Laterality: N/A;  10:30 am  . CYSTOSCOPY W/ URETERAL STENT PLACEMENT Left 10/03/2014   Procedure: CYSTOSCOPY WITH URETERAL STENT PLACEMENT;  Surgeon: Malka So, MD;  Location: [facility];  Service: Urology;  Laterality: Left;  . CYSTOSCOPY WITH URETEROSCOPY AND STENT PLACEMENT Left 03/16/2015   Procedure: CYSTOSCOPY WITH LEFT STENT PLACEMENT;  Surgeon: Franchot Gallo, MD;  Location: [facility];  Service: Urology;  Laterality: Left;  . gout removal  2017  . HOLMIUM LASER APPLICATION  Left 1/95/0932   Procedure: HOLMIUM LASER APPLICATION;  Surgeon: Franchot Gallo, MD;  Location: [facility];  Service: Urology;  Laterality: Left;  . KNEE ARTHROSCOPY  12/2010   Left knee  . LITHOTRIPSY  06/1995  . LUMBAR FUSION  06/2016  . NEPHROLITHOTOMY Left 01/07/2015   Procedure: NEPHROLITHOTOMY PERCUTANEOUS;  Surgeon: Franchot Gallo, MD;  Location: [facility];  Service: Urology;  Laterality: Left;  With STENT  . Rotor Cuff  2011   Right Shoulder  . STENT REMOVAL Left 03/16/2015   Procedure: LEFT URETERAL STENT REMOVAL;  Surgeon: Franchot Gallo, MD;  Location: [facility];  Service: Urology;  Laterality: Left;  . THYROID EXPLORATION N/A 07/31/2013   Procedure: neck EXPLORATION;  Surgeon: Odis Hollingshead, MD;  Location: [facility];  Service: General;  Laterality: N/A;  . THYROIDECTOMY N/A 07/31/2013   Procedure: PARATHYROIDECTOMY;  Surgeon: Odis Hollingshead, MD;  Location: [facility];  Service: General;  Laterality: N/A;  . TUMOR REMOVAL  06/1997   Beign; on back x2 surgeries  . TUMOR REMOVAL  2002   "FATTY TUMORS" REMOVED FROM BACK  . URETEROSCOPY WITH HOLMIUM LASER LITHOTRIPSY Left 03/16/2015   Procedure: LEFT URETEROSCOPIC LASER LITHOTRIPSY WITH STONE EXTRACTION;  Surgeon: Franchot Gallo, MD;  Location: [facility];  Service: Urology;  Laterality: Left;    Family History  Problem Relation Age of Onset  . Hypertension Mother   .  Arthritis Mother   . Hypertension Father   . Healthy Brother   . Healthy Daughter   . Healthy Daughter   . Colon cancer Neg Hx    Social History:  reports that she has never smoked. She has never used smokeless tobacco. She reports that she drinks alcohol. She reports that she does not use drugs.  Allergies:  Allergies  Allergen Reactions  . Ace Inhibitors Other (See Comments)    Patient doesn't remember this allergy.  On 12/30/2014 received LOV visit from PCP , Dr Rory Percy in [place], [place].  - No known active drug allergies listed in his office visit note dated  05/21/2014.    Marland Kitchen Venlafaxine Anxiety    Medications Prior to Admission  Medication Sig Dispense Refill  . acetaminophen (TYLENOL) 500 MG tablet Take 1,000 mg by mouth every 6 (six) hours as needed for moderate pain or headache.     . allopurinol (ZYLOPRIM) 300 MG tablet Take 300 mg by mouth daily.   5  . Cholecalciferol (VITAMIN D3) 1000 units CAPS Take 1,000 Units by mouth daily.    Marland Kitchen docusate sodium (COLACE) 50 MG capsule Take 250 mg by mouth daily.     Marland Kitchen gabapentin (NEURONTIN) 300 MG capsule Take 300 mg by mouth at bedtime.    Marland Kitchen levothyroxine (SYNTHROID, LEVOTHROID) 50 MCG tablet Take 50 mcg by mouth daily before breakfast.     . losartan (COZAAR) 100 MG tablet Take 100 mg by mouth daily.   1  . metFORMIN (GLUCOPHAGE-XR) 500 MG 24 hr tablet Take 500 mg by mouth daily with breakfast.   5  . metoprolol succinate (TOPROL-XL) 50 MG 24 hr tablet Take 1 & 1/2 tablets daily. (Patient taking differently: Take 75 mg by mouth daily. ) 45 tablet 135  . oxyCODONE-acetaminophen (PERCOCET/ROXICET) 5-325 MG tablet Take 1 tablet by mouth 3 (three) times daily.    . Potassium Citrate 15 MEQ (1620 MG) TBCR Take 1 tablet by mouth 3 (three) times daily.  11  . tizanidine (ZANAFLEX) 2 MG capsule Take 2 mg by mouth 3 (three) times daily as needed for muscle spasms.       Results for orders placed or performed during the hospital encounter of 04/03/18 (from the past 48 hour(s))  Glucose, capillary     Status: Abnormal   Collection Time: 04/03/18  7:48 AM  Result Value Ref Range   Glucose-Capillary 109 (H) 70 - 99 mg/dL   No results found.  ROS  Blood pressure 111/73, pulse 75, temperature 98.3 F (36.8 C), temperature source Oral, resp. rate 14, height 5\' 3"  (1.6 m), weight 200 lb (90.7 kg), SpO2 98 %. Physical Exam  Constitutional: She appears well-developed and well-nourished.  HENT:  Mouth/Throat: Oropharynx is clear and moist.  Eyes: Conjunctivae are normal. No scleral icterus.  Neck: No  thyromegaly present.  Cardiovascular: Normal rate, regular rhythm and intact distal pulses.  Murmur heard. Grade 2/6 systolic ejection murmur best heard at aortic area.  Respiratory: Effort normal and breath sounds normal.  GI:  Abdomen is obese but soft and nontender with organomegaly or masses.  Musculoskeletal: She exhibits no edema.  Lymphadenopathy:    She has no cervical adenopathy.  Neurological: She is alert.  Skin: Skin is warm and dry.     Assessment/Plan History of cecal sessile serrated polyp. Surveillance colonoscopy.  Hildred Laser, MD 04/03/2018, 8:28 AM

## 2018-04-03 NOTE — Op Note (Signed)
[facility] Patient Name: Natasha Warren Procedure Date: 04/03/2018 8:23 AM MRN: [id number] Date of Birth: Sep 08, 1946 Attending MD: Hildred Laser , MD CSN: [id number] Age: 72 Admit Type: Outpatient Procedure:                Colonoscopy Indications:              High risk colon cancer surveillance: Personal                            history of colonic polyps Providers:                Hildred Laser, MD, Lurline Del, RN, Nelma Rothman,                            Technician Referring MD:             Rory Percy, MD Medicines:                Meperidine 50 mg IV, Midazolam 7 mg IV Complications:            No immediate complications. Estimated Blood Loss:     Estimated blood loss was minimal. Procedure:                Pre-Anesthesia Assessment:                           - Prior to the procedure, a History and Physical                            was performed, and patient medications and                            allergies were reviewed. The patient's tolerance of                            previous anesthesia was also reviewed. The risks                            and benefits of the procedure and the sedation                            options and risks were discussed with the patient.                            All questions were answered, and informed consent                            was obtained. Prior Anticoagulants: The patient has                            taken no previous anticoagulant or antiplatelet                            agents. ASA Grade Assessment: III - A patient with  severe systemic disease. After reviewing the risks                            and benefits, the patient was deemed in                            satisfactory condition to undergo the procedure.                           After obtaining informed consent, the colonoscope                            was passed under direct vision. Throughout the   procedure, the patient's blood pressure, pulse, and                            oxygen saturations were monitored continuously. The                            [id number]-[id number] ([id number]) was introduced through the                            anus and advanced to the the cecum, identified by                            appendiceal orifice and ileocecal valve. The                            ileocecal valve, appendiceal orifice, and rectum                            were photographed. The colonoscopy was somewhat                            difficult. Successful completion of the procedure                            was aided by changing the patient to a supine                            position and applying abdominal pressure. The                            patient tolerated the procedure well. The quality                            of the bowel preparation was adequate. Scope In: 8:40:15 AM Scope Out: 9:17:23 AM Scope Withdrawal Time: 0 hours 26 minutes 42 seconds  Total Procedure Duration: 0 hours 37 minutes 8 seconds  Findings:      Skin tags were found on perianal exam.      A small polyp was found in the proximal transverse colon. The polyp was       sessile. The polyp was removed with a cold snare. Resection was  complete, but the polyp tissue was not retrieved.      Three sessile polyps were found in the sigmoid colon, splenic flexure       and transverse colon. The polyps were small in size. These were biopsied       with a cold forceps for histology. The pathology specimen was placed       into Bottle Number 1.      Scattered small-mouthed diverticula were found in the sigmoid colon.      External hemorrhoids were found during retroflexion. The hemorrhoids       were small.      Anal papilla(e) were hypertrophied. Impression:               - Perianal skin tags found on perianal exam.                           - One small polyp in the proximal transverse colon,                             removed with a cold snare. Complete resection.                            Polyp tissue not retrieved.                           - Three small polyps in the sigmoid colon, at the                            splenic flexure and in the transverse colon.                            Biopsied.                           - Diverticulosis in the sigmoid colon.                           - External hemorrhoids.                           - Small anal papilla. Moderate Sedation:      Moderate (conscious) sedation was administered by the endoscopy nurse       and supervised by the endoscopist. The following parameters were       monitored: oxygen saturation, heart rate, blood pressure, CO2       capnography and response to care. Total physician intraservice time was       44 minutes. Recommendation:           - Patient has a contact number available for                            emergencies. The signs and symptoms of potential                            delayed complications were discussed with the  patient. Return to normal activities tomorrow.                            Written discharge instructions were provided to the                            patient.                           - High fiber diet and diabetic (ADA) diet today.                           - Continue present medications.                           - No aspirin, ibuprofen, naproxen, or other                            non-steroidal anti-inflammatory drugs for 1 day.                           - Await pathology results.                           - Repeat colonoscopy in 5 years for surveillance. Procedure Code(s):        --- Professional ---                           [phone], Colonoscopy, flexible; with removal of                            tumor(s), polyp(s), or other lesion(s) by snare                            technique                           45380, 59, Colonoscopy, flexible; with biopsy,                             single or multiple                           G0500, Moderate sedation services provided by the                            same physician or other qualified health care                            professional performing a gastrointestinal                            endoscopic service that sedation supports,                            requiring the presence of an independent trained  observer to assist in the monitoring of the                            patient's level of consciousness and physiological                            status; initial 15 minutes of intra-service time;                            patient age 59 years or older (additional time may                            be reported with [phone], as appropriate)                           [id number], Moderate sedation services provided by the                            same physician or other qualified health care                            professional performing the diagnostic or                            therapeutic service that the sedation supports,                            requiring the presence of an independent trained                            observer to assist in the monitoring of the                            patient's level of consciousness and physiological                            status; each additional 15 minutes intraservice                            time (List separately in addition to code for                            primary service)                           [phone], Moderate sedation services provided by the                            same physician or other qualified health care                            professional performing the diagnostic or  therapeutic service that the sedation supports,                            requiring the presence of an independent trained                            observer to assist in the monitoring of the                             patient's level of consciousness and physiological                            status; each additional 15 minutes intraservice                            time (List separately in addition to code for                            primary service) Diagnosis Code(s):        --- Professional ---                           K64.4, Residual hemorrhoidal skin tags                           Z86.010, Personal history of colonic polyps                           D12.3, Benign neoplasm of transverse colon (hepatic                            flexure or splenic flexure)                           D12.5, Benign neoplasm of sigmoid colon                           K62.89, Other specified diseases of anus and rectum                           K57.30, Diverticulosis of large intestine without                            perforation or abscess without bleeding CPT copyright 2017 [employer]. All rights reserved. The codes documented in this report are preliminary and upon coder review may  be revised to meet current compliance requirements. Hildred Laser, MD Hildred Laser, MD 04/03/2018 9:30:05 AM This report has been signed electronically. Number of Addenda: 0

## 2018-04-08 ENCOUNTER — Encounter (HOSPITAL_COMMUNITY): Payer: Self-pay | Admitting: Internal Medicine

## 2018-06-12 DIAGNOSIS — M5106 Intervertebral disc disorders with myelopathy, lumbar region: Secondary | ICD-10-CM | POA: Diagnosis not present

## 2018-06-12 DIAGNOSIS — M4150 Other secondary scoliosis, site unspecified: Secondary | ICD-10-CM | POA: Diagnosis not present

## 2018-06-12 DIAGNOSIS — Z6838 Body mass index (BMI) 38.0-38.9, adult: Secondary | ICD-10-CM | POA: Diagnosis not present

## 2018-07-12 DIAGNOSIS — M170 Bilateral primary osteoarthritis of knee: Secondary | ICD-10-CM | POA: Diagnosis not present

## 2018-07-26 DIAGNOSIS — Z23 Encounter for immunization: Secondary | ICD-10-CM | POA: Diagnosis not present

## 2018-07-26 DIAGNOSIS — E039 Hypothyroidism, unspecified: Secondary | ICD-10-CM | POA: Diagnosis not present

## 2018-07-26 DIAGNOSIS — Z6839 Body mass index (BMI) 39.0-39.9, adult: Secondary | ICD-10-CM | POA: Diagnosis not present

## 2018-07-26 DIAGNOSIS — M129 Arthropathy, unspecified: Secondary | ICD-10-CM | POA: Diagnosis not present

## 2018-09-09 DIAGNOSIS — M85851 Other specified disorders of bone density and structure, right thigh: Secondary | ICD-10-CM | POA: Diagnosis not present

## 2018-09-09 DIAGNOSIS — M810 Age-related osteoporosis without current pathological fracture: Secondary | ICD-10-CM | POA: Diagnosis not present

## 2018-09-16 DIAGNOSIS — Z6839 Body mass index (BMI) 39.0-39.9, adult: Secondary | ICD-10-CM | POA: Diagnosis not present

## 2018-09-16 DIAGNOSIS — R262 Difficulty in walking, not elsewhere classified: Secondary | ICD-10-CM | POA: Diagnosis not present

## 2018-09-16 DIAGNOSIS — M5106 Intervertebral disc disorders with myelopathy, lumbar region: Secondary | ICD-10-CM | POA: Diagnosis not present

## 2018-09-20 DIAGNOSIS — Z981 Arthrodesis status: Secondary | ICD-10-CM | POA: Diagnosis not present

## 2018-09-20 DIAGNOSIS — M5410 Radiculopathy, site unspecified: Secondary | ICD-10-CM | POA: Diagnosis not present

## 2018-09-20 DIAGNOSIS — M5106 Intervertebral disc disorders with myelopathy, lumbar region: Secondary | ICD-10-CM | POA: Diagnosis not present

## 2018-09-30 DIAGNOSIS — M1611 Unilateral primary osteoarthritis, right hip: Secondary | ICD-10-CM | POA: Diagnosis not present

## 2018-09-30 DIAGNOSIS — K5730 Diverticulosis of large intestine without perforation or abscess without bleeding: Secondary | ICD-10-CM | POA: Diagnosis not present

## 2018-09-30 DIAGNOSIS — M769 Unspecified enthesopathy, lower limb, excluding foot: Secondary | ICD-10-CM | POA: Diagnosis not present

## 2018-10-02 ENCOUNTER — Ambulatory Visit (HOSPITAL_COMMUNITY)
Admission: RE | Admit: 2018-10-02 | Discharge: 2018-10-02 | Disposition: A | Discharge status: Home / Self Care | Payer: Medicare Other | Source: Ambulatory Visit | Attending: Urology | Admitting: Urology

## 2018-10-02 ENCOUNTER — Other Ambulatory Visit (HOSPITAL_COMMUNITY): Payer: Self-pay | Admitting: Urology

## 2018-10-02 DIAGNOSIS — N200 Calculus of kidney: Secondary | ICD-10-CM | POA: Insufficient documentation

## 2018-10-02 IMAGING — DX DG ABDOMEN 1V
2 series · 2 of 2 positions shown · non-contrast
Comparison: [DATE] and earlier, including CT abdomen and pelvis
[DATE].

CLINICAL DATA: Follow-up nephrolithiasis.

EXAM:
ABDOMEN - 1 VIEW

[abdomen kub (1 of 2)]
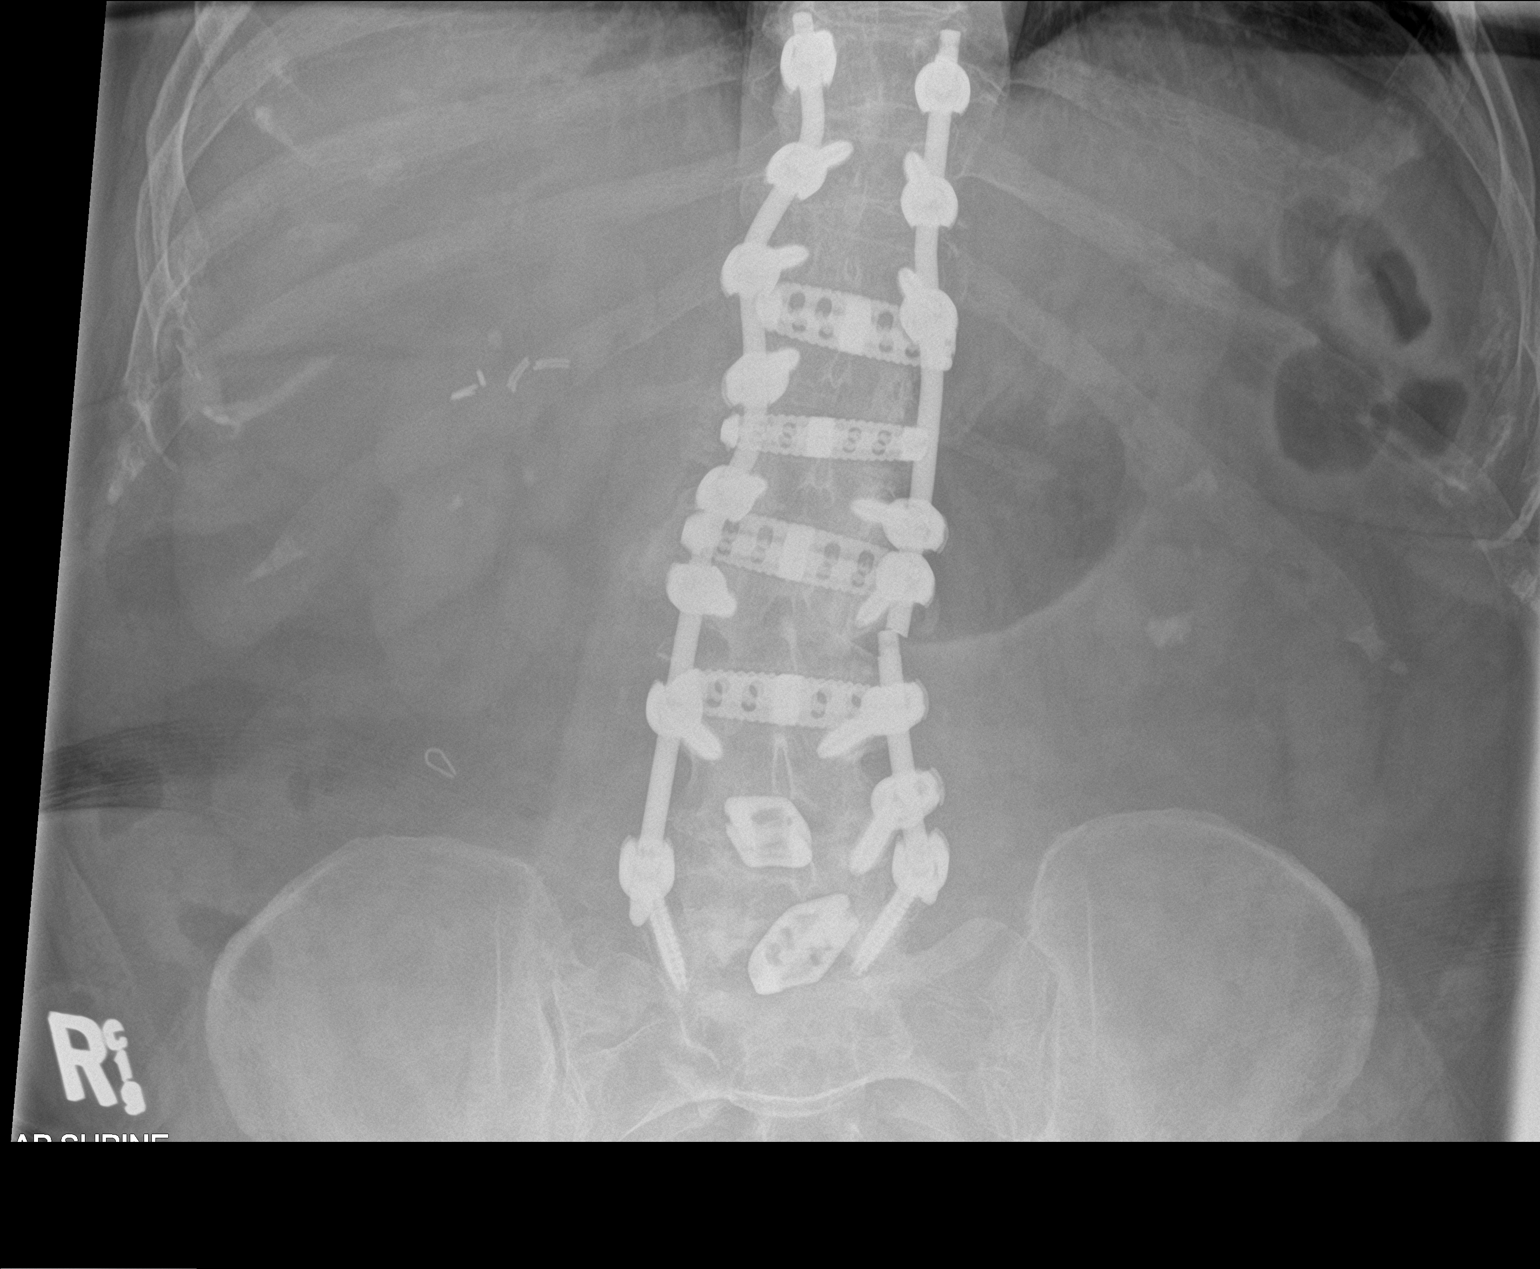

[abdomen kub (2 of 2)]
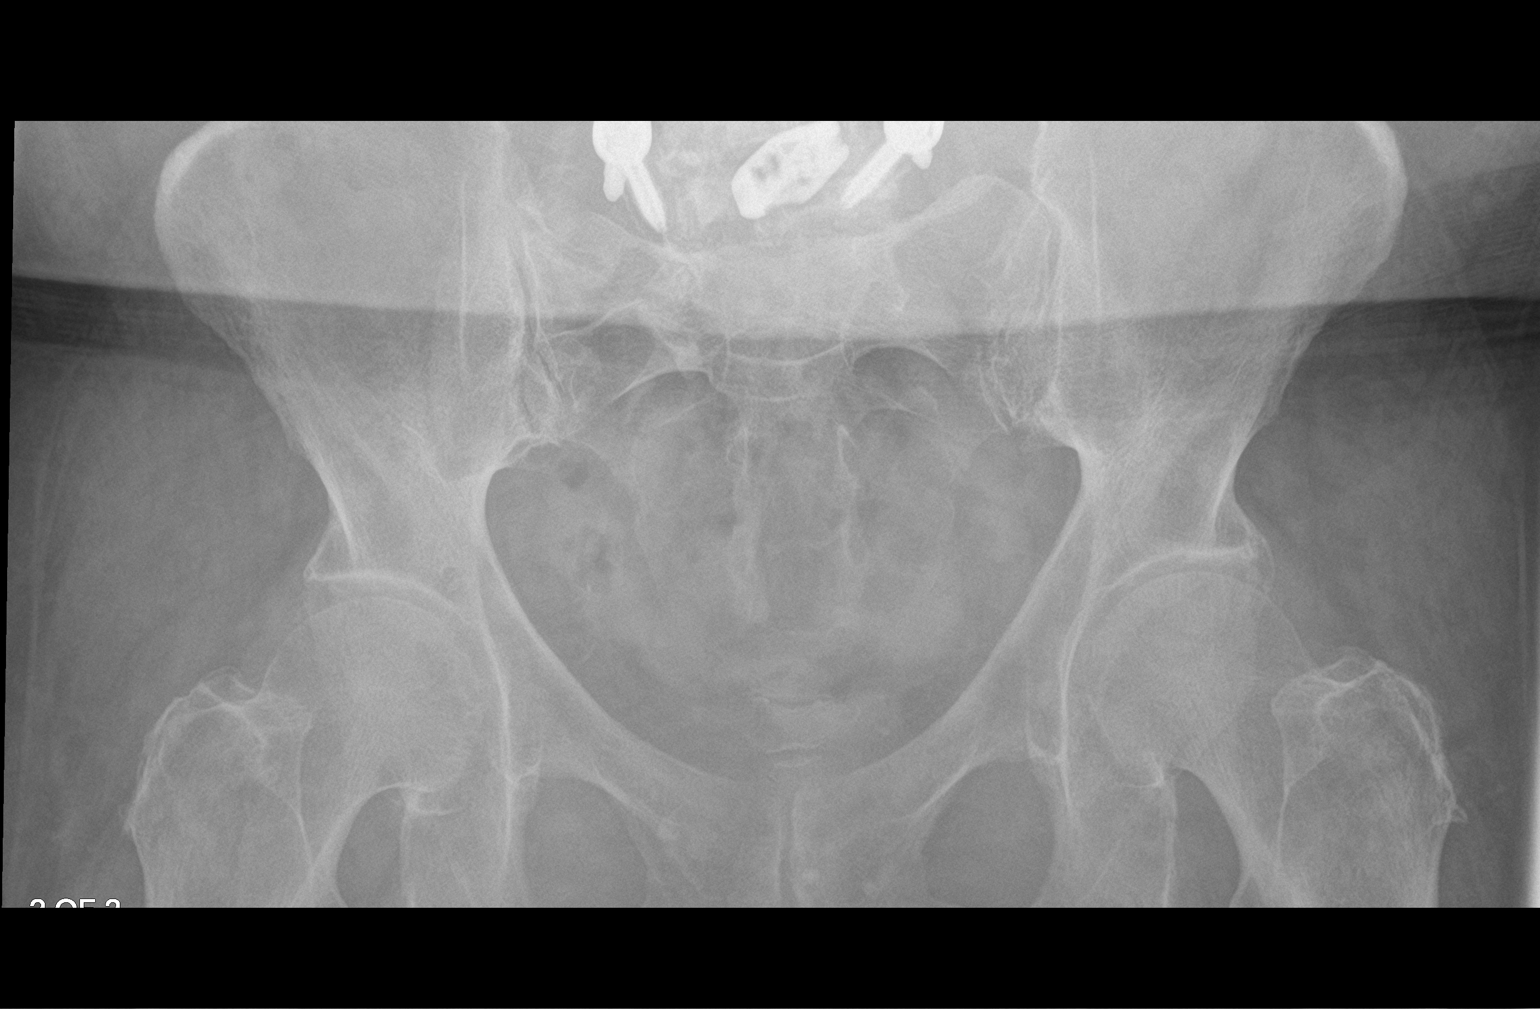

[2 of 2 positions shown; findings below may reference images not displayed]

FINDINGS: BILATERAL renal calculi, unchanged in appearance and position since
the examination 6 months ago. No visible ureteral calculi on either
side. Phleboliths low in the pelvis. Normal bowel gas pattern. Prior
thoracolumbar fusion.
IMPRESSION: Stable BILATERAL nephrolithiasis.  No acute abdominal abnormality.

## 2018-10-08 ENCOUNTER — Ambulatory Visit (INDEPENDENT_AMBULATORY_CARE_PROVIDER_SITE_OTHER): Payer: Medicare Other | Admitting: Urology

## 2018-10-08 DIAGNOSIS — N3946 Mixed incontinence: Secondary | ICD-10-CM

## 2018-10-08 DIAGNOSIS — N200 Calculus of kidney: Secondary | ICD-10-CM

## 2018-10-09 DIAGNOSIS — Z6839 Body mass index (BMI) 39.0-39.9, adult: Secondary | ICD-10-CM | POA: Diagnosis not present

## 2018-10-09 DIAGNOSIS — M25551 Pain in right hip: Secondary | ICD-10-CM | POA: Diagnosis not present

## 2018-10-09 DIAGNOSIS — S76301D Unspecified injury of muscle, fascia and tendon of the posterior muscle group at thigh level, right thigh, subsequent encounter: Secondary | ICD-10-CM | POA: Diagnosis not present

## 2018-11-06 DIAGNOSIS — M25551 Pain in right hip: Secondary | ICD-10-CM | POA: Diagnosis not present

## 2018-11-06 DIAGNOSIS — S76301D Unspecified injury of muscle, fascia and tendon of the posterior muscle group at thigh level, right thigh, subsequent encounter: Secondary | ICD-10-CM | POA: Diagnosis not present

## 2018-11-06 DIAGNOSIS — Z6841 Body Mass Index (BMI) 40.0 and over, adult: Secondary | ICD-10-CM | POA: Diagnosis not present

## 2018-12-05 DIAGNOSIS — M4150 Other secondary scoliosis, site unspecified: Secondary | ICD-10-CM | POA: Diagnosis not present

## 2018-12-05 DIAGNOSIS — S76301D Unspecified injury of muscle, fascia and tendon of the posterior muscle group at thigh level, right thigh, subsequent encounter: Secondary | ICD-10-CM | POA: Diagnosis not present

## 2018-12-05 DIAGNOSIS — M5106 Intervertebral disc disorders with myelopathy, lumbar region: Secondary | ICD-10-CM | POA: Diagnosis not present

## 2018-12-05 DIAGNOSIS — Z6841 Body Mass Index (BMI) 40.0 and over, adult: Secondary | ICD-10-CM | POA: Diagnosis not present

## 2019-01-20 DIAGNOSIS — M5416 Radiculopathy, lumbar region: Secondary | ICD-10-CM | POA: Diagnosis not present

## 2019-01-20 DIAGNOSIS — M545 Low back pain: Secondary | ICD-10-CM | POA: Diagnosis not present

## 2019-01-20 DIAGNOSIS — M47816 Spondylosis without myelopathy or radiculopathy, lumbar region: Secondary | ICD-10-CM | POA: Diagnosis not present

## 2019-01-20 DIAGNOSIS — M4126 Other idiopathic scoliosis, lumbar region: Secondary | ICD-10-CM | POA: Diagnosis not present

## 2019-02-06 DIAGNOSIS — I10 Essential (primary) hypertension: Secondary | ICD-10-CM | POA: Diagnosis not present

## 2019-02-06 DIAGNOSIS — N184 Chronic kidney disease, stage 4 (severe): Secondary | ICD-10-CM | POA: Diagnosis not present

## 2019-02-06 DIAGNOSIS — E039 Hypothyroidism, unspecified: Secondary | ICD-10-CM | POA: Diagnosis not present

## 2019-02-06 DIAGNOSIS — D519 Vitamin B12 deficiency anemia, unspecified: Secondary | ICD-10-CM | POA: Diagnosis not present

## 2019-02-12 DIAGNOSIS — Z6841 Body Mass Index (BMI) 40.0 and over, adult: Secondary | ICD-10-CM | POA: Diagnosis not present

## 2019-02-12 DIAGNOSIS — Z23 Encounter for immunization: Secondary | ICD-10-CM | POA: Diagnosis not present

## 2019-02-12 DIAGNOSIS — M129 Arthropathy, unspecified: Secondary | ICD-10-CM | POA: Diagnosis not present

## 2019-02-12 DIAGNOSIS — M1000 Idiopathic gout, unspecified site: Secondary | ICD-10-CM | POA: Diagnosis not present

## 2019-02-12 DIAGNOSIS — R32 Unspecified urinary incontinence: Secondary | ICD-10-CM | POA: Diagnosis not present

## 2019-02-12 DIAGNOSIS — I10 Essential (primary) hypertension: Secondary | ICD-10-CM | POA: Diagnosis not present

## 2019-02-12 DIAGNOSIS — Z0001 Encounter for general adult medical examination with abnormal findings: Secondary | ICD-10-CM | POA: Diagnosis not present

## 2019-02-12 DIAGNOSIS — E039 Hypothyroidism, unspecified: Secondary | ICD-10-CM | POA: Diagnosis not present

## 2019-03-12 DIAGNOSIS — M961 Postlaminectomy syndrome, not elsewhere classified: Secondary | ICD-10-CM | POA: Diagnosis not present

## 2019-03-12 DIAGNOSIS — M545 Low back pain: Secondary | ICD-10-CM | POA: Diagnosis not present

## 2019-03-15 DIAGNOSIS — Z1231 Encounter for screening mammogram for malignant neoplasm of breast: Secondary | ICD-10-CM | POA: Diagnosis not present

## 2019-04-04 ENCOUNTER — Ambulatory Visit: Payer: Medicare Other | Admitting: Cardiology

## 2019-04-08 ENCOUNTER — Other Ambulatory Visit: Payer: Self-pay

## 2019-04-08 ENCOUNTER — Other Ambulatory Visit (HOSPITAL_COMMUNITY): Payer: Self-pay | Admitting: Urology

## 2019-04-08 ENCOUNTER — Ambulatory Visit (HOSPITAL_COMMUNITY)
Admission: RE | Admit: 2019-04-08 | Discharge: 2019-04-08 | Disposition: A | Discharge status: Home / Self Care | Payer: Medicare Other | Source: Ambulatory Visit | Attending: Urology | Admitting: Urology

## 2019-04-08 DIAGNOSIS — N200 Calculus of kidney: Secondary | ICD-10-CM | POA: Insufficient documentation

## 2019-04-08 DIAGNOSIS — N3946 Mixed incontinence: Secondary | ICD-10-CM | POA: Diagnosis not present

## 2019-04-08 IMAGING — DX ABDOMEN - 1 VIEW
2 series · 2 of 2 positions shown · non-contrast
Comparison: Radiographs [DATE].

CLINICAL DATA: Nephrolithiasis.

EXAM:
ABDOMEN - 1 VIEW

[abdomen kub (1 of 2)]
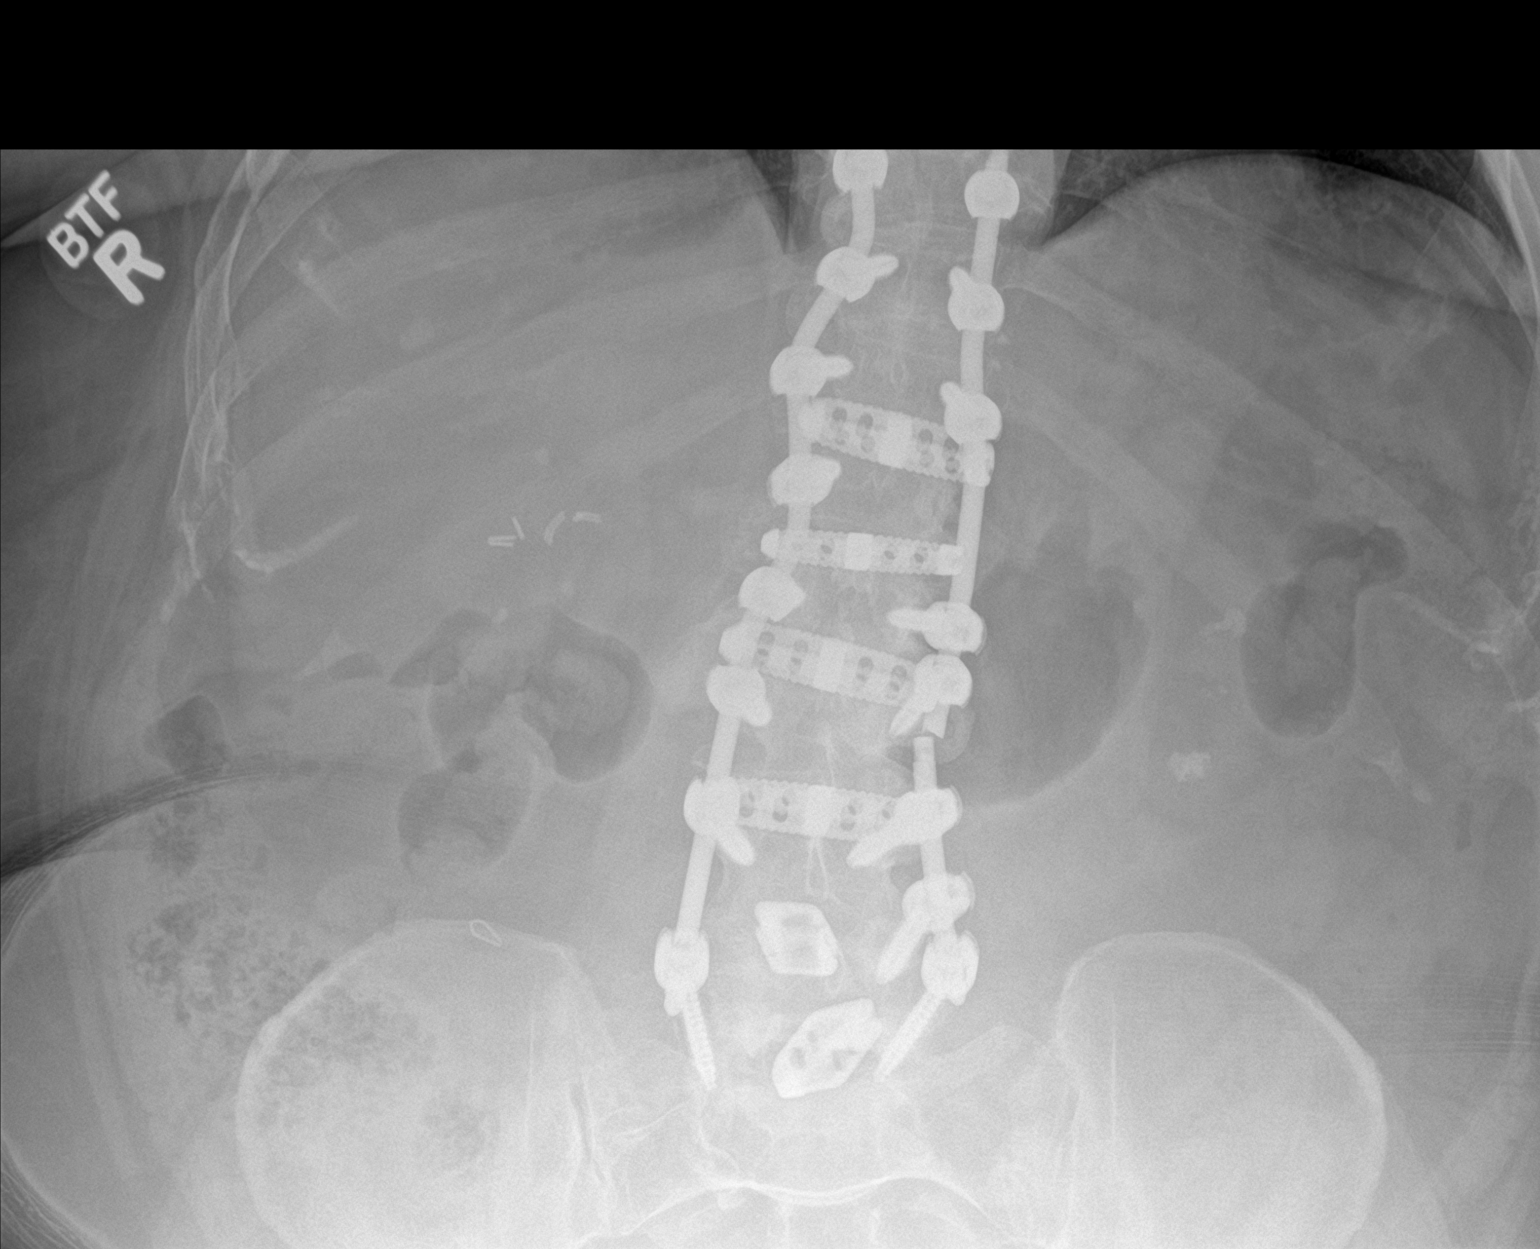

[abdomen kub (2 of 2)]
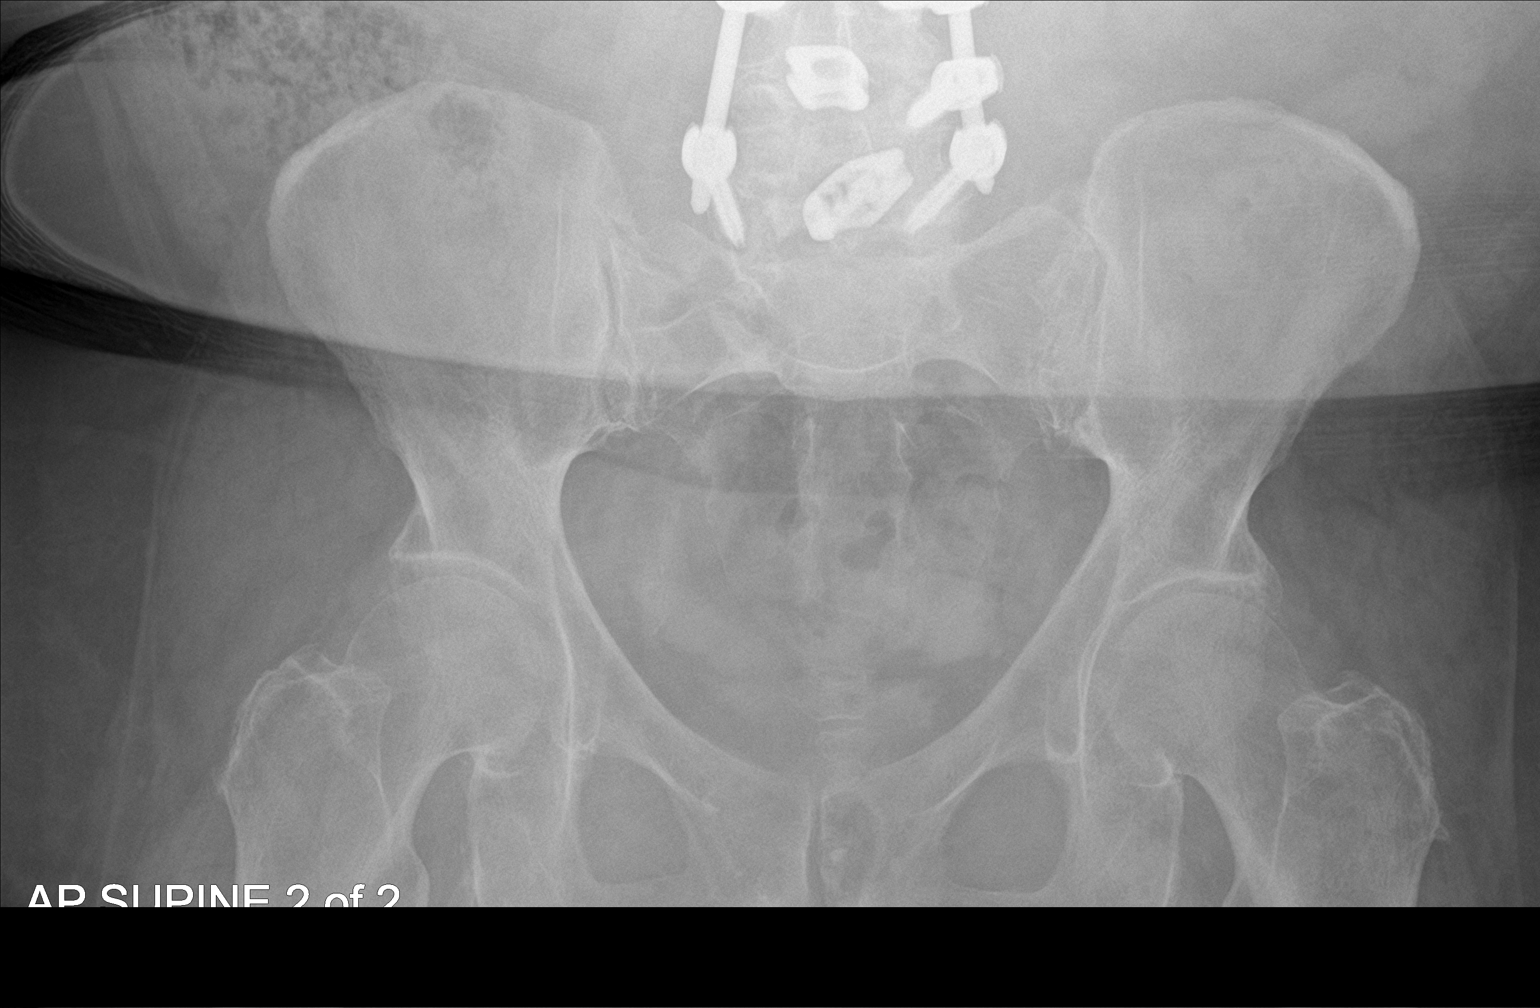

[2 of 2 positions shown; findings below may reference images not displayed]

FINDINGS: The bowel gas pattern is normal. Stable bilateral nephrolithiasis is
noted. Status post cholecystectomy and surgical fusion of lower
thoracic and lumbar spine.
IMPRESSION: Stable bilateral nephrolithiasis. No evidence of bowel obstruction
or ileus.

## 2019-04-10 ENCOUNTER — Telehealth: Payer: Self-pay | Admitting: Cardiology

## 2019-04-10 NOTE — Telephone Encounter (Signed)

## 2019-04-11 ENCOUNTER — Other Ambulatory Visit: Payer: Self-pay

## 2019-04-11 ENCOUNTER — Ambulatory Visit (INDEPENDENT_AMBULATORY_CARE_PROVIDER_SITE_OTHER): Payer: Medicare Other | Admitting: Cardiology

## 2019-04-11 ENCOUNTER — Encounter: Payer: Self-pay | Admitting: Cardiology

## 2019-04-11 VITALS — BP 128/80 | HR 76 | Ht 62.0 in | Wt 223.2 lb

## 2019-04-11 DIAGNOSIS — I340 Nonrheumatic mitral (valve) insufficiency: Secondary | ICD-10-CM | POA: Diagnosis not present

## 2019-04-11 DIAGNOSIS — E039 Hypothyroidism, unspecified: Secondary | ICD-10-CM

## 2019-04-11 DIAGNOSIS — I10 Essential (primary) hypertension: Secondary | ICD-10-CM

## 2019-04-11 DIAGNOSIS — I471 Supraventricular tachycardia: Secondary | ICD-10-CM

## 2019-04-11 NOTE — Patient Instructions (Addendum)
Medication Instructions:  Continue all current medications.  Labwork: none  Testing/Procedures: none  Follow-Up: Your physician wants you to follow up in:  1 year.  You will receive a reminder letter in the mail one-two months in advance.  If you don't receive a letter, please call our office to schedule the follow up appointment - Dr. Harl Bowie.   Any Other Special Instructions Will Be Listed Below (If Applicable).  If you need a refill on your cardiac medications before your next appointment, please call your pharmacy.         Lifestyle Modifications to Prevent and Treat Heart Disease -Recommend heart healthy/Mediterranean diet, with whole grains, fruits, vegetable, fish, lean meats, nuts, and olive oil.  -Limit salt. -Recommend moderate walking, 3-5 times/week for 30-50 minutes each session. Aim for at least 150 minutes.week. Goal should be pace of 3 miles/hours, or walking 1.5 miles in 30 minutes -Recommend avoidance of tobacco products. Avoid excess alcohol. -Keep blood pressure well controlled, ideally less than 130/80.

## 2019-04-11 NOTE — Progress Notes (Signed)
Cardiology Office Note:    Date:  04/11/2019   ID:  Natasha Warren, DOB Aug 23, 1946, MRN [id number]  PCP:  Rory Percy, MD  Cardiologist:  Carlyle Dolly, MD  Referring MD: Rory Percy, MD   Chief Complaint  Patient presents with  . Follow-up    PSVT    History of Present Illness:    Natasha Warren is a 73 y.o. female with a past medical history significant for PSVT, hypertension, hyperlipidemia, hypothyroidism, hyperparathyroidism s/p parathyroidectomy, arthritis and history of gout.  She was last seen in the office on 04/01/2018 by Dr. Harl Bowie.  It was noted that she has a history of intermittent dull chest pain/musculoskeletal pain that had been ongoing for a year.  She had an ER visit for 2019 for chest pain and worse palpitations.  EKG was nonacute, enzymes normal.  She was found to have bone spurs of the shoulder blade by MRI.  Her symptoms resolved.  Echocardiogram in 03/2018 showed normal LV systolic function with EF 60-65%, normal wall motion, grade 1 diastolic dysfunction, mild mitral and tricuspid regurgitation.  She is here today for annual follow-up. She has been doing well. She lives alone and does her own housework, grocery shopping, and prepares meals. She has not been going out much lately due to the pandemic and says she has been gaining weight. She is watching a lot of TV. It keeps her mind off losing her husband who died in 02/05/17.  Her pain from bone spur suddenly resolved and she did not need surgery.   Not having any chest discomfort. She only has mild DOE with more strenuous activity. No orthopnea, PND, edema, significant lightheadedness or syncope. She has had no palpitation or racing heart beat since her initial episode about 3 years ago.    Past Medical History:  Diagnosis Date  . Arthritis   . Bulging lumbar disc   . Colon polyps   . Diabetes mellitus without complication ([place])   . History of gout   . History of gout   . Hyperlipidemia   .  Hyperparathyroidism, primary ([place])   . Hypertension   . Hypothyroidism   . Irritable bowel syndrome   . Nocturia   . Renal disorder    kidney stones  . Seasonal allergies   . SVT (supraventricular tachycardia) ([place])   . Thyroid disorder   . Urolithiasis     Past Surgical History:  Procedure Laterality Date  . ABDOMINAL HYSTERECTOMY  1986  . APPENDECTOMY  1954  . BACK SURGERY  09/2016  . bladder tack  09/1984  . CHOLECYSTECTOMY  01/1999  . COLONOSCOPY  06/2006   This was her second one  . COLONOSCOPY  07/12/2011   Procedure: COLONOSCOPY;  Surgeon: Rogene Houston, MD;  [facility];  Service: Endoscopy;  Laterality: N/A;  10:30 am  . COLONOSCOPY N/A 04/03/2018   Procedure: COLONOSCOPY;  Surgeon: Rogene Houston, MD;  [facility]: [facility];  Service: Endoscopy;  Laterality: N/A;  830  . CYSTOSCOPY W/ URETERAL STENT PLACEMENT Left 10/03/2014   Procedure: CYSTOSCOPY WITH URETERAL STENT PLACEMENT;  Surgeon: Malka So, MD;  Location: [facility];  Service: Urology;  Laterality: Left;  . CYSTOSCOPY WITH URETEROSCOPY AND STENT PLACEMENT Left 03/16/2015   Procedure: CYSTOSCOPY WITH LEFT STENT PLACEMENT;  Surgeon: Franchot Gallo, MD;  Location: [facility];  Service: Urology;  Laterality: Left;  . gout removal  2017  . HOLMIUM LASER APPLICATION Left 7/82/4235   Procedure: HOLMIUM LASER  APPLICATION;  Surgeon: Franchot Gallo, MD;  Location: [facility];  Service: Urology;  Laterality: Left;  . KNEE ARTHROSCOPY  12/2010   Left knee  . LITHOTRIPSY  06/1995  . LUMBAR FUSION  06/2016  . NEPHROLITHOTOMY Left 01/07/2015   Procedure: NEPHROLITHOTOMY PERCUTANEOUS;  Surgeon: Franchot Gallo, MD;  Location: [facility];  Service: Urology;  Laterality: Left;  With STENT  . POLYPECTOMY  04/03/2018   Procedure: POLYPECTOMY;  Surgeon: Rogene Houston, MD;  [facility];  Service: Endoscopy;;  transverse,splenic flexure, sigmoid  . Rotor Cuff  2011   Right Shoulder  . STENT REMOVAL Left  03/16/2015   Procedure: LEFT URETERAL STENT REMOVAL;  Surgeon: Franchot Gallo, MD;  Location: [facility];  Service: Urology;  Laterality: Left;  . THYROID EXPLORATION N/A 07/31/2013   Procedure: neck EXPLORATION;  Surgeon: Odis Hollingshead, MD;  Location: [facility];  Service: General;  Laterality: N/A;  . THYROIDECTOMY N/A 07/31/2013   Procedure: PARATHYROIDECTOMY;  Surgeon: Odis Hollingshead, MD;  Location: [facility];  Service: General;  Laterality: N/A;  . TUMOR REMOVAL  06/1997   Beign; on back x2 surgeries  . TUMOR REMOVAL  2002   "FATTY TUMORS" REMOVED FROM BACK  . URETEROSCOPY WITH HOLMIUM LASER LITHOTRIPSY Left 03/16/2015   Procedure: LEFT URETEROSCOPIC LASER LITHOTRIPSY WITH STONE EXTRACTION;  Surgeon: Franchot Gallo, MD;  Location: [facility];  Service: Urology;  Laterality: Left;    Current Medications: Current Meds  Medication Sig  . acetaminophen (TYLENOL) 500 MG tablet Take 1,000 mg by mouth every 6 (six) hours as needed for moderate pain or headache.   . allopurinol (ZYLOPRIM) 300 MG tablet Take 300 mg by mouth daily.   Marland Kitchen aspirin EC 81 MG tablet Take 81 mg by mouth daily.  Marland Kitchen atorvastatin (LIPITOR) 20 MG tablet Take 20 mg by mouth daily.  . Cholecalciferol (VITAMIN D3) 1000 units CAPS Take 1,000 Units by mouth daily.  Marland Kitchen docusate sodium (COLACE) 50 MG capsule Take 250 mg by mouth daily.   Marland Kitchen gabapentin (NEURONTIN) 300 MG capsule Take 300 mg by mouth at bedtime.  Marland Kitchen levothyroxine (SYNTHROID, LEVOTHROID) 50 MCG tablet Take 50 mcg by mouth daily before breakfast.   . losartan (COZAAR) 100 MG tablet Take 100 mg by mouth daily.   . metFORMIN (GLUCOPHAGE-XR) 500 MG 24 hr tablet Take 500 mg by mouth daily with breakfast.   . metoprolol succinate (TOPROL-XL) 50 MG 24 hr tablet Take 1 & 1/2 tablets daily. (Patient taking differently: Take 75 mg by mouth daily. )  . oxyCODONE-acetaminophen (PERCOCET/ROXICET) 5-325 MG tablet Take 1 tablet by mouth 2 (two) times a day.   . Potassium Citrate 15 MEQ  (1620 MG) TBCR Take 1 tablet by mouth 3 (three) times daily.  . tizanidine (ZANAFLEX) 2 MG capsule Take 2 mg by mouth 3 (three) times daily as needed for muscle spasms.      Allergies:   Ace inhibitors and Venlafaxine   Social History   Socioeconomic History  . Marital status: Widowed    Spouse name: Not on file  . Number of children: Not on file  . Years of education: Not on file  . Highest education level: Not on file  Occupational History  . Not on file  Social Needs  . Financial resource strain: Not on file  . Food insecurity    Worry: Not on file    Inability: Not on file  . Transportation needs    Medical: Not on file  Non-medical: Not on file  Tobacco Use  . Smoking status: Never Smoker  . Smokeless tobacco: Never Used  Substance and Sexual Activity  . Alcohol use: Yes    Comment: Very Rarely 1/2 glass of beer  . Drug use: No  . Sexual activity: Not Currently  Lifestyle  . Physical activity    Days per week: Not on file    Minutes per session: Not on file  . Stress: Not on file  Relationships  . Social Herbalist on phone: Not on file    Gets together: Not on file    Attends religious service: Not on file    Active member of club or organization: Not on file    Attends meetings of clubs or organizations: Not on file    Relationship status: Not on file  Other Topics Concern  . Not on file  Social History Narrative  . Not on file     Family History: The patient's family history includes Arthritis in her mother; Healthy in her brother, daughter, and daughter; Hypertension in her father and mother. There is no history of Colon cancer. ROS:   Please see the history of present illness.     All other systems reviewed and are negative.  EKGs/Labs/Other Studies Reviewed:    The following studies were reviewed today:  Jan 2017 echo Study Conclusions - Left ventricle: The cavity size was normal. Wall thickness was increased in a pattern of  mild LVH. Systolic function was normal. The estimated ejection fraction was in the range of 60% to 65%. Wall motion was normal; there were no regional wall motion abnormalities. Doppler parameters are consistent with abnormal left ventricular relaxation (grade 1 diastolic dysfunction). - Aortic valve: Mildly calcified annulus. - Mitral valve: Calcified annulus. There was mild regurgitation. - Tricuspid valve: There was mild regurgitation.   EKG:  EKG is ordered today.  The ekg ordered today demonstrates NSR, 79 bpm  Recent Labs: No results found for requested labs within last 8760 hours.   Recent Lipid Panel No results found for: CHOL, TRIG, HDL, CHOLHDL, VLDL, LDLCALC, LDLDIRECT  Physical Exam:    VS:  BP 128/80   Pulse 76   Ht 5\' 2"  (1.575 m)   Wt 223 lb 3.2 oz (101.2 kg)   SpO2 96% Comment: on room air  BMI 40.82 kg/m     Wt Readings from Last 3 Encounters:  04/11/19 223 lb 3.2 oz (101.2 kg)  04/03/18 200 lb (90.7 kg)  04/01/18 213 lb 12.8 oz (97 kg)     Physical Exam  Constitutional: She is oriented to person, place, and time. She appears well-developed and well-nourished. No distress.  HENT:  Head: Normocephalic and atraumatic.  Neck: Normal range of motion. Neck supple. No JVD present.  Cardiovascular: Normal rate, regular rhythm and intact distal pulses. Exam reveals no gallop and no friction rub.  Murmur heard. High-pitched blowing holosystolic murmur is present with a grade of 2/6 at the apex. Pulmonary/Chest: Effort normal and breath sounds normal. No respiratory distress. She has no wheezes. She has no rales.  Abdominal: Soft. Bowel sounds are normal.  Musculoskeletal: Normal range of motion.        General: No edema.  Neurological: She is alert and oriented to person, place, and time.  Skin: Skin is warm and dry.  Psychiatric: She has a normal mood and affect. Her behavior is normal. Judgment and thought content normal.  Vitals reviewed.     ASSESSMENT:  1. PSVT (paroxysmal supraventricular tachycardia) ([place])   2. Essential (primary) hypertension   3. Hypothyroidism, unspecified type   4. Mitral valve insufficiency, unspecified etiology    PLAN:    In order of problems listed above:  PSVT: Has been well controlled on beta-blocker. No symptoms since her initial episode 3 years ago.   Hypertension: On losartan 100 mg daily, metoprolol succinate 75 mg daily. BP well controlled.   Hypothyroidism: On thyroid replacement, management per PCP.  Mitral regurgitaiton: mild by echo in 2017. Asymptomatic. 2/6 murmur.   Medication Adjustments/Labs and Tests Ordered: Current medicines are reviewed at length with the patient today.  Concerns regarding medicines are outlined above. Labs and tests ordered and medication changes are outlined in the patient instructions below:  Patient Instructions  Medication Instructions:  Continue all current medications.  Labwork: none  Testing/Procedures: none  Follow-Up: Your physician wants you to follow up in:  1 year.  You will receive a reminder letter in the mail one-two months in advance.  If you don't receive a letter, please call our office to schedule the follow up appointment - Dr. Harl Bowie.   Any Other Special Instructions Will Be Listed Below (If Applicable).  If you need a refill on your cardiac medications before your next appointment, please call your pharmacy.         Lifestyle Modifications to Prevent and Treat Heart Disease -Recommend heart healthy/Mediterranean diet, with whole grains, fruits, vegetable, fish, lean meats, nuts, and olive oil.  -Limit salt. -Recommend moderate walking, 3-5 times/week for 30-50 minutes each session. Aim for at least 150 minutes.week. Goal should be pace of 3 miles/hours, or walking 1.5 miles in 30 minutes -Recommend avoidance of tobacco products. Avoid excess alcohol. -Keep blood pressure well controlled, ideally less than 130/80.       Signed, Daune Perch, NP  04/11/2019 12:36 PM    [facility]

## 2019-04-15 ENCOUNTER — Ambulatory Visit (INDEPENDENT_AMBULATORY_CARE_PROVIDER_SITE_OTHER): Payer: Medicare Other | Admitting: Urology

## 2019-04-15 DIAGNOSIS — N200 Calculus of kidney: Secondary | ICD-10-CM

## 2019-05-14 DIAGNOSIS — Z23 Encounter for immunization: Secondary | ICD-10-CM | POA: Diagnosis not present

## 2019-06-04 DIAGNOSIS — M545 Low back pain: Secondary | ICD-10-CM | POA: Diagnosis not present

## 2019-06-04 DIAGNOSIS — M961 Postlaminectomy syndrome, not elsewhere classified: Secondary | ICD-10-CM | POA: Diagnosis not present

## 2019-06-18 DIAGNOSIS — E7800 Pure hypercholesterolemia, unspecified: Secondary | ICD-10-CM | POA: Diagnosis not present

## 2019-06-18 DIAGNOSIS — E039 Hypothyroidism, unspecified: Secondary | ICD-10-CM | POA: Diagnosis not present

## 2019-06-18 DIAGNOSIS — I10 Essential (primary) hypertension: Secondary | ICD-10-CM | POA: Diagnosis not present

## 2019-07-18 DIAGNOSIS — E7800 Pure hypercholesterolemia, unspecified: Secondary | ICD-10-CM | POA: Diagnosis not present

## 2019-07-18 DIAGNOSIS — M545 Low back pain: Secondary | ICD-10-CM | POA: Diagnosis not present

## 2019-07-18 DIAGNOSIS — E039 Hypothyroidism, unspecified: Secondary | ICD-10-CM | POA: Diagnosis not present

## 2019-07-18 DIAGNOSIS — I10 Essential (primary) hypertension: Secondary | ICD-10-CM | POA: Diagnosis not present

## 2019-07-22 ENCOUNTER — Other Ambulatory Visit: Payer: Self-pay | Admitting: *Deleted

## 2019-07-22 DIAGNOSIS — Z20828 Contact with and (suspected) exposure to other viral communicable diseases: Secondary | ICD-10-CM | POA: Diagnosis not present

## 2019-07-22 DIAGNOSIS — Z20822 Contact with and (suspected) exposure to covid-19: Secondary | ICD-10-CM

## 2019-07-23 LAB — NOVEL CORONAVIRUS, NAA: SARS-CoV-2, NAA: NOT DETECTED

## 2019-08-26 DIAGNOSIS — I10 Essential (primary) hypertension: Secondary | ICD-10-CM | POA: Diagnosis not present

## 2019-08-26 DIAGNOSIS — Z6841 Body Mass Index (BMI) 40.0 and over, adult: Secondary | ICD-10-CM | POA: Diagnosis not present

## 2019-08-26 DIAGNOSIS — M545 Low back pain: Secondary | ICD-10-CM | POA: Diagnosis not present

## 2019-09-09 ENCOUNTER — Encounter: Payer: Self-pay | Admitting: Urology

## 2019-10-17 DIAGNOSIS — E039 Hypothyroidism, unspecified: Secondary | ICD-10-CM | POA: Diagnosis not present

## 2019-10-17 DIAGNOSIS — I10 Essential (primary) hypertension: Secondary | ICD-10-CM | POA: Diagnosis not present

## 2019-11-04 ENCOUNTER — Other Ambulatory Visit: Payer: Self-pay

## 2019-11-04 ENCOUNTER — Telehealth: Payer: Self-pay | Admitting: Urology

## 2019-11-04 DIAGNOSIS — N200 Calculus of kidney: Secondary | ICD-10-CM

## 2019-11-04 NOTE — Telephone Encounter (Signed)
Order placed. Pt notified.  

## 2019-11-04 NOTE — Telephone Encounter (Signed)
Patient states she has appointment next Tuesday for office visit. She said she normally has a xray before the appt but an order hasnt been sent in.

## 2019-11-05 ENCOUNTER — Other Ambulatory Visit: Payer: Self-pay

## 2019-11-05 ENCOUNTER — Ambulatory Visit (HOSPITAL_COMMUNITY)
Admission: RE | Admit: 2019-11-05 | Discharge: 2019-11-05 | Disposition: A | Discharge status: Home / Self Care | Payer: Medicare Other | Source: Ambulatory Visit | Attending: Urology | Admitting: Urology

## 2019-11-05 DIAGNOSIS — N200 Calculus of kidney: Secondary | ICD-10-CM | POA: Insufficient documentation

## 2019-11-05 IMAGING — DX DG ABDOMEN 1V
2 series · 2 of 2 positions shown · non-contrast
Comparison: [DATE].  CT [DATE].

CLINICAL DATA: Kidney stone.

EXAM:
ABDOMEN - 1 VIEW

[abdomen kub (1 of 2)]
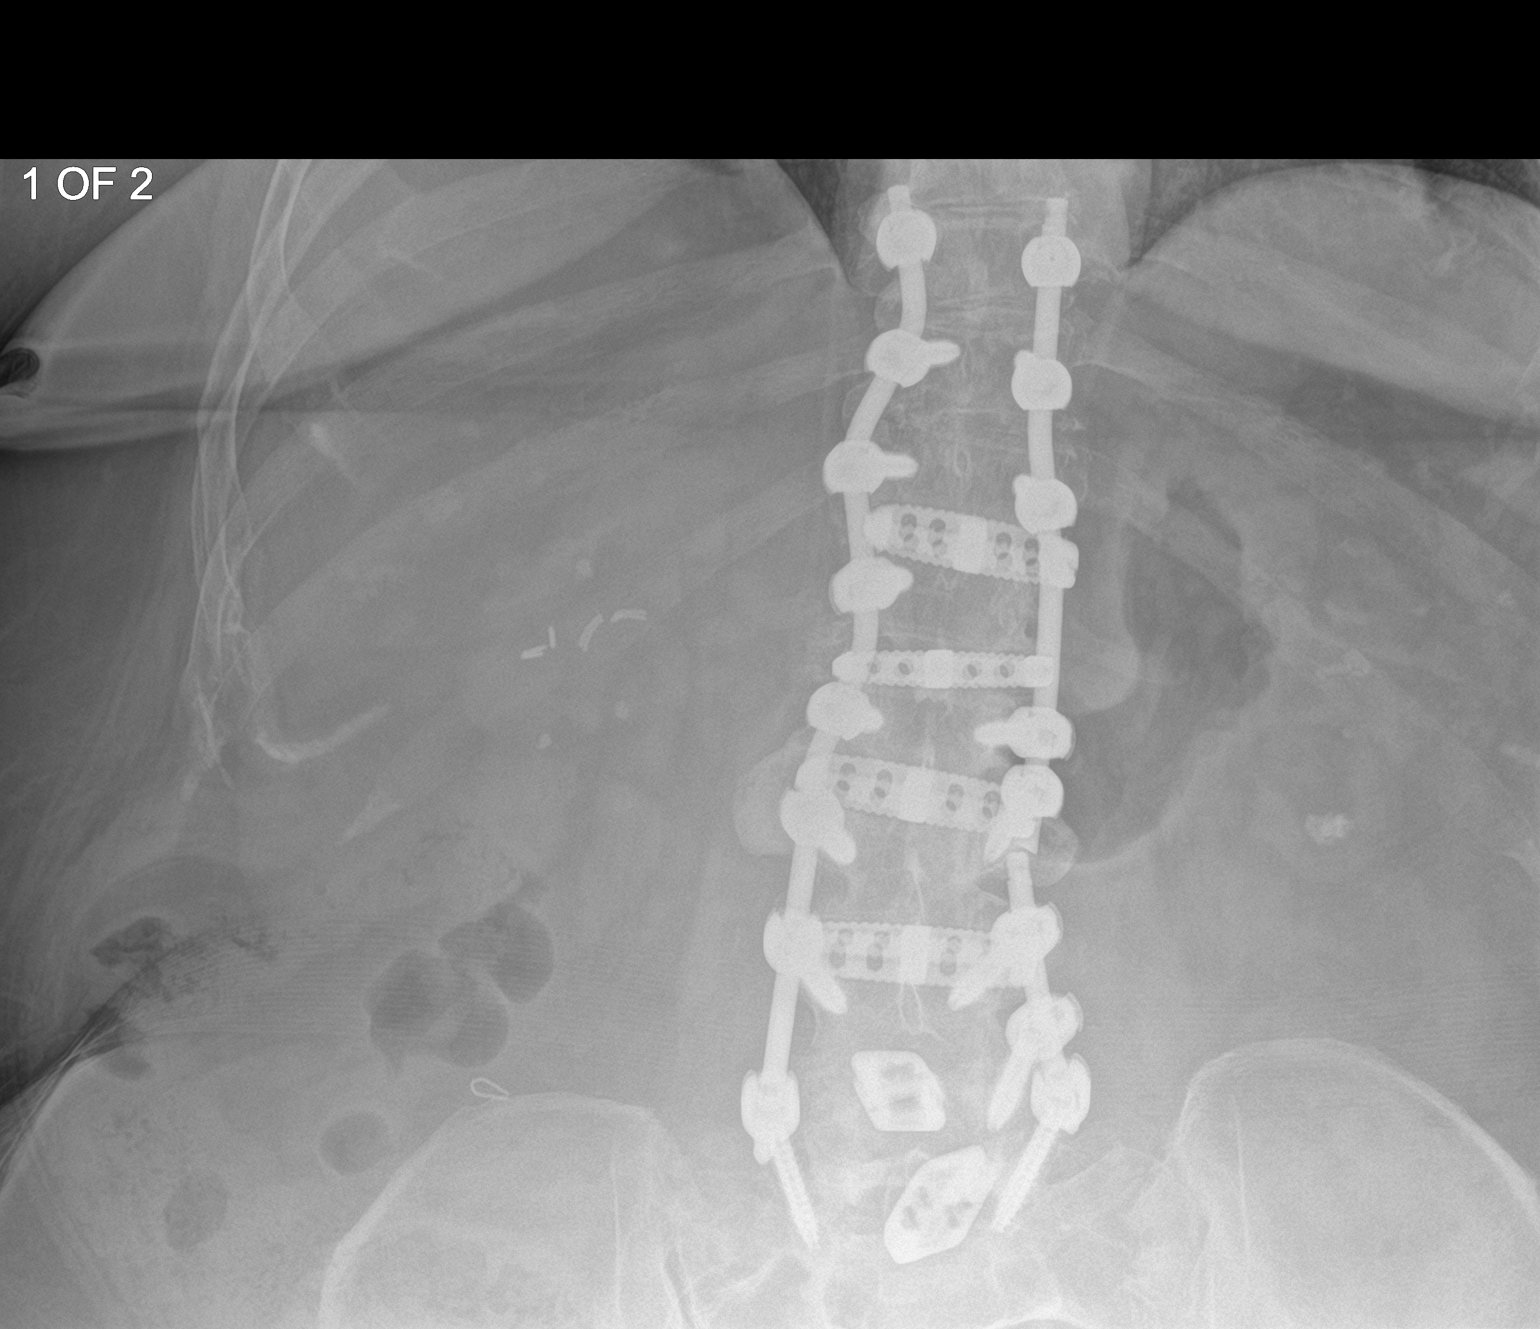

[abdomen kub (2 of 2)]
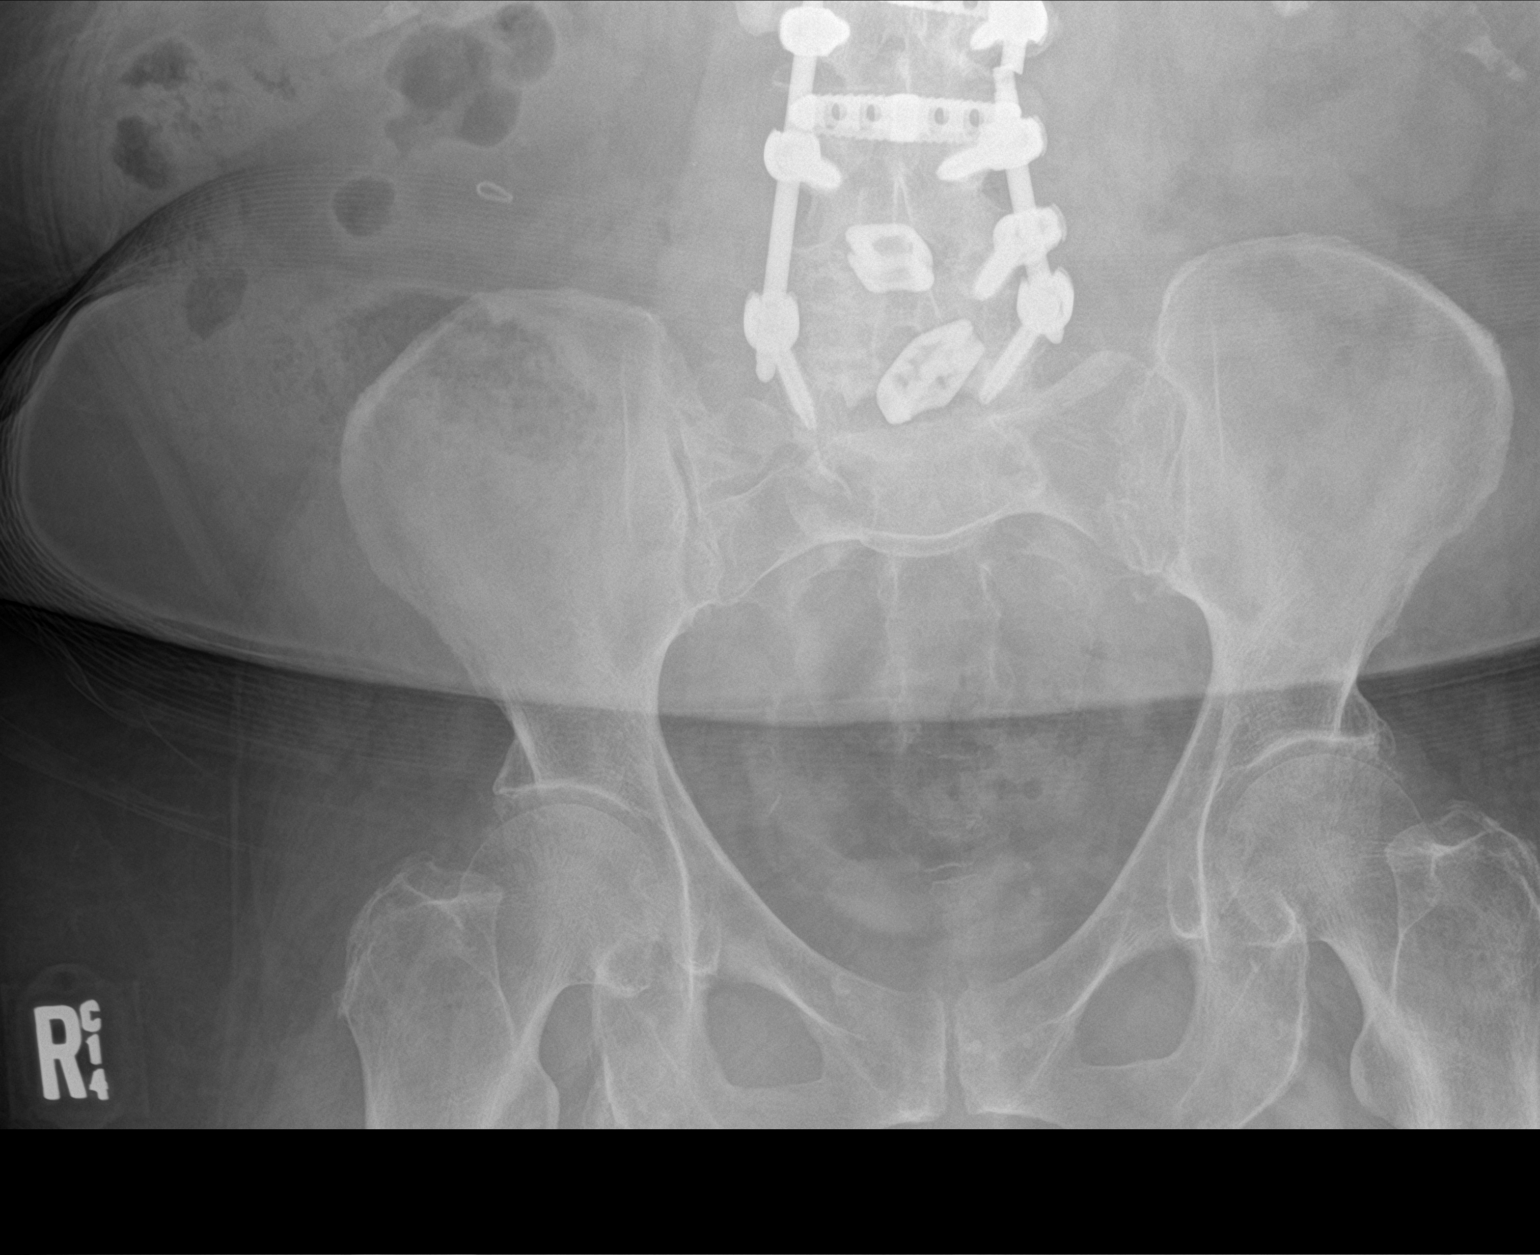

[2 of 2 positions shown; findings below may reference images not displayed]

FINDINGS: Surgical clips right upper quadrant. Stable bilateral
nephrolithiasis. Stable tiny punctate calcifications in the left
lower pelvis consistent with phleboliths. No evidence of ureteral
stone. No bowel distention or free air. Thoracolumbar spine fusion.
No acute bony abnormality identified.
IMPRESSION: Stable bilateral nephrolithiasis. No evidence of ureteral stone. No
acute abnormality identified.

## 2019-11-11 ENCOUNTER — Encounter: Payer: Self-pay | Admitting: Urology

## 2019-11-11 ENCOUNTER — Ambulatory Visit (INDEPENDENT_AMBULATORY_CARE_PROVIDER_SITE_OTHER): Payer: Medicare Other | Admitting: Urology

## 2019-11-11 ENCOUNTER — Other Ambulatory Visit: Payer: Self-pay

## 2019-11-11 VITALS — BP 128/86 | HR 73 | Temp 97.7°F | Ht 63.0 in | Wt 200.0 lb

## 2019-11-11 DIAGNOSIS — N200 Calculus of kidney: Secondary | ICD-10-CM | POA: Diagnosis not present

## 2019-11-11 LAB — POCT URINALYSIS DIPSTICK
Bilirubin, UA: NEGATIVE
Glucose, UA: NEGATIVE
Ketones, UA: NEGATIVE
Nitrite, UA: NEGATIVE
Protein, UA: NEGATIVE
Spec Grav, UA: 1.01 (ref 1.010–1.025)
Urobilinogen, UA: NEGATIVE E.U./dL — AB
pH, UA: 8.5 — AB (ref 5.0–8.0)

## 2019-11-11 NOTE — Progress Notes (Signed)
Urological Symptom Review  Patient is experiencing the following symptoms: Frequent urination Leakage of urine   Review of Systems  Gastrointestinal (upper)  : Negative for upper GI symptoms  Gastrointestinal (lower) : Constipation  Constitutional : Negative for symptoms  Skin: Negative for skin symptoms  Eyes: Negative for eye symptoms  Ear/Nose/Throat : Negative for Ear/Nose/Throat symptoms  Hematologic/Lymphatic: Negative for Hematologic/Lymphatic symptoms  Cardiovascular : Negative for cardiovascular symptoms  Respiratory : Negative for respiratory symptoms  Endocrine: Negative for endocrine symptoms  Musculoskeletal: Back pain  Neurological: Negative for neurological symptoms  Psychologic: Negative for psychiatric symptoms.r

## 2019-11-11 NOTE — Progress Notes (Signed)
H&P  Chief Complaint: Kidney Stones  History of Present Illness: Natasha [place] is a 74 y.o. year old female  2.23.2021: Here today for follow-up. She continues on 15 mEq potassium citrate 3x daily. She denies any adverse effects and this is not very cost prohibitive for her. She continues to have diverticulosis pain that is difficult for her to distinguish from the beginning of a kidney stone passing -- she denies any true stone incidents in the interval. Most recent KUB shows bilateral stones are stable in size/position.  (below copied from [facility] records):  Kidney stones:  Natasha [place] is an established patient who is here for renal calculi after a surgical intervention.  The problem is on both sides. She had percutaneous lithotomy for treatment of her renal calculi. Patient denies stent, ureteroscopy, and eswl. This procedure was done 01/07/2015.   Left PCNL performed on 4.21.2016. There were a few fragments left due to difficult anatomy.   1.2019: Recent CT scan of abdomen and pelvis from 06/2017 reviewed. Scarring about the left kidney. Mild renal atrophy bilaterally. 2 calculi in the right kidney, the largest approximately 4-5 mm. 2 dominant calculi in the left kidney, the largest of which is slightly branched in the lower pole measuring 11 x 11 mm. Interpolar stone measuring 10 mm on the left. No evidence of ureterolithiasis. No obstruction.   It was recommended that she get back on K cit 15 mEq tid.   7.9.2019: recent KUB revealed stable left renal calculi (12 and 11 mm) as well as 3 separate 4 mm right renal calculi. She usually takes the Kcit as prescribed but the 15 mEq dose is expensive.   1.21.2020: No real issues regarding her stones over the past 6 months. Recent KUB revealed stable stone size/location. She is still on potassium citrate.   7.28.2020: Here today for follow-up. She reports that she has diverticulitis and that it is hard to distinguish that pain from her KS  pain -- though this usually resolves with Tylenol and a day of rest. Recent KUB still shows stable stone size and location. She has been on potassium citrate 4 times a day but expresses that she would like to return to 3 times day. She denies any urinary sx's since last visit. 11 and 12 mm stones in left kidney and 3 small stones in right.    Past Medical History:  Diagnosis Date  . Arthritis   . Bulging lumbar disc   . Colon polyps   . Diabetes mellitus without complication ([place])   . History of gout   . History of gout   . Hyperlipidemia   . Hyperparathyroidism, primary ([place])   . Hypertension   . Hypothyroidism   . Irritable bowel syndrome   . Nocturia   . Renal disorder    kidney stones  . Seasonal allergies   . SVT (supraventricular tachycardia) ([place])   . Thyroid disorder   . Urolithiasis     Past Surgical History:  Procedure Laterality Date  . ABDOMINAL HYSTERECTOMY  1986  . APPENDECTOMY  1954  . BACK SURGERY  09/2016  . bladder tack  09/1984  . CHOLECYSTECTOMY  01/1999  . COLONOSCOPY  06/2006   This was her second one  . COLONOSCOPY  07/12/2011   Procedure: COLONOSCOPY;  Surgeon: Rogene Houston, MD;  [facility];  Service: Endoscopy;  Laterality: N/A;  10:30 am  . COLONOSCOPY N/A 04/03/2018   Procedure: COLONOSCOPY;  Surgeon: Rogene Houston, MD;  [facility];  Service: Endoscopy;  Laterality: N/A;  830  . CYSTOSCOPY W/ URETERAL STENT PLACEMENT Left 10/03/2014   Procedure: CYSTOSCOPY WITH URETERAL STENT PLACEMENT;  Surgeon: Malka So, MD;  [facility];  Service: Urology;  Laterality: Left;  . CYSTOSCOPY WITH URETEROSCOPY AND STENT PLACEMENT Left 03/16/2015   Procedure: CYSTOSCOPY WITH LEFT STENT PLACEMENT;  Surgeon: Franchot Gallo, MD;  [facility]: [facility];  Service: Urology;  Laterality: Left;  . gout removal  2017  . HOLMIUM LASER APPLICATION Left 01/26/2584   Procedure: HOLMIUM LASER APPLICATION;  Surgeon: Franchot Gallo, MD;   [facility];  Service: Urology;  Laterality: Left;  . KNEE ARTHROSCOPY  12/2010   Left knee  . LITHOTRIPSY  06/1995  . LUMBAR FUSION  06/2016  . NEPHROLITHOTOMY Left 01/07/2015   Procedure: NEPHROLITHOTOMY PERCUTANEOUS;  Surgeon: Franchot Gallo, MD;  [facility];  Service: Urology;  Laterality: Left;  With STENT  . POLYPECTOMY  04/03/2018   Procedure: POLYPECTOMY;  Surgeon: Rogene Houston, MD;  [facility];  Service: Endoscopy;;  transverse,splenic flexure, sigmoid  . Rotor Cuff  2011   Right Shoulder  . STENT REMOVAL Left 03/16/2015   Procedure: LEFT URETERAL STENT REMOVAL;  Surgeon: Franchot Gallo, MD;  [facility];  Service: Urology;  Laterality: Left;  . THYROID EXPLORATION N/A 07/31/2013   Procedure: neck EXPLORATION;  Surgeon: Odis Hollingshead, MD;  [facility];  Service: General;  Laterality: N/A;  . THYROIDECTOMY N/A 07/31/2013   Procedure: PARATHYROIDECTOMY;  Surgeon: Odis Hollingshead, MD;  [facility];  Service: General;  Laterality: N/A;  . TUMOR REMOVAL  06/1997   Beign; on back x2 surgeries  . TUMOR REMOVAL  2002   "FATTY TUMORS" REMOVED FROM BACK  . URETEROSCOPY WITH HOLMIUM LASER LITHOTRIPSY Left 03/16/2015   Procedure: LEFT URETEROSCOPIC LASER LITHOTRIPSY WITH STONE EXTRACTION;  Surgeon: Franchot Gallo, MD;  [facility];  Service: Urology;  Laterality: Left;    Home Medications:  (Not in a hospital admission)   Allergies:  Allergies  Allergen Reactions  . Ace Inhibitors Other (See Comments)    Patient doesn't remember this allergy.  On 12/30/2014 received LOV visit from PCP , Dr Rory Percy in [place], [place].  - No known active drug allergies listed in his office visit note dated 05/21/2014.    Marland Kitchen Venlafaxine Anxiety    Family History  Problem Relation Age of Onset  . Hypertension Mother   . Arthritis Mother   . Hypertension Father   . Healthy Brother   . Healthy Daughter   . Healthy Daughter   . Colon cancer Neg  Hx     Social History:  reports that she has never smoked. She has never used smokeless tobacco. She reports current alcohol use. She reports that she does not use drugs.  ROS: A complete review of systems was performed.  All systems are negative except for pertinent findings as noted.  Physical Exam:  Vital signs in last 24 hours: Temp:  [97.7 F (36.5 C)] 97.7 F (36.5 C) (02/23 1036) Pulse Rate:  [73] 73 (02/23 1036) BP: (128)/(86) 128/86 (02/23 1036) Weight:  [200 lb (90.7 kg)] 200 lb (90.7 kg) (02/23 1036) General:  Alert and oriented, No acute distress HEENT: Normocephalic, atraumatic Neck: No JVD or lymphadenopathy Cardiovascular: Regular rate  Lungs: Nml respiration Extremities: No edema Neurologic: Grossly intact  Laboratory Data:  Results for orders placed or performed in visit on 11/11/19 (from the  past 24 hour(s))  POCT urinalysis dipstick     Status: Abnormal   Collection Time: 11/11/19 10:56 AM  Result Value Ref Range   Color, UA yellow    Clarity, UA clear    Glucose, UA Negative Negative   Bilirubin, UA neg    Ketones, UA neg    Spec Grav, UA 1.010 1.010 - 1.025   Blood, UA intact    pH, UA 8.5 (A) 5.0 - 8.0   Protein, UA Negative Negative   Urobilinogen, UA negative (A) 0.2 or 1.0 E.U./dL   Nitrite, UA neg    Leukocytes, UA Trace (A) Negative   Appearance clear    Odor      I have reviewed prior pt notes  I have reviewed urinalysis results  I have independently reviewed prior imaging as well as KUB done recently   Impression/Assessment:  Her stones remain stable in size and location with no recent stone episodes. She remains on potassium citrate 3x daily -- urine pH today 8.5 (a bit high). She has been doing very well thus far and I offered moving our surveillance schedule to once a year -- she would like to stay on a q 6 month schedule due to anxieties stemming from how severe her previous stones have been.  Plan:  1. She would like to return  in 6 mo for OV w/ KUB  2. Continue on current dose of potassium citrate.  Dena Billet 11/11/2019, 11:00 AM  [name]. Stefhanie Kachmar MD

## 2019-11-17 DIAGNOSIS — M545 Low back pain: Secondary | ICD-10-CM | POA: Diagnosis not present

## 2019-11-17 DIAGNOSIS — I10 Essential (primary) hypertension: Secondary | ICD-10-CM | POA: Diagnosis not present

## 2019-11-17 DIAGNOSIS — Z6841 Body Mass Index (BMI) 40.0 and over, adult: Secondary | ICD-10-CM | POA: Diagnosis not present

## 2019-11-21 DIAGNOSIS — Z23 Encounter for immunization: Secondary | ICD-10-CM | POA: Diagnosis not present

## 2019-12-17 DIAGNOSIS — E7849 Other hyperlipidemia: Secondary | ICD-10-CM | POA: Diagnosis not present

## 2019-12-17 DIAGNOSIS — I10 Essential (primary) hypertension: Secondary | ICD-10-CM | POA: Diagnosis not present

## 2019-12-18 DIAGNOSIS — Z23 Encounter for immunization: Secondary | ICD-10-CM | POA: Diagnosis not present

## 2020-01-01 ENCOUNTER — Other Ambulatory Visit: Payer: Self-pay

## 2020-01-01 DIAGNOSIS — N200 Calculus of kidney: Secondary | ICD-10-CM

## 2020-01-01 MED ORDER — POTASSIUM CITRATE ER 15 MEQ (1620 MG) PO TBCR: 1.0000 | EXTENDED_RELEASE_TABLET | Freq: Three times a day (TID) | ORAL | 11 refills | Status: DC

## 2020-02-17 DIAGNOSIS — Z6841 Body Mass Index (BMI) 40.0 and over, adult: Secondary | ICD-10-CM | POA: Diagnosis not present

## 2020-02-17 DIAGNOSIS — M545 Low back pain: Secondary | ICD-10-CM | POA: Diagnosis not present

## 2020-02-17 DIAGNOSIS — I10 Essential (primary) hypertension: Secondary | ICD-10-CM | POA: Diagnosis not present

## 2020-02-25 DIAGNOSIS — M47816 Spondylosis without myelopathy or radiculopathy, lumbar region: Secondary | ICD-10-CM | POA: Diagnosis not present

## 2020-02-25 DIAGNOSIS — M4126 Other idiopathic scoliosis, lumbar region: Secondary | ICD-10-CM | POA: Diagnosis not present

## 2020-02-25 DIAGNOSIS — Z6841 Body Mass Index (BMI) 40.0 and over, adult: Secondary | ICD-10-CM | POA: Diagnosis not present

## 2020-02-25 DIAGNOSIS — M545 Low back pain: Secondary | ICD-10-CM | POA: Diagnosis not present

## 2020-02-25 DIAGNOSIS — M961 Postlaminectomy syndrome, not elsewhere classified: Secondary | ICD-10-CM | POA: Diagnosis not present

## 2020-02-25 DIAGNOSIS — M5416 Radiculopathy, lumbar region: Secondary | ICD-10-CM | POA: Diagnosis not present

## 2020-03-01 ENCOUNTER — Other Ambulatory Visit: Payer: Self-pay | Admitting: Neurosurgery

## 2020-03-01 DIAGNOSIS — M4126 Other idiopathic scoliosis, lumbar region: Secondary | ICD-10-CM

## 2020-03-08 ENCOUNTER — Ambulatory Visit (HOSPITAL_COMMUNITY)
Admission: RE | Admit: 2020-03-08 | Discharge: 2020-03-08 | Disposition: A | Discharge status: Home / Self Care | Payer: Medicare Other | Source: Ambulatory Visit | Attending: Neurosurgery | Admitting: Neurosurgery

## 2020-03-08 ENCOUNTER — Other Ambulatory Visit: Payer: Self-pay

## 2020-03-08 DIAGNOSIS — M4126 Other idiopathic scoliosis, lumbar region: Secondary | ICD-10-CM | POA: Insufficient documentation

## 2020-03-08 IMAGING — CT CT T SPINE W/O CM
3 series · 11 of 33 positions shown, 13 images · non-contrast
Comparison: None.

CLINICAL DATA: Back pain radiating to the left hip and posterior
knee for 3 months

EXAM:
CT THORACIC AND LUMBAR SPINE WITHOUT CONTRAST
TECHNIQUE: Multidetector CT imaging of the thoracic and lumbar spine was
performed without contrast. Multiplanar CT image reconstructions
were also generated.

[Series 4: t spine soft · axial · 0.33mm/px · z∈[-116,+68]mm · 3 of 150 slices shown, 4 images]
[im 35/150  soft-tissue]
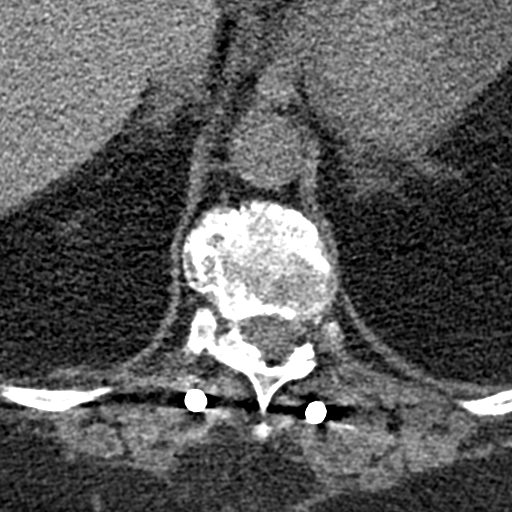
[im 35/150  bone]
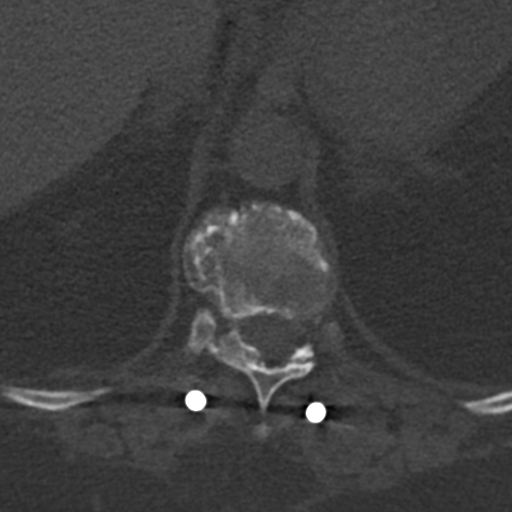
[im 81/150  bone]
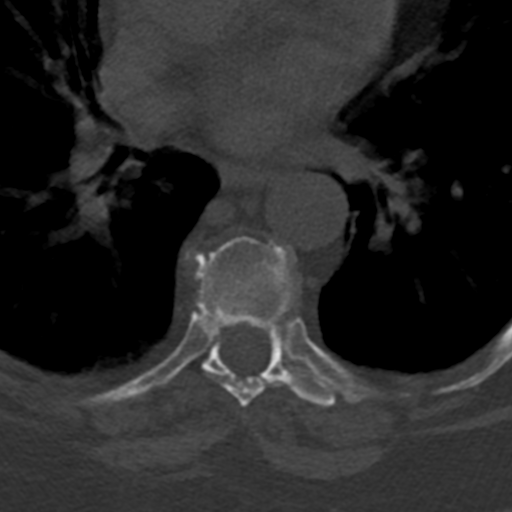
[im 127/150  bone]
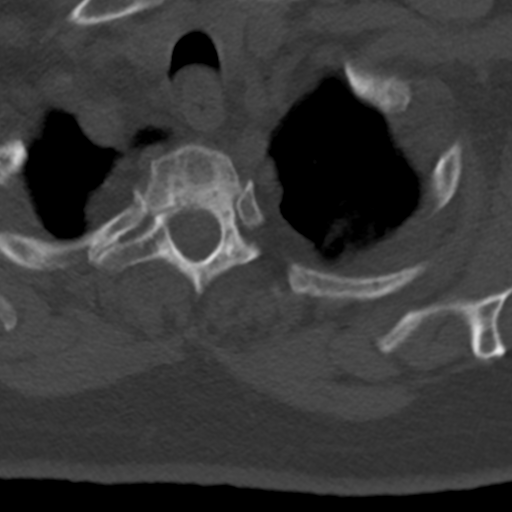

[Series 5: sag bone · sagittal · 0.31mm/px · 5 of 82 slices shown, 6 images]
[im 28/82  bone]
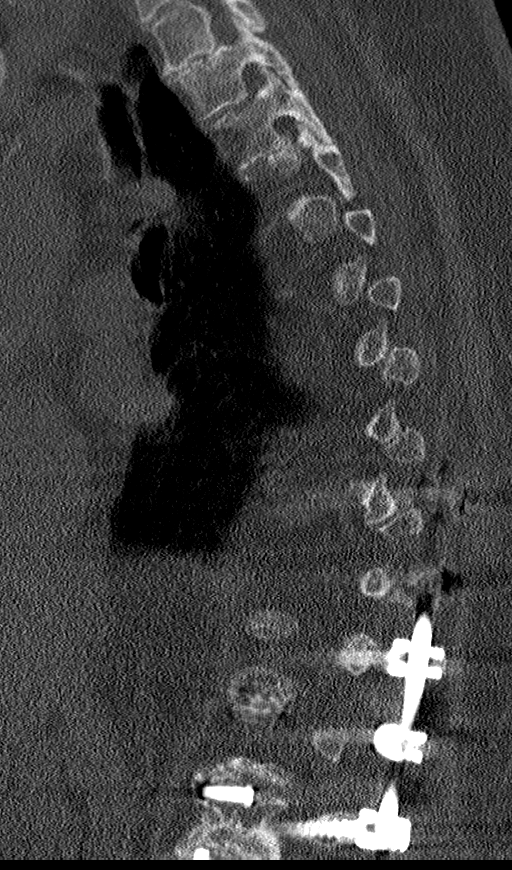
[im 34/82  bone]
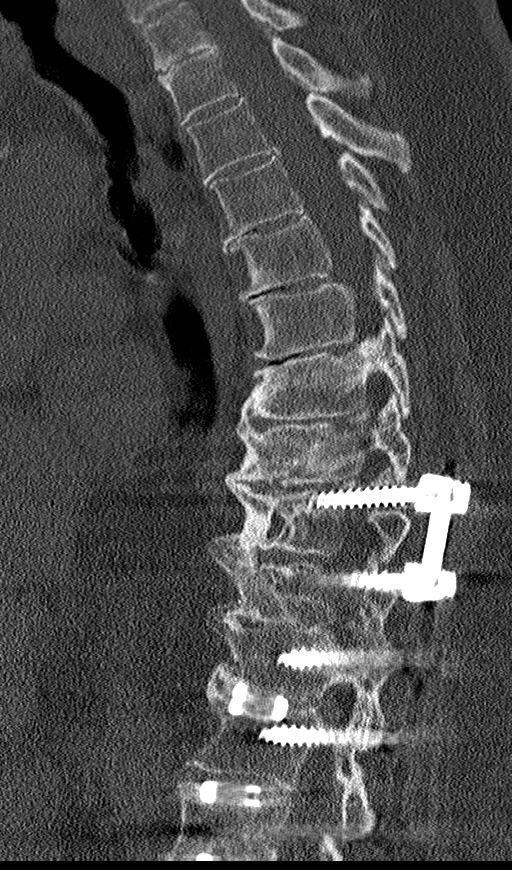
[im 41/82  soft-tissue]
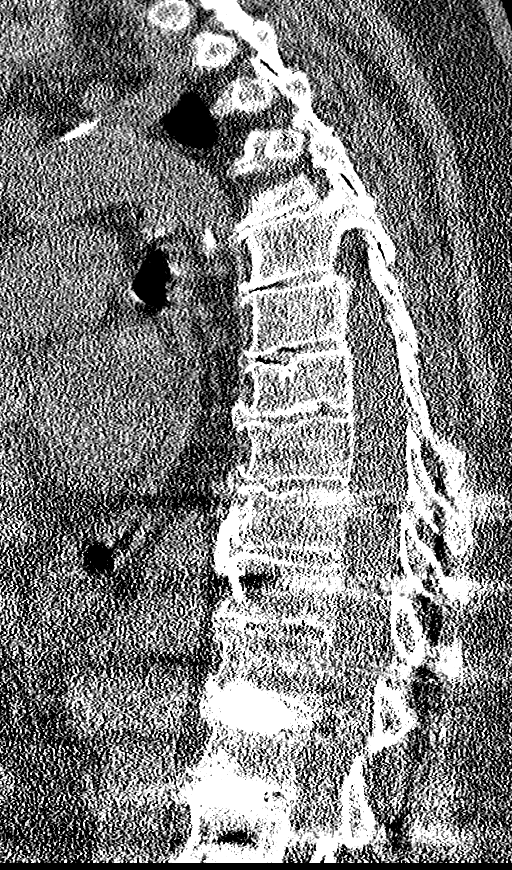
[im 41/82  bone]
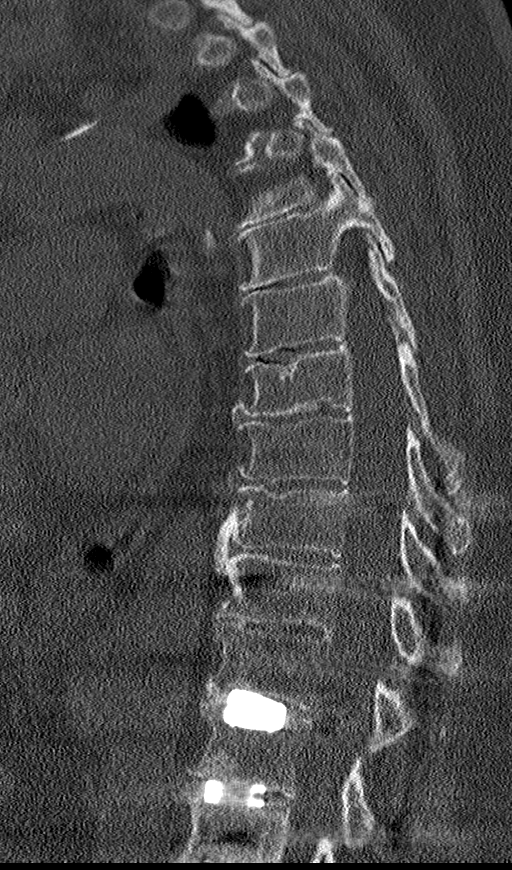
[im 48/82  bone]
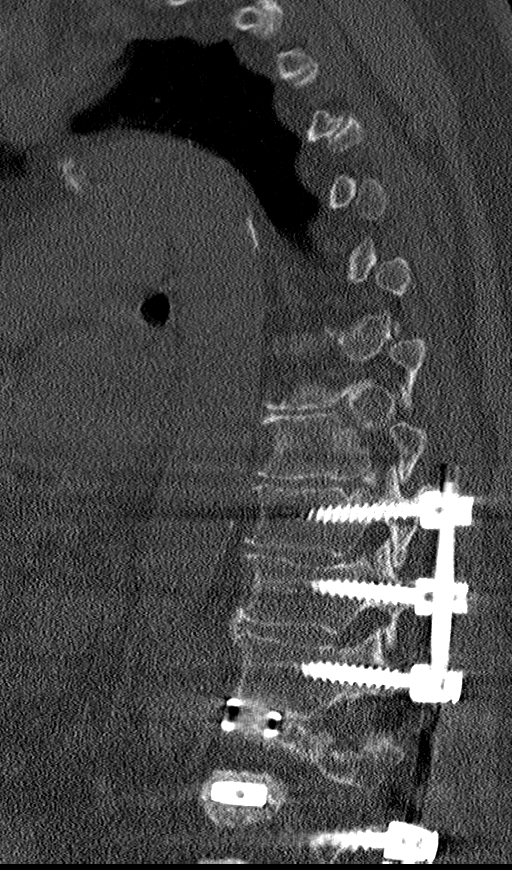
[im 55/82  bone]
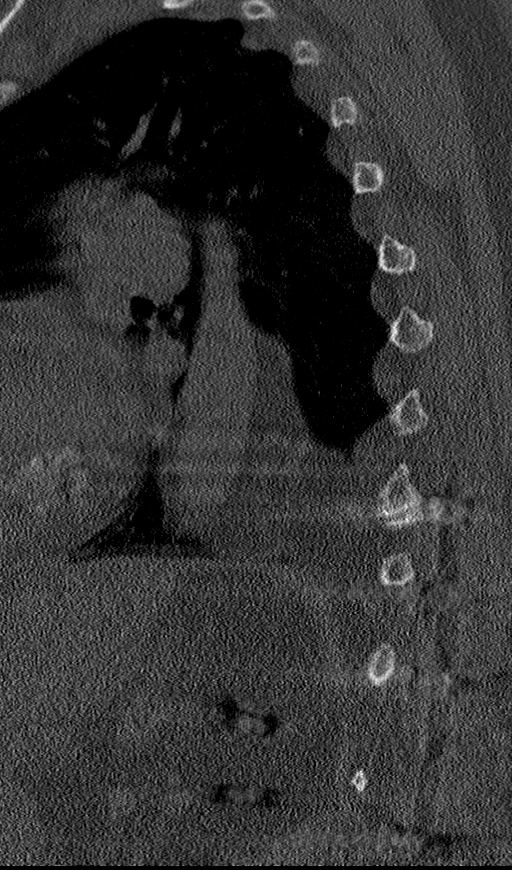

[Series 6: cor bone · coronal · 0.31mm/px · 3 of 86 slices shown]
[im 18/86  bone]
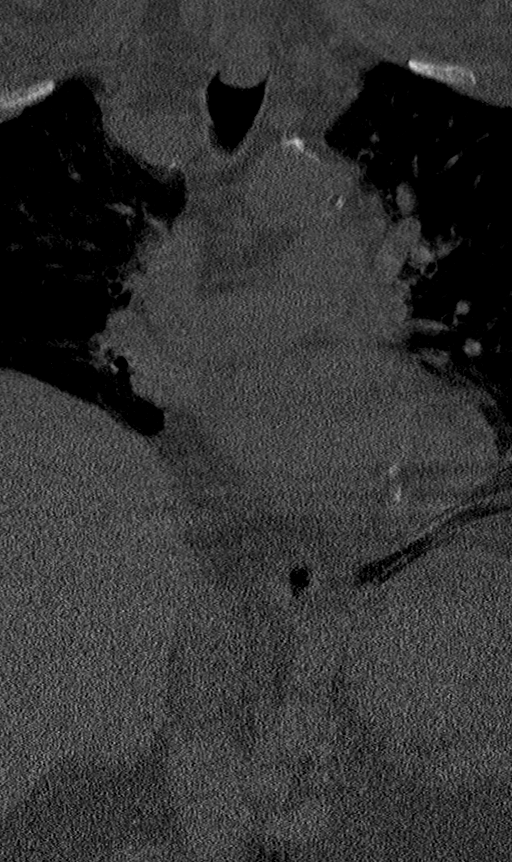
[im 35/86  bone]
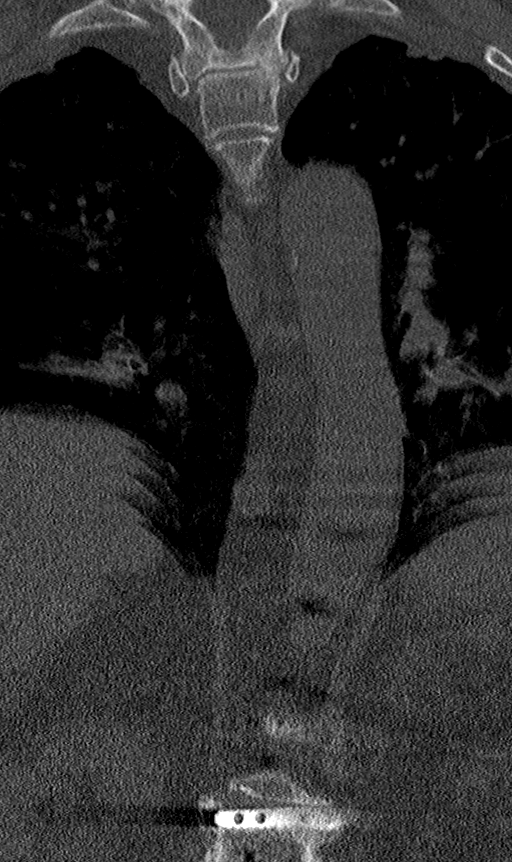
[im 52/86  bone]
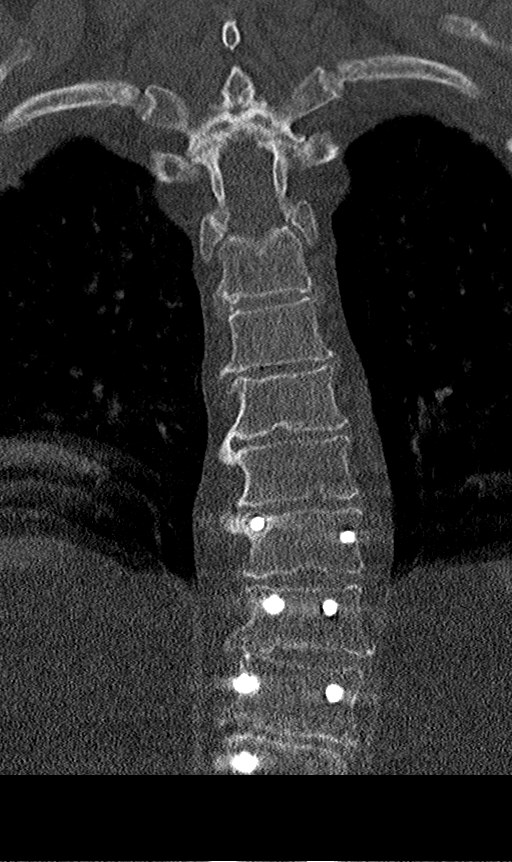

[11 of 33 positions shown; findings below may reference images not displayed]

FINDINGS: CT THORACIC SPINE FINDINGS

Alignment: Normal

Vertebrae: Posterior instrumented fusion extends from T10-L1 and
L2-L5 on the right and T10-L2 and L3-L5 on the left. There is no
abnormal lucency surrounding the hardware. There is no acute
fracture.

Paraspinal and other soft tissues: There is calcific aortic
atherosclerosis.

Disc levels: No thoracic spinal canal stenosis.

CT LUMBAR SPINE FINDINGS

Segmentation: Standard

Alignment: Normal

Vertebrae: Thoracolumbar spinal fusion as outlined above. No acute
fracture. There is lucency surrounding the right L5 screw.

Paraspinal and other soft tissues: No acute retroperitoneal
abnormality.

Disc levels: Spinal canal is obscured at multiple levels by streak
artifact from hardware. There is no visible canal stenosis. There is
mild bilateral foraminal stenosis at L4-5. Otherwise, the neural
foramina are patent.
IMPRESSION: 1. Thoracolumbar spinal fusion with lucency surrounding the right L5
screw, which may indicate loosening.
2. No acute fracture or static subluxation of the thoracic or lumbar
spine.
3. Mild bilateral L4-5 foraminal stenosis.
4. Aortic Atherosclerosis ([1S]-[1S]).

## 2020-03-08 IMAGING — CT CT L SPINE W/O CM
3 series · 14 of 33 positions shown, 17 images · non-contrast
Comparison: None.

CLINICAL DATA: Back pain radiating to the left hip and posterior
knee for 3 months

EXAM:
CT THORACIC AND LUMBAR SPINE WITHOUT CONTRAST
TECHNIQUE: Multidetector CT imaging of the thoracic and lumbar spine was
performed without contrast. Multiplanar CT image reconstructions
were also generated.

[Series 5: l spine soft · axial · 0.35mm/px · z∈[-304,-158]mm · 6 of 96 slices shown, 8 images]
[im 15/96  soft-tissue]
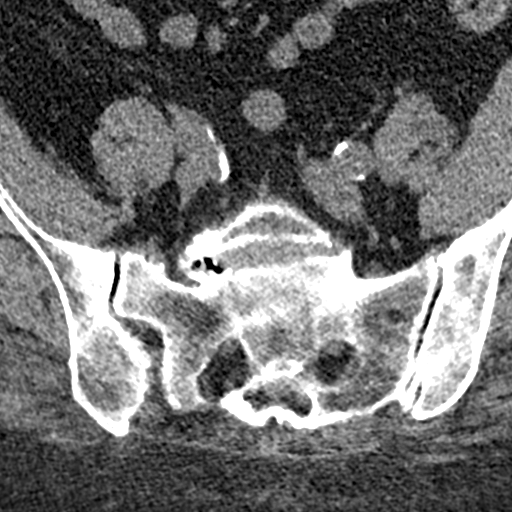
[im 15/96  bone]
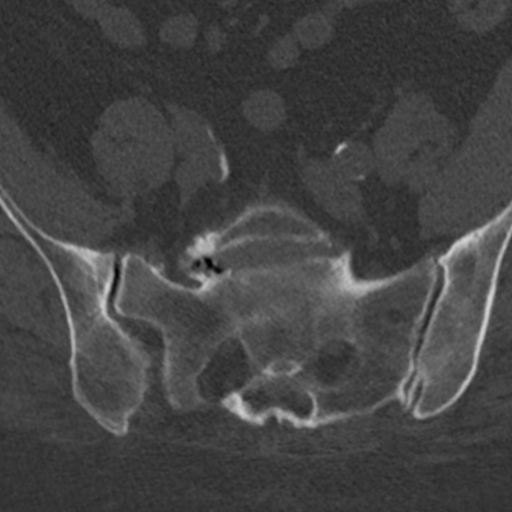
[im 30/96  bone]
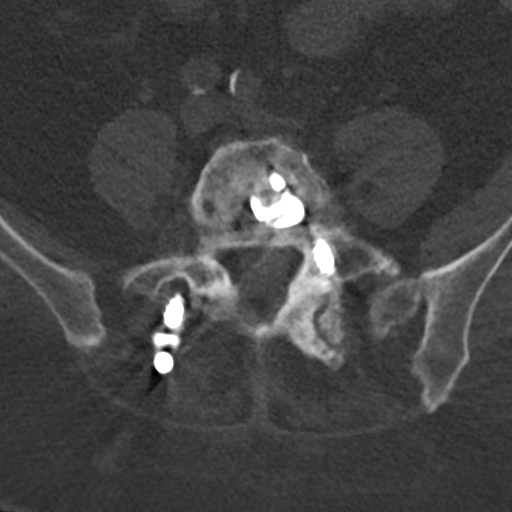
[im 44/96  bone]
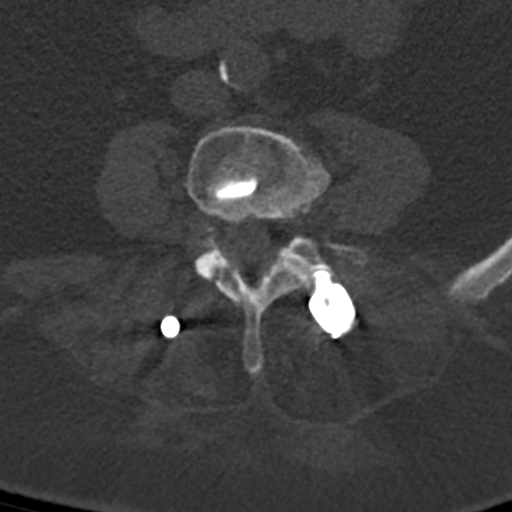
[im 59/96  bone]
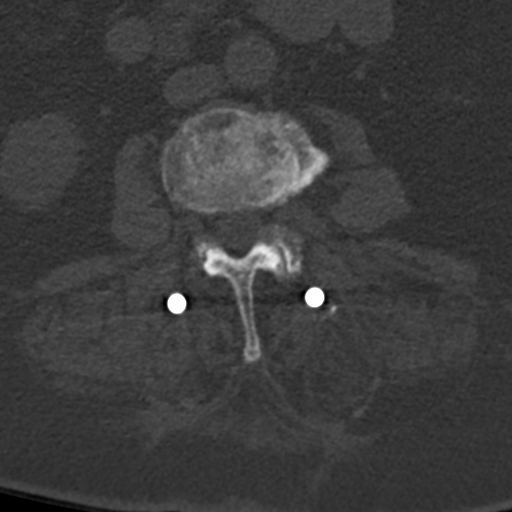
[im 74/96  soft-tissue]
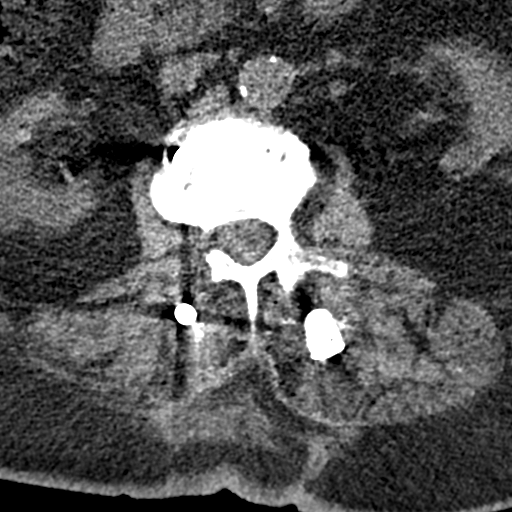
[im 74/96  bone]
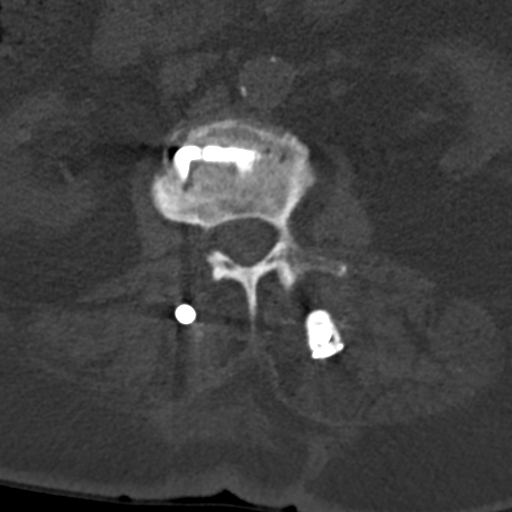
[im 88/96  bone]
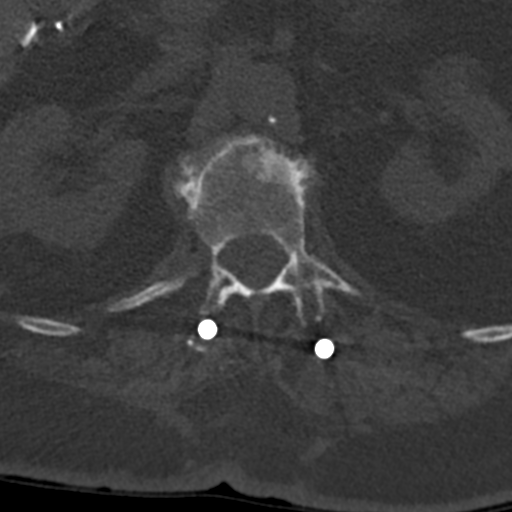

[Series 6: sagittal bone · sagittal · 0.29mm/px · 5 of 103 slices shown, 6 images]
[im 35/103  bone]
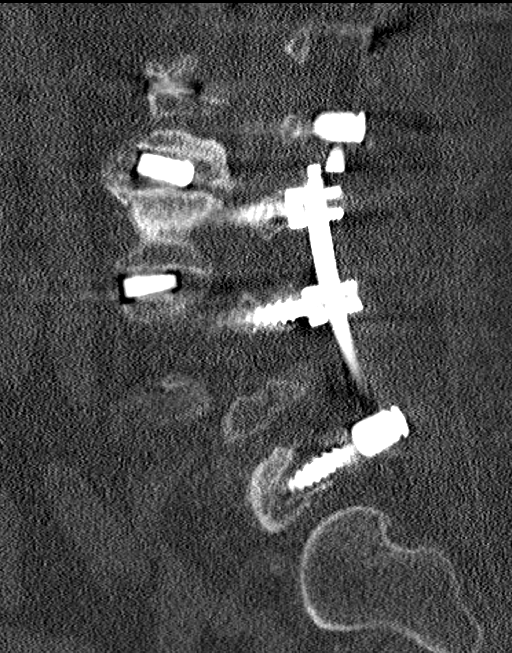
[im 43/103  bone]
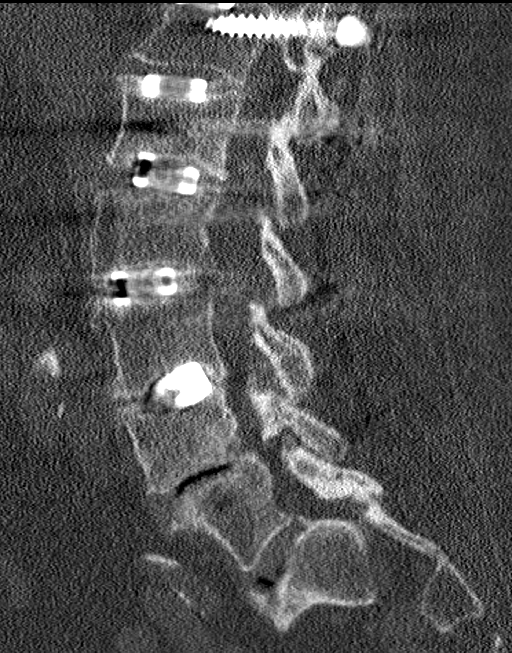
[im 52/103  soft-tissue]
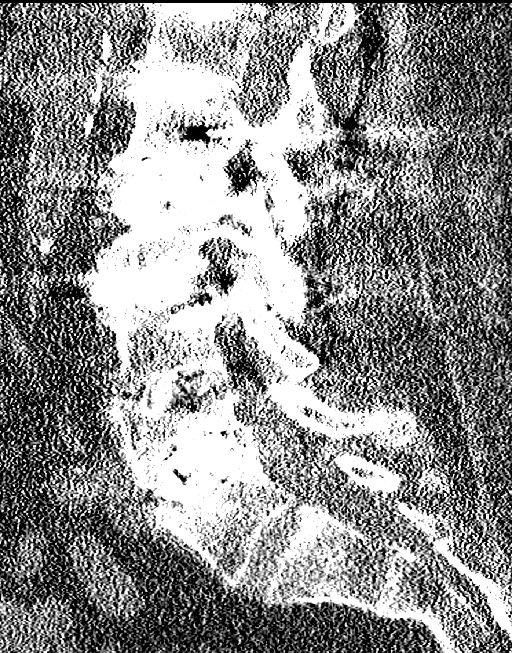
[im 52/103  bone]
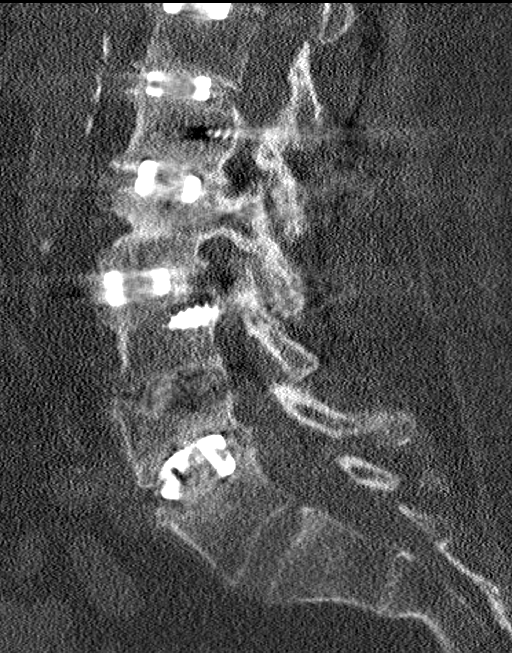
[im 60/103  bone]
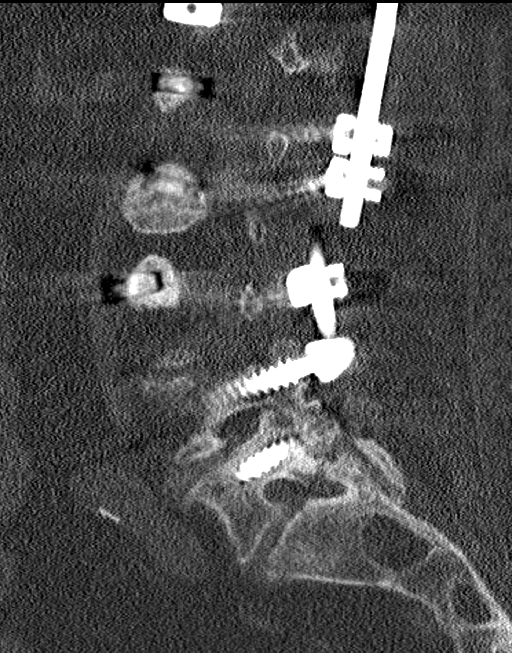
[im 69/103  bone]
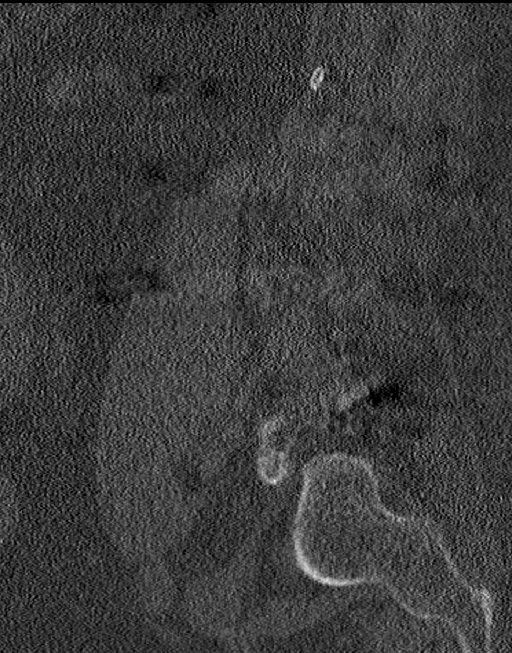

[Series 7: coronal bone · coronal · 0.37mm/px · 3 of 82 slices shown]
[im 17/82  bone]
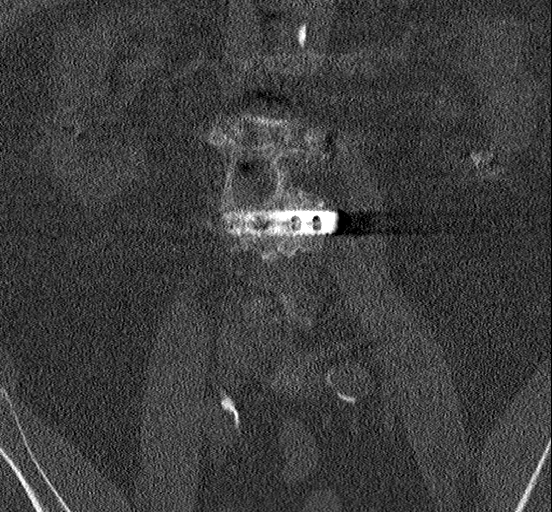
[im 33/82  bone]
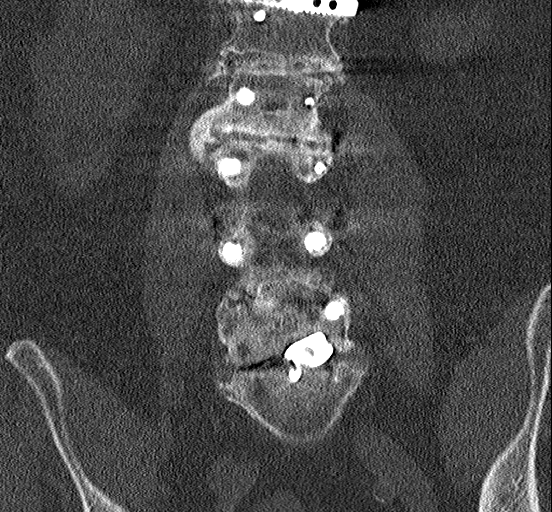
[im 49/82  bone]
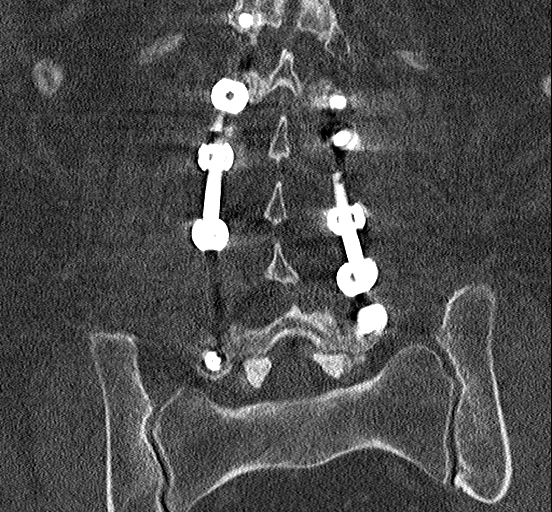

[14 of 33 positions shown; findings below may reference images not displayed]

FINDINGS: CT THORACIC SPINE FINDINGS

Alignment: Normal

Vertebrae: Posterior instrumented fusion extends from T10-L1 and
L2-L5 on the right and T10-L2 and L3-L5 on the left. There is no
abnormal lucency surrounding the hardware. There is no acute
fracture.

Paraspinal and other soft tissues: There is calcific aortic
atherosclerosis.

Disc levels: No thoracic spinal canal stenosis.

CT LUMBAR SPINE FINDINGS

Segmentation: Standard

Alignment: Normal

Vertebrae: Thoracolumbar spinal fusion as outlined above. No acute
fracture. There is lucency surrounding the right L5 screw.

Paraspinal and other soft tissues: No acute retroperitoneal
abnormality.

Disc levels: Spinal canal is obscured at multiple levels by streak
artifact from hardware. There is no visible canal stenosis. There is
mild bilateral foraminal stenosis at L4-5. Otherwise, the neural
foramina are patent.
IMPRESSION: 1. Thoracolumbar spinal fusion with lucency surrounding the right L5
screw, which may indicate loosening.
2. No acute fracture or static subluxation of the thoracic or lumbar
spine.
3. Mild bilateral L4-5 foraminal stenosis.
4. Aortic Atherosclerosis ([1S]-[1S]).

## 2020-04-08 DIAGNOSIS — L820 Inflamed seborrheic keratosis: Secondary | ICD-10-CM | POA: Diagnosis not present

## 2020-04-16 ENCOUNTER — Encounter: Payer: Self-pay | Admitting: Cardiology

## 2020-04-16 ENCOUNTER — Encounter: Payer: Self-pay | Admitting: *Deleted

## 2020-04-16 ENCOUNTER — Ambulatory Visit (INDEPENDENT_AMBULATORY_CARE_PROVIDER_SITE_OTHER): Payer: Medicare Other | Admitting: Cardiology

## 2020-04-16 VITALS — BP 138/74 | HR 70 | Ht 63.0 in | Wt 237.2 lb

## 2020-04-16 DIAGNOSIS — I471 Supraventricular tachycardia: Secondary | ICD-10-CM | POA: Diagnosis not present

## 2020-04-16 DIAGNOSIS — I340 Nonrheumatic mitral (valve) insufficiency: Secondary | ICD-10-CM

## 2020-04-16 NOTE — Progress Notes (Signed)
Clinical Summary Natasha [place] is a 74 y.o.female seen today for follow up of the following medical problems.   1. PSVT - no recent palpitations. She has done well with beta blocker without any significant recurrence.   - no recent palpitations  2. Chest pain/MSK pain - history of inermittentchest dull pain 2-3/10. At rest or with exertion, often with laying down. Can occur after meals. Lasts approx 15-20 minutes. No SOB or palpitations. Ongoing for 1 year, symptoms unchanged since last visit - no recent symptoms.   - seen in ER 03/29/17 with chest pain, primarily left scapular pain radiating into chest. From notes thought to be MSK pain.EKG and cardiac enzymes unremarkable. Discharged with meloxicam.  - no recent chest painsince last visit.   2018-01-10 ER visit with chest pain. Notes indicate worst with palpation. EKG nonacute, enzymes negative. Symptoms typical of her prior left chest/shoulder blade pain.  - followed up with Dr Carloyn Manner, had MRI showed bone spurs in shoulder blade - symptoms have resolved.   - no recent chest pains.     SH: Her husband Natasha [place] is also a patient of mine, he recently passed away 01-10-2017.Had been married 61 years. Has 2 daughters in the area. 3 grandkids.     Past Medical History:  Diagnosis Date  . Arthritis   . Bulging lumbar disc   . Colon polyps   . Diabetes mellitus without complication ([place])   . History of gout   . History of gout   . Hyperlipidemia   . Hyperparathyroidism, primary ([place])   . Hypertension   . Hypothyroidism   . Irritable bowel syndrome   . Nocturia   . Renal disorder    kidney stones  . Seasonal allergies   . SVT (supraventricular tachycardia) ([place])   . Thyroid disorder   . Urolithiasis      Allergies  Allergen Reactions  . Ace Inhibitors Other (See Comments)    Patient doesn't remember this allergy.  On 12/30/2014 received LOV visit from PCP , Dr Rory Percy in [place], [place].  - No known  active drug allergies listed in his office visit note dated 05/21/2014.    Marland Kitchen Venlafaxine Anxiety     Current Outpatient Medications  Medication Sig Dispense Refill  . acetaminophen (TYLENOL) 500 MG tablet Take 1,000 mg by mouth every 6 (six) hours as needed for moderate pain or headache.     . allopurinol (ZYLOPRIM) 300 MG tablet Take 300 mg by mouth daily.   5  . aspirin EC 81 MG tablet Take 81 mg by mouth daily.    Marland Kitchen atorvastatin (LIPITOR) 20 MG tablet Take 20 mg by mouth daily.    . Cholecalciferol (VITAMIN D3) 1000 units CAPS Take 1,000 Units by mouth daily.    Marland Kitchen docusate sodium (COLACE) 50 MG capsule Take 250 mg by mouth daily.     Marland Kitchen gabapentin (NEURONTIN) 300 MG capsule Take 300 mg by mouth at bedtime.    Marland Kitchen levothyroxine (SYNTHROID, LEVOTHROID) 50 MCG tablet Take 50 mcg by mouth daily before breakfast.     . losartan (COZAAR) 100 MG tablet Take 100 mg by mouth daily.   1  . metFORMIN (GLUCOPHAGE-XR) 500 MG 24 hr tablet Take 500 mg by mouth daily with breakfast.   5  . metoprolol succinate (TOPROL-XL) 50 MG 24 hr tablet Take 1 & 1/2 tablets daily. (Patient taking differently: Take 75 mg by mouth daily. ) 45 tablet 135  . oxyCODONE-acetaminophen (  PERCOCET/ROXICET) 5-325 MG tablet Take 1 tablet by mouth 2 (two) times a day.     . Potassium Citrate 15 MEQ (1620 MG) TBCR Take 1 tablet by mouth 3 (three) times daily. 120 tablet 11  . tizanidine (ZANAFLEX) 2 MG capsule Take 2 mg by mouth 3 (three) times daily as needed for muscle spasms.     Marland Kitchen zinc gluconate 50 MG tablet Take 50 mg by mouth daily.     No current facility-administered medications for this visit.   Facility-Administered Medications Ordered in Other Visits  Medication Dose Route Frequency Provider Last Rate Last Admin  . acetaminophen (TYLENOL) tablet 650 mg  650 mg Oral Q4H PRN Irine Seal, MD         Past Surgical History:  Procedure Laterality Date  . ABDOMINAL HYSTERECTOMY  1986  . APPENDECTOMY  1954  . BACK SURGERY   09/2016  . bladder tack  09/1984  . CHOLECYSTECTOMY  01/1999  . COLONOSCOPY  06/2006   This was her second one  . COLONOSCOPY  07/12/2011   Procedure: COLONOSCOPY;  Surgeon: Rogene Houston, MD;  [facility];  Service: Endoscopy;  Laterality: N/A;  10:30 am  . COLONOSCOPY N/A 04/03/2018   Procedure: COLONOSCOPY;  Surgeon: Rogene Houston, MD;  [facility];  Service: Endoscopy;  Laterality: N/A;  830  . CYSTOSCOPY W/ URETERAL STENT PLACEMENT Left 10/03/2014   Procedure: CYSTOSCOPY WITH URETERAL STENT PLACEMENT;  Surgeon: Malka So, MD;  [facility];  Service: Urology;  Laterality: Left;  . CYSTOSCOPY WITH URETEROSCOPY AND STENT PLACEMENT Left 03/16/2015   Procedure: CYSTOSCOPY WITH LEFT STENT PLACEMENT;  Surgeon: Franchot Gallo, MD;  [facility];  Service: Urology;  Laterality: Left;  . gout removal  2017  . HOLMIUM LASER APPLICATION Left 09/18/7508   Procedure: HOLMIUM LASER APPLICATION;  Surgeon: Franchot Gallo, MD;  [facility];  Service: Urology;  Laterality: Left;  . KNEE ARTHROSCOPY  12/2010   Left knee  . LITHOTRIPSY  06/1995  . LUMBAR FUSION  06/2016  . NEPHROLITHOTOMY Left 01/07/2015   Procedure: NEPHROLITHOTOMY PERCUTANEOUS;  Surgeon: Franchot Gallo, MD;  [facility];  Service: Urology;  Laterality: Left;  With STENT  . POLYPECTOMY  04/03/2018   Procedure: POLYPECTOMY;  Surgeon: Rogene Houston, MD;  [facility];  Service: Endoscopy;;  transverse,splenic flexure, sigmoid  . Rotor Cuff  2011   Right Shoulder  . STENT REMOVAL Left 03/16/2015   Procedure: LEFT URETERAL STENT REMOVAL;  Surgeon: Franchot Gallo, MD;  [facility];  Service: Urology;  Laterality: Left;  . THYROID EXPLORATION N/A 07/31/2013   Procedure: neck EXPLORATION;  Surgeon: Odis Hollingshead, MD;  [facility];  Service: General;  Laterality: N/A;  . THYROIDECTOMY N/A 07/31/2013   Procedure: PARATHYROIDECTOMY;  Surgeon: Odis Hollingshead,  MD;  [facility];  Service: General;  Laterality: N/A;  . TUMOR REMOVAL  06/1997   Beign; on back x2 surgeries  . TUMOR REMOVAL  2002   "FATTY TUMORS" REMOVED FROM BACK  . URETEROSCOPY WITH HOLMIUM LASER LITHOTRIPSY Left 03/16/2015   Procedure: LEFT URETEROSCOPIC LASER LITHOTRIPSY WITH STONE EXTRACTION;  Surgeon: Franchot Gallo, MD;  [facility];  Service: Urology;  Laterality: Left;     Allergies  Allergen Reactions  . Ace Inhibitors Other (See Comments)    Patient doesn't remember this allergy.  On 12/30/2014 received LOV visit from PCP , Dr Rory Percy in [place], [place].  -  No known active drug allergies listed in his office visit note dated 05/21/2014.    Marland Kitchen Venlafaxine Anxiety      Family History  Problem Relation Age of Onset  . Hypertension Mother   . Arthritis Mother   . Hypertension Father   . Healthy Brother   . Healthy Daughter   . Healthy Daughter   . Colon cancer Neg Hx      Social History Ms. Mallinger reports that she has never smoked. She has never used smokeless tobacco. Ms. Natal reports current alcohol use.   Review of Systems CONSTITUTIONAL: No weight loss, fever, chills, weakness or fatigue.  HEENT: Eyes: No visual loss, blurred vision, double vision or yellow sclerae.No hearing loss, sneezing, congestion, runny nose or sore throat.  SKIN: No rash or itching.  CARDIOVASCULAR: per hpi RESPIRATORY: No shortness of breath, cough or sputum.  GASTROINTESTINAL: No anorexia, nausea, vomiting or diarrhea. No abdominal pain or blood.  GENITOURINARY: No burning on urination, no polyuria NEUROLOGICAL: No headache, dizziness, syncope, paralysis, ataxia, numbness or tingling in the extremities. No change in bowel or bladder control.  MUSCULOSKELETAL: No muscle, back pain, joint pain or stiffness.  LYMPHATICS: No enlarged nodes. No history of splenectomy.  PSYCHIATRIC: No history of depression or anxiety.  ENDOCRINOLOGIC: No reports of sweating, cold or heat  intolerance. No polyuria or polydipsia.  Marland Kitchen   Physical Examination Vitals:   04/16/20 1439  BP: (!) 138/74  Pulse: 70  SpO2: 94%   Filed Weights   04/16/20 1439  Weight: (!) 237 lb 3.2 oz (107.6 kg)    Gen: resting comfortably, no acute distress HEENT: no scleral icterus, pupils equal round and reactive, no palptable cervical adenopathy,  CV: RRR, 2/6 systolic murmur apex, no jvd Resp: Clear to auscultation bilaterally GI: abdomen is soft, non-tender, non-distended, normal bowel sounds, no hepatosplenomegaly MSK: extremities are warm, no edema.  Skin: warm, no rash Neuro:  no focal deficits Psych: appropriate affect   Diagnostic Studies  Jan 2017 echo Study Conclusions  - Left ventricle: The cavity size was normal. Wall thickness was increased in a pattern of mild LVH. Systolic function was normal. The estimated ejection fraction was in the range of 60% to 65%. Wall motion was normal; there were no regional wall motion abnormalities. Doppler parameters are consistent with abnormal left ventricular relaxation (grade 1 diastolic dysfunction). - Aortic valve: Mildly calcified annulus. - Mitral valve: Calcified annulus. There was mild regurgitation. - Tricuspid valve: There was mild regurgitation.   Assessment and Plan   1. PSVT - no recurrences, has done very well on beta blocker - continue current meds - EKG today shows NSR.   2. Heart murmur - pcp has order a repeat echo, had mild MR in 2017. F/u results      Arnoldo Lenis, M.D.

## 2020-04-16 NOTE — Patient Instructions (Signed)

## 2020-04-23 DIAGNOSIS — I517 Cardiomegaly: Secondary | ICD-10-CM | POA: Diagnosis not present

## 2020-04-23 DIAGNOSIS — I348 Other nonrheumatic mitral valve disorders: Secondary | ICD-10-CM | POA: Diagnosis not present

## 2020-04-23 DIAGNOSIS — I272 Pulmonary hypertension, unspecified: Secondary | ICD-10-CM | POA: Diagnosis not present

## 2020-04-23 DIAGNOSIS — I350 Nonrheumatic aortic (valve) stenosis: Secondary | ICD-10-CM | POA: Diagnosis not present

## 2020-04-23 DIAGNOSIS — R011 Cardiac murmur, unspecified: Secondary | ICD-10-CM | POA: Diagnosis not present

## 2020-04-26 DIAGNOSIS — M545 Low back pain: Secondary | ICD-10-CM | POA: Diagnosis not present

## 2020-04-26 DIAGNOSIS — Z6841 Body Mass Index (BMI) 40.0 and over, adult: Secondary | ICD-10-CM | POA: Diagnosis not present

## 2020-04-26 DIAGNOSIS — M5416 Radiculopathy, lumbar region: Secondary | ICD-10-CM | POA: Diagnosis not present

## 2020-04-26 DIAGNOSIS — M47816 Spondylosis without myelopathy or radiculopathy, lumbar region: Secondary | ICD-10-CM | POA: Diagnosis not present

## 2020-04-26 DIAGNOSIS — M438X9 Other specified deforming dorsopathies, site unspecified: Secondary | ICD-10-CM | POA: Diagnosis not present

## 2020-04-26 DIAGNOSIS — M4126 Other idiopathic scoliosis, lumbar region: Secondary | ICD-10-CM | POA: Diagnosis not present

## 2020-04-26 DIAGNOSIS — M961 Postlaminectomy syndrome, not elsewhere classified: Secondary | ICD-10-CM | POA: Diagnosis not present

## 2020-04-26 DIAGNOSIS — I10 Essential (primary) hypertension: Secondary | ICD-10-CM | POA: Diagnosis not present

## 2020-04-27 ENCOUNTER — Telehealth: Payer: Self-pay | Admitting: Cardiology

## 2020-04-27 DIAGNOSIS — I340 Nonrheumatic mitral (valve) insufficiency: Secondary | ICD-10-CM

## 2020-04-27 NOTE — Telephone Encounter (Signed)
Patient called stating she had an Echo done at [facility]- the results are in [place] and she's wanting Dr. Harl Bowie to review the results from the test, her PCP ordered the Echo.

## 2020-05-03 NOTE — Telephone Encounter (Signed)
Pt voiced understanding - orders placed for echo in 1 year and will forward to scheduling

## 2020-05-03 NOTE — Telephone Encounter (Signed)
Echo shows one of her heart valves (aortic valve) is mild to moderately stiff. This is a minor finding but explains her heart murmur. Just something to be monitored for now once a year with repeat echos   J Eulia Hatcher MD

## 2020-05-05 ENCOUNTER — Ambulatory Visit (HOSPITAL_COMMUNITY)
Admission: RE | Admit: 2020-05-05 | Discharge: 2020-05-05 | Disposition: A | Discharge status: Home / Self Care | Payer: Medicare Other | Source: Ambulatory Visit | Attending: Urology | Admitting: Urology

## 2020-05-05 ENCOUNTER — Other Ambulatory Visit: Payer: Self-pay

## 2020-05-05 DIAGNOSIS — N200 Calculus of kidney: Secondary | ICD-10-CM | POA: Insufficient documentation

## 2020-05-05 IMAGING — DX DG ABDOMEN 1V
2 series · 2 of 2 positions shown · non-contrast
Comparison: [DATE]

CLINICAL DATA: Nephrolithiasis

EXAM:
ABDOMEN - 1 VIEW

[abdomen kub (1 of 2)]
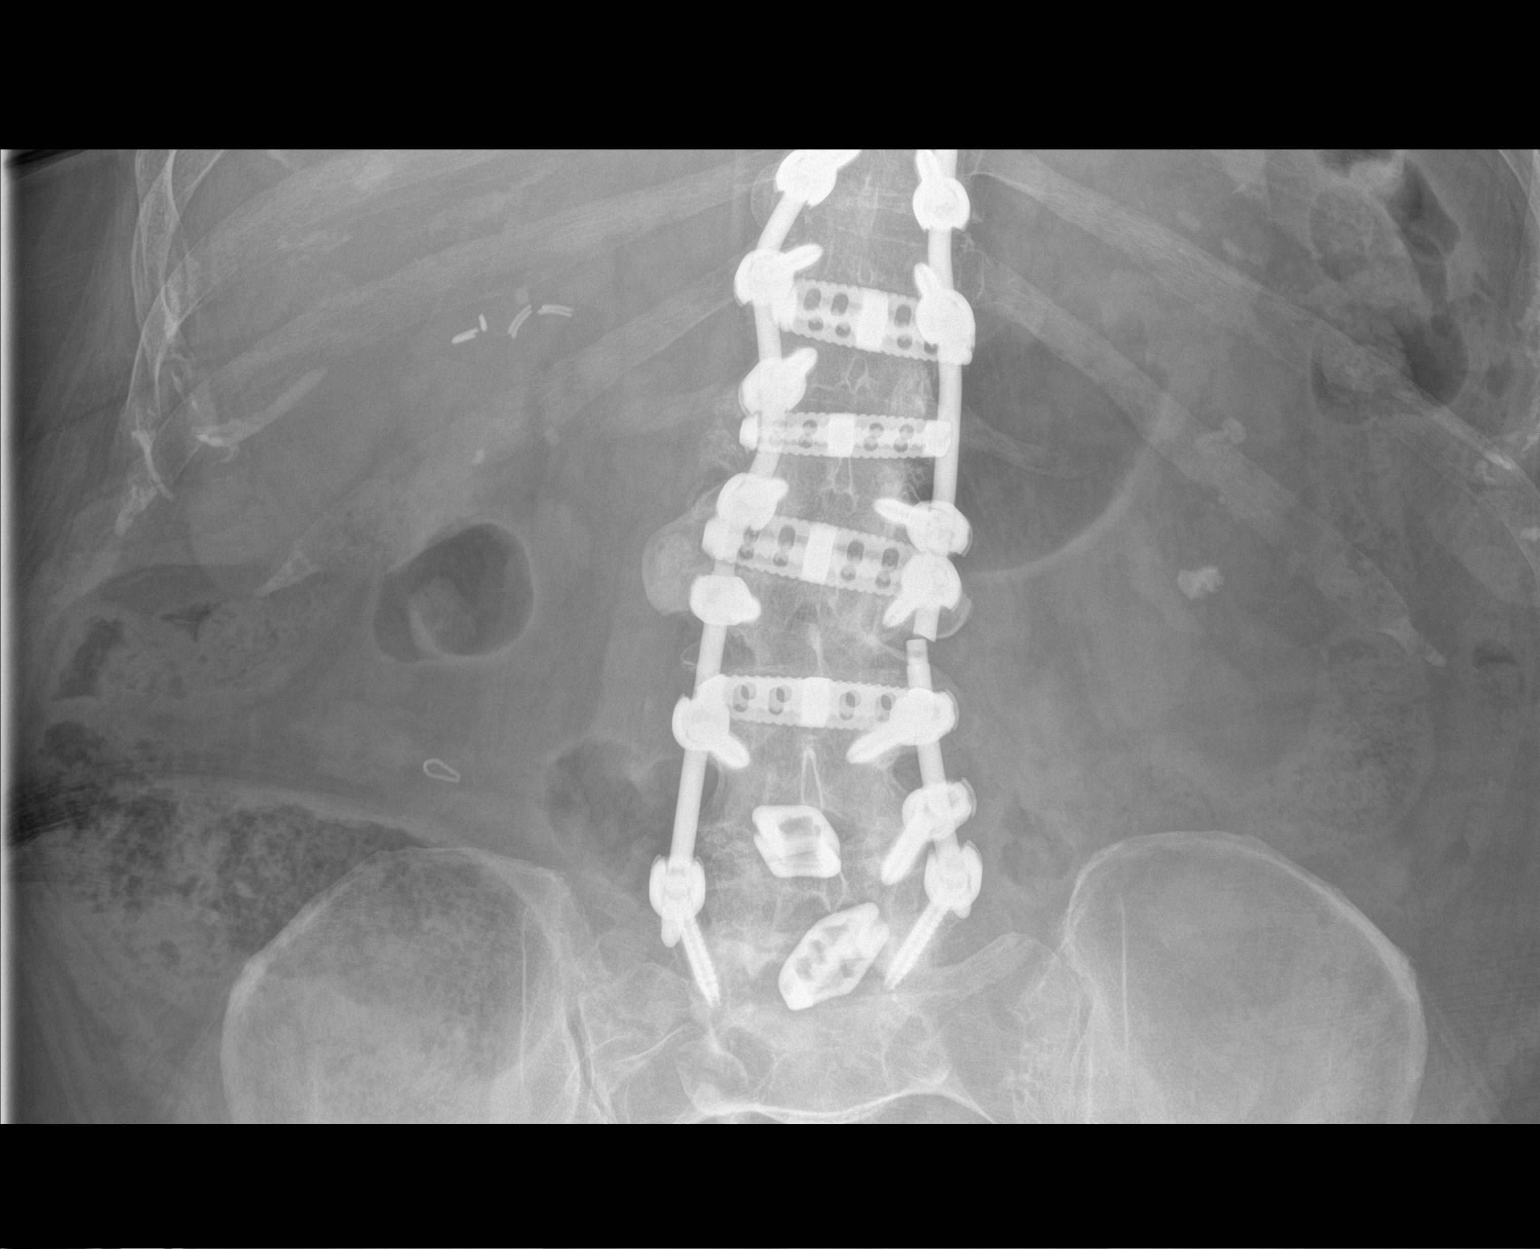

[abdomen kub (2 of 2)]
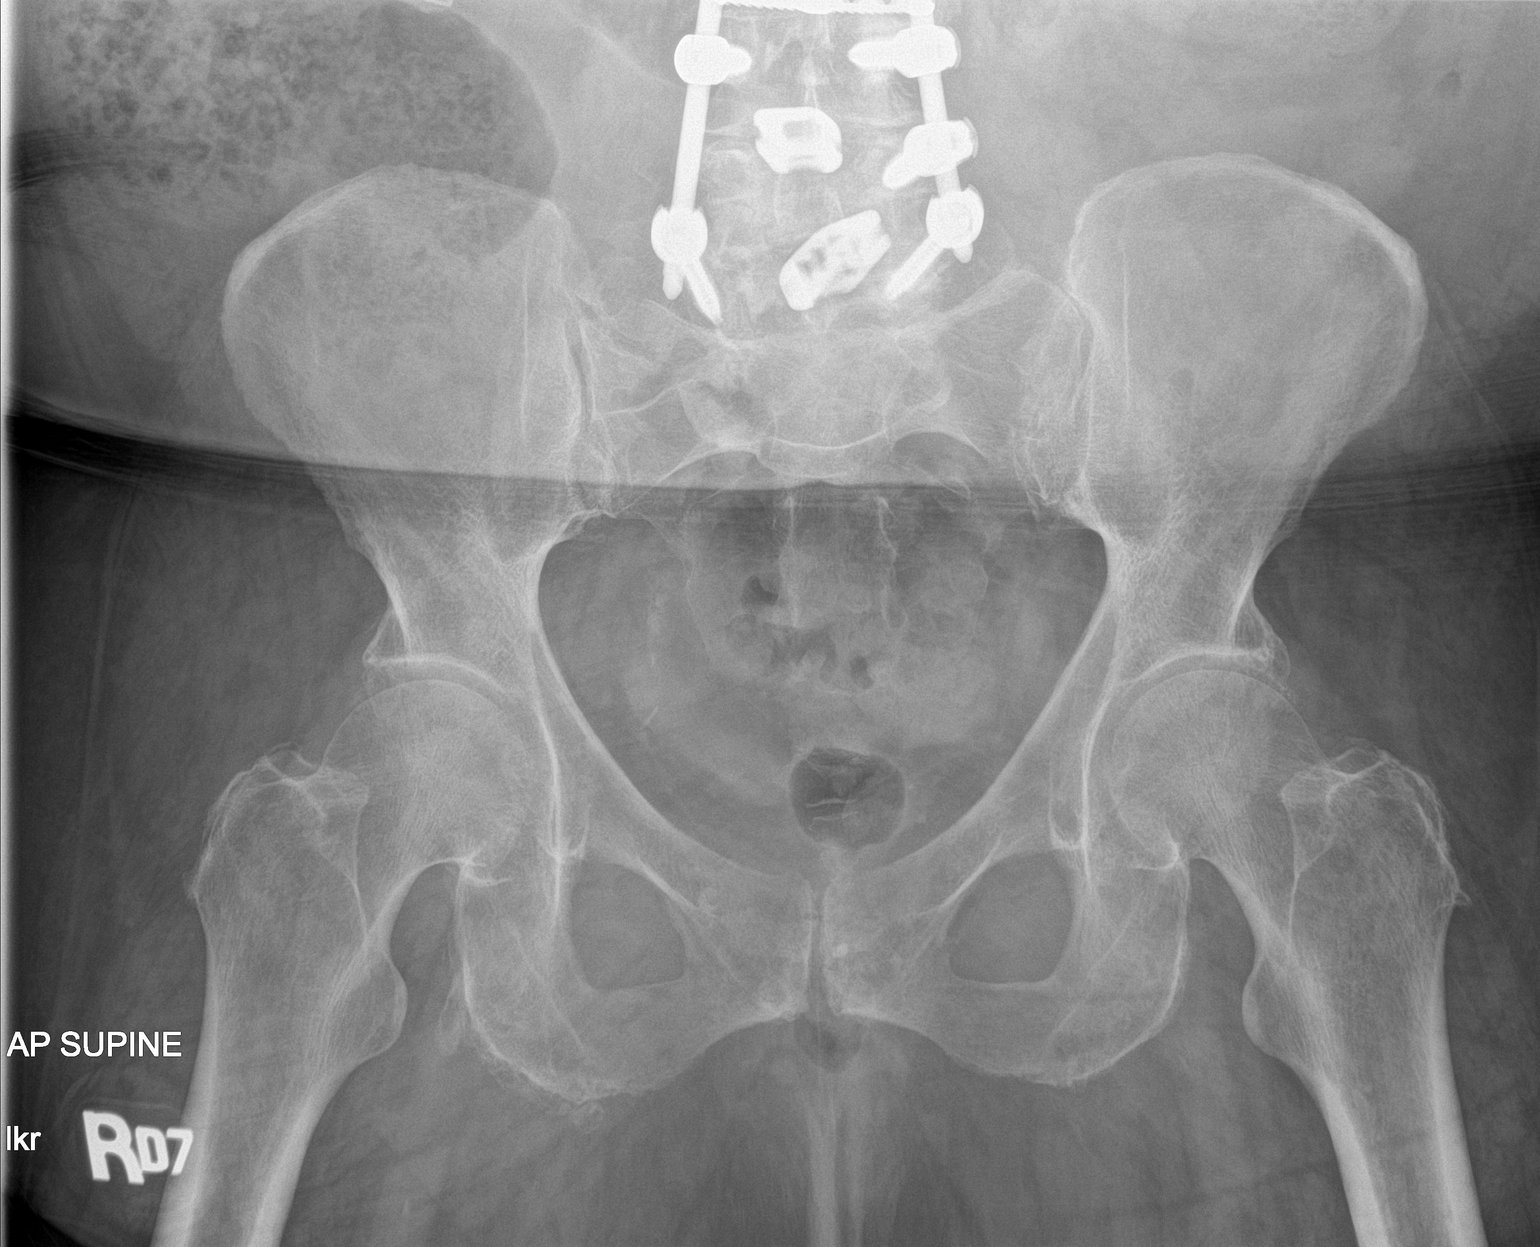

[2 of 2 positions shown; findings below may reference images not displayed]

FINDINGS: Stable appearance of multiple bilateral renal calculi, largest
projecting over the inferior left renal shadow measuring
approximately 9 mm. No definite ureteral or bladder calculus. Bowel
gas pattern is normal. Unchanged appearance of extensive
thoracolumbar fixation hardware including discontinuity of the
bilateral fixation rods.
IMPRESSION: Stable appearance of multiple bilateral renal calculi, largest
projecting over the inferior left renal shadow measuring
approximately 9 mm.

## 2020-05-10 DIAGNOSIS — M545 Low back pain: Secondary | ICD-10-CM | POA: Diagnosis not present

## 2020-05-10 DIAGNOSIS — M961 Postlaminectomy syndrome, not elsewhere classified: Secondary | ICD-10-CM | POA: Diagnosis not present

## 2020-05-11 ENCOUNTER — Encounter: Payer: Self-pay | Admitting: Urology

## 2020-05-11 ENCOUNTER — Ambulatory Visit (INDEPENDENT_AMBULATORY_CARE_PROVIDER_SITE_OTHER): Payer: Medicare Other | Admitting: Urology

## 2020-05-11 ENCOUNTER — Other Ambulatory Visit: Payer: Self-pay

## 2020-05-11 VITALS — BP 152/86 | HR 80 | Temp 97.9°F | Ht 63.0 in | Wt 237.2 lb

## 2020-05-11 DIAGNOSIS — N200 Calculus of kidney: Secondary | ICD-10-CM | POA: Diagnosis not present

## 2020-05-11 LAB — MICROSCOPIC EXAMINATION
Epithelial Cells (non renal): >10 /hpf — AB (ref 0–10)
RBC, Urine: NONE SEEN /hpf (ref 0–2)
Renal Epithel, UA: NONE SEEN /hpf

## 2020-05-11 LAB — URINALYSIS, ROUTINE W REFLEX MICROSCOPIC
Bilirubin, UA: NEGATIVE
Glucose, UA: NEGATIVE
Ketones, UA: NEGATIVE
Nitrite, UA: NEGATIVE
Protein,UA: NEGATIVE
RBC, UA: NEGATIVE
Specific Gravity, UA: 1.02 (ref 1.005–1.030)
Urobilinogen, Ur: 0.2 mg/dL (ref 0.2–1.0)
pH, UA: 7 (ref 5.0–7.5)

## 2020-05-11 NOTE — Progress Notes (Signed)
H&P  Chief Complaint: Kidney  Stones  History of Present Illness: Natasha Warren is a 74 y.o. year old female  8.24.2021: Pt reports that she has not recently passed any kidney stones and submits no urinary symptoms at this time. Pt continuing on allopurinol and potassium citrate 15 mEq (TID)   Left PCNL performed on 4.21.2016. There were a few fragments left due to difficult anatomy.   1.2019: Recent CT scan of abdomen and pelvis from 06/2017 reviewed. Scarring about the left kidney. Mild renal atrophy bilaterally. 2 calculi in the right kidney, the largest approximately 4-5 mm. 2 dominant calculi in the left kidney, the largest of which is slightly branched in the lower pole measuring 11 x 11 mm. Interpolar stone measuring 10 mm on the left. No evidence of ureterolithiasis. No obstruction.   7.9.2019: recent KUB revealed stable left renal calculi (12 and 11 mm) as well as 3 separate 4 mm right renal calculi. She usually takes the Kcit as prescribed but the 15 mEq dose is expensive.   1.21.2020:  Recent KUB revealed stable stone size/location. She is still on potassium citrate.   7.28.2020: Here today for follow-up. She reports that she has diverticulitis and that it is hard to distinguish that pain from her KS pain -- though this usually resolves with Tylenol and a day of rest. Recent KUB still shows stable stone size and location. She has been on potassium citrate 4 times a day but expresses that she would like to return to 3 times day. She denies any urinary sx's since last visit. 11 and 12 mm stones in left kidney and 3 small stones in right.   2.23.2021: Here today for follow-up. She continues on 15 mEq potassium citrate 3x daily. She denies any adverse effects and this is not very cost prohibitive for her. She continues to have diverticulosis pain that is difficult for her to distinguish from the beginning of a kidney stone passing -- she denies any true stone incidents in the  interval. Most recent KUB shows bilateral stones are stable in size/position.  Past Medical History:  Diagnosis Date  . Arthritis   . Bulging lumbar disc   . Colon polyps   . Diabetes mellitus without complication ([place])   . History of gout   . History of gout   . Hyperlipidemia   . Hyperparathyroidism, primary ([place])   . Hypertension   . Hypothyroidism   . Irritable bowel syndrome   . Nocturia   . Renal disorder    kidney stones  . Seasonal allergies   . SVT (supraventricular tachycardia) ([place])   . Thyroid disorder   . Urolithiasis     Past Surgical History:  Procedure Laterality Date  . ABDOMINAL HYSTERECTOMY  1986  . APPENDECTOMY  1954  . BACK SURGERY  09/2016  . bladder tack  09/1984  . CHOLECYSTECTOMY  01/1999  . COLONOSCOPY  06/2006   This was her second one  . COLONOSCOPY  07/12/2011   Procedure: COLONOSCOPY;  Surgeon: Rogene Houston, MD;  [facility];  Service: Endoscopy;  Laterality: N/A;  10:30 am  . COLONOSCOPY N/A 04/03/2018   Procedure: COLONOSCOPY;  Surgeon: Rogene Houston, MD;  [facility];  Service: Endoscopy;  Laterality: N/A;  830  . CYSTOSCOPY W/ URETERAL STENT PLACEMENT Left 10/03/2014   Procedure: CYSTOSCOPY WITH URETERAL STENT PLACEMENT;  Surgeon: Malka So, MD;  [facility];  Service: Urology;  Laterality: Left;  . CYSTOSCOPY  WITH URETEROSCOPY AND STENT PLACEMENT Left 03/16/2015   Procedure: CYSTOSCOPY WITH LEFT STENT PLACEMENT;  Surgeon: Franchot Gallo, MD;  [facility]: [facility];  Service: Urology;  Laterality: Left;  . gout removal  2017  . HOLMIUM LASER APPLICATION Left 2/58/5277   Procedure: HOLMIUM LASER APPLICATION;  Surgeon: Franchot Gallo, MD;  [facility];  Service: Urology;  Laterality: Left;  . KNEE ARTHROSCOPY  12/2010   Left knee  . LITHOTRIPSY  06/1995  . LUMBAR FUSION  06/2016  . NEPHROLITHOTOMY Left 01/07/2015   Procedure: NEPHROLITHOTOMY PERCUTANEOUS;  Surgeon: Franchot Gallo, MD;   [facility];  Service: Urology;  Laterality: Left;  With STENT  . POLYPECTOMY  04/03/2018   Procedure: POLYPECTOMY;  Surgeon: Rogene Houston, MD;  [facility];  Service: Endoscopy;;  transverse,splenic flexure, sigmoid  . Rotor Cuff  2011   Right Shoulder  . STENT REMOVAL Left 03/16/2015   Procedure: LEFT URETERAL STENT REMOVAL;  Surgeon: Franchot Gallo, MD;  [facility];  Service: Urology;  Laterality: Left;  . THYROID EXPLORATION N/A 07/31/2013   Procedure: neck EXPLORATION;  Surgeon: Odis Hollingshead, MD;  [facility];  Service: General;  Laterality: N/A;  . THYROIDECTOMY N/A 07/31/2013   Procedure: PARATHYROIDECTOMY;  Surgeon: Odis Hollingshead, MD;  [facility];  Service: General;  Laterality: N/A;  . TUMOR REMOVAL  06/1997   Beign; on back x2 surgeries  . TUMOR REMOVAL  2002   "FATTY TUMORS" REMOVED FROM BACK  . URETEROSCOPY WITH HOLMIUM LASER LITHOTRIPSY Left 03/16/2015   Procedure: LEFT URETEROSCOPIC LASER LITHOTRIPSY WITH STONE EXTRACTION;  Surgeon: Franchot Gallo, MD;  [facility];  Service: Urology;  Laterality: Left;    Home Medications:  (Not in a hospital admission)   Allergies:  Allergies  Allergen Reactions  . Ace Inhibitors Other (See Comments)    Patient doesn't remember this allergy.  On 12/30/2014 received LOV visit from PCP , Dr Rory Percy in [place], [place].  - No known active drug allergies listed in his office visit note dated 05/21/2014.    Marland Kitchen Venlafaxine Anxiety    Family History  Problem Relation Age of Onset  . Hypertension Mother   . Arthritis Mother   . Hypertension Father   . Healthy Brother   . Healthy Daughter   . Healthy Daughter   . Colon cancer Neg Hx     Social History:  reports that she has never smoked. She has never used smokeless tobacco. She reports current alcohol use. She reports that she does not use drugs.  ROS: A complete review of systems was performed.  All systems are negative except for  pertinent findings as noted.  Physical Exam:  Vital signs in last 24 hours: Temp:  [97.9 F (36.6 C)] 97.9 F (36.6 C) (08/24 1103) Pulse Rate:  [80] 80 (08/24 1103) BP: (152)/(86) 152/86 (08/24 1103) Weight:  [237 lb 3.2 oz (107.6 kg)] 237 lb 3.2 oz (107.6 kg) (08/24 1103) General:  Alert and oriented, No acute distress, Obese HEENT: Normocephalic, atraumatic Neck: No JVD or lymphadenopathy Cardiovascular: Regular rate  Lungs: Normal inspiratory/expiratory excursion Back: No CVA tenderness  Extremities: No edema Neurologic: Grossly intact  I have reviewed prior pt notes  I have reviewed notes from referring/previous physicians  I have reviewed urinalysis results  I have independently reviewed prior imaging--there are stable calcifications in each kidney.  I did look at prior film from February, 2021.  Largest stone is in lower pole of left  kidney, measuring just over 11 mm.   Impression/Assessment:  Stable bilateral renal calculi, on medical therapy including allopurinol and potassium citrate  Plan:  1. Pt continued on allopurinol and potassium citrate.  2. F/U in 6 months for OV and KUB.  Budd Palmer 05/11/2020, 11:08 AM  [name]. Demontae Antunes MD

## 2020-05-11 NOTE — Progress Notes (Signed)
Urological Symptom Review  Patient is experiencing the following symptoms: Get up at night to urinate Leakage of urine  Kidney stones   Review of Systems  Gastrointestinal (upper)  : Negative for upper GI symptoms  Gastrointestinal (lower) : Constipation  Constitutional : Negative for symptoms  Skin: Negative for skin symptoms  Eyes: Negative for eye symptoms  Ear/Nose/Throat : Negative for Ear/Nose/Throat symptoms  Hematologic/Lymphatic: Negative for Hematologic/Lymphatic symptoms  Cardiovascular : Negative for cardiovascular symptoms  Respiratory : Negative for respiratory symptoms  Endocrine: Negative for endocrine symptoms  Musculoskeletal: Back pain Joint pain  Neurological: Negative for neurological symptoms  Psychologic: Negative for psychiatric symptoms

## 2020-08-05 DIAGNOSIS — M961 Postlaminectomy syndrome, not elsewhere classified: Secondary | ICD-10-CM | POA: Diagnosis not present

## 2020-08-28 DIAGNOSIS — Z23 Encounter for immunization: Secondary | ICD-10-CM | POA: Diagnosis not present

## 2020-08-31 DIAGNOSIS — J309 Allergic rhinitis, unspecified: Secondary | ICD-10-CM | POA: Diagnosis not present

## 2020-10-27 DIAGNOSIS — M4126 Other idiopathic scoliosis, lumbar region: Secondary | ICD-10-CM | POA: Diagnosis not present

## 2020-10-27 DIAGNOSIS — M438X9 Other specified deforming dorsopathies, site unspecified: Secondary | ICD-10-CM | POA: Diagnosis not present

## 2020-10-27 DIAGNOSIS — Z6841 Body Mass Index (BMI) 40.0 and over, adult: Secondary | ICD-10-CM | POA: Diagnosis not present

## 2020-10-27 DIAGNOSIS — M961 Postlaminectomy syndrome, not elsewhere classified: Secondary | ICD-10-CM | POA: Diagnosis not present

## 2020-10-27 DIAGNOSIS — M5416 Radiculopathy, lumbar region: Secondary | ICD-10-CM | POA: Diagnosis not present

## 2020-10-27 DIAGNOSIS — M47816 Spondylosis without myelopathy or radiculopathy, lumbar region: Secondary | ICD-10-CM | POA: Diagnosis not present

## 2020-10-27 DIAGNOSIS — I10 Essential (primary) hypertension: Secondary | ICD-10-CM | POA: Diagnosis not present

## 2020-11-09 ENCOUNTER — Ambulatory Visit: Payer: Medicare Other | Admitting: Urology

## 2020-11-10 DIAGNOSIS — M47816 Spondylosis without myelopathy or radiculopathy, lumbar region: Secondary | ICD-10-CM | POA: Diagnosis not present

## 2020-11-10 DIAGNOSIS — M5416 Radiculopathy, lumbar region: Secondary | ICD-10-CM | POA: Diagnosis not present

## 2020-11-19 ENCOUNTER — Other Ambulatory Visit: Payer: Self-pay

## 2020-11-19 ENCOUNTER — Ambulatory Visit (HOSPITAL_COMMUNITY)
Admission: RE | Admit: 2020-11-19 | Discharge: 2020-11-19 | Disposition: A | Discharge status: Home / Self Care | Payer: Medicare Other | Source: Ambulatory Visit | Attending: Urology | Admitting: Urology

## 2020-11-19 DIAGNOSIS — N289 Disorder of kidney and ureter, unspecified: Secondary | ICD-10-CM | POA: Diagnosis not present

## 2020-11-19 DIAGNOSIS — N200 Calculus of kidney: Secondary | ICD-10-CM | POA: Diagnosis not present

## 2020-11-19 IMAGING — DX DG ABDOMEN 1V
2 series · 2 of 2 positions shown · non-contrast
Comparison: [DATE]

CLINICAL DATA: Nephrolithiasis

EXAM:
ABDOMEN - 1 VIEW

[abdomen kub (1 of 2)]
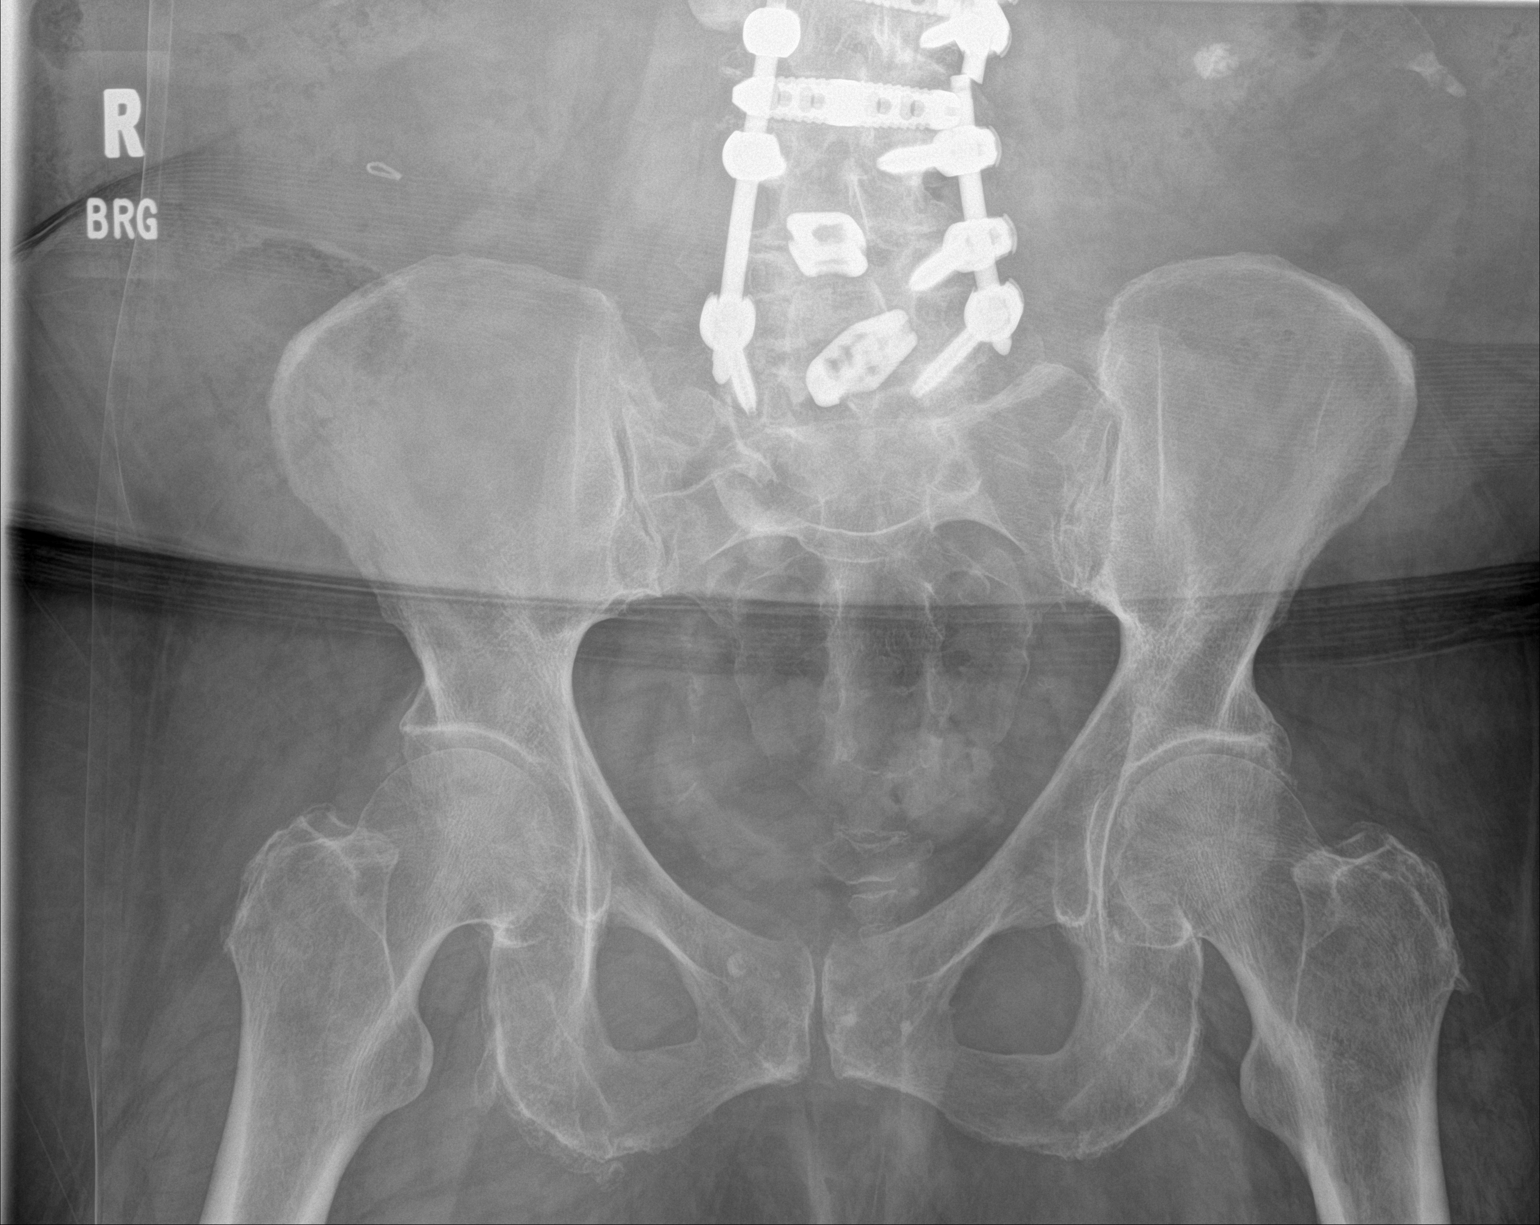

[abdomen kub (2 of 2)]
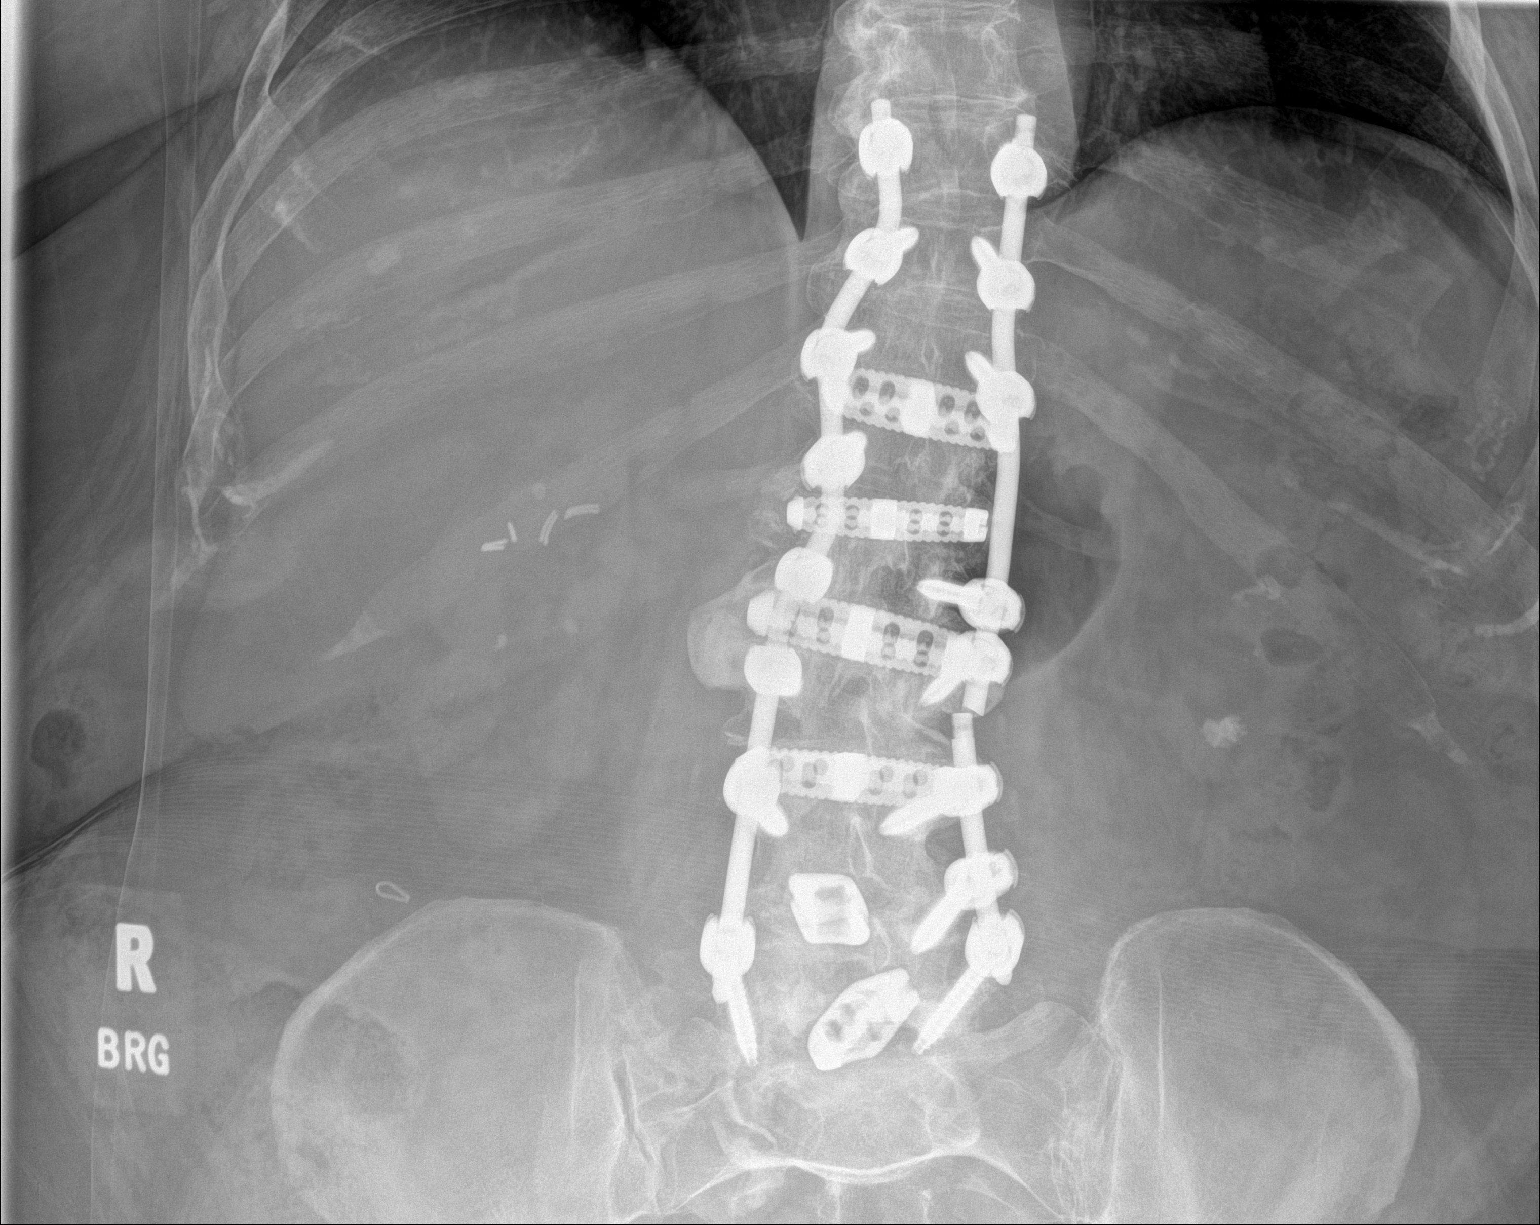

[2 of 2 positions shown; findings below may reference images not displayed]

FINDINGS: Revisualization of multiple radiopaque foci projecting over the
contours of the LEFT kidney. Largest measures 10 mm and projects
over the inferior pole of the LEFT kidney. There are several similar
size radiopaque foci which project over the contours of the RIGHT
kidney, largest measuring approximately 4 mm. Overall kidney stone
burden is similar in comparison to prior.

Surgical clips project over the RIGHT upper quadrant. Status post
posterior fixation of the thoracolumbar spine. Osteopenia.
Nonobstructive bowel gas pattern.
IMPRESSION: 1. Bilateral nephrolithiasis, similar in comparison to prior.

## 2020-11-22 NOTE — Progress Notes (Signed)
History of Present Illness: Natasha Warren is a 75 y.o. year old female followed here for urolithiasis.  She has had several stone procedures.  She is on potassium citrate 3 times a day.  She says she usually takes it a couple of times a day.  She has had no stone issues over the past year or 2.  She has had no gross hematuria or dysuria.  She thinks her urine looks a little dark.  Recent KUB reveals stable stone burden bilaterally.  Past Medical History:  Diagnosis Date  . Arthritis   . Bulging lumbar disc   . Colon polyps   . Diabetes mellitus without complication ([place])   . History of gout   . History of gout   . Hyperlipidemia   . Hyperparathyroidism, primary ([place])   . Hypertension   . Hypothyroidism   . Irritable bowel syndrome   . Nocturia   . Renal disorder    kidney stones  . Seasonal allergies   . SVT (supraventricular tachycardia) ([place])   . Thyroid disorder   . Urolithiasis     Past Surgical History:  Procedure Laterality Date  . ABDOMINAL HYSTERECTOMY  1986  . APPENDECTOMY  1954  . BACK SURGERY  09/2016  . bladder tack  09/1984  . CHOLECYSTECTOMY  01/1999  . COLONOSCOPY  06/2006   This was her second one  . COLONOSCOPY  07/12/2011   Procedure: COLONOSCOPY;  Surgeon: Rogene Houston, MD;  [facility];  Service: Endoscopy;  Laterality: N/A;  10:30 am  . COLONOSCOPY N/A 04/03/2018   Procedure: COLONOSCOPY;  Surgeon: Rogene Houston, MD;  [facility];  Service: Endoscopy;  Laterality: N/A;  830  . CYSTOSCOPY W/ URETERAL STENT PLACEMENT Left 10/03/2014   Procedure: CYSTOSCOPY WITH URETERAL STENT PLACEMENT;  Surgeon: Malka So, MD;  [facility];  Service: Urology;  Laterality: Left;  . CYSTOSCOPY WITH URETEROSCOPY AND STENT PLACEMENT Left 03/16/2015   Procedure: CYSTOSCOPY WITH LEFT STENT PLACEMENT;  Surgeon: Franchot Gallo, MD;  [facility];  Service: Urology;  Laterality: Left;  . gout removal  2017  . HOLMIUM LASER  APPLICATION Left 12/28/8784   Procedure: HOLMIUM LASER APPLICATION;  Surgeon: Franchot Gallo, MD;  [facility];  Service: Urology;  Laterality: Left;  . KNEE ARTHROSCOPY  12/2010   Left knee  . LITHOTRIPSY  06/1995  . LUMBAR FUSION  06/2016  . NEPHROLITHOTOMY Left 01/07/2015   Procedure: NEPHROLITHOTOMY PERCUTANEOUS;  Surgeon: Franchot Gallo, MD;  [facility];  Service: Urology;  Laterality: Left;  With STENT  . POLYPECTOMY  04/03/2018   Procedure: POLYPECTOMY;  Surgeon: Rogene Houston, MD;  [facility];  Service: Endoscopy;;  transverse,splenic flexure, sigmoid  . Rotor Cuff  2011   Right Shoulder  . STENT REMOVAL Left 03/16/2015   Procedure: LEFT URETERAL STENT REMOVAL;  Surgeon: Franchot Gallo, MD;  [facility];  Service: Urology;  Laterality: Left;  . THYROID EXPLORATION N/A 07/31/2013   Procedure: neck EXPLORATION;  Surgeon: Odis Hollingshead, MD;  [facility];  Service: General;  Laterality: N/A;  . THYROIDECTOMY N/A 07/31/2013   Procedure: PARATHYROIDECTOMY;  Surgeon: Odis Hollingshead, MD;  [facility];  Service: General;  Laterality: N/A;  . TUMOR REMOVAL  06/1997   Beign; on back x2 surgeries  . TUMOR REMOVAL  2002   "FATTY TUMORS" REMOVED FROM BACK  . URETEROSCOPY WITH HOLMIUM LASER LITHOTRIPSY Left 03/16/2015   Procedure:  LEFT URETEROSCOPIC LASER LITHOTRIPSY WITH STONE EXTRACTION;  Surgeon: Franchot Gallo, MD;  [facility];  Service: Urology;  Laterality: Left;    Home Medications:  (Not in a hospital admission)   Allergies:  Allergies  Allergen Reactions  . Ace Inhibitors Other (See Comments)    Patient doesn't remember this allergy.  On 12/30/2014 received LOV visit from PCP , Dr Rory Percy in [place], [place].  - No known active drug allergies listed in his office visit note dated 05/21/2014.    Marland Kitchen Venlafaxine Anxiety    Family History  Problem Relation Age of Onset  . Hypertension Mother   . Arthritis Mother   .  Hypertension Father   . Healthy Brother   . Healthy Daughter   . Healthy Daughter   . Colon cancer Neg Hx     Social History:  reports that she has never smoked. She has never used smokeless tobacco. She reports current alcohol use. She reports that she does not use drugs.  ROS: A complete review of systems was performed.  All systems are negative except for pertinent findings as noted.  Physical Exam:  Vital signs in last 24 hours: @VSRANGES @ General:  Alert and oriented, No acute distress HEENT: Normocephalic, atraumatic Neck: No JVD or lymphadenopathy Cardiovascular: Regular rate  Lungs: Normal inspiratory/expiratory excursion Extremities: No edema Neurologic: Grossly intact  I have reviewed prior pt notes  I have reviewed notes from referring/previous physicians  I have reviewed urinalysis results.  pH 7.0.  No evidence of infection.  I have independently reviewed prior imaging--most recent KUB and KUB from mid 2021 were reviewed.  Stable stone burden, largest stone is left lower pole measuring 10.9 mm this year.    Impression/Assessment:  Urolithiasis, stable, on potassiums citrate  Plan:  I told her I think it is fine to take her potassium citrate twice a day  I will see her back in 1 year with KUB  [name] Griffin Dewilde 11/22/2020, 8:55 PM  [name]. Maxtyn Nuzum MD

## 2020-11-23 ENCOUNTER — Encounter: Payer: Self-pay | Admitting: Urology

## 2020-11-23 ENCOUNTER — Other Ambulatory Visit: Payer: Self-pay

## 2020-11-23 ENCOUNTER — Ambulatory Visit (INDEPENDENT_AMBULATORY_CARE_PROVIDER_SITE_OTHER): Payer: Medicare Other | Admitting: Urology

## 2020-11-23 VITALS — BP 152/89 | HR 71 | Temp 98.4°F | Ht 63.0 in | Wt 200.0 lb

## 2020-11-23 DIAGNOSIS — N200 Calculus of kidney: Secondary | ICD-10-CM

## 2020-11-23 LAB — MICROSCOPIC EXAMINATION
RBC, Urine: NONE SEEN /hpf (ref 0–2)
Renal Epithel, UA: NONE SEEN /hpf

## 2020-11-23 LAB — URINALYSIS, ROUTINE W REFLEX MICROSCOPIC
Bilirubin, UA: NEGATIVE
Glucose, UA: NEGATIVE
Ketones, UA: NEGATIVE
Nitrite, UA: NEGATIVE
Protein,UA: NEGATIVE
RBC, UA: NEGATIVE
Specific Gravity, UA: 1.02 (ref 1.005–1.030)
Urobilinogen, Ur: 0.2 mg/dL (ref 0.2–1.0)
pH, UA: 7 (ref 5.0–7.5)

## 2020-11-23 NOTE — Addendum Note (Signed)
Addended by: Valentina Lucks on: 11/23/2020 04:26 PM   Modules accepted: Orders

## 2020-11-23 NOTE — Progress Notes (Signed)
Urological Symptom Review  Patient is experiencing the following symptoms: Frequent urination Get up at night to urinate Leakage of urine   Review of Systems  Gastrointestinal (upper)  : Negative for upper GI symptoms  Gastrointestinal (lower) : Constipation  Constitutional : Negative for symptoms  Skin: Negative for skin symptoms  Eyes: Negative for eye symptoms  Ear/Nose/Throat : Negative for Ear/Nose/Throat symptoms  Hematologic/Lymphatic: Negative for Hematologic/Lymphatic symptoms  Cardiovascular : Negative for cardiovascular symptoms  Respiratory : Negative for respiratory symptoms  Endocrine: Negative for endocrine symptoms  Musculoskeletal: Back pain Joint pain  Neurological: Negative for neurological symptoms  Psychologic: Negative for psychiatric symptoms

## 2020-12-13 DIAGNOSIS — M19072 Primary osteoarthritis, left ankle and foot: Secondary | ICD-10-CM | POA: Diagnosis not present

## 2020-12-13 DIAGNOSIS — M25572 Pain in left ankle and joints of left foot: Secondary | ICD-10-CM | POA: Diagnosis not present

## 2021-01-11 ENCOUNTER — Other Ambulatory Visit: Payer: Self-pay

## 2021-01-11 ENCOUNTER — Telehealth: Payer: Self-pay | Admitting: Urology

## 2021-01-11 DIAGNOSIS — N200 Calculus of kidney: Secondary | ICD-10-CM

## 2021-01-11 MED ORDER — POTASSIUM CITRATE ER 15 MEQ (1620 MG) PO TBCR: 1.0000 | EXTENDED_RELEASE_TABLET | Freq: Three times a day (TID) | ORAL | 11 refills | Status: DC

## 2021-01-11 NOTE — Telephone Encounter (Signed)
Pt called needing a refill on her Potassium Citrate 15 MEQ (1620 MG) TBCR. Please call patient and let her know when it's been sent. Thanks

## 2021-01-11 NOTE — Telephone Encounter (Signed)
Prescription refill submitted.  Patient called and notified.

## 2021-01-18 DIAGNOSIS — R928 Other abnormal and inconclusive findings on diagnostic imaging of breast: Secondary | ICD-10-CM | POA: Diagnosis not present

## 2021-02-02 DIAGNOSIS — M5416 Radiculopathy, lumbar region: Secondary | ICD-10-CM | POA: Diagnosis not present

## 2021-02-02 DIAGNOSIS — M47816 Spondylosis without myelopathy or radiculopathy, lumbar region: Secondary | ICD-10-CM | POA: Diagnosis not present

## 2021-03-30 DIAGNOSIS — I10 Essential (primary) hypertension: Secondary | ICD-10-CM | POA: Diagnosis not present

## 2021-03-30 DIAGNOSIS — D519 Vitamin B12 deficiency anemia, unspecified: Secondary | ICD-10-CM | POA: Diagnosis not present

## 2021-03-30 DIAGNOSIS — E7800 Pure hypercholesterolemia, unspecified: Secondary | ICD-10-CM | POA: Diagnosis not present

## 2021-03-30 DIAGNOSIS — E039 Hypothyroidism, unspecified: Secondary | ICD-10-CM | POA: Diagnosis not present

## 2021-03-30 DIAGNOSIS — E7801 Familial hypercholesterolemia: Secondary | ICD-10-CM | POA: Diagnosis not present

## 2021-03-30 DIAGNOSIS — R739 Hyperglycemia, unspecified: Secondary | ICD-10-CM | POA: Diagnosis not present

## 2021-03-30 DIAGNOSIS — Z131 Encounter for screening for diabetes mellitus: Secondary | ICD-10-CM | POA: Diagnosis not present

## 2021-03-30 DIAGNOSIS — N184 Chronic kidney disease, stage 4 (severe): Secondary | ICD-10-CM | POA: Diagnosis not present

## 2021-03-31 DIAGNOSIS — M85831 Other specified disorders of bone density and structure, right forearm: Secondary | ICD-10-CM | POA: Diagnosis not present

## 2021-03-31 DIAGNOSIS — M85851 Other specified disorders of bone density and structure, right thigh: Secondary | ICD-10-CM | POA: Diagnosis not present

## 2021-03-31 DIAGNOSIS — M85852 Other specified disorders of bone density and structure, left thigh: Secondary | ICD-10-CM | POA: Diagnosis not present

## 2021-03-31 DIAGNOSIS — M810 Age-related osteoporosis without current pathological fracture: Secondary | ICD-10-CM | POA: Diagnosis not present

## 2021-04-01 DIAGNOSIS — Z23 Encounter for immunization: Secondary | ICD-10-CM | POA: Diagnosis not present

## 2021-04-01 DIAGNOSIS — I10 Essential (primary) hypertension: Secondary | ICD-10-CM | POA: Diagnosis not present

## 2021-04-20 DIAGNOSIS — Z6841 Body Mass Index (BMI) 40.0 and over, adult: Secondary | ICD-10-CM | POA: Diagnosis not present

## 2021-04-20 DIAGNOSIS — M961 Postlaminectomy syndrome, not elsewhere classified: Secondary | ICD-10-CM | POA: Diagnosis not present

## 2021-04-20 DIAGNOSIS — M4126 Other idiopathic scoliosis, lumbar region: Secondary | ICD-10-CM | POA: Diagnosis not present

## 2021-04-20 DIAGNOSIS — I10 Essential (primary) hypertension: Secondary | ICD-10-CM | POA: Diagnosis not present

## 2021-04-20 DIAGNOSIS — M545 Low back pain, unspecified: Secondary | ICD-10-CM | POA: Diagnosis not present

## 2021-04-20 DIAGNOSIS — M47816 Spondylosis without myelopathy or radiculopathy, lumbar region: Secondary | ICD-10-CM | POA: Diagnosis not present

## 2021-04-20 DIAGNOSIS — M438X9 Other specified deforming dorsopathies, site unspecified: Secondary | ICD-10-CM | POA: Diagnosis not present

## 2021-04-21 ENCOUNTER — Other Ambulatory Visit (HOSPITAL_COMMUNITY): Payer: Self-pay | Admitting: Neurosurgery

## 2021-04-21 DIAGNOSIS — M545 Low back pain, unspecified: Secondary | ICD-10-CM

## 2021-04-25 DIAGNOSIS — U071 COVID-19: Secondary | ICD-10-CM | POA: Diagnosis not present

## 2021-04-25 DIAGNOSIS — Z1321 Encounter for screening for nutritional disorder: Secondary | ICD-10-CM | POA: Diagnosis not present

## 2021-04-25 DIAGNOSIS — E559 Vitamin D deficiency, unspecified: Secondary | ICD-10-CM | POA: Diagnosis not present

## 2021-04-25 DIAGNOSIS — M8580 Other specified disorders of bone density and structure, unspecified site: Secondary | ICD-10-CM | POA: Diagnosis not present

## 2021-04-27 DIAGNOSIS — M961 Postlaminectomy syndrome, not elsewhere classified: Secondary | ICD-10-CM | POA: Diagnosis not present

## 2021-04-27 DIAGNOSIS — M4126 Other idiopathic scoliosis, lumbar region: Secondary | ICD-10-CM | POA: Diagnosis not present

## 2021-05-05 ENCOUNTER — Ambulatory Visit (INDEPENDENT_AMBULATORY_CARE_PROVIDER_SITE_OTHER): Payer: Medicare Other

## 2021-05-05 DIAGNOSIS — I340 Nonrheumatic mitral (valve) insufficiency: Secondary | ICD-10-CM

## 2021-05-09 LAB — ECHOCARDIOGRAM COMPLETE
AR max vel: 1.25 cm2
AV Area VTI: 1.35 cm2
AV Area mean vel: 1.31 cm2
AV Mean grad: 24 mmHg
AV Peak grad: 40.2 mmHg
Ao pk vel: 3.17 m/s
Area-P 1/2: 2.16 cm2
S' Lateral: 3.13 cm
Single Plane A4C EF: 70.3 %

## 2021-05-11 ENCOUNTER — Ambulatory Visit (HOSPITAL_COMMUNITY)
Admission: RE | Admit: 2021-05-11 | Discharge: 2021-05-11 | Disposition: A | Discharge status: Home / Self Care | Payer: Medicare Other | Source: Ambulatory Visit | Attending: Neurosurgery | Admitting: Neurosurgery

## 2021-05-11 ENCOUNTER — Other Ambulatory Visit: Payer: Self-pay

## 2021-05-11 DIAGNOSIS — G8929 Other chronic pain: Secondary | ICD-10-CM | POA: Diagnosis not present

## 2021-05-11 DIAGNOSIS — M5134 Other intervertebral disc degeneration, thoracic region: Secondary | ICD-10-CM | POA: Diagnosis not present

## 2021-05-11 DIAGNOSIS — Z981 Arthrodesis status: Secondary | ICD-10-CM | POA: Diagnosis not present

## 2021-05-11 DIAGNOSIS — M4324 Fusion of spine, thoracic region: Secondary | ICD-10-CM | POA: Diagnosis not present

## 2021-05-11 DIAGNOSIS — M545 Low back pain, unspecified: Secondary | ICD-10-CM | POA: Diagnosis not present

## 2021-05-11 IMAGING — CT CT L SPINE W/O CM
3 of 4 series · 14 of 33 positions shown, 17 images · non-contrast
Comparison: CT lumbar spine [DATE]

CLINICAL DATA: Chronic low back pain.  Prior back surgery.

EXAM:
CT LUMBAR SPINE WITHOUT CONTRAST
TECHNIQUE: Multidetector CT imaging of the lumbar spine was performed without
intravenous contrast administration. Multiplanar CT image
reconstructions were also generated.

[Series 4: l spine soft · axial · 0.43mm/px · z∈[+1156,+1312]mm · 6 of 102 slices shown, 8 images]
[im 16/102  soft-tissue]
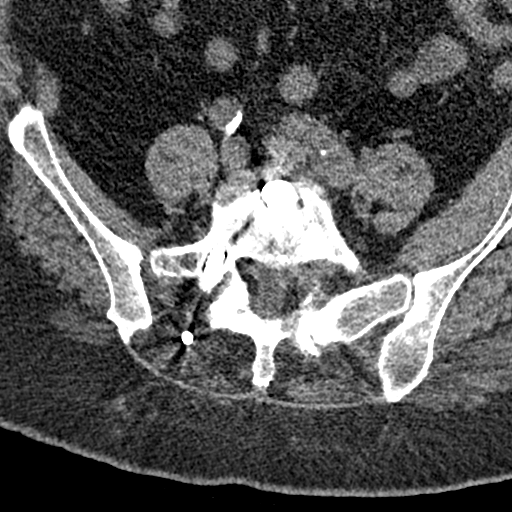
[im 16/102  bone]
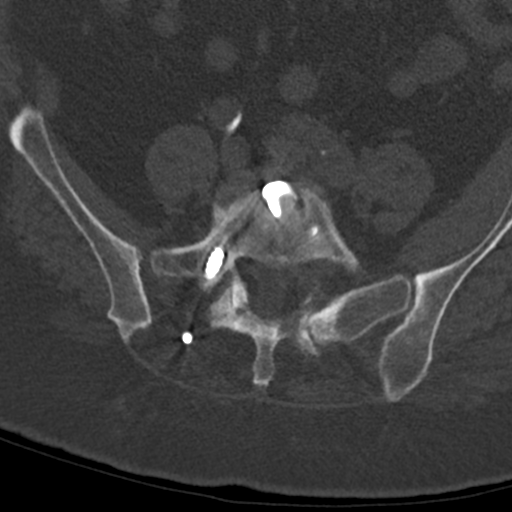
[im 32/102  bone]
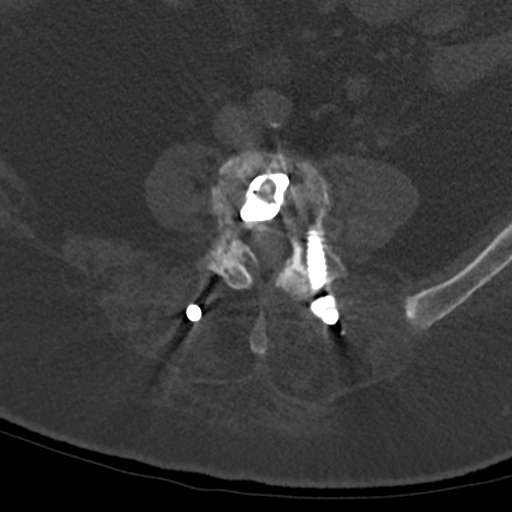
[im 47/102  bone]
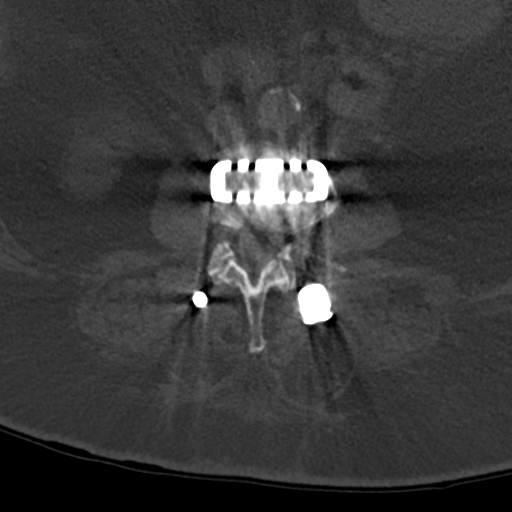
[im 63/102  bone]
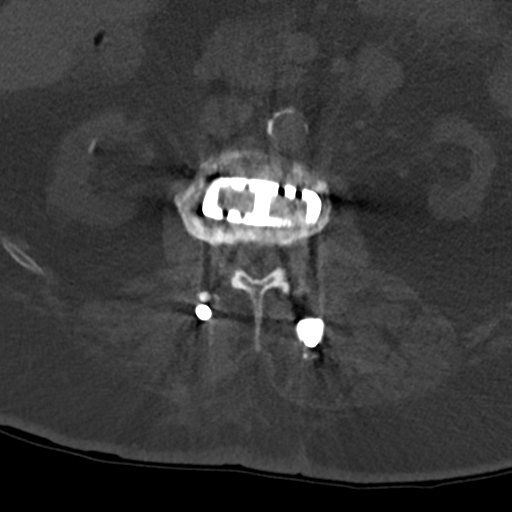
[im 78/102  soft-tissue]
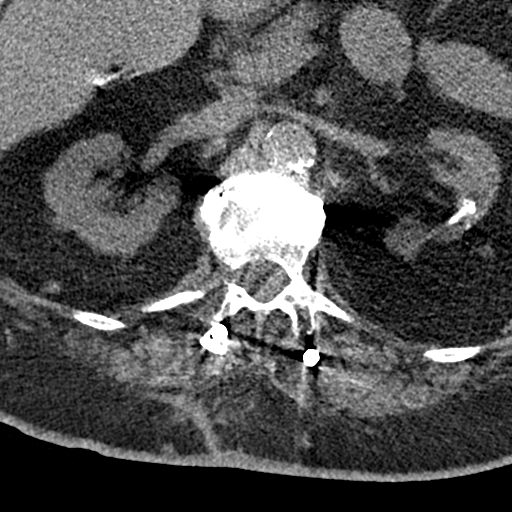
[im 78/102  bone]
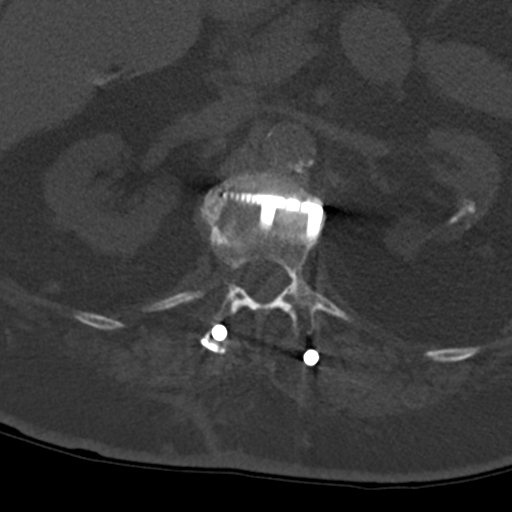
[im 94/102  bone]
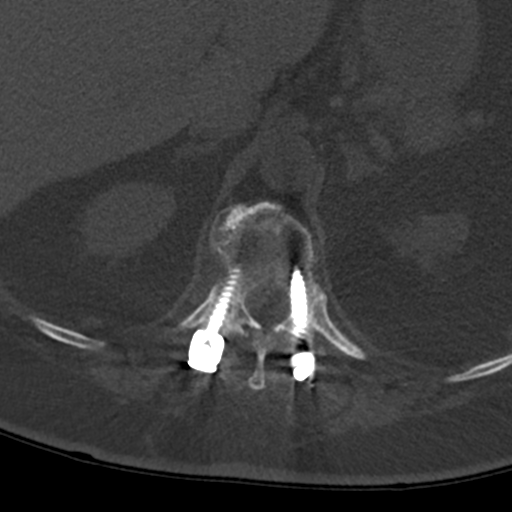

[Series 5: sagittal bone · sagittal · 0.40mm/px · 5 of 80 slices shown, 6 images]
[im 27/80  bone]
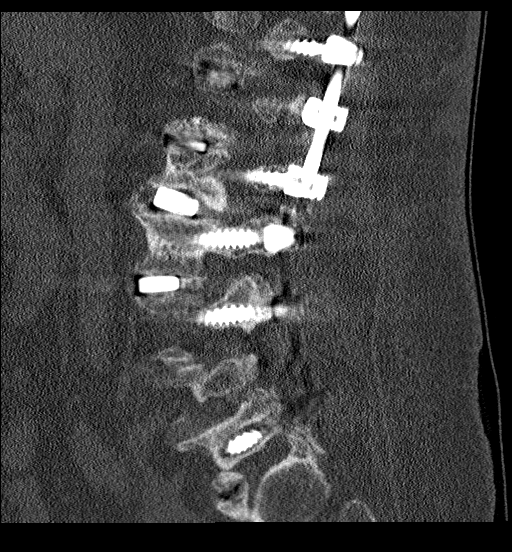
[im 33/80  bone]
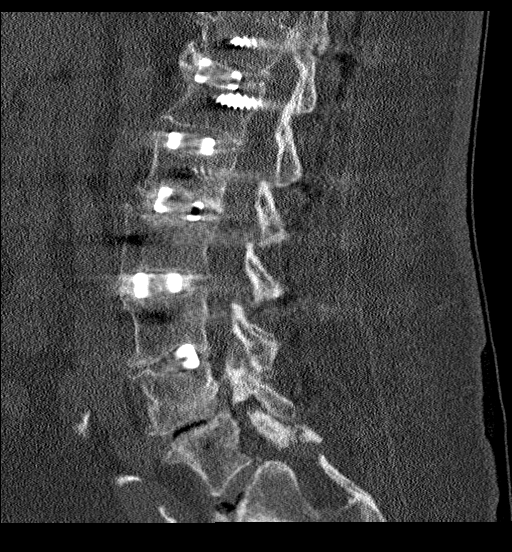
[im 40/80  soft-tissue]
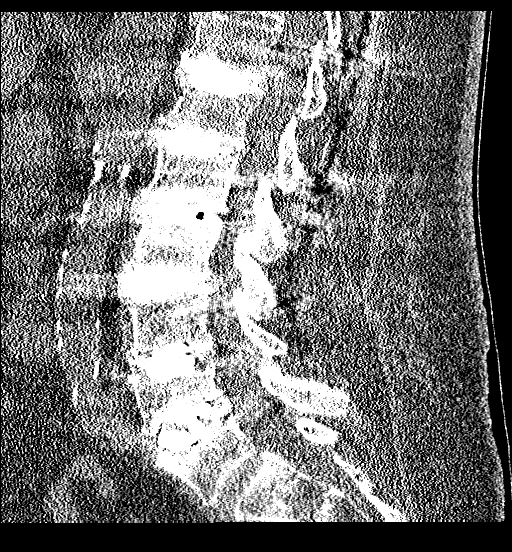
[im 40/80  bone]
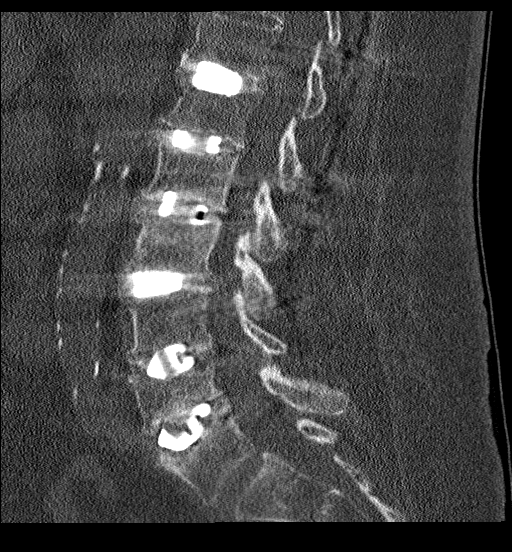
[im 47/80  bone]
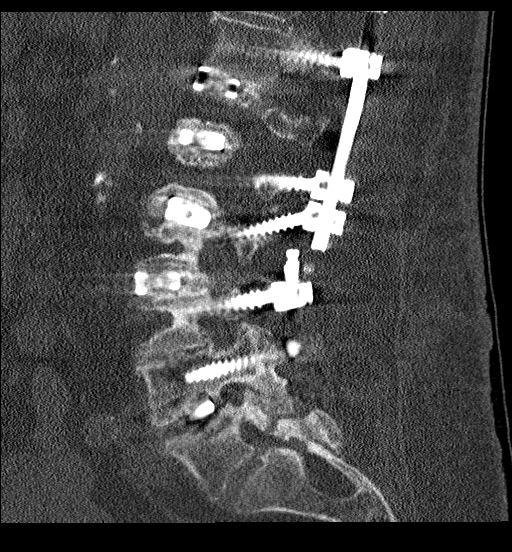
[im 53/80  bone]
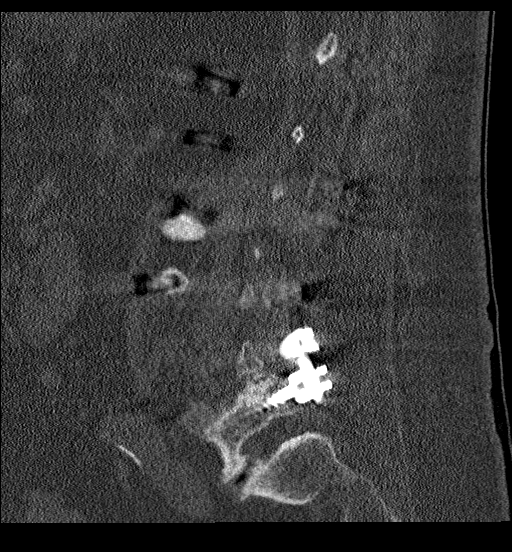

[Series 6: coronal bone · coronal · 0.35mm/px · 3 of 88 slices shown]
[im 22/88  bone]
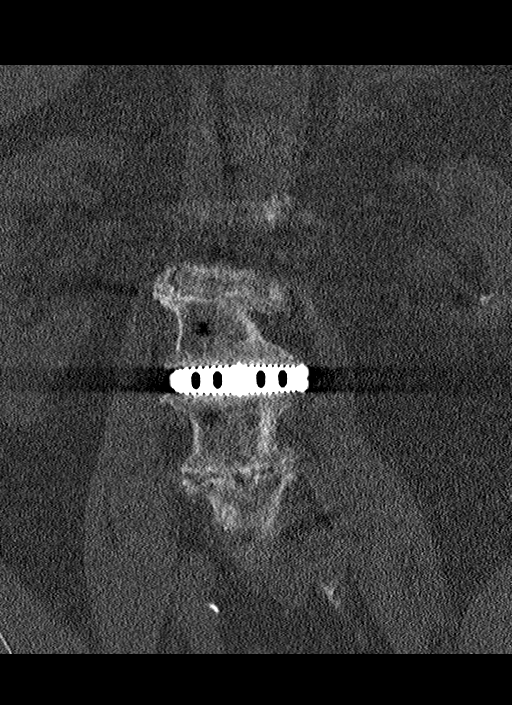
[im 44/88  bone]
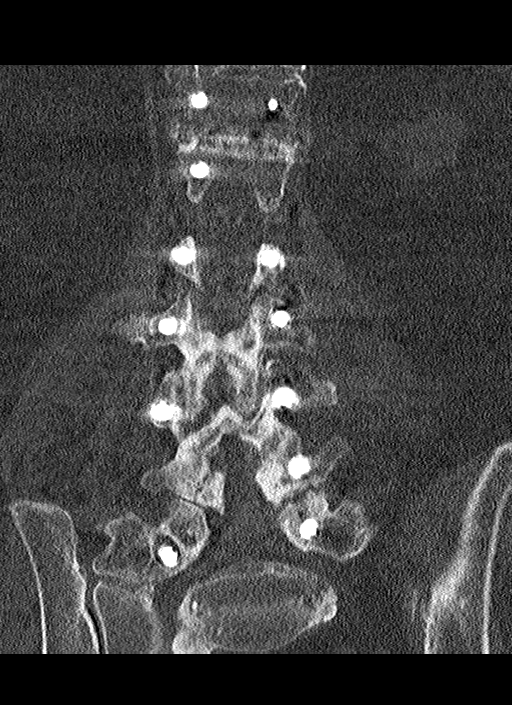
[im 66/88  bone]
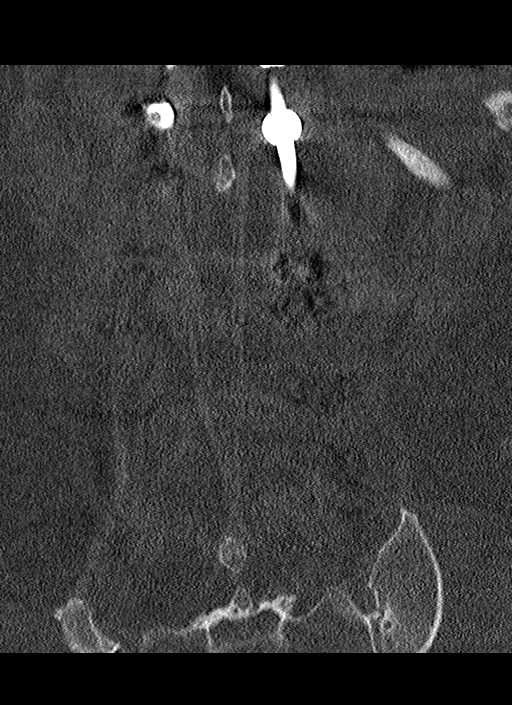

[14 of 33 positions shown; findings below may reference images not displayed]

FINDINGS: Segmentation: Normal.  Lowest fully developed disc space L5-S1.

Alignment: Normal

Vertebrae: There is a unilateral pars defect of L5 on the left
unchanged from the prior study.

Negative for lumbar fracture or mass.

Hardware: Pedicle screw and rod fusion is present bilaterally
extending from T9 through L5 in satisfactory position. There is
significant loosening around the right L5 screw, unchanged from the
prior study. No other hardware loosening or failure identified. No
screw on the right at L4. Interbody spacers in the disc spaces
between T11 and L5.

Paraspinal and other soft tissues: Negative for paraspinous mass or
adenopathy. Atherosclerotic calcification aorta and iliacs.
Bilateral urinary tract calculi without hydronephrosis.

Disc levels: L1-2: Pedicle screw and interbody fusion. Negative for
stenosis

L2-3: Pedicle screw and interbody fusion.  Negative for stenosis

L3-4: Pedicle screw and interbody fusion.  Negative for stenosis

L4-5: Pedicle screw and interbody fusion. There appears to be solid
bridging bone through the spacer on the left.

L5-S1: Unilateral pars defect of L5 on the left. Loosening of the
right L5 screw. No significant stenosis.
IMPRESSION: Multilevel lumbar fusion with solid fusion between L1 and L5. There
is loosening of the right L5 screw unchanged

There is unilateral pars defect on the left of L5 unchanged.

Stable CT compared with the prior study of [DATE]

## 2021-05-11 IMAGING — CT CT T SPINE W/O CM
3 of 4 series · 13 of 35 positions shown, 14 images · non-contrast
Comparison: CT thoracic spine [DATE]

CLINICAL DATA: Chronic low back pain.  Prior back fusion.

EXAM:
CT THORACIC SPINE WITHOUT CONTRAST
TECHNIQUE: Multidetector CT images of the thoracic were obtained using the
standard protocol without intravenous contrast.

[Series 3: t spine soft · axial · 0.43mm/px · z∈[+1323,+1521]mm · 4 of 144 slices shown, 5 images]
[im 23/144  soft-tissue]
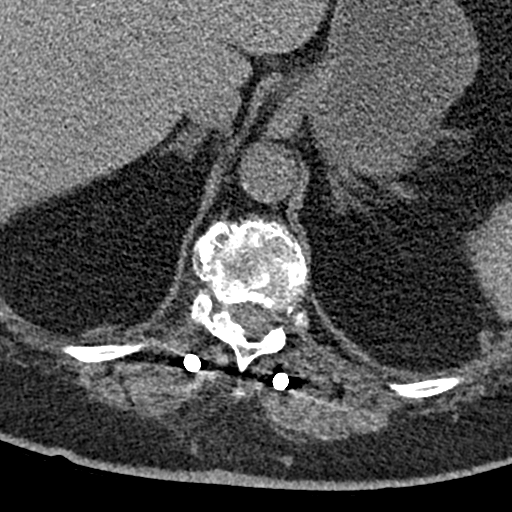
[im 23/144  bone]
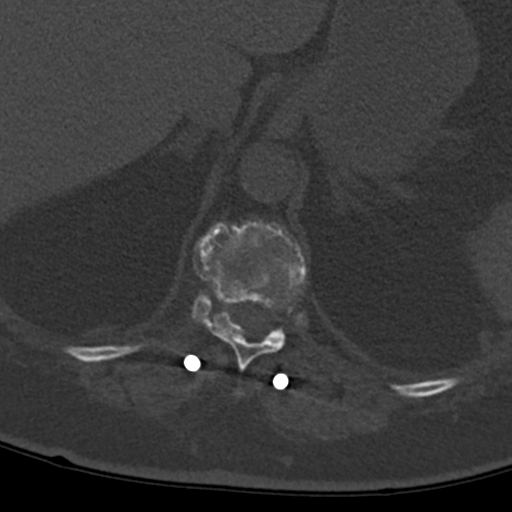
[im 56/144  bone]
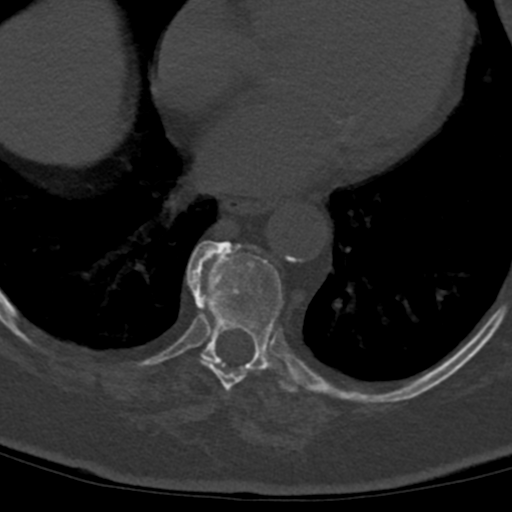
[im 89/144  bone]
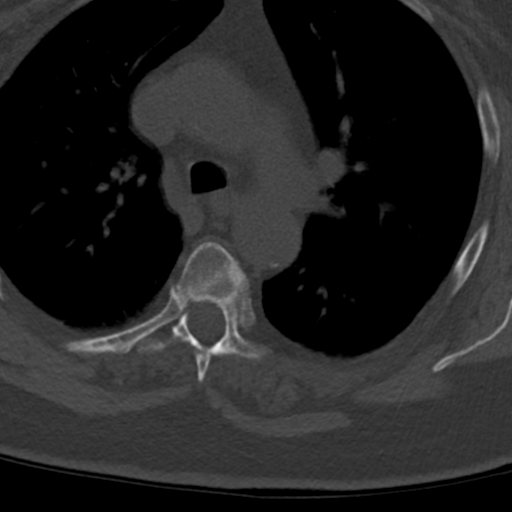
[im 122/144  bone]
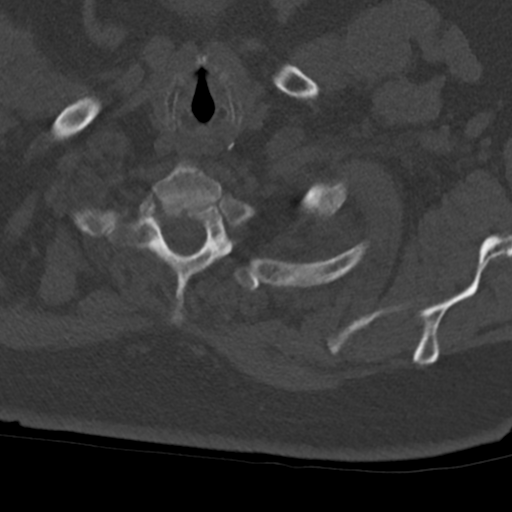

[Series 4: sag bone · sagittal · 0.43mm/px · 6 of 61 slices shown]
[im 11/61  bone]
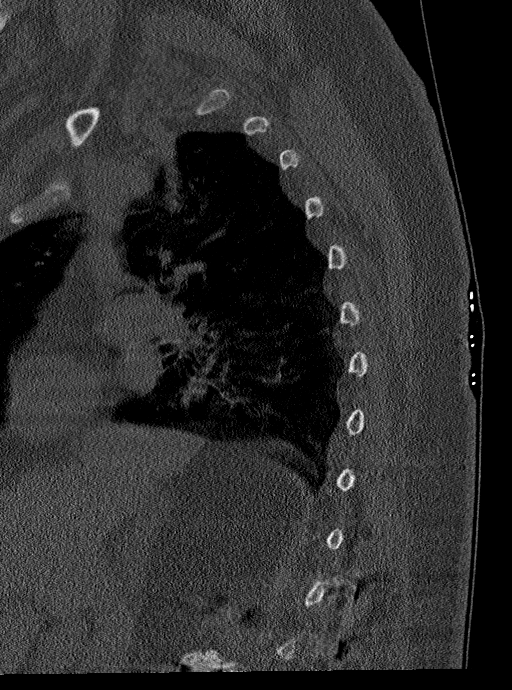
[im 17/61  soft-tissue]
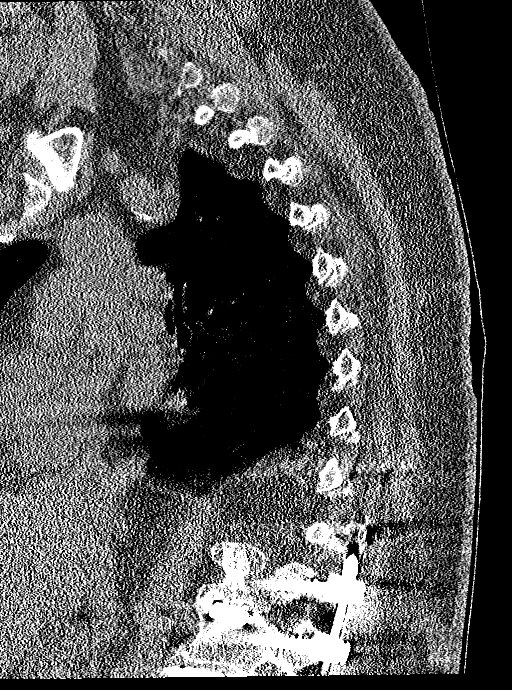
[im 21/61  bone]
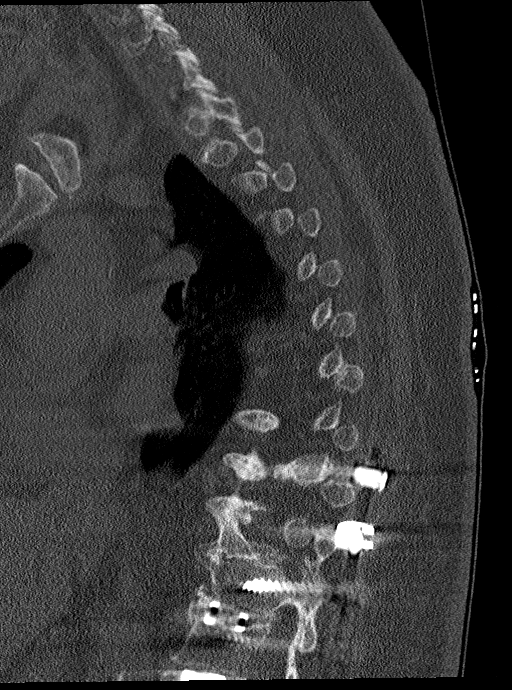
[im 31/61  bone]
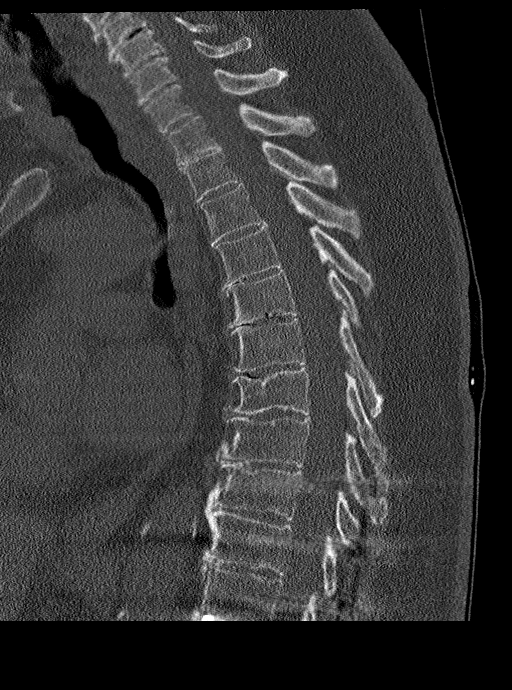
[im 41/61  bone]
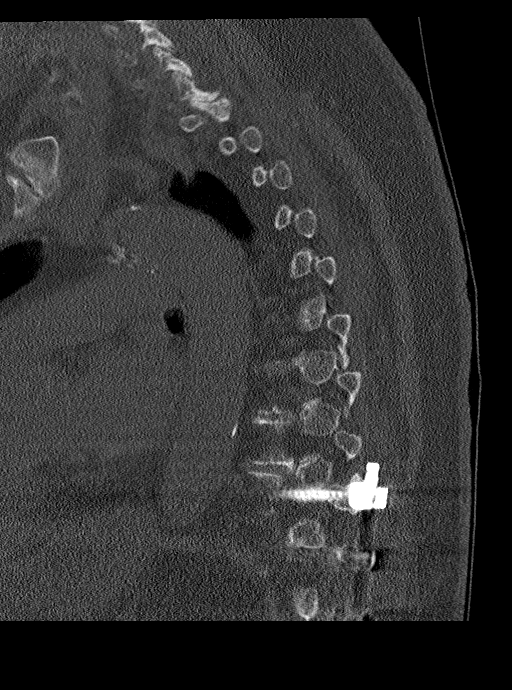
[im 51/61  bone]
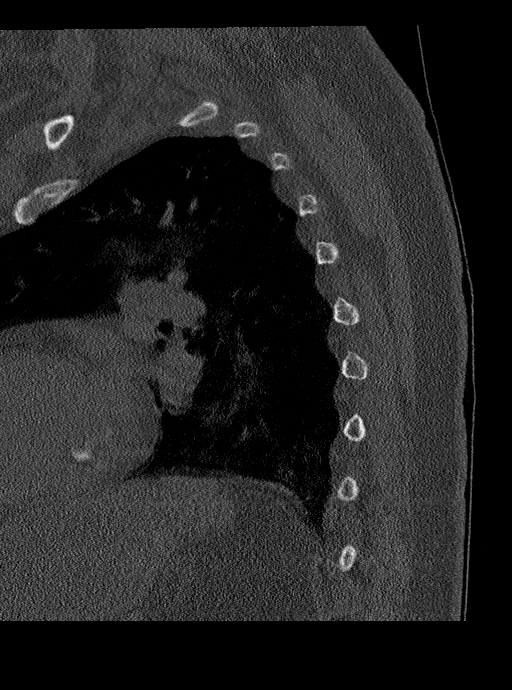

[Series 6: cor bone · coronal · 0.36mm/px · 3 of 111 slices shown]
[im 23/111  bone]
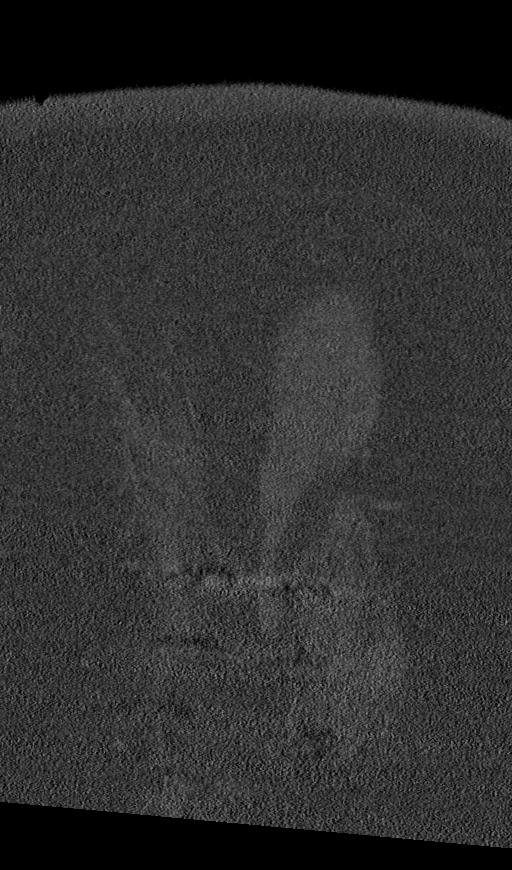
[im 45/111  bone]
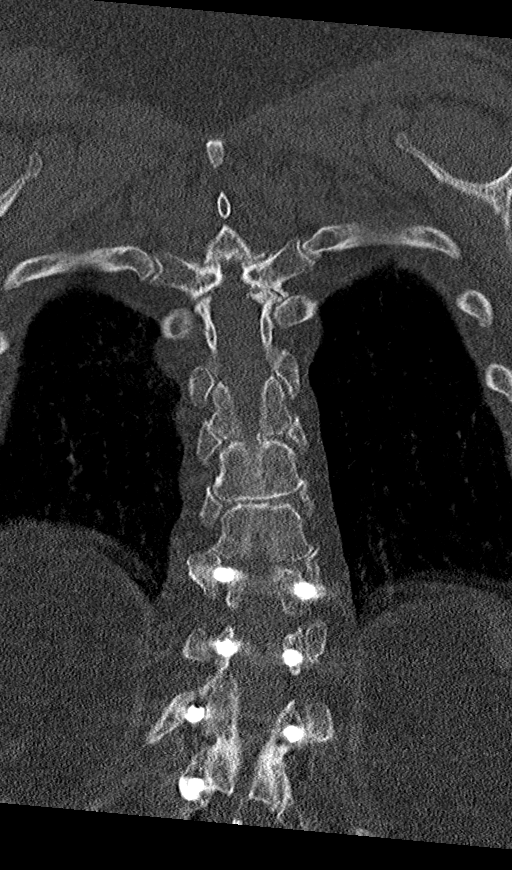
[im 67/111  bone]
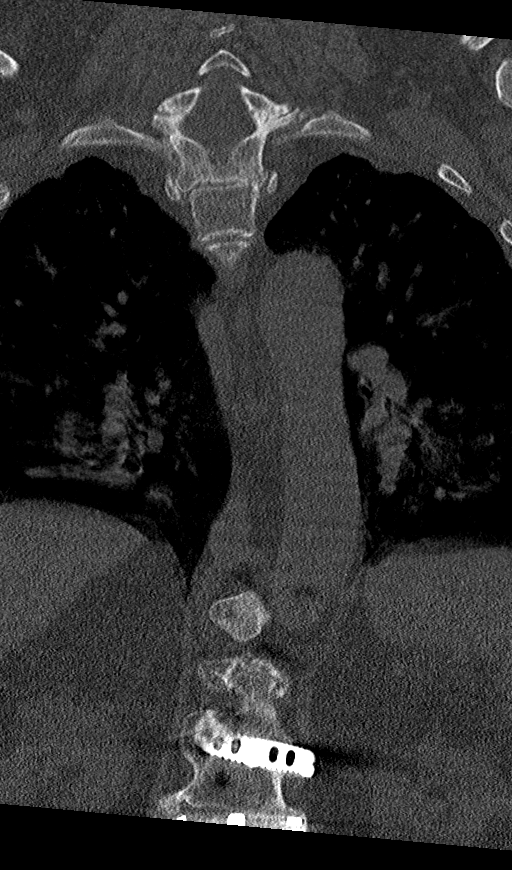

[13 of 35 positions shown; findings below may reference images not displayed]

FINDINGS: Alignment: Normal

Vertebrae: Negative for fracture or mass lesion.

Hardware: Pedicle screw fusion bilaterally T9 through L5. Interbody
spacers at T11-12 and T12-L1 in good position. No hardware loosening
or failure.

Paraspinal and other soft tissues: Negative for paraspinous mass or
adenopathy. Visualized lungs are clear.

Disc levels: Disc degeneration is present throughout the thoracic
spine with disc space narrowing and mild spurring. No significant
spinal or foraminal stenosis identified.
IMPRESSION: Negative for spinal stenosis.  Negative for fracture

No change from the prior CT [DATE]

Lower thoracic fusion hardware in satisfactory position and
unchanged.

## 2021-05-17 DIAGNOSIS — Z20828 Contact with and (suspected) exposure to other viral communicable diseases: Secondary | ICD-10-CM | POA: Diagnosis not present

## 2021-05-20 ENCOUNTER — Ambulatory Visit (HOSPITAL_COMMUNITY): Payer: Medicare Other

## 2021-06-06 ENCOUNTER — Other Ambulatory Visit: Payer: Self-pay | Admitting: Neurosurgery

## 2021-06-06 ENCOUNTER — Other Ambulatory Visit (HOSPITAL_COMMUNITY): Payer: Self-pay | Admitting: Neurosurgery

## 2021-06-06 DIAGNOSIS — M545 Low back pain, unspecified: Secondary | ICD-10-CM | POA: Diagnosis not present

## 2021-06-06 DIAGNOSIS — G8929 Other chronic pain: Secondary | ICD-10-CM

## 2021-06-06 DIAGNOSIS — I10 Essential (primary) hypertension: Secondary | ICD-10-CM | POA: Diagnosis not present

## 2021-06-06 DIAGNOSIS — Z6841 Body Mass Index (BMI) 40.0 and over, adult: Secondary | ICD-10-CM | POA: Diagnosis not present

## 2021-06-07 ENCOUNTER — Telehealth: Payer: Self-pay | Admitting: *Deleted

## 2021-06-07 NOTE — Telephone Encounter (Signed)
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 This is not of concern but just something to monitor with another echo in about 2 years.   Zandra Abts MD

## 2021-06-07 NOTE — Telephone Encounter (Signed)
Laurine Blazer, LPN  8/85/0277  4:12 PM EDT Back to Top    Notified, copy to pcp.

## 2021-06-16 ENCOUNTER — Other Ambulatory Visit: Payer: Self-pay

## 2021-06-16 ENCOUNTER — Ambulatory Visit (HOSPITAL_COMMUNITY)
Admission: RE | Admit: 2021-06-16 | Discharge: 2021-06-16 | Disposition: A | Discharge status: Home / Self Care | Payer: Medicare Other | Source: Ambulatory Visit | Attending: Neurosurgery | Admitting: Neurosurgery

## 2021-06-16 DIAGNOSIS — M545 Low back pain, unspecified: Secondary | ICD-10-CM | POA: Insufficient documentation

## 2021-06-16 DIAGNOSIS — G8929 Other chronic pain: Secondary | ICD-10-CM | POA: Diagnosis not present

## 2021-06-16 MED ORDER — GADOBUTROL 1 MMOL/ML IV SOLN
10.0000 mL | Freq: Once | INTRAVENOUS | Status: AC | PRN
  Administered 2021-06-16: 10 mL via INTRAVENOUS

## 2021-06-28 DIAGNOSIS — M4126 Other idiopathic scoliosis, lumbar region: Secondary | ICD-10-CM | POA: Diagnosis not present

## 2021-06-28 DIAGNOSIS — S32009K Unspecified fracture of unspecified lumbar vertebra, subsequent encounter for fracture with nonunion: Secondary | ICD-10-CM | POA: Diagnosis not present

## 2021-06-28 DIAGNOSIS — M5416 Radiculopathy, lumbar region: Secondary | ICD-10-CM | POA: Diagnosis not present

## 2021-06-28 DIAGNOSIS — M545 Low back pain, unspecified: Secondary | ICD-10-CM | POA: Diagnosis not present

## 2021-06-28 DIAGNOSIS — M961 Postlaminectomy syndrome, not elsewhere classified: Secondary | ICD-10-CM | POA: Diagnosis not present

## 2021-07-06 ENCOUNTER — Encounter: Payer: Self-pay | Admitting: Cardiology

## 2021-07-06 ENCOUNTER — Encounter: Payer: Self-pay | Admitting: *Deleted

## 2021-07-06 ENCOUNTER — Ambulatory Visit (INDEPENDENT_AMBULATORY_CARE_PROVIDER_SITE_OTHER): Payer: Medicare Other | Admitting: Cardiology

## 2021-07-06 VITALS — BP 147/89 | HR 64 | Ht 63.0 in | Wt 237.6 lb

## 2021-07-06 DIAGNOSIS — I471 Supraventricular tachycardia: Secondary | ICD-10-CM | POA: Diagnosis not present

## 2021-07-06 DIAGNOSIS — I350 Nonrheumatic aortic (valve) stenosis: Secondary | ICD-10-CM

## 2021-07-06 NOTE — Patient Instructions (Addendum)
Medication Instructions:  Your physician recommends that you continue on your current medications as directed. Please refer to the Current Medication list given to you today.  Labwork: none  Testing/Procedures: none  Follow-Up: Your physician recommends that you schedule a follow-up appointment in: 1 year. You will receive a reminder letter or call in the mail in about 10 months reminding you to call and schedule your appointment. If you don't receive this letter, please contact our office.  Any Other Special Instructions Will Be Listed Below (If Applicable).  If you need a refill on your cardiac medications before your next appointment, please call your pharmacy.

## 2021-07-06 NOTE — Progress Notes (Signed)
Clinical Summary Natasha Warren is a 75 y.o.female seen today for follow up of the following medical problems.    1. PSVT - no recent palpitaitons, continues to do very well on beta blocker    2. Prior Chest pain/MSK pain - history of inermittent chest dull pain 2-3/10. At rest or with exertion, often with laying down. Can occur after meals. Lasts approx 15-20 minutes. No SOB or palpitations. Ongoing for 1 year, symptoms unchanged since last visit - no recent symptoms.    - seen in ER 03/29/17 with chest pain, primarily left scapular pain radiating into chest. From notes thought to be MSK pain.EKG and cardiac enzymes unremarkable.  Discharged with meloxicam.   - no recent chest pain since last visit.   2018-01-19 ER visit with chest pain. Notes indicate worst with palpation. EKG nonacute, enzymes negative. Symptoms typical of her prior left chest/shoulder blade pain.  - followed up with Dr Carloyn Manner, had MRI showed bone spurs in shoulder blade - symptoms have resolved.    - no recent chest pains.      3. Aortic stenosis - 04/2021 echo LVEF 60-65%, moderate AS mean grad 24 AVA VTI 1.35  -denies any sigificant symptoms  4. Chronic back pain - multiple prior surgeries    SH: Her husband SHONNIE POUDRIER is also a patient of mine, he recently passed away Jan 19, 2017. Had been married 6 years. Has 2 daughters in the area. 3 grandkids.          Past Medical History:  Diagnosis Date   Arthritis    Bulging lumbar disc    Colon polyps    Diabetes mellitus without complication ([facility])    History of gout    History of gout    Hyperlipidemia    Hyperparathyroidism, primary ([place])    Hypertension    Hypothyroidism    Irritable bowel syndrome    Nocturia    Renal disorder    kidney stones   Seasonal allergies    SVT (supraventricular tachycardia) ([facility])    Thyroid disorder    Urolithiasis      Allergies  Allergen Reactions   Ace Inhibitors Other (See Comments)    Patient doesn't  remember this allergy.  On 12/30/2014 received LOV visit from PCP , Dr Rory Percy in [place], [place].  - No known active drug allergies listed in his office visit note dated 05/21/2014.     Venlafaxine Anxiety     Current Outpatient Medications  Medication Sig Dispense Refill   acetaminophen (TYLENOL) 500 MG tablet Take 1,000 mg by mouth every 6 (six) hours as needed for moderate pain or headache.      allopurinol (ZYLOPRIM) 300 MG tablet Take 300 mg by mouth daily.   5   aspirin EC 81 MG tablet Take 81 mg by mouth daily.     atorvastatin (LIPITOR) 20 MG tablet Take 20 mg by mouth daily.     Cholecalciferol (VITAMIN D3) 1000 units CAPS Take 1,000 Units by mouth daily.     docusate sodium (COLACE) 50 MG capsule Take 250 mg by mouth daily.      gabapentin (NEURONTIN) 300 MG capsule Take 300 mg by mouth at bedtime.     levothyroxine (SYNTHROID, LEVOTHROID) 50 MCG tablet Take 50 mcg by mouth daily before breakfast.     losartan (COZAAR) 100 MG tablet Take 100 mg by mouth daily.   1   metFORMIN (GLUCOPHAGE) 500 MG tablet Take 500 mg by mouth  daily.     metFORMIN (GLUCOPHAGE-XR) 500 MG 24 hr tablet Take 500 mg by mouth daily with breakfast.   5   metoprolol succinate (TOPROL-XL) 50 MG 24 hr tablet Take 1 & 1/2 tablets daily. (Patient taking differently: Take 75 mg by mouth daily.) 45 tablet 135   oxyCODONE-acetaminophen (PERCOCET/ROXICET) 5-325 MG tablet Take 1 tablet by mouth 2 (two) times a day.      Potassium Citrate 15 MEQ (1620 MG) TBCR Take 1 tablet by mouth 3 (three) times daily. 120 tablet 11   tizanidine (ZANAFLEX) 2 MG capsule Take 2 mg by mouth 3 (three) times daily as needed for muscle spasms.      zinc gluconate 50 MG tablet Take 50 mg by mouth daily.     No current facility-administered medications for this visit.   Facility-Administered Medications Ordered in Other Visits  Medication Dose Route Frequency Provider Last Rate Last Admin   acetaminophen (TYLENOL) tablet 650 mg  650 mg Oral  Q4H PRN Irine Seal, MD         Past Surgical History:  Procedure Laterality Date   ABDOMINAL HYSTERECTOMY  [place] SURGERY  09/2016   bladder tack  09/1984   CHOLECYSTECTOMY  01/1999   COLONOSCOPY  06/2006   This was her second one   COLONOSCOPY  07/12/2011   Procedure: COLONOSCOPY;  Surgeon: Rogene Houston, MD;  [facility];  Service: Endoscopy;  Laterality: N/A;  10:30 am   COLONOSCOPY N/A 04/03/2018   Procedure: COLONOSCOPY;  Surgeon: Rogene Houston, MD;  [facility];  Service: Endoscopy;  Laterality: N/A;  [place] Left 10/03/2014   Procedure: CYSTOSCOPY WITH URETERAL STENT PLACEMENT;  Surgeon: Malka So, MD;  [facility];  Service: Urology;  Laterality: Left;   CYSTOSCOPY WITH URETEROSCOPY AND STENT PLACEMENT Left 03/16/2015   Procedure: CYSTOSCOPY WITH LEFT STENT PLACEMENT;  Surgeon: Franchot Gallo, MD;  [facility]: [facility];  Service: Urology;  Laterality: Left;   gout removal  2017   HOLMIUM LASER APPLICATION Left 0/97/3532   Procedure: HOLMIUM LASER APPLICATION;  Surgeon: Franchot Gallo, MD;  [facility];  Service: Urology;  Laterality: Left;   KNEE ARTHROSCOPY  12/2010   Left knee   LITHOTRIPSY  06/1995   LUMBAR FUSION  06/2016   NEPHROLITHOTOMY Left 01/07/2015   Procedure: NEPHROLITHOTOMY PERCUTANEOUS;  Surgeon: Franchot Gallo, MD;  [facility];  Service: Urology;  Laterality: Left;  With STENT   POLYPECTOMY  04/03/2018   Procedure: POLYPECTOMY;  Surgeon: Rogene Houston, MD;  [facility];  Service: Endoscopy;;  transverse,splenic flexure, sigmoid   Rotor Cuff  2011   Right Shoulder   STENT REMOVAL Left 03/16/2015   Procedure: LEFT URETERAL STENT REMOVAL;  Surgeon: Franchot Gallo, MD;  [facility];  Service: Urology;  Laterality: Left;   THYROID EXPLORATION N/A 07/31/2013   Procedure: neck EXPLORATION;  Surgeon: Odis Hollingshead, MD;   [facility];  Service: General;  Laterality: N/A;   THYROIDECTOMY N/A 07/31/2013   Procedure: PARATHYROIDECTOMY;  Surgeon: Odis Hollingshead, MD;  [facility];  Service: General;  Laterality: N/A;   TUMOR REMOVAL  06/1997   Beign; on back x2 surgeries   TUMOR REMOVAL  2002   "FATTY TUMORS" REMOVED FROM BACK   URETEROSCOPY WITH HOLMIUM LASER LITHOTRIPSY Left 03/16/2015   Procedure: LEFT URETEROSCOPIC LASER LITHOTRIPSY WITH STONE EXTRACTION;  Surgeon: Franchot Gallo, MD;  [facility];  Service: Urology;  Laterality: Left;     Allergies  Allergen Reactions   Ace Inhibitors Other (See Comments)    Patient doesn't remember this allergy.  On 12/30/2014 received LOV visit from PCP , Dr Rory Percy in [place], [place].  - No known active drug allergies listed in his office visit note dated 05/21/2014.     Venlafaxine Anxiety      Family History  Problem Relation Age of Onset   Hypertension Mother    Arthritis Mother    Hypertension Father    Healthy Brother    Healthy Daughter    Healthy Daughter    Colon cancer Neg Hx      Social History Ms. Puryear reports that she has never smoked. She has never used smokeless tobacco. Ms. Sachdev reports current alcohol use.   Review of Systems CONSTITUTIONAL: No weight loss, fever, chills, weakness or fatigue.  HEENT: Eyes: No visual loss, blurred vision, double vision or yellow sclerae.No hearing loss, sneezing, congestion, runny nose or sore throat.  SKIN: No rash or itching.  CARDIOVASCULAR: per hpi RESPIRATORY: No shortness of breath, cough or sputum.  GASTROINTESTINAL: No anorexia, nausea, vomiting or diarrhea. No abdominal pain or blood.  GENITOURINARY: No burning on urination, no polyuria NEUROLOGICAL: No headache, dizziness, syncope, paralysis, ataxia, numbness or tingling in the extremities. No change in bowel or bladder control.  MUSCULOSKELETAL: No muscle, back pain, joint pain or stiffness.  LYMPHATICS: No enlarged  nodes. No history of splenectomy.  PSYCHIATRIC: No history of depression or anxiety.  ENDOCRINOLOGIC: No reports of sweating, cold or heat intolerance. No polyuria or polydipsia.  Marland Kitchen   Physical Examination Today's Vitals   07/06/21 0946  BP: (!) 147/89  Pulse: 64  SpO2: 100%  Weight: 237 lb 9.6 oz (107.8 kg)  Height: 5\' 3"  (1.6 m)   Body mass index is 42.09 kg/m.  Gen: resting comfortably, no acute distress HEENT: no scleral icterus, pupils equal round and reactive, no palptable cervical adenopathy,  CV: RRR, 3/6 systolic murmur RUSB, no jvd Resp: Clear to auscultation bilaterally GI: abdomen is soft, non-tender, non-distended, normal bowel sounds, no hepatosplenomegaly MSK: extremities are warm, no edema.  Skin: warm, no rash Neuro:  no focal deficits Psych: appropriate affect   Diagnostic Studies  Jan 2017 echo Study Conclusions  - Left ventricle: The cavity size was normal. Wall thickness was   increased in a pattern of mild LVH. Systolic function was normal.   The estimated ejection fraction was in the range of 60% to 65%.   Wall motion was normal; there were no regional wall motion   abnormalities. Doppler parameters are consistent with abnormal   left ventricular relaxation (grade 1 diastolic dysfunction). - Aortic valve: Mildly calcified annulus. - Mitral valve: Calcified annulus. There was mild regurgitation. - Tricuspid valve: There was mild regurgitation.  04/2020 ECHO [facility] Summary    1. The left ventricle is normal in size with mildly increased wall   thickness.    2. The left ventricular systolic function is normal, LVEF is visually   estimated at 65-70%.    3. There is grade I diastolic dysfunction (impaired relaxation).    4. Mitral annular calcification is present.    5. There is moderate aortic valve stenosis.    6. The left atrium is mildly dilated in size.    7. The right ventricle is normal in size, with normal systolic function.    8. There  is  mild pulmonary hypertension, estimated pulmonary artery systolic   pressure is 41 mmHg.         Left Ventricle    The left ventricle is normal in size with mildly increased wall thickness.    The left ventricular systolic function is normal, LVEF is visually estimated   at 65-70%.    There is grade I diastolic dysfunction (impaired relaxation).      Right Ventricle    The right ventricle is normal in size, with normal systolic function.         Left Atrium    The left atrium is mildly dilated in size.      Right Atrium    The right atrium is normal  in size.         Aortic Valve    The aortic valve is trileaflet with mildly thickened leaflets with mildly   reduced excursion.    There is no significant aortic regurgitation.    There is moderate aortic valve stenosis.    Peak AV transvalvular velocity:  2.8 m/s.    Mean gradient: 18 mmHg.    Doppler velocity index: 0.41.    Estimated aortic valve area (VTI): 1.3 cm2.    Estimated aortic valve area (velocity): 1.3 cm2.    LVOT diameter: 2.0 cm.    LV stroke volume index: 33.4 ml/m2.      Pulmonic Valve    The pulmonic valve is normal.    There is no significant pulmonic regurgitation.    There is no evidence of a significant transvalvular gradient.      Mitral Valve    The mitral valve leaflets are normal with normal leaflet mobility.    Mitral annular calcification is present.    There is trivial mitral valve regurgitation.      Tricuspid Valve    The tricuspid valve leaflets are normal, with normal leaflet mobility.    There is mild tricuspid regurgitation.    There is mild pulmonary hypertension, estimated pulmonary artery systolic   pressure is 41 mmHg.         Other Findings    Rhythm: Sinus Rhythm.      Pericardium/Pleural    There is no pericardial effusion.      Inferior Vena Cava    IVC size and inspiratory change suggest normal right atrial pressure. (0-5   mmHg).      Aorta    The aorta is normal  in size in the visualized segments.      04/2020 echo [facility] Summary    1. The left ventricle is normal in size with mildly increased wall  thickness.    2. The left ventricular systolic function is normal, LVEF is visually  estimated at 65-70%.    3. There is grade I diastolic dysfunction (impaired relaxation).    4. Mitral annular calcification is present.    5. There is moderate aortic valve stenosis.    6. The left atrium is mildly dilated in size.    7. The right ventricle is normal in size, with normal systolic function.    8. There is mild pulmonary hypertension, estimated pulmonary artery systolic  pressure is 41 mmHg.     04/2021 echo IMPRESSIONS     1. Left ventricular ejection fraction, by estimation, is 60 to 65%. The  left ventricle has normal function. The left ventricle has no regional  wall motion abnormalities. There is mild left ventricular hypertrophy.  Left ventricular diastolic parameters  are indeterminate.   2. Right ventricular systolic function is normal. The right ventricular  size is normal. There is mildly elevated pulmonary artery systolic  pressure. The estimated right ventricular systolic pressure is 38.8 mmHg.   3. The mitral valve is grossly normal. Trivial mitral valve  regurgitation.   4. The aortic valve is tricuspid. There is moderate calcification of the  aortic valve. Aortic valve regurgitation is not visualized. Moderate  aortic valve stenosis. Aortic valve mean gradient measures 24.0 mmHg.  Aortic valve Vmax measures 3.17 m/s.  Dimentionless index 0.47.   5. The inferior vena cava is normal in size with greater than 50%  respiratory variability, suggesting right atrial pressure of 3 mmHg.    Assessment and Plan  1. PSVT - has done well on beta blocker, no symptoms - continue metoprolol - EKG today shows NSR   2. Aortic stenosis - moderate by recent echo, no symptoms, continue to monitor   F/u 1 year.       Arnoldo Lenis, M.D.

## 2021-07-27 DIAGNOSIS — M5416 Radiculopathy, lumbar region: Secondary | ICD-10-CM | POA: Diagnosis not present

## 2021-07-27 DIAGNOSIS — M961 Postlaminectomy syndrome, not elsewhere classified: Secondary | ICD-10-CM | POA: Diagnosis not present

## 2021-09-16 DIAGNOSIS — M170 Bilateral primary osteoarthritis of knee: Secondary | ICD-10-CM | POA: Diagnosis not present

## 2021-09-16 DIAGNOSIS — M25572 Pain in left ankle and joints of left foot: Secondary | ICD-10-CM | POA: Diagnosis not present

## 2021-11-16 ENCOUNTER — Other Ambulatory Visit: Payer: Self-pay

## 2021-11-16 ENCOUNTER — Ambulatory Visit (HOSPITAL_COMMUNITY)
Admission: RE | Admit: 2021-11-16 | Discharge: 2021-11-16 | Disposition: A | Discharge status: Home / Self Care | Payer: Medicare Other | Source: Ambulatory Visit | Attending: Urology | Admitting: Urology

## 2021-11-16 DIAGNOSIS — N200 Calculus of kidney: Secondary | ICD-10-CM | POA: Insufficient documentation

## 2021-11-16 IMAGING — DX DG ABDOMEN 1V
2 series · 2 of 2 positions shown · non-contrast
Comparison: [DATE]

CLINICAL DATA: Kidney stone

EXAM:
ABDOMEN - 1 VIEW

[abdomen kub (1 of 2)]
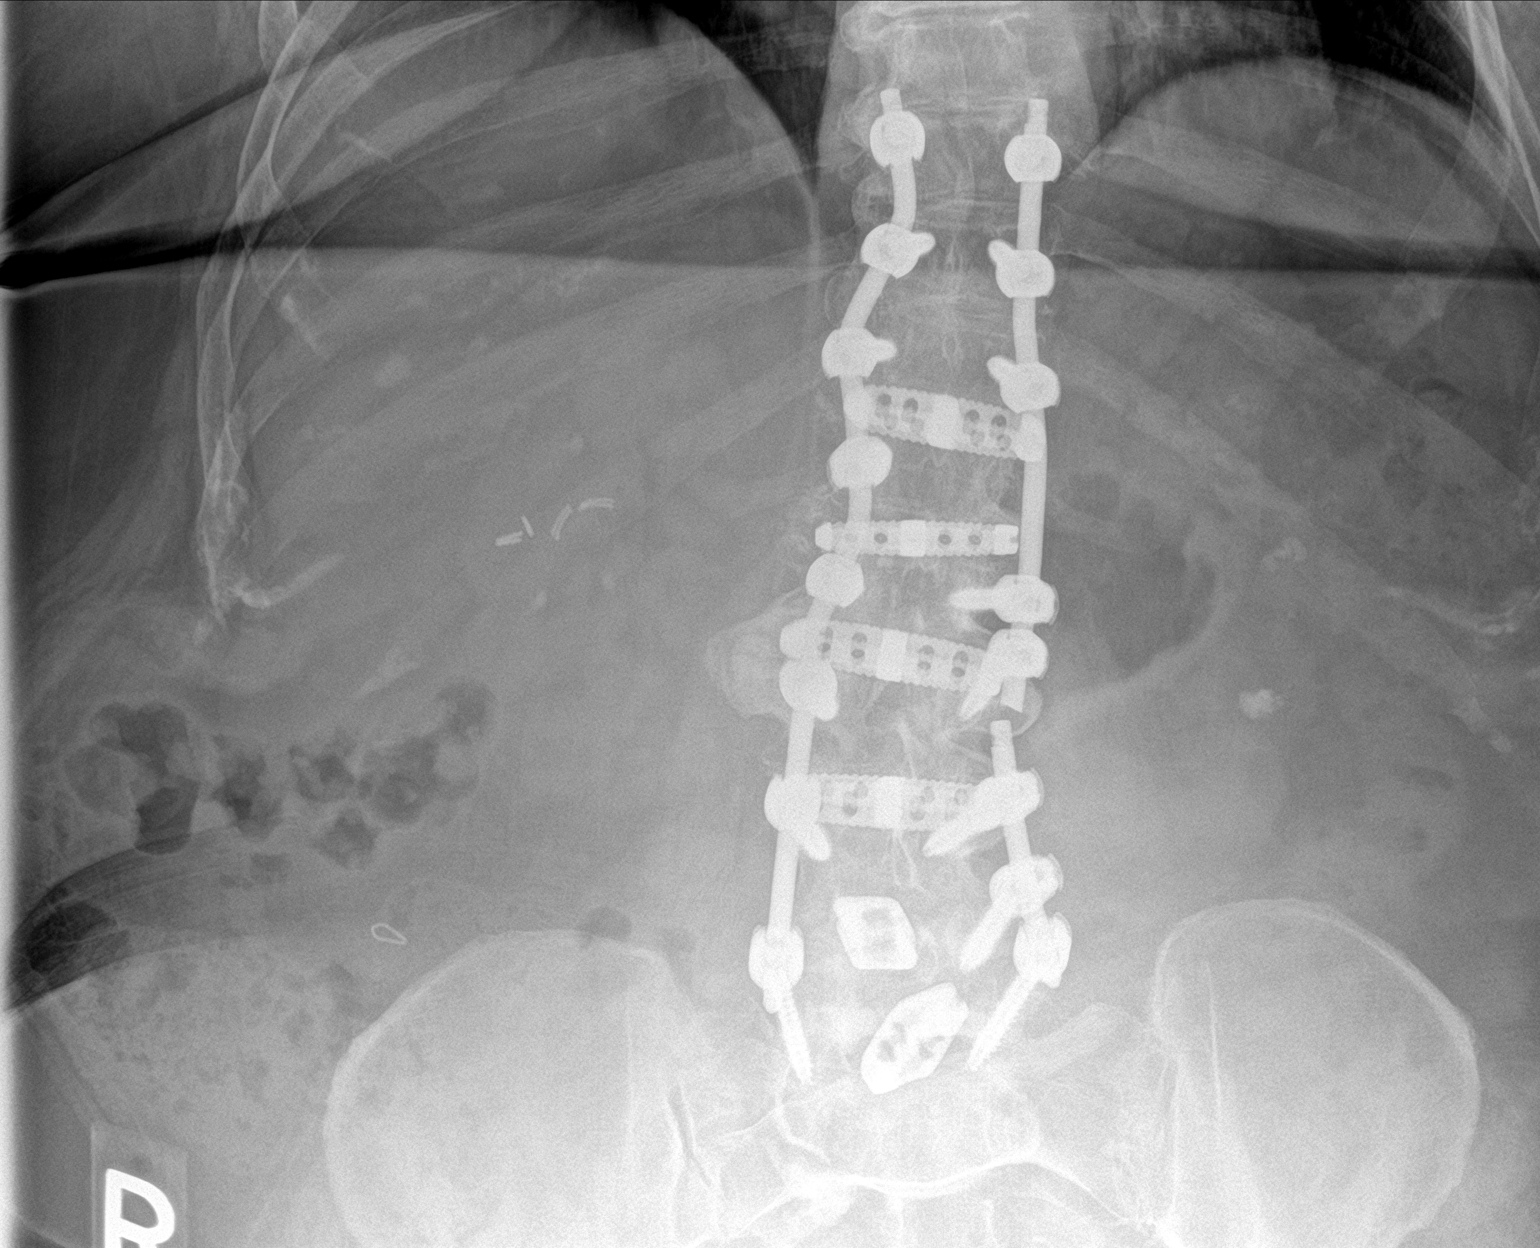

[abdomen kub (2 of 2)]
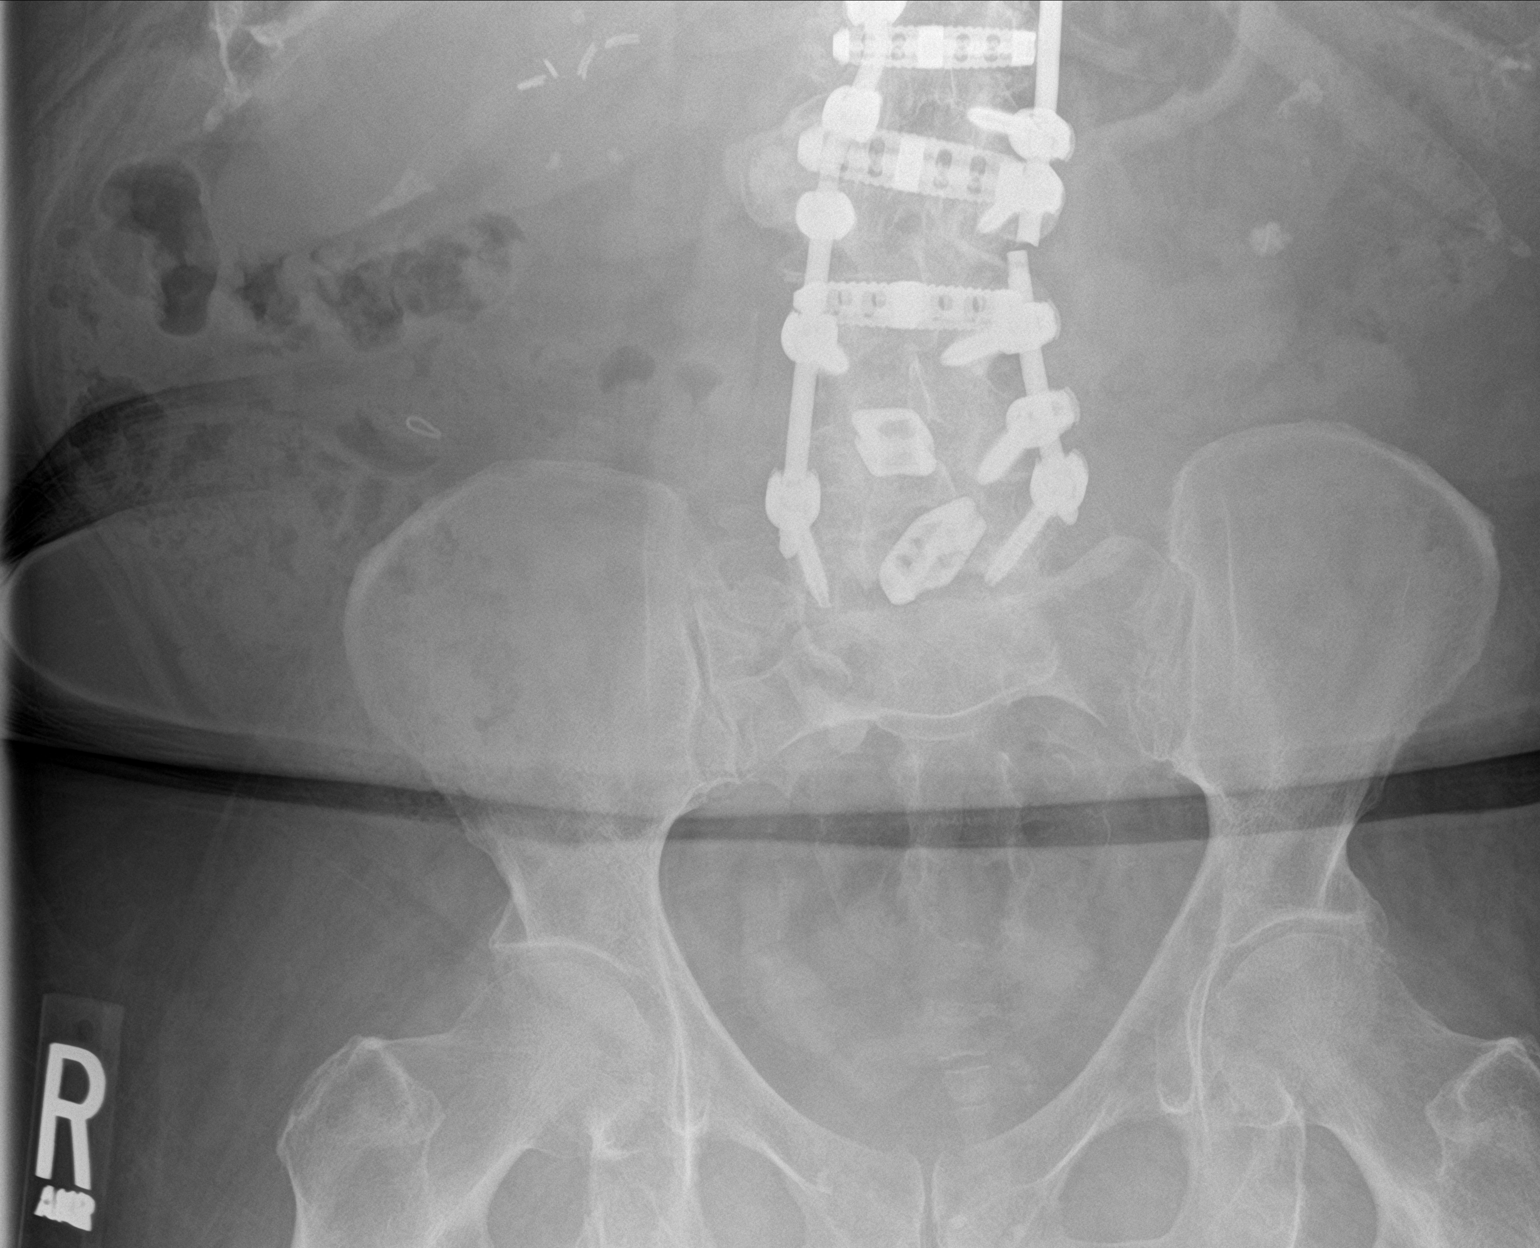

[2 of 2 positions shown; findings below may reference images not displayed]

FINDINGS: There is a 4 mm stone near the upper pole of the right kidney. There
is 10 mm stone at the lower pole of the left kidney. Nonobstructive
bowel gas pattern. Extensive spinal hardware is unchanged.
IMPRESSION: Unchanged bilateral nephrolithiasis.

## 2021-11-20 NOTE — Progress Notes (Signed)
?History of Present Illness: Natasha Warren is followed here for urolithiasis.  She has had several stone procedures. ?  ?She is on potassium citrate 3 times a day.  She says she usually takes it a couple of times a day.  She has had no stone issues over the past year or 2.  She has had no gross hematuria or dysuria.  She thinks her urine looks a little dark. ? ?3.7.2023: Routine check for this lady.  No stone episodes over the past year.  KUB recently performed revealed 10 mm left lower pole stone, 4 mm right upper pole stone.  She still is on potassiums citrate. ? ?Also complaining of urinary frequency, urgency, urgency incontinence, stress incontinence.  6-7 light pads a day used, almost never saturated.  No urinary tract infections over the past year. ? ? ?Past Medical History:  ?Diagnosis Date  ? Arthritis   ? Bulging lumbar disc   ? Colon polyps   ? Diabetes mellitus without complication ([place])   ? History of gout   ? History of gout   ? Hyperlipidemia   ? Hyperparathyroidism, primary ([place])   ? Hypertension   ? Hypothyroidism   ? Irritable bowel syndrome   ? Nocturia   ? Renal disorder   ? kidney stones  ? Seasonal allergies   ? SVT (supraventricular tachycardia) ([place])   ? Thyroid disorder   ? Urolithiasis   ? ? ?Past Surgical History:  ?Procedure Laterality Date  ? ABDOMINAL HYSTERECTOMY  1986  ? APPENDECTOMY  1954  ? BACK SURGERY  09/2016  ? bladder tack  09/1984  ? CHOLECYSTECTOMY  01/1999  ? COLONOSCOPY  06/2006  ? This was her second one  ? COLONOSCOPY  07/12/2011  ? Procedure: COLONOSCOPY;  Surgeon: Rogene Houston, MD;  [facility];  Service: Endoscopy;  Laterality: N/A;  10:30 am  ? COLONOSCOPY N/A 04/03/2018  ? Procedure: COLONOSCOPY;  Surgeon: Rogene Houston, MD;  [facility];  Service: Endoscopy;  Laterality: N/A;  830  ? CYSTOSCOPY W/ URETERAL STENT PLACEMENT Left 10/03/2014  ? Procedure: CYSTOSCOPY WITH URETERAL STENT PLACEMENT;  Surgeon: Malka So, MD;  [facility];  Service: Urology;  Laterality: Left;  ? CYSTOSCOPY WITH URETEROSCOPY AND STENT PLACEMENT Left 03/16/2015  ? Procedure: CYSTOSCOPY WITH LEFT STENT PLACEMENT;  Surgeon: Franchot Gallo, MD;  [facility];  Service: Urology;  Laterality: Left;  ? gout removal  2017  ? HOLMIUM LASER APPLICATION Left 4/66/5993  ? Procedure: HOLMIUM LASER APPLICATION;  Surgeon: Franchot Gallo, MD;  [facility];  Service: Urology;  Laterality: Left;  ? KNEE ARTHROSCOPY  12/2010  ? Left knee  ? LITHOTRIPSY  06/1995  ? LUMBAR FUSION  06/2016  ? NEPHROLITHOTOMY Left 01/07/2015  ? Procedure: NEPHROLITHOTOMY PERCUTANEOUS;  Surgeon: Franchot Gallo, MD;  [facility];  Service: Urology;  Laterality: Left;  With STENT  ? POLYPECTOMY  04/03/2018  ? Procedure: POLYPECTOMY;  Surgeon: Rogene Houston, MD;  [facility];  Service: Endoscopy;;  transverse,splenic flexure, sigmoid  ? Rotor Cuff  2011  ? Right Shoulder  ? STENT REMOVAL Left 03/16/2015  ? Procedure: LEFT URETERAL STENT REMOVAL;  Surgeon: Franchot Gallo, MD;  [facility];  Service: Urology;  Laterality: Left;  ? THYROID EXPLORATION N/A 07/31/2013  ? Procedure: neck EXPLORATION;  Surgeon: Odis Hollingshead, MD;  [facility];  Service: General;  Laterality: N/A;  ? THYROIDECTOMY N/A 07/31/2013  ?  Procedure: PARATHYROIDECTOMY;  Surgeon: Odis Hollingshead, MD;  [facility];  Service: General;  Laterality: N/A;  ? TUMOR REMOVAL  06/1997  ? Beign; on back x2 surgeries  ? TUMOR REMOVAL  2002  ? "FATTY TUMORS" REMOVED FROM BACK  ? URETEROSCOPY WITH HOLMIUM LASER LITHOTRIPSY Left 03/16/2015  ? Procedure: LEFT URETEROSCOPIC LASER LITHOTRIPSY WITH STONE EXTRACTION;  Surgeon: Franchot Gallo, MD;  [facility];  Service: Urology;  Laterality: Left;  ? ? ?Home Medications:  ?(Not in a hospital admission) ? ? ?Allergies:  ?Allergies  ?Allergen Reactions  ? Ace Inhibitors Other (See Comments)  ?  Patient doesn't remember this allergy.  On 12/30/2014  received LOV visit from PCP , Dr Rory Percy in [place], [place].  - No known active drug allergies listed in his office visit note dated 05/21/2014.    ? Venlafaxine Anxiety  ? ? ?Family History  ?Problem Relation Age of Onset  ? Hypertension Mother   ? Arthritis Mother   ? Hypertension Father   ? Healthy Brother   ? Healthy Daughter   ? Healthy Daughter   ? Colon cancer Neg Hx   ? ? ?Social History:  reports that she has never smoked. She has never used smokeless tobacco. She reports current alcohol use. She reports that she does not use drugs. ? ?ROS: ?A complete review of systems was performed.  All systems are negative except for pertinent findings as noted. ? ?Physical Exam:  ?Vital signs in last 24 hours: ?'@VSRANGES'$ @ ?General:  Alert and oriented, No acute distress ?HEENT: Normocephalic, atraumatic ?Neck: No JVD or lymphadenopathy ?Cardiovascular: Regular rate  ?Lungs: Normal inspiratory/expiratory excursion ?Neurologic: Grossly intact ? ?I have reviewed prior pt notes ? ?I have reviewed urinalysis results ? ?I have independently reviewed prior imaging-KUB images from this year and previous year reviewed.  She also has a left upper pole calculi but seems stable. ? ? ?Impression/Assessment:  ?1.  Bilateral renal calculi.  Stable, on potassiums citrate ? ?2.  Lower urinary tract symptoms-urgency, frequency, urgency and stress incontinence ? ?Plan:  ?1.  Overactive bladder guide sheet given ? ?2.  Continue same medication regimen ? ?3.  I will see back in 1 year with KUB ? ?4.  If urinary symptoms do not resolve/improve with instructions, she will call back sooner and we will consider overactive bladder medication ? ?[name] ?11/20/2021, 8:34 PM  ?[name]. Kaylah Chiasson MD ? ? ?

## 2021-11-22 ENCOUNTER — Encounter: Payer: Self-pay | Admitting: Urology

## 2021-11-22 ENCOUNTER — Other Ambulatory Visit: Payer: Self-pay

## 2021-11-22 ENCOUNTER — Ambulatory Visit (INDEPENDENT_AMBULATORY_CARE_PROVIDER_SITE_OTHER): Payer: Medicare Other | Admitting: Urology

## 2021-11-22 VITALS — BP 102/60 | HR 73

## 2021-11-22 DIAGNOSIS — N200 Calculus of kidney: Secondary | ICD-10-CM | POA: Diagnosis not present

## 2021-11-22 LAB — URINALYSIS, ROUTINE W REFLEX MICROSCOPIC
Bilirubin, UA: NEGATIVE
Glucose, UA: NEGATIVE
Ketones, UA: NEGATIVE
Nitrite, UA: NEGATIVE
Protein,UA: NEGATIVE
RBC, UA: NEGATIVE
Specific Gravity, UA: 1.02 (ref 1.005–1.030)
Urobilinogen, Ur: 1 mg/dL (ref 0.2–1.0)
pH, UA: 7 (ref 5.0–7.5)

## 2021-11-22 LAB — MICROSCOPIC EXAMINATION
RBC, Urine: NONE SEEN /hpf (ref 0–2)
Renal Epithel, UA: NONE SEEN /hpf

## 2022-01-31 ENCOUNTER — Encounter: Payer: Self-pay | Admitting: Urology

## 2022-01-31 ENCOUNTER — Ambulatory Visit (INDEPENDENT_AMBULATORY_CARE_PROVIDER_SITE_OTHER): Payer: Medicare Other | Admitting: Urology

## 2022-01-31 VITALS — BP 143/75 | HR 80

## 2022-01-31 DIAGNOSIS — N3281 Overactive bladder: Secondary | ICD-10-CM

## 2022-01-31 DIAGNOSIS — M545 Low back pain, unspecified: Secondary | ICD-10-CM

## 2022-01-31 DIAGNOSIS — N200 Calculus of kidney: Secondary | ICD-10-CM | POA: Diagnosis not present

## 2022-01-31 DIAGNOSIS — R3915 Urgency of urination: Secondary | ICD-10-CM | POA: Diagnosis not present

## 2022-01-31 DIAGNOSIS — R350 Frequency of micturition: Secondary | ICD-10-CM | POA: Diagnosis not present

## 2022-01-31 NOTE — Progress Notes (Signed)
History of Present Illness: Natasha Warren is followed here for urolithiasis.  She has had several stone procedures. Additionally,  she has undergone parathyroid adenectomy for hyperparathyroidism. ?  ?She is on potassium citrate 3 times a day.  She says she usually takes it a couple of times a day.  She has had no stone issues over the past year or 2.  She has had no gross hematuria or dysuria.  She thinks her urine looks a little dark. ?  ?3.7.2023: Routine check for this lady.  No stone episodes over the past year.  KUB recently performed revealed 10 mm left lower pole stone, 4 mm right upper pole stone.  She still is on potassiums citrate. ? ?Also complaining of urinary frequency, urgency, urgency incontinence, stress incontinence.  6-7 light pads a day used, almost never saturated.  No urinary tract infections over the past year. She was given [facility] behavioral modification sheet. ? ?5.16.2023: Presents today with recent severe right-sided back pain.  She is going to pain management as well as [place] [facility].  She did have lumbar spine films done within the past few days.  She wants to make sure that kidney stones as a cause of her pain are ruled out.  She has had no nausea or vomiting.  She has had no gross hematuria.  Exacerbating maneuver-moving around from side to side, if she lays still this helps. ? ?Past Medical History:  ?Diagnosis Date  ? Arthritis   ? Bulging lumbar disc   ? Colon polyps   ? Diabetes mellitus without complication ([place])   ? History of gout   ? History of gout   ? Hyperlipidemia   ? Hyperparathyroidism, primary ([place])   ? Hypertension   ? Hypothyroidism   ? Irritable bowel syndrome   ? Nocturia   ? Renal disorder   ? kidney stones  ? Seasonal allergies   ? SVT (supraventricular tachycardia) ([place])   ? Thyroid disorder   ? Urolithiasis   ? ? ?Past Surgical History:  ?Procedure Laterality Date  ? ABDOMINAL HYSTERECTOMY  1986  ? APPENDECTOMY  1954  ? BACK SURGERY  09/2016  ? bladder  tack  09/1984  ? CHOLECYSTECTOMY  01/1999  ? COLONOSCOPY  06/2006  ? This was her second one  ? COLONOSCOPY  07/12/2011  ? Procedure: COLONOSCOPY;  Surgeon: Rogene Houston, MD;  [facility];  Service: Endoscopy;  Laterality: N/A;  10:30 am  ? COLONOSCOPY N/A 04/03/2018  ? Procedure: COLONOSCOPY;  Surgeon: Rogene Houston, MD;  [facility];  Service: Endoscopy;  Laterality: N/A;  830  ? CYSTOSCOPY W/ URETERAL STENT PLACEMENT Left 10/03/2014  ? Procedure: CYSTOSCOPY WITH URETERAL STENT PLACEMENT;  Surgeon: Malka So, MD;  [facility];  Service: Urology;  Laterality: Left;  ? CYSTOSCOPY WITH URETEROSCOPY AND STENT PLACEMENT Left 03/16/2015  ? Procedure: CYSTOSCOPY WITH LEFT STENT PLACEMENT;  Surgeon: Franchot Gallo, MD;  Location: [facility];  Service: Urology;  Laterality: Left;  ? gout removal  2017  ? HOLMIUM LASER APPLICATION Left 0/34/7425  ? Procedure: HOLMIUM LASER APPLICATION;  Surgeon: Franchot Gallo, MD;  [facility];  Service: Urology;  Laterality: Left;  ? KNEE ARTHROSCOPY  12/2010  ? Left knee  ? LITHOTRIPSY  06/1995  ? LUMBAR FUSION  06/2016  ? NEPHROLITHOTOMY Left 01/07/2015  ? Procedure: NEPHROLITHOTOMY PERCUTANEOUS;  Surgeon: Franchot Gallo, MD;  [facility];  Service: Urology;  Laterality: Left;  With STENT  ?  POLYPECTOMY  04/03/2018  ? Procedure: POLYPECTOMY;  Surgeon: Rogene Houston, MD;  [facility];  Service: Endoscopy;;  transverse,splenic flexure, sigmoid  ? Rotor Cuff  2011  ? Right Shoulder  ? STENT REMOVAL Left 03/16/2015  ? Procedure: LEFT URETERAL STENT REMOVAL;  Surgeon: Franchot Gallo, MD;  [facility];  Service: Urology;  Laterality: Left;  ? THYROID EXPLORATION N/A 07/31/2013  ? Procedure: neck EXPLORATION;  Surgeon: Odis Hollingshead, MD;  [facility];  Service: General;  Laterality: N/A;  ? THYROIDECTOMY N/A 07/31/2013  ? Procedure: PARATHYROIDECTOMY;  Surgeon: Odis Hollingshead, MD;  [facility];  Service:  General;  Laterality: N/A;  ? TUMOR REMOVAL  06/1997  ? Beign; on back x2 surgeries  ? TUMOR REMOVAL  2002  ? "FATTY TUMORS" REMOVED FROM BACK  ? URETEROSCOPY WITH HOLMIUM LASER LITHOTRIPSY Left 03/16/2015  ? Procedure: LEFT URETEROSCOPIC LASER LITHOTRIPSY WITH STONE EXTRACTION;  Surgeon: Franchot Gallo, MD;  [facility];  Service: Urology;  Laterality: Left;  ? ? ?Home Medications:  ?(Not in a hospital admission) ? ? ?Allergies:  ?Allergies  ?Allergen Reactions  ? Ace Inhibitors Other (See Comments)  ?  Patient doesn't remember this allergy.  On 12/30/2014 received LOV visit from PCP , Dr Rory Percy in [place], [place].  - No known active drug allergies listed in his office visit note dated 05/21/2014.    ? Venlafaxine Anxiety  ? ? ?Family History  ?Problem Relation Age of Onset  ? Hypertension Mother   ? Arthritis Mother   ? Hypertension Father   ? Healthy Brother   ? Healthy Daughter   ? Healthy Daughter   ? Colon cancer Neg Hx   ? ? ?Social History:  reports that she has never smoked. She has never used smokeless tobacco. She reports current alcohol use. She reports that she does not use drugs. ? ?ROS: ?A complete review of systems was performed.  All systems are negative except for pertinent findings as noted. ? ?Physical Exam:  ?Vital signs in last 24 hours: ?'@VSRANGES'$ @ ?General:  Alert and oriented, No acute distress ?HEENT: Normocephalic, atraumatic ?Neck: No JVD or lymphadenopathy ?Cardiovascular: Regular rate  ?Lungs: Normal inspiratory/expiratory excursion ?Extremities: No edema ?Neurologic: Grossly intact ? ?I have reviewed prior pt notes ? ?I have reviewed urinalysis results ? ?I have independently reviewed prior imaging--KUB from 11/16/2021 reviewed, L-spine films from 01/24/2022 reviewed.  2 calculi overlying the right renal contour are in similar location on both films.  Stable left renal calculi. ? ? ?Impression/Assessment:  ?1.  Bilateral renal calculi, currently asymptomatic, on surveillance ? ?2.   Overactive bladder, patient taught behavioral modification for help ? ?3.  Back pain, unlikely of GU source ? ?Plan:  ?1.  Reassured patient that her pain is most likely from nonurologic issues ? ?2.  I will have her come back for scheduled visit in March of next year ? ?[name] Dula Havlik ?01/31/2022, 6:59 AM  ?[name]. Carla Whilden MD ? ? ?

## 2022-02-01 LAB — URINALYSIS, ROUTINE W REFLEX MICROSCOPIC
Bilirubin, UA: NEGATIVE
Glucose, UA: NEGATIVE
Ketones, UA: NEGATIVE
Nitrite, UA: NEGATIVE
Protein,UA: NEGATIVE
RBC, UA: NEGATIVE
Specific Gravity, UA: 1.02 (ref 1.005–1.030)
Urobilinogen, Ur: 1 mg/dL (ref 0.2–1.0)
pH, UA: 7 (ref 5.0–7.5)

## 2022-02-01 LAB — MICROSCOPIC EXAMINATION
Bacteria, UA: NONE SEEN
Epithelial Cells (non renal): >10 /hpf — AB (ref 0–10)
RBC, Urine: NONE SEEN /hpf (ref 0–2)
Renal Epithel, UA: NONE SEEN /hpf

## 2022-02-08 ENCOUNTER — Other Ambulatory Visit: Payer: Self-pay | Admitting: Internal Medicine

## 2022-02-08 ENCOUNTER — Other Ambulatory Visit (HOSPITAL_COMMUNITY): Payer: Self-pay | Admitting: Internal Medicine

## 2022-02-08 DIAGNOSIS — M549 Dorsalgia, unspecified: Secondary | ICD-10-CM

## 2022-02-09 ENCOUNTER — Other Ambulatory Visit: Payer: Self-pay | Admitting: Internal Medicine

## 2022-02-09 ENCOUNTER — Other Ambulatory Visit (HOSPITAL_COMMUNITY): Payer: Self-pay | Admitting: Internal Medicine

## 2022-02-09 DIAGNOSIS — G8929 Other chronic pain: Secondary | ICD-10-CM

## 2022-02-18 ENCOUNTER — Ambulatory Visit (HOSPITAL_COMMUNITY)
Admission: RE | Admit: 2022-02-18 | Discharge: 2022-02-18 | Disposition: A | Discharge status: Home / Self Care | Payer: Medicare Other | Source: Ambulatory Visit | Attending: Internal Medicine | Admitting: Internal Medicine

## 2022-02-18 DIAGNOSIS — M549 Dorsalgia, unspecified: Secondary | ICD-10-CM

## 2022-02-18 IMAGING — MR MR LUMBAR SPINE W/O CM
5 series · 30 of 48 positions shown · non-contrast
Comparison: CT lumbar spine [DATE], lumbar spine radiographs
[DATE]

CLINICAL DATA: Recent severe right-sided back pain.

EXAM:
MRI LUMBAR SPINE WITHOUT CONTRAST
TECHNIQUE: Multiplanar, multisequence MR imaging of the lumbar spine was
performed. No intravenous contrast was administered.

[Series 5: T1 · sagittal · 4.0mm · 0.81mm/px · 6 of 20 slices shown (1 of 2)]
[im 1/20]
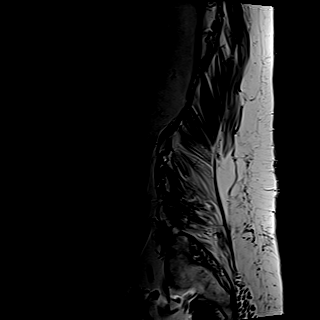
[im 4/20]
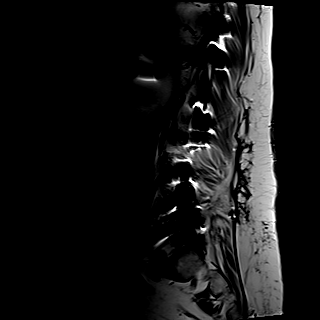
[im 8/20]
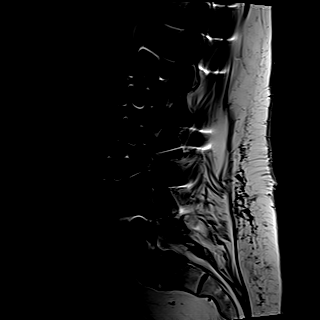
[im 12/20]
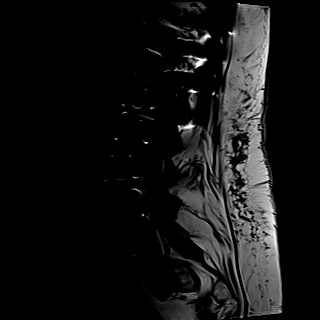
[im 16/20]
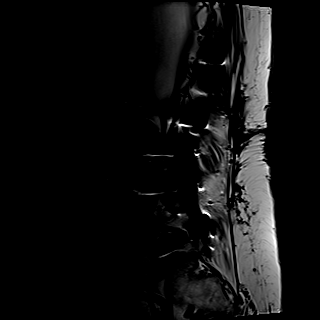
[im 20/20]
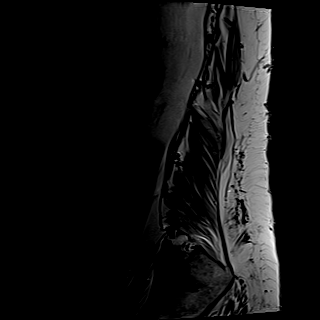

[Series 6: T2 · sagittal · 4.0mm · 0.81mm/px · 7 of 20 slices shown (1 of 2)]
[im 1/20]
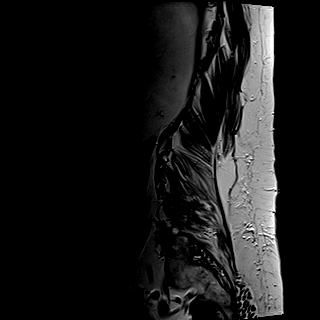
[im 4/20]
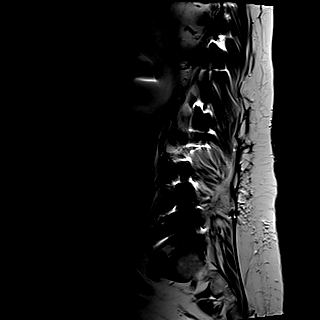
[im 7/20]
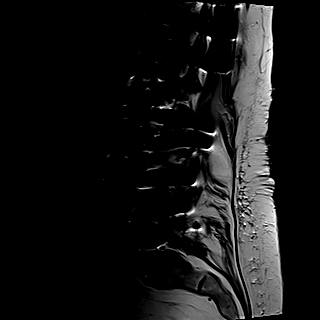
[im 10/20]
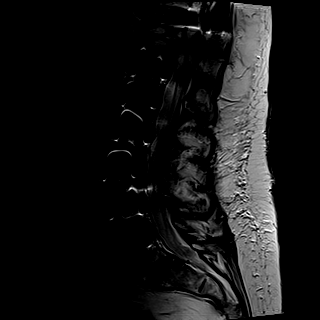
[im 13/20]
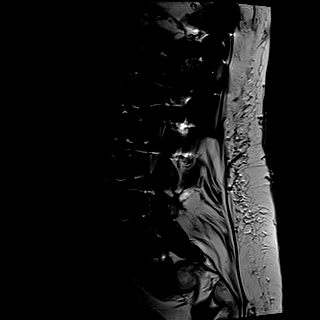
[im 16/20]
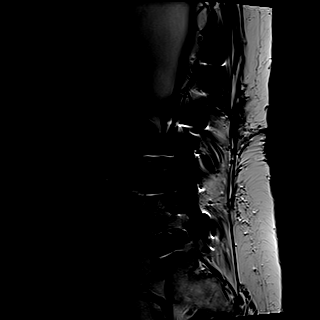
[im 20/20]
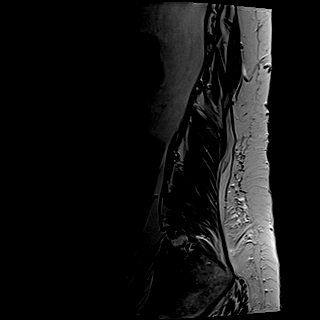

[Series 7: STIR · sagittal · 4.0mm · 0.51mm/px · 1 of 20 slices shown]
[im 1/20]
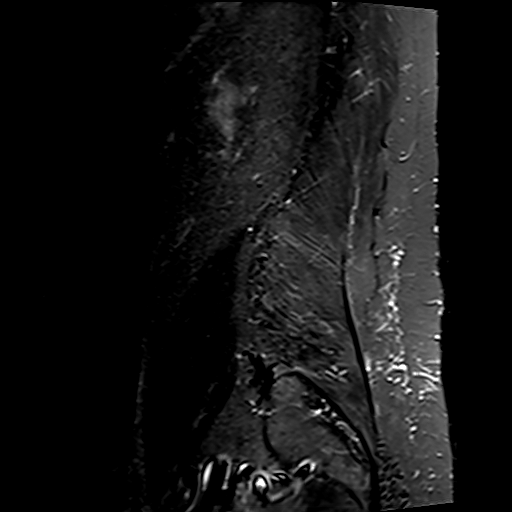

[Series 8: T2 · axial · 4.0mm · 0.62mm/px · z∈[-424,-231]mm · 8 of 40 slices shown (2 of 2)]
[im 1/40]
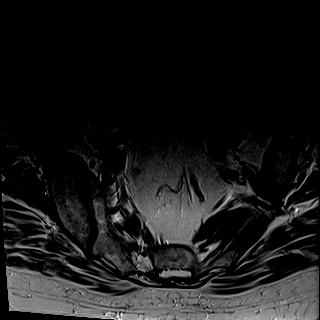
[im 7/40]
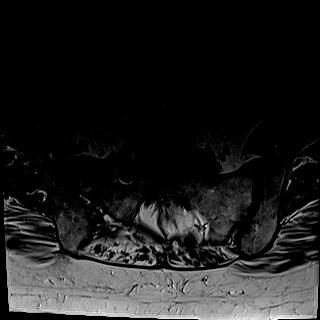
[im 13/40]
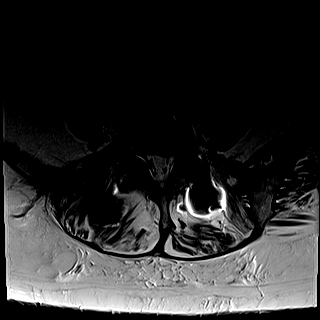
[im 19/40]
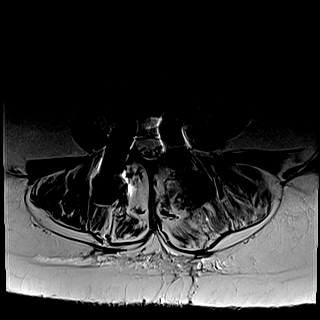
[im 22/40]
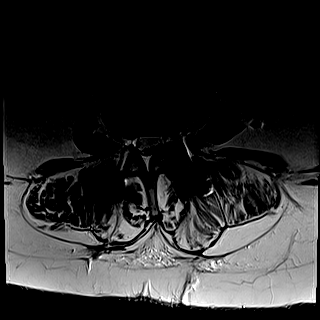
[im 28/40]
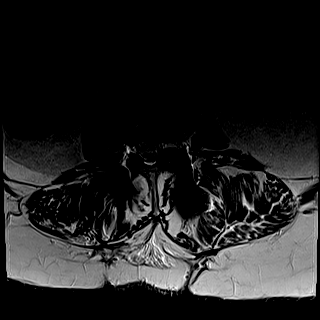
[im 34/40]
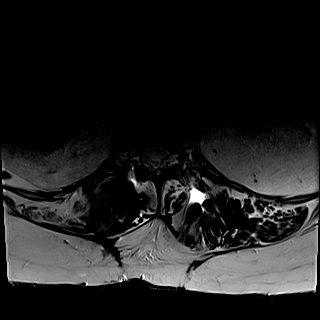
[im 40/40]
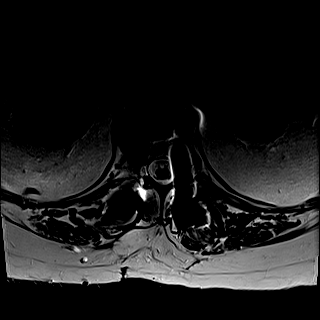

[Series 9: T1 · axial · 4.0mm · 0.39mm/px · z∈[-424,-231]mm · 8 of 40 slices shown (2 of 2)]
[im 1/40]
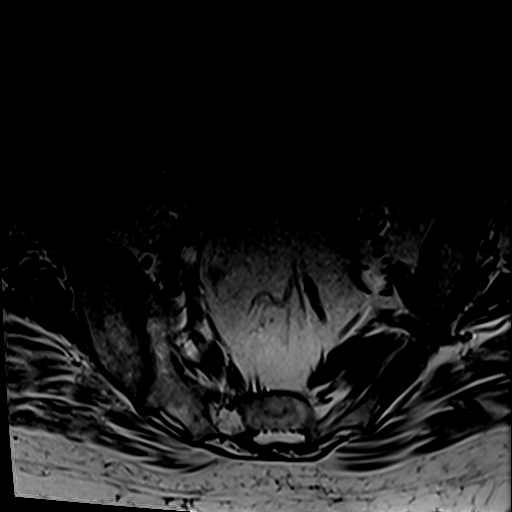
[im 7/40]
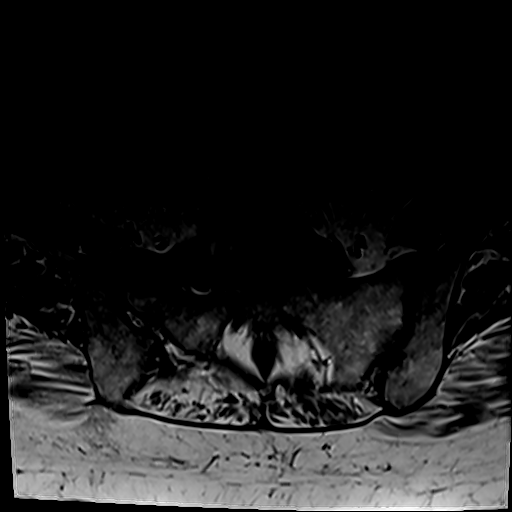
[im 13/40]
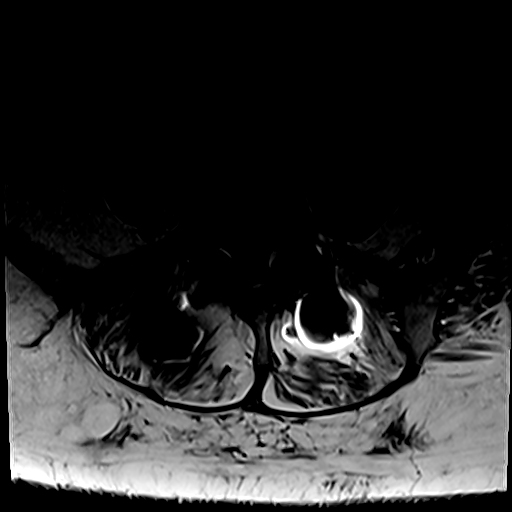
[im 19/40]
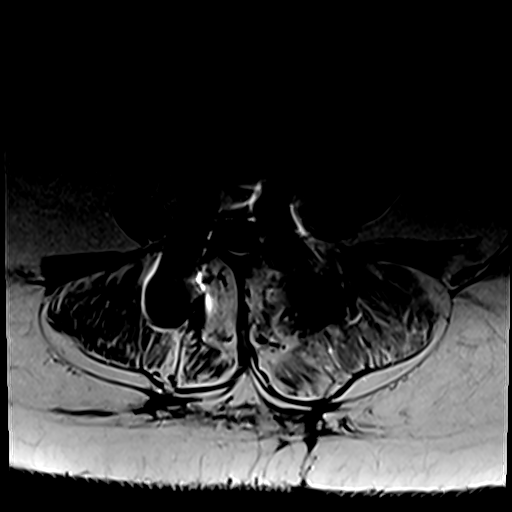
[im 22/40]
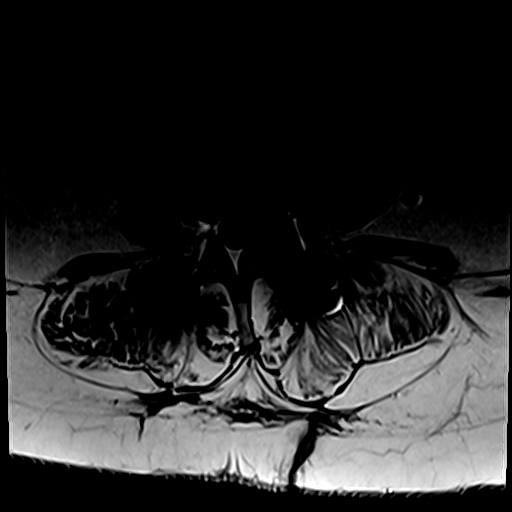
[im 28/40]
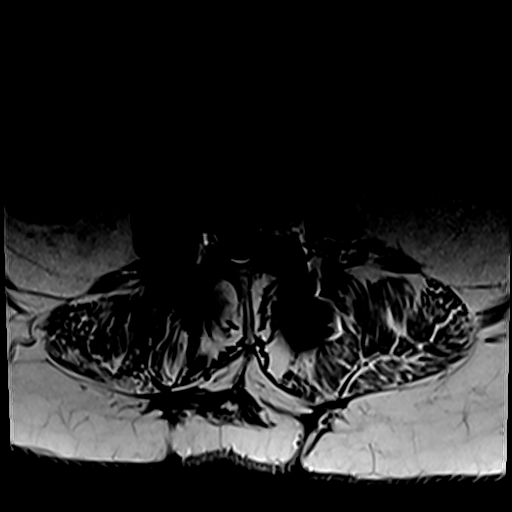
[im 34/40]
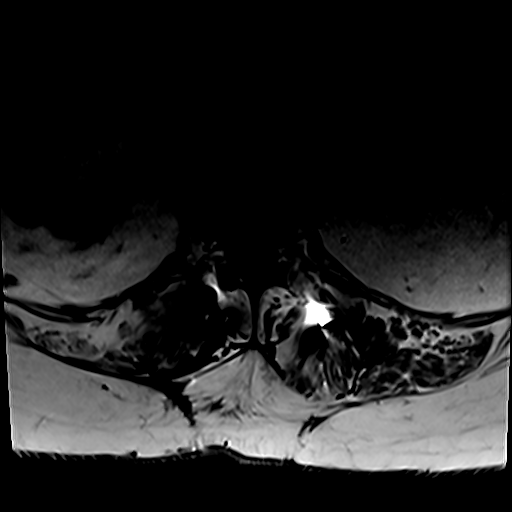
[im 40/40]
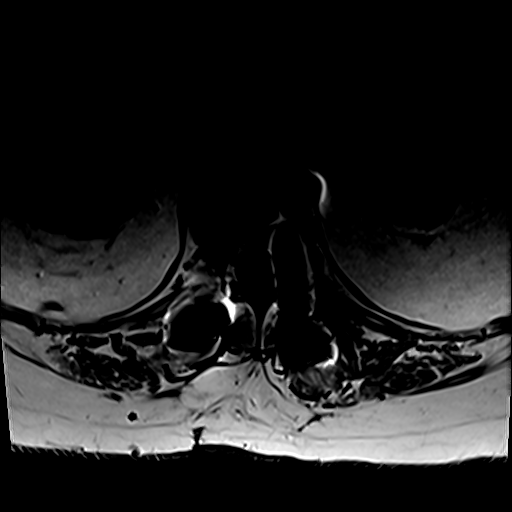

[30 of 48 positions shown; findings below may reference images not displayed]

FINDINGS: Segmentation:  Standard.

Alignment: No definite spondylolisthesis. Mild dextrocurvature
centered at L1-2, as seen on prior CT.

Vertebrae: There is extensive posterior rod and screw fusion
hardware throughout the visualized lower thoracic and lumbar spine,
extending from the visualized T10 through the L5 levels. This
includes right T12 and left L4, and otherwise bilateral
transpedicular rod and screw fusion hardware at each level. There
are intervertebral disc spacers at the T11-12 through L4-5 levels.
Vertebral body heights are grossly maintained although metallic
artifact limits evaluation. No gross acute fracture or destructive
bone lesion. Moderate anterior right T11-12 chronic fat intensity
marrow endplate degenerative changes region of anterior right T11-12
severe disc space narrowing. Probable L3-4 chronic fat intensity
marrow endplate degenerative change.

Conus medullaris and cauda equina: Conus extends to the inferior T12
level. Conus and cauda equina appear normal.

Paraspinal and other soft tissues: Tiny decreased T1 increased T2
signal lateral left midpole renal cyst.

Disc levels:

L1-2: Status post discectomy quadrant disc space fusion, and
bilateral transpedicular rod and screw fusion. Within limitations of
metallic artifact, there is again mild left posterior disc
osteophyte complex with mild-to-moderate left intraforaminal
extension. Mild left neuroforaminal narrowing. No central canal
stenosis.

L2-3: Moderate bilateral facet joint hypertrophy. Status post
discectomy disc fusion, and bilateral transpedicular rod and screw
fusion. No central canal or neuroforaminal stenosis is seen.

L3-4: Status post discectomy, disc fusion, and bilateral
transpedicular rod and screw fusion. Minimal posterior disc
osteophyte complex. No central canal or neuroforaminal stenosis is
seen.

L4-5: Status post discectomy disc space fusion, and left L4 and
bilateral L5 transpedicular rod and screw fusion. Note is made of
mild lucency around the right L5 screw on the prior CT. No gross
abnormality is seen within limitations of metallic artifact on the
current MRI. Mild posterior disc osteophyte complex with
mild-to-moderate right-greater-than-left intraforaminal extension.
Mild-to-moderate right neuroforaminal stenosis is likely seen,
similar to prior CT. No central canal stenosis.

L5-S1: Minimal posterior disc bulge. Mild bilateral facet joint
hypertrophy. Likely mild left neuroforaminal narrowing. No central
canal stenosis.
IMPRESSION: 1. Status post T9 through L5 posterior instrumented fusion with
T11-12 through L4-5 intervertebral disc spacers.
2. Mild left L1-2 and mild-to-moderate right L4-5 neuroforaminal
stenosis, similar to prior CT.

## 2022-02-18 IMAGING — MR MR THORACIC SPINE W/O CM
6 series · 42 of 48 positions shown · non-contrast
Comparison: CT thoracic spine [DATE]; chest two views
[DATE]; thoracic spine radiographs [DATE]

CLINICAL DATA: Recent severe right-sided back pain.

EXAM:
MRI THORACIC SPINE WITHOUT CONTRAST
TECHNIQUE: Multiplanar, multisequence MR imaging of the thoracic spine was
performed. No intravenous contrast was administered.

[Series 16: T1 · sagittal · 4.0mm · 1.72mm/px · 1 of 5 slices shown (1 of 2)]
[im 1/5]
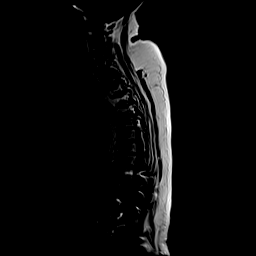

[Series 17: STIR · sagittal · 3.0mm · 1.00mm/px · 5 of 20 slices shown]
[im 1/20]
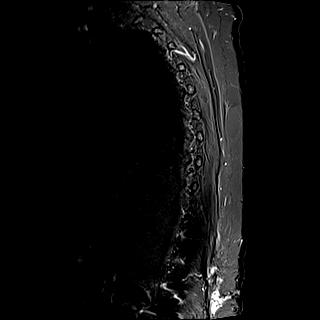
[im 5/20]
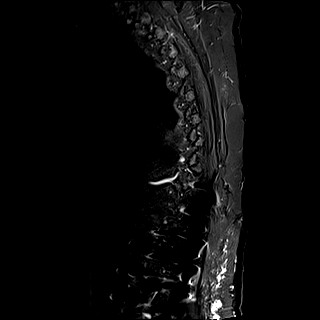
[im 10/20]
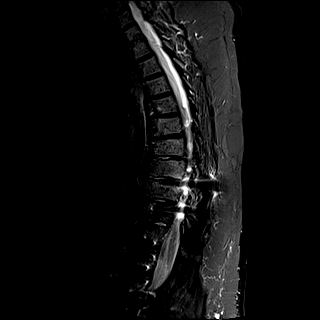
[im 15/20]
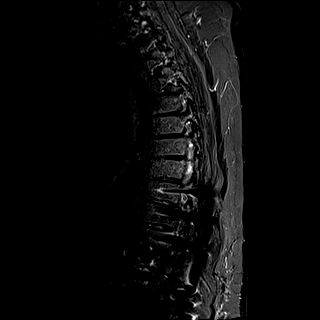
[im 20/20]
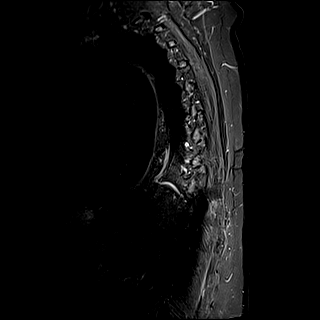

[Series 18: T1 · sagittal · 3.0mm · 1.00mm/px · 6 of 20 slices shown (2 of 2)]
[im 1/20]
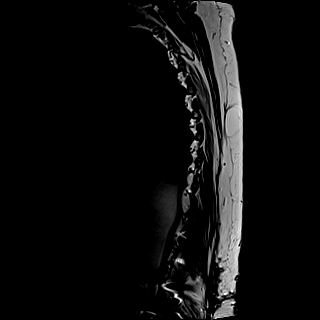
[im 4/20]
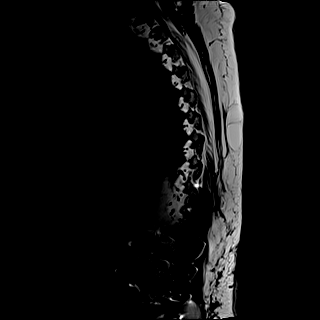
[im 8/20]
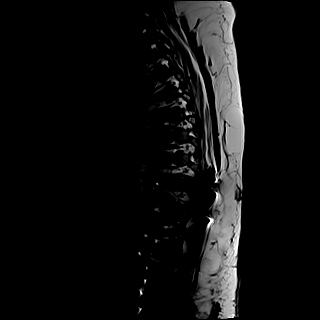
[im 12/20]
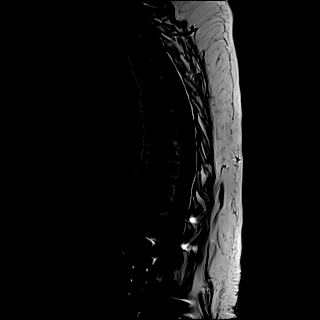
[im 16/20]
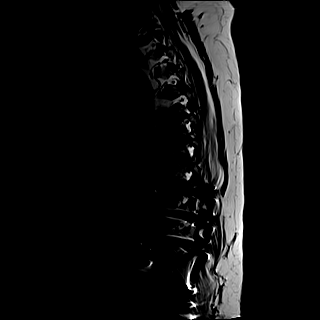
[im 20/20]
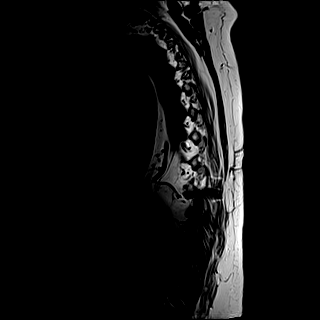

[Series 19: T2 · sagittal · 3.0mm · 0.83mm/px · 6 of 20 slices shown (1 of 2)]
[im 1/20]
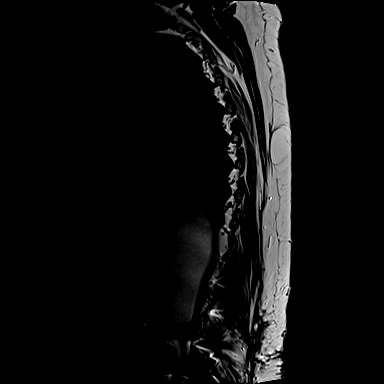
[im 4/20]
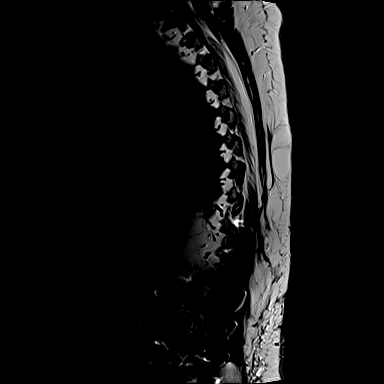
[im 8/20]
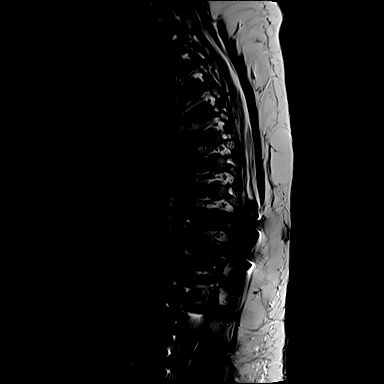
[im 12/20]
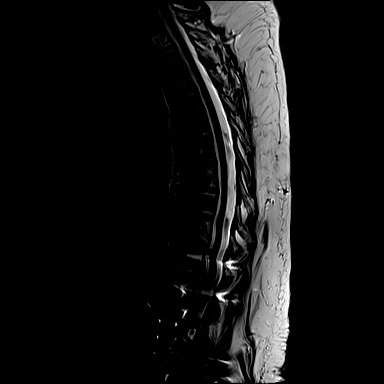
[im 16/20]
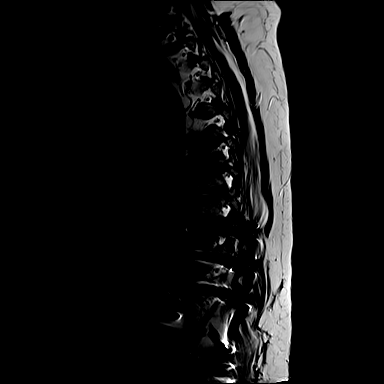
[im 20/20]
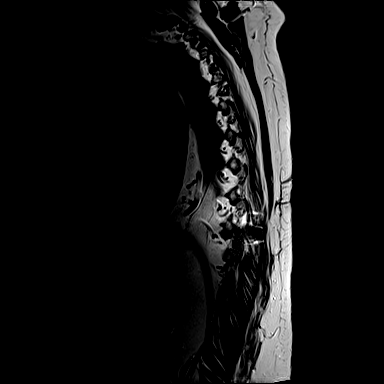

[Series 20: T2 · axial · 4.0mm · 0.78mm/px · z∈[-303,-73]mm · 15 of 50 slices shown (2 of 2)]
[im 1/50]
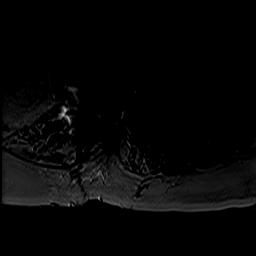
[im 4/50]
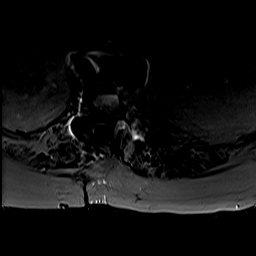
[im 8/50]
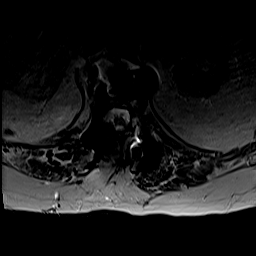
[im 11/50]
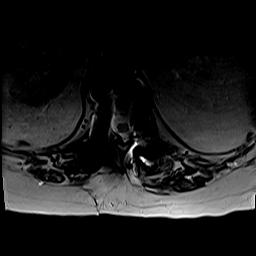
[im 15/50]
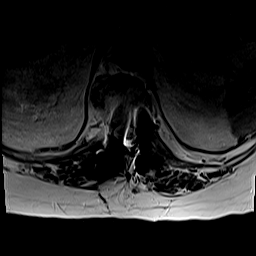
[im 18/50]
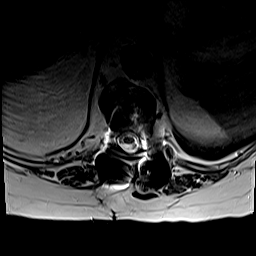
[im 22/50]
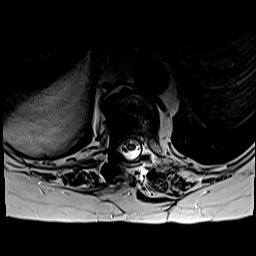
[im 25/50]
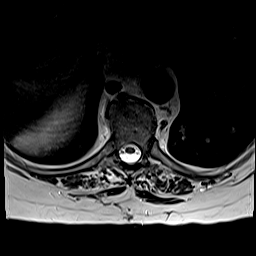
[im 29/50]
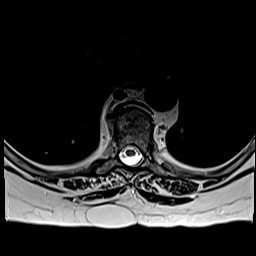
[im 32/50]
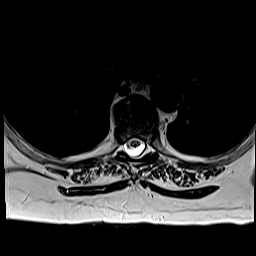
[im 36/50]
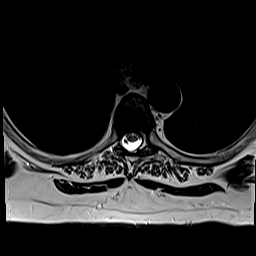
[im 39/50]
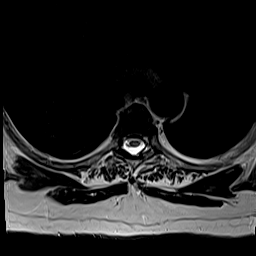
[im 43/50]
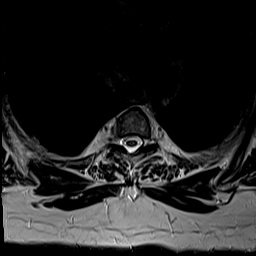
[im 46/50]
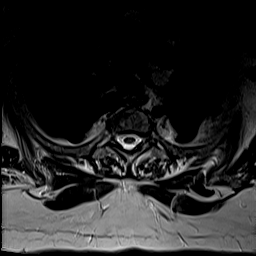
[im 50/50]
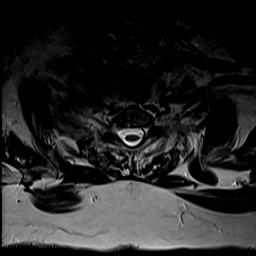

[Series 21: t2_me2d_tra · axial · 4.0mm · 0.52mm/px · z∈[-303,-73]mm · 9 of 50 slices shown]
[im 1/50]
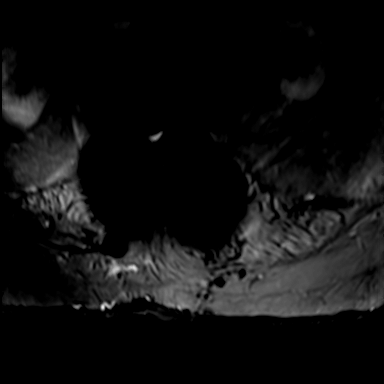
[im 4/50]
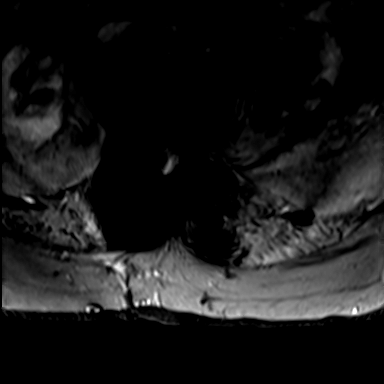
[im 8/50]
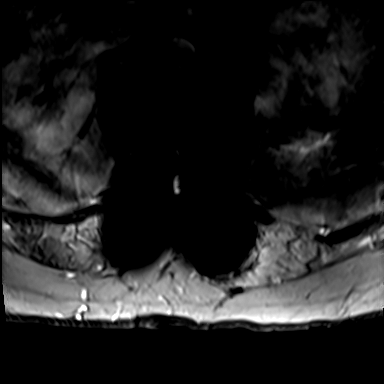
[im 15/50]
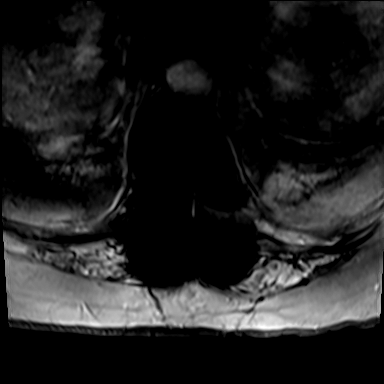
[im 22/50]
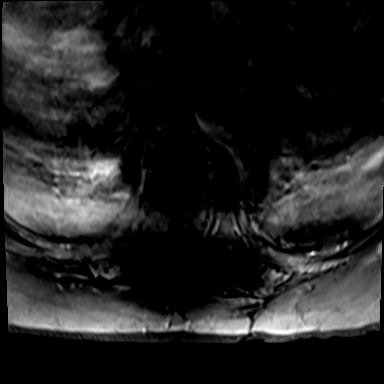
[im 29/50]
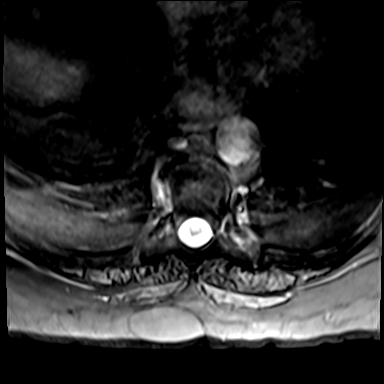
[im 36/50]
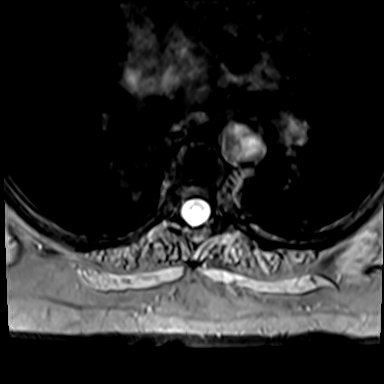
[im 43/50]
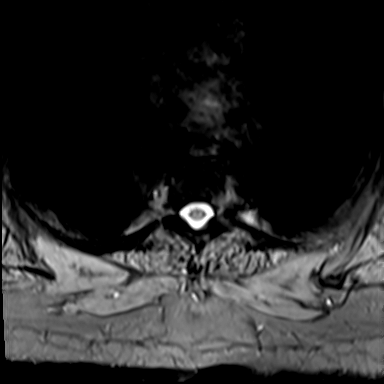
[im 50/50]
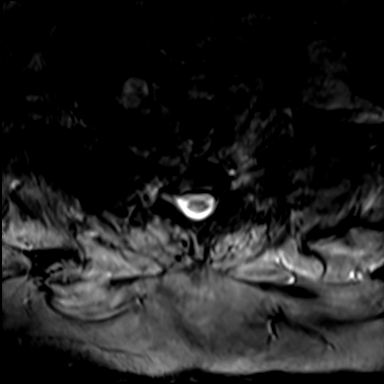

[42 of 48 positions shown; findings below may reference images not displayed]

FINDINGS: Alignment: There are 12 rib-bearing thoracic type vertebral bodies
as seen on prior radiographs and CT. No sagittal spondylolisthesis.
Mild levocurvature centered at T10, unchanged from prior.

Vertebrae: Vertebral body heights are maintained. Severe T1-2 disc
space narrowing with portions of bone-on-bone contact. There is
moderate increased T1 signal within the mid T1 vertebral body
related to a moderate-to-large fat intensity hemangioma measuring up
to 12 mm in transverse dimension and spanning the entire height of
the T1 vertebral body. There is also moderate T1-T2 endplate
degenerative Schmorl's nodes and chronic fat intensity marrow
endplate degenerative change. Small posterosuperior T7 vertebral
body fat intensity hemangioma.

Mild-to-moderate anterior T5-6, T7-8, and T8-9, and mild anterior
T6-7 and T9-10 disc space narrowing. Mild T5-6 edematous and chronic
fat intensity marrow endplate degenerative changes. Mild anterior
right C6-7 marrow fat intensity degenerative change. No acute
fracture.

Cord:  The thoracic cord demonstrates normal signal and caliber.

Paraspinal and other soft tissues: No paraspinal fluid collection.

Disc levels:

C7-T1: Minimal posterior disc bulge. No significant central canal or
neuroforaminal stenosis.

T2-3: Mild bilateral facet joint hypertrophy and mild posterior disc
bulge with mild bilateral neuroforaminal narrowing.

T6-7: Minimal posterior disc bulge without central canal or
neuroforaminal stenosis.

T7-8: Minimal posterior disc bulge without central canal or
neuroforaminal stenosis.

T9-10: Status post bilateral transpedicular rod and screw fusion. No
posterior disc bulge central canal narrowing or neuroforaminal
stenosis, within the limitations of the metallic artifact.

T10-11: Status post bilateral transpedicular rod and screw fusion.
Minimal posterior disc bulge. No stenosis is seen within the
limitations of the metallic artifact.

T11-12: Status post bilateral T11 and right T12 transpedicular rod
and screw fusion hardware. T11-12 intervertebral disc spacer. No
stenosis is seen.

T12-L1: Status post right T12 and bilateral L1 transpedicular rod
and screw fusion hardware with associated intervertebral disc
spacer. No definite neuroforaminal stenosis or central canal
stenosis within limitations of the metallic artifact.
IMPRESSION: 1. Status post T9-10 through the lumbar spine posterior fusion
hardware partially visualized. No central canal or neuroforaminal
stenosis is seen at these levels.
2. T2-3 mild posterior disc bulge. Mild bilateral T2-3
neuroforaminal narrowing.
3. T1-T2 severe degenerative disc changes.

## 2022-02-22 ENCOUNTER — Other Ambulatory Visit: Payer: Self-pay | Admitting: Urology

## 2022-02-22 DIAGNOSIS — N200 Calculus of kidney: Secondary | ICD-10-CM

## 2022-03-23 ENCOUNTER — Other Ambulatory Visit: Payer: Self-pay | Admitting: Orthopaedic Surgery

## 2022-03-23 ENCOUNTER — Other Ambulatory Visit (HOSPITAL_COMMUNITY): Payer: Self-pay | Admitting: Orthopaedic Surgery

## 2022-03-23 DIAGNOSIS — Z01818 Encounter for other preprocedural examination: Secondary | ICD-10-CM

## 2022-03-24 ENCOUNTER — Telehealth: Payer: Self-pay | Admitting: Cardiology

## 2022-03-24 NOTE — Telephone Encounter (Signed)
   Pre-operative Risk Assessment    Patient Name: Natasha Warren  DOB: 1946/04/03 MRN: [id number]      Request for Surgical Clearance    Procedure:   Right Total Shoulder Replacement  Date of Surgery:  Clearance TBD                                 Surgeon:  Ophelia Charter  Surgeon's Group or Practice Name:  [facility]  Phone number:  [phone] Fax number:  [phone]   Type of Clearance Requested:   - Medical  - Pharmacy:  Hold 91 mg ASA  ?   Type of Anesthesia:  General    Additional requests/questions:    [name]   03/24/2022, 11:57 AM

## 2022-03-24 NOTE — Telephone Encounter (Signed)
Tried to call the pt to set up tele visit for pre op, though recording cam on and said this person is not available at this time.

## 2022-03-24 NOTE — Telephone Encounter (Signed)
   Name: Natasha Warren  DOB: [birth year]  MRN: [id number]  Primary Cardiologist: Natasha Dolly, MD   Preoperative team, please contact this patient and set up a phone call appointment for further preoperative risk assessment. Please obtain consent and complete medication review. Thank you for your help.  I confirm that guidance regarding antiplatelet and oral anticoagulation therapy has been completed and, if necessary, noted below.  Patient takes aspirin 81 mg daily, however, it appears this is not specifically for cardiac reasons. Will need to clarify with patient.  Lenna Sciara, NP 03/24/2022, 4:34 PM [facility] [address] [place],  [address]

## 2022-03-28 NOTE — Telephone Encounter (Signed)
Tried to reach the pt again today, recording states person at this number is not accepting calls right now, try again later or try texting the person. I will send FYI to requesting office in the hopes that if they s/w the pt they may let her know she needs to call her cardiology office for an appt for pre op clearance.

## 2022-03-29 NOTE — Telephone Encounter (Signed)
Patient returning call. Phone: [phone]

## 2022-03-30 ENCOUNTER — Telehealth: Payer: Self-pay | Admitting: *Deleted

## 2022-03-30 NOTE — Telephone Encounter (Signed)
I was able to reach the pt today and set up a tele visit for pre op clearance. Pt request her preferred # for [place] to call is her home # [phone]. Med rec and consent are done.

## 2022-03-30 NOTE — Telephone Encounter (Signed)
I was able to reach the pt today and set up a tele visit for pre op clearance. Pt request her preferred # for [place] to call is her home # [phone]. Med rec and consent are done.     Patient Consent for Virtual Visit        Natasha Warren has provided verbal consent on 03/30/2022 for a virtual visit (video or telephone).   CONSENT FOR VIRTUAL VISIT FOR:  Natasha Warren  By participating in this virtual visit I agree to the following:  I hereby voluntarily request, consent and authorize [facility] and its employed or contracted physicians, physician assistants, nurse practitioners or other licensed health care professionals (the Practitioner), to provide me with telemedicine health care services (the "Services") as deemed necessary by the treating Practitioner. I acknowledge and consent to receive the Services by the Practitioner via telemedicine. I understand that the telemedicine visit will involve communicating with the Practitioner through live audiovisual communication technology and the disclosure of certain medical information by electronic transmission. I acknowledge that I have been given the opportunity to request an in-person assessment or other available alternative prior to the telemedicine visit and am voluntarily participating in the telemedicine visit.  I understand that I have the right to withhold or withdraw my consent to the use of telemedicine in the course of my care at any time, without affecting my right to future care or treatment, and that the Practitioner or I may terminate the telemedicine visit at any time. I understand that I have the right to inspect all information obtained and/or recorded in the course of the telemedicine visit and may receive copies of available information for a reasonable fee.  I understand that some of the potential risks of receiving the Services via telemedicine include:  Delay or interruption in medical evaluation due to technological  equipment failure or disruption; Information transmitted may not be sufficient (e.g. poor resolution of images) to allow for appropriate medical decision making by the Practitioner; and/or  In rare instances, security protocols could fail, causing a breach of personal health information.  Furthermore, I acknowledge that it is my responsibility to provide information about my medical history, conditions and care that is complete and accurate to the best of my ability. I acknowledge that Practitioner's advice, recommendations, and/or decision may be based on factors not within their control, such as incomplete or inaccurate data provided by me or distortions of diagnostic images or specimens that may result from electronic transmissions. I understand that the practice of medicine is not an exact science and that Practitioner makes no warranties or guarantees regarding treatment outcomes. I acknowledge that a copy of this consent can be made available to me via my patient portal ([facility]), or I can request a printed copy by calling the office of [facility].    I understand that my insurance will be billed for this visit.   I have read or had this consent read to me. I understand the contents of this consent, which adequately explains the benefits and risks of the Services being provided via telemedicine.  I have been provided ample opportunity to ask questions regarding this consent and the Services and have had my questions answered to my satisfaction. I give my informed consent for the services to be provided through the use of telemedicine in my medical care

## 2022-03-31 ENCOUNTER — Ambulatory Visit (HOSPITAL_COMMUNITY)
Admission: RE | Admit: 2022-03-31 | Discharge: 2022-03-31 | Disposition: A | Discharge status: Home / Self Care | Payer: Medicare Other | Source: Ambulatory Visit | Attending: Orthopaedic Surgery | Admitting: Orthopaedic Surgery

## 2022-03-31 DIAGNOSIS — Z01818 Encounter for other preprocedural examination: Secondary | ICD-10-CM | POA: Diagnosis not present

## 2022-04-04 ENCOUNTER — Ambulatory Visit (INDEPENDENT_AMBULATORY_CARE_PROVIDER_SITE_OTHER): Payer: Medicare Other | Admitting: Physician Assistant

## 2022-04-04 DIAGNOSIS — Z01810 Encounter for preprocedural cardiovascular examination: Secondary | ICD-10-CM

## 2022-04-04 NOTE — Progress Notes (Signed)
Virtual Visit via Telephone Note   Because of Natasha Warren co-morbid illnesses, she is at least at moderate risk for complications without adequate follow up.  This format is felt to be most appropriate for this patient at this time.  The patient did not have access to video technology/had technical difficulties with video requiring transitioning to audio format only (telephone).  All issues noted in this document were discussed and addressed.  No physical exam could be performed with this format.  Please refer to the patient's chart for her consent to telehealth for [facility].  Evaluation Performed:  Preoperative cardiovascular risk assessment _____________   Date:  04/04/2022   Patient ID:  Natasha Warren, DOB 01-11-1946, MRN [id number] Patient Location:  Home Provider location:   Office  Primary Care Provider:  Bonnita Hollow, MD Primary Cardiologist:  Carlyle Dolly, MD  Chief Complaint / Patient Profile   76 y.o. y/o female with a h/o aortic stenosis, PSVT, HTN and HLD who is pending Right Total Shoulder Replacement and presents today for telephonic preoperative cardiovascular risk assessment.  Past Medical History    Past Medical History:  Diagnosis Date   Arthritis    Bulging lumbar disc    Colon polyps    Diabetes mellitus without complication ([place])    History of gout    History of gout    Hyperlipidemia    Hyperparathyroidism, primary ([place])    Hypertension    Hypothyroidism    Irritable bowel syndrome    Nocturia    Renal disorder    kidney stones   Seasonal allergies    SVT (supraventricular tachycardia) ([place])    Thyroid disorder    Urolithiasis    Past Surgical History:  Procedure Laterality Date   [place] SURGERY  09/2016   bladder tack  09/1984   CHOLECYSTECTOMY  01/1999   COLONOSCOPY  06/2006   This was her second one   COLONOSCOPY  07/12/2011   Procedure: COLONOSCOPY;  Surgeon:  Rogene Houston, MD;  [facility];  Service: Endoscopy;  Laterality: N/A;  10:30 am   COLONOSCOPY N/A 04/03/2018   Procedure: COLONOSCOPY;  Surgeon: Rogene Houston, MD;  Location[facility];  Service: Endoscopy;  Laterality: N/A;  [place] Left 10/03/2014   Procedure: CYSTOSCOPY WITH URETERAL STENT PLACEMENT;  Surgeon: Malka So, MD;  Location: [facility];  Service: Urology;  Laterality: Left;   CYSTOSCOPY WITH URETEROSCOPY AND STENT PLACEMENT Left 03/16/2015   Procedure: CYSTOSCOPY WITH LEFT STENT PLACEMENT;  Surgeon: Franchot Gallo, MD;  Location: [facility];  Service: Urology;  Laterality: Left;   gout removal  2017   HOLMIUM LASER APPLICATION Left 12/09/4008   Procedure: HOLMIUM LASER APPLICATION;  Surgeon: Franchot Gallo, MD;  [facility];  Service: Urology;  Laterality: Left;   KNEE ARTHROSCOPY  12/2010   Left knee   LITHOTRIPSY  06/1995   LUMBAR FUSION  06/2016   NEPHROLITHOTOMY Left 01/07/2015   Procedure: NEPHROLITHOTOMY PERCUTANEOUS;  Surgeon: Franchot Gallo, MD;  [facility];  Service: Urology;  Laterality: Left;  With STENT   POLYPECTOMY  04/03/2018   Procedure: POLYPECTOMY;  Surgeon: Rogene Houston, MD;  [facility];  Service: Endoscopy;;  transverse,splenic flexure, sigmoid   Rotor Cuff  2011   Right Shoulder   STENT REMOVAL Left 03/16/2015   Procedure: LEFT URETERAL STENT REMOVAL;  Surgeon: Franchot Gallo, MD;  Location: [facility];  Service: Urology;  Laterality: Left;   THYROID EXPLORATION N/A 07/31/2013   Procedure: neck EXPLORATION;  Surgeon: Odis Hollingshead, MD;  Location[facility];  Service: General;  Laterality: N/A;   THYROIDECTOMY N/A 07/31/2013   Procedure: PARATHYROIDECTOMY;  Surgeon: Odis Hollingshead, MD;  Location[facility];  Service: General;  Laterality: N/A;   TUMOR REMOVAL  06/1997   Beign; on back x2 surgeries   TUMOR REMOVAL  2002   "FATTY TUMORS" REMOVED FROM BACK   URETEROSCOPY  WITH HOLMIUM LASER LITHOTRIPSY Left 03/16/2015   Procedure: LEFT URETEROSCOPIC LASER LITHOTRIPSY WITH STONE EXTRACTION;  Surgeon: Franchot Gallo, MD;  Location: [facility];  Service: Urology;  Laterality: Left;    Allergies  Allergies  Allergen Reactions   Ace Inhibitors Other (See Comments)    Patient doesn't remember this allergy.  On 12/30/2014 received LOV visit from PCP , Dr Rory Percy in [place], [place].  - No known active drug allergies listed in his office visit note dated 05/21/2014.     Venlafaxine Anxiety    History of Present Illness    Natasha Warren is a 76 y.o. female who presents via audio/video conferencing for a telehealth visit today.  Pt was last seen in cardiology clinic on 07/06/21 by Dr. Harl Bowie.  At that time Natasha Warren was doing well.  The patient is now pending procedure as outlined above. Since her last visit, she done well.  No strenuous activity but able to do household chores and routine activity without any problem.  She has chronic back pain as well.  She denies chest pain, shortness of breath, orthopnea, PND, syncope, lower extremity edema or melena.  Compliant with her medications.   Home Medications    Prior to Admission medications   Medication Sig Start Date End Date Taking? Authorizing Provider  acetaminophen (TYLENOL) 500 MG tablet Take 1,000 mg by mouth every 6 (six) hours as needed for moderate pain or headache.     [provider]  allopurinol (ZYLOPRIM) 300 MG tablet Take 300 mg by mouth daily.  05/14/16   [provider]  aspirin EC 81 MG tablet Take 81 mg by mouth daily.    [provider]  atorvastatin (LIPITOR) 20 MG tablet Take 20 mg by mouth daily.    [provider]  Cholecalciferol (VITAMIN D3) 1000 units CAPS Take 1,000 Units by mouth daily.    [provider]  docusate sodium (COLACE) 50 MG capsule Take 250 mg by mouth daily.  Patient not taking: Reported on 11/22/2021    [provider]   gabapentin (NEURONTIN) 300 MG capsule Take 300 mg by mouth at bedtime.    [provider]  levothyroxine (SYNTHROID, LEVOTHROID) 50 MCG tablet Take 50 mcg by mouth daily before breakfast.    [provider]  losartan (COZAAR) 100 MG tablet Take 100 mg by mouth daily.  03/17/16   [provider]  metFORMIN (GLUCOPHAGE) 500 MG tablet Take 500 mg by mouth daily. 02/25/20   [provider]  metFORMIN (GLUCOPHAGE-XR) 500 MG 24 hr tablet Take 500 mg by mouth daily with breakfast.  05/14/16   [provider]  metoprolol succinate (TOPROL-XL) 50 MG 24 hr tablet Take 1 & 1/2 tablets daily. 09/28/15   Arnoldo Lenis, MD  oxyCODONE-acetaminophen (PERCOCET/ROXICET) 5-325 MG tablet Take 1 tablet by mouth 2 (two) times a day.     [provider]  polyethylene glycol (  MIRALAX / GLYCOLAX) 17 g packet Take 17 g by mouth daily.    [provider]  Potassium Citrate 15 MEQ (1620 MG) TBCR TAKE 1 TABLET BY MOUTH THREE TIMES DAILY 02/28/22   Franchot Gallo, MD  tizanidine (ZANAFLEX) 2 MG capsule Take 2 mg by mouth 3 (three) times daily as needed for muscle spasms.     [provider]  tiZANidine (ZANAFLEX) 2 MG tablet Take by mouth. 01/24/22   [provider]  zinc gluconate 50 MG tablet Take 50 mg by mouth daily.    [provider]    Physical Exam    Vital Signs:  Natasha Warren does not have vital signs available for review today.  Given telephonic nature of communication, physical exam is limited. AAOx3. NAD. Normal affect.  Speech and respirations are unlabored.  Accessory Clinical Findings    None  Assessment & Plan    1.  Preoperative Cardiovascular Risk Assessment:    Patient Name: Natasha Warren  DOB: March 06, 1946 MRN: [id number]  Primary Cardiologist: Carlyle Dolly, MD  Chart reviewed as part of pre-operative protocol coverage. Given past medical history and time since last visit, based on ACC/AHA  guidelines, Natasha Warren would be at acceptable risk for the planned procedure without further cardiovascular testing.   She does not have history of CAD.  She can hold aspirin 7 days prior if needed.  The patient was advised that if she develops new symptoms prior to surgery to contact our office to arrange for a follow-up visit, and she verbalized understanding.  I will route this recommendation to the requesting party via Epic fax function and remove from pre-op pool.  Please call with questions.  A copy of this note will be routed to requesting surgeon.  Time:   Today, I have spent 8 minutes with the patient with telehealth technology discussing medical history, symptoms, and management plan.     [place], [place]  04/04/2022, 2:40 PM

## 2022-04-19 ENCOUNTER — Other Ambulatory Visit: Payer: Medicare Other

## 2022-05-01 ENCOUNTER — Encounter: Payer: Self-pay | Admitting: Urology

## 2022-05-01 NOTE — Progress Notes (Addendum)
COVID Vaccine Completed:  Date of COVID positive in last 90 days:  PCP - Josephine Igo, MD Cardiologist - Carlyle Dolly, MD  Cardiac clearance by Leanor Kail, PA  04/04/22 in Epic   Chest x-ray -  EKG - 07/06/21 Epic Stress Test -  ECHO - 05/09/21 Epic Cardiac Cath -  Pacemaker/ICD device last checked: Spinal Cord Stimulator:  Bowel Prep -   Sleep Study -  CPAP -   Fasting Blood Sugar -  Checks Blood Sugar _____ times a day  Blood Thinner Instructions: Aspirin Instructions: ASA 81, hold 7 days Last Dose:  Activity level:  Can go up a flight of stairs and perform activities of daily living without stopping and without symptoms of chest pain or shortness of breath.  Able to exercise without symptoms  Unable to go up a flight of stairs without symptoms of     Anesthesia review:   Patient denies shortness of breath, fever, cough and chest pain at PAT appointment  Patient verbalized understanding of instructions that were given to them at the PAT appointment. Patient was also instructed that they will need to review over the PAT instructions again at home before surgery.

## 2022-05-01 NOTE — Patient Instructions (Signed)
SURGICAL WAITING ROOM VISITATION Patients having surgery or a procedure may have no more than 2 support people in the waiting area - these visitors may rotate.   Children under the age of 57 must have an adult with them who is not the patient. If the patient needs to stay at the hospital during part of their recovery, the visitor guidelines for inpatient rooms apply. Pre-op nurse will coordinate an appropriate time for 1 support person to accompany patient in pre-op.  This support person may not rotate.    Please refer to the [facility] website for the visitor guidelines for Inpatients (after your surgery is over and you are in a regular room).     Your procedure is scheduled on: 05/10/22   Report [facility] Main [facility]    Report to admitting at 10:00 AM   Call this number if you have problems the morning of surgery [phone]   Do not eat food :After Midnight.   After Midnight you may have the following liquids until 9:30 AM DAY OF SURGERY  Water Non-Citrus Juices (without pulp, NO RED) Carbonated Beverages Black Coffee (NO MILK/CREAM OR CREAMERS, sugar ok)  Clear Tea (NO MILK/CREAM OR CREAMERS, sugar ok) regular and decaf                             Plain Jell-O (NO RED)                                           Fruit ices (not with fruit pulp, NO RED)                                     Popsicles (NO RED)                                                               Sports drinks like Gatorade (NO RED)     The day of surgery:  Drink ONE (1) Pre-Surgery G2 at 9:30 AM the morning of surgery. Drink in one sitting. Do not sip.  This drink was given to you during your hospital  pre-op appointment visit. Nothing else to drink after completing the  Pre-Surgery G2.          If you have questions, please contact your surgeon's office.   FOLLOW BOWEL PREP AND ANY ADDITIONAL PRE OP INSTRUCTIONS YOU RECEIVED FROM YOUR SURGEON'S OFFICE!!!     Oral Hygiene is also  important to reduce your risk of infection.                                    Remember - BRUSH YOUR TEETH THE MORNING OF SURGERY WITH YOUR REGULAR TOOTHPASTE   Take these medicines the morning of surgery with A SIP OF WATER: Tylenol, Allopurinol, Atorvastatin, Levothyroxine, Metoprolol, Percocet   DO NOT TAKE ANY ORAL DIABETIC MEDICATIONS DAY OF YOUR SURGERY  How to Manage Your Diabetes Before and After Surgery  Why is it  important to control my blood sugar before and after surgery? Improving blood sugar levels before and after surgery helps healing and can limit problems. A way of improving blood sugar control is eating a healthy diet by:  Eating less sugar and carbohydrates  Increasing activity/exercise  Talking with your doctor about reaching your blood sugar goals High blood sugars (greater than 180 mg/dL) can raise your risk of infections and slow your recovery, so you will need to focus on controlling your diabetes during the weeks before surgery. Make sure that the doctor who takes care of your diabetes knows about your planned surgery including the date and location.  How do I manage my blood sugar before surgery? Check your blood sugar at least 4 times a day, starting 2 days before surgery, to make sure that the level is not too high or low. Check your blood sugar the morning of your surgery when you wake up and every 2 hours until you get to the Short Stay unit. If your blood sugar is less than 70 mg/dL, you will need to treat for low blood sugar: Do not take insulin. Treat a low blood sugar (less than 70 mg/dL) with  cup of clear juice (cranberry or apple), 4 glucose tablets, OR glucose gel. Recheck blood sugar in 15 minutes after treatment (to make sure it is greater than 70 mg/dL). If your blood sugar is not greater than 70 mg/dL on recheck, call [phone] for further instructions. Report your blood sugar to the short stay nurse when you get to Short Stay.  If you are  admitted to the hospital after surgery: Your blood sugar will be checked by the staff and you will probably be given insulin after surgery (instead of oral diabetes medicines) to make sure you have good blood sugar levels. The goal for blood sugar control after surgery is 80-180 mg/dL.   WHAT DO I DO ABOUT MY DIABETES MEDICATION?  Do not take oral diabetes medicines (pills) the morning of surgery.  THE DAY BEFORE SURGERY, take Metformin as prescribed      THE MORNING OF SURGERY, do not take Metformin.   Reviewed and Endorsed by [facility] Patient Education Committee, August 2015                               You may not have any metal on your body including hair pins, jewelry, and body piercing             Do not wear make-up, lotions, powders, perfumes, or deodorant  Do not wear nail polish including gel and S&S, artificial/acrylic nails, or any other type of covering on natural nails including finger and toenails. If you have artificial nails, gel coating, etc. that needs to be removed by a nail salon please have this removed prior to surgery or surgery may need to be canceled/ delayed if the surgeon/ anesthesia feels like they are unable to be safely monitored.   Do not shave  48 hours prior to surgery.    Do not bring valuables to the hospital. [place].   Bring small overnight bag day of surgery.   DO NOT [place]. PHARMACY WILL DISPENSE MEDICATIONS LISTED ON YOUR MEDICATION LIST TO YOU DURING YOUR ADMISSION [place]!    Special Instructions: Bring  a copy of your healthcare power of attorney and living will documents         the day of surgery if you haven't scanned them before.              Please read over the following fact sheets you were given: IF YOU HAVE QUESTIONS ABOUT YOUR PRE-OP INSTRUCTIONS PLEASE CALL [place] [facility] - Preparing for Surgery Before  surgery, you can play an important role.  Because skin is not sterile, your skin needs to be as free of germs as possible.  You can reduce the number of germs on your skin by washing with CHG (chlorahexidine gluconate) soap before surgery.  CHG is an antiseptic cleaner which kills germs and bonds with the skin to continue killing germs even after washing. Please DO NOT use if you have an allergy to CHG or antibacterial soaps.  If your skin becomes reddened/irritated stop using the CHG and inform your nurse when you arrive at Short Stay. Do not shave (including legs and underarms) for at least 48 hours prior to the first CHG shower.  You may shave your face/neck.  Please follow these instructions carefully:  1.  Shower with CHG Soap the night before surgery and the  morning of surgery.  2.  If you choose to wash your hair, wash your hair first as usual with your normal  shampoo.  3.  After you shampoo, rinse your hair and body thoroughly to remove the shampoo.                             4.  Use CHG as you would any other liquid soap.  You can apply chg directly to the skin and wash.  Gently with a scrungie or clean washcloth.  5.  Apply the CHG Soap to your body ONLY FROM THE NECK DOWN.   Do   not use on face/ open                           Wound or open sores. Avoid contact with eyes, ears mouth and   genitals (private parts).                       Wash face,  Genitals (private parts) with your normal soap.             6.  Wash thoroughly, paying special attention to the area where your    surgery  will be performed.  7.  Thoroughly rinse your body with warm water from the neck down.  8.  DO NOT shower/wash with your normal soap after using and rinsing off the CHG Soap.                9.  Pat yourself dry with a clean towel.            10.  Wear clean pajamas.            11.  Place clean sheets on your bed the night of your first shower and do not  sleep with pets. Day of Surgery : Do not apply  any lotions/deodorants the morning of surgery.  Please wear clean clothes to the hospital/surgery center.  FAILURE TO FOLLOW THESE INSTRUCTIONS MAY RESULT IN THE CANCELLATION OF YOUR SURGERY  PATIENT SIGNATURE_________________________________  NURSE SIGNATURE__________________________________  ________________________________________________________________________  Incentive Spirometer  An incentive spirometer is a tool that can help keep your lungs clear and active. This tool measures how well you are filling your lungs with each breath. Taking long deep breaths may help reverse or decrease the chance of developing breathing (pulmonary) problems (especially infection) following: A long period of time when you are unable to move or be active. BEFORE THE PROCEDURE  If the spirometer includes an indicator to show your best effort, your nurse or respiratory therapist will set it to a desired goal. If possible, sit up straight or lean slightly forward. Try not to slouch. Hold the incentive spirometer in an upright position. INSTRUCTIONS FOR USE  Sit on the edge of your bed if possible, or sit up as far as you can in bed or on a chair. Hold the incentive spirometer in an upright position. Breathe out normally. Place the mouthpiece in your mouth and seal your lips tightly around it. Breathe in slowly and as deeply as possible, raising the piston or the ball toward the top of the column. Hold your breath for 3-5 seconds or for as long as possible. Allow the piston or ball to fall to the bottom of the column. Remove the mouthpiece from your mouth and breathe out normally. Rest for a few seconds and repeat Steps 1 through 7 at least 10 times every 1-2 hours when you are awake. Take your time and take a few normal breaths between deep breaths. The spirometer may include an indicator to show your best effort. Use the indicator as a goal to work toward during each repetition. After each set of 10  deep breaths, practice coughing to be sure your lungs are clear. If you have an incision (the cut made at the time of surgery), support your incision when coughing by placing a pillow or rolled up towels firmly against it. Once you are able to get out of bed, walk around indoors and cough well. You may stop using the incentive spirometer when instructed by your caregiver.  RISKS AND COMPLICATIONS Take your time so you do not get dizzy or light-headed. If you are in pain, you may need to take or ask for pain medication before doing incentive spirometry. It is harder to take a deep breath if you are having pain. AFTER USE Rest and breathe slowly and easily. It can be helpful to keep track of a log of your progress. Your caregiver can provide you with a simple table to help with this. If you are using the spirometer at home, follow these instructions: [place] IF:  You are having difficultly using the spirometer. You have trouble using the spirometer as often as instructed. Your pain medication is not giving enough relief while using the spirometer. You develop fever of 100.5 F (38.1 C) or higher. SEEK IMMEDIATE MEDICAL CARE IF:  You cough up bloody sputum that had not been present before. You develop fever of 102 F (38.9 C) or greater. You develop worsening pain at or near the incision site. MAKE SURE YOU:  Understand these instructions. Will watch your condition. Will get help right away if you are not doing well or get worse. Document Released: 01/15/2007 Document Revised: 11/27/2011 Document Reviewed: 03/18/2007 ExitCare Patient Information 2014 [place].   ________________________________________________________________________ [facility]- Preparing for Total Shoulder Arthroplasty    Before surgery, you can play an important role. Because skin is not sterile, your skin needs to be as free of germs as possible. You can  reduce the number of germs on your skin by using  the following products. Benzoyl Peroxide Gel Reduces the number of germs present on the skin Applied twice a day to shoulder area starting two days before surgery    ==================================================================  Please follow these instructions carefully:  BENZOYL PEROXIDE 5% GEL  Please do not use if you have an allergy to benzoyl peroxide.   If your skin becomes reddened/irritated stop using the benzoyl peroxide.  Starting two days before surgery, apply as follows: Apply benzoyl peroxide in the morning and at night. Apply after taking a shower. If you are not taking a shower clean entire shoulder front, back, and side along with the armpit with a clean wet washcloth.  Place a quarter-sized dollop on your shoulder and rub in thoroughly, making sure to cover the front, back, and side of your shoulder, along with the armpit.   2 days before ____ AM   ____ PM              1 day before ____ AM   ____ PM                         Do this twice a day for two days.  (Last application is the night before surgery, AFTER using the CHG soap as described below).  Do NOT apply benzoyl peroxide gel on the day of surgery.

## 2022-05-02 ENCOUNTER — Encounter (HOSPITAL_COMMUNITY)
Admission: RE | Admit: 2022-05-02 | Discharge: 2022-05-02 | Disposition: A | Discharge status: Home / Self Care | Payer: Medicare Other | Source: Ambulatory Visit | Attending: Orthopaedic Surgery | Admitting: Orthopaedic Surgery

## 2022-05-02 ENCOUNTER — Encounter (HOSPITAL_COMMUNITY): Payer: Self-pay

## 2022-05-02 VITALS — BP 157/75 | HR 68 | Temp 98.2°F | Resp 16 | Ht 63.0 in | Wt 230.0 lb

## 2022-05-02 DIAGNOSIS — E119 Type 2 diabetes mellitus without complications: Secondary | ICD-10-CM | POA: Diagnosis not present

## 2022-05-02 DIAGNOSIS — Z01812 Encounter for preprocedural laboratory examination: Secondary | ICD-10-CM | POA: Insufficient documentation

## 2022-05-02 DIAGNOSIS — Z01818 Encounter for other preprocedural examination: Secondary | ICD-10-CM

## 2022-05-02 HISTORY — DX: Other complications of anesthesia, initial encounter: T88.59XA

## 2022-05-02 LAB — BASIC METABOLIC PANEL
Anion gap: 10 (ref 5–15)
BUN: 30 mg/dL — ABNORMAL HIGH (ref 8–23)
CO2: 22 mmol/L (ref 22–32)
Calcium: 9.7 mg/dL (ref 8.9–10.3)
Chloride: 106 mmol/L (ref 98–111)
Creatinine, Ser: 1.11 mg/dL — ABNORMAL HIGH (ref 0.44–1.00)
GFR, Estimated: 52 mL/min — ABNORMAL LOW (ref >60)
Glucose, Bld: 115 mg/dL — ABNORMAL HIGH (ref 70–99)
Potassium: 4.3 mmol/L (ref 3.5–5.1)
Sodium: 138 mmol/L (ref 135–145)

## 2022-05-02 LAB — CBC
HCT: 43.3 % (ref 36.0–46.0)
Hemoglobin: 13.4 g/dL (ref 12.0–15.0)
MCH: 28.5 pg (ref 26.0–34.0)
MCHC: 30.9 g/dL (ref 30.0–36.0)
MCV: 92.1 fL (ref 80.0–100.0)
Platelets: 303 10*3/uL (ref 150–400)
RBC: 4.7 MIL/uL (ref 3.87–5.11)
RDW: 15.1 % (ref 11.5–15.5)
WBC: 9.3 10*3/uL (ref 4.0–10.5)
nRBC: 0 % (ref 0.0–0.2)

## 2022-05-02 LAB — SURGICAL PCR SCREEN
MRSA, PCR: NEGATIVE
Staphylococcus aureus: NEGATIVE

## 2022-05-02 LAB — HEMOGLOBIN A1C
Hgb A1c MFr Bld: 5.4 % (ref 4.8–5.6)
Mean Plasma Glucose: 108.28 mg/dL

## 2022-05-02 LAB — GLUCOSE, CAPILLARY: Glucose-Capillary: 105 mg/dL — ABNORMAL HIGH (ref 70–99)

## 2022-05-09 NOTE — H&P (Signed)
PREOPERATIVE H&P  Chief Complaint: right DJD shoulder  HPI: Natasha Warren is a 76 y.o. female who is scheduled for Procedure(s): REVERSE SHOULDER ARTHROPLASTY.   Patient has a past medical history significant for complication of anesthesia, DM, hyperparathyroidism, hypothyroidism, SVT, HTN.   This is a 76 year old who has a history of cervical issues, as well as low back issues.  She is in pain management.  She is taking two oxycodone 5 mg b.i.d.  She is walker dependent.  She has diabetes with unknown A1C.  With these issues there was some concern that her neck issues were not in isolation and may be shoulder pathology.  She has had multiple falls, including a fall earlier today.  She feels that her balance is relatively at its baseline.  Her primary issue is that she cannot raise her arms over her head.  She also has inability to close her hands fully.  She has numbness and tingling that radiates down her arms occasionally.    Symptoms are rated as moderate to severe, and have been worsening.  This is significantly impairing activities of daily living.    Please see clinic note for further details on this patient's care.    She has elected for surgical management.   Past Medical History:  Diagnosis Date   Arthritis    Bulging lumbar disc    Colon polyps    Complication of anesthesia    hard to wake after back surgery, block for rotator cuff 'didn't work'   Diabetes mellitus without complication ([place])    History of gout    History of gout    Hyperlipidemia    Hyperparathyroidism, primary ([place])    Hypertension    Hypothyroidism    Irritable bowel syndrome    Nocturia    Renal disorder    kidney stones   Seasonal allergies    SVT (supraventricular tachycardia) ([place])    Thyroid disorder    Urolithiasis    Past Surgical History:  Procedure Laterality Date   Petroleum SURGERY  09/2016   bladder tack  09/1984    CHOLECYSTECTOMY  01/1999   COLONOSCOPY  06/2006   This was her second one   COLONOSCOPY  07/12/2011   Procedure: COLONOSCOPY;  Surgeon: Rogene Houston, MD;  [facility];  Service: Endoscopy;  Laterality: N/A;  10:30 am   COLONOSCOPY N/A 04/03/2018   Procedure: COLONOSCOPY;  Surgeon: Rogene Houston, MD;  [facility];  Service: Endoscopy;  Laterality: N/A;  Post Falls Left 10/03/2014   Procedure: CYSTOSCOPY WITH URETERAL STENT PLACEMENT;  Surgeon: Malka So, MD;  Location: [facility];  Service: Urology;  Laterality: Left;   CYSTOSCOPY WITH URETEROSCOPY AND STENT PLACEMENT Left 03/16/2015   Procedure: CYSTOSCOPY WITH LEFT STENT PLACEMENT;  Surgeon: Franchot Gallo, MD;  Location: [facility];  Service: Urology;  Laterality: Left;   gout removal  2017   HOLMIUM LASER APPLICATION Left 76/16/0737   Procedure: HOLMIUM LASER APPLICATION;  Surgeon: Franchot Gallo, MD;  Location: [facility];  Service: Urology;  Laterality: Left;   KNEE ARTHROSCOPY  12/2010   Left knee   LITHOTRIPSY  06/1995   LUMBAR FUSION  06/2016   NEPHROLITHOTOMY Left 01/07/2015   Procedure: NEPHROLITHOTOMY PERCUTANEOUS;  Surgeon: Franchot Gallo, MD;  [facility];  Service: Urology;  Laterality: Left;  With STENT   POLYPECTOMY  04/03/2018  Procedure: POLYPECTOMY;  Surgeon: Rogene Houston, MD;  [facility];  Service: Endoscopy;;  transverse,splenic flexure, sigmoid   Rotor Cuff  2011   Right Shoulder   STENT REMOVAL Left 03/16/2015   Procedure: LEFT URETERAL STENT REMOVAL;  Surgeon: Franchot Gallo, MD;  Location: [facility];  Service: Urology;  Laterality: Left;   THYROID EXPLORATION N/A 07/31/2013   Procedure: neck EXPLORATION;  Surgeon: Odis Hollingshead, MD;  [facility];  Service: General;  Laterality: N/A;   THYROIDECTOMY N/A 07/31/2013   Procedure: PARATHYROIDECTOMY;  Surgeon: Odis Hollingshead, MD;  [facility];  Service: General;   Laterality: N/A;   TUMOR REMOVAL  06/1997   Beign; on back x2 surgeries   TUMOR REMOVAL  2002   "FATTY TUMORS" REMOVED FROM BACK   URETEROSCOPY WITH HOLMIUM LASER LITHOTRIPSY Left 03/16/2015   Procedure: LEFT URETEROSCOPIC LASER LITHOTRIPSY WITH STONE EXTRACTION;  Surgeon: Franchot Gallo, MD;  Location: [facility];  Service: Urology;  Laterality: Left;   WISDOM TOOTH EXTRACTION     Social History   Socioeconomic History   Marital status: Widowed    Spouse name: Not on file   Number of children: Not on file   Years of education: Not on file   Highest education level: Not on file  Occupational History   Not on file  Tobacco Use   Smoking status: Never   Smokeless tobacco: Never  Vaping Use   Vaping Use: Never used  Substance and Sexual Activity   Alcohol use: Not Currently    Comment: Very Rarely 1/2 glass of beer   Drug use: No   Sexual activity: Not Currently  Other Topics Concern   Not on file  Social History Narrative   ** Merged History Encounter **       Social Determinants of Health   Financial Resource Strain: Not on file  Food Insecurity: Not on file  Transportation Needs: Not on file  Physical Activity: Not on file  Stress: Not on file  Social Connections: Not on file   Family History  Problem Relation Age of Onset   Hypertension Mother    Arthritis Mother    Hypertension Father    Healthy Brother    Healthy Daughter    Healthy Daughter    Colon cancer Neg Hx    Allergies  Allergen Reactions   Ace Inhibitors Other (See Comments)    Patient doesn't remember this allergy.  On 12/30/2014 received LOV visit from PCP , Dr Rory Percy in [place], [place].  - No known active drug allergies listed in his office visit note dated 05/21/2014.     Venlafaxine Anxiety   Prior to Admission medications   Medication Sig Start Date End Date Taking? Authorizing Provider  allopurinol (ZYLOPRIM) 300 MG tablet Take 300 mg by mouth daily.   Yes [provider]  aspirin  EC 81 MG tablet Take 81 mg by mouth daily. Swallow whole.   Yes [provider]  atorvastatin (LIPITOR) 20 MG tablet Take 20 mg by mouth every evening.   Yes [provider]  Cholecalciferol (VITAMIN D3) 125 MCG (5000 UT) CAPS Take 5,000 Units by mouth daily.   Yes [provider]  gabapentin (NEURONTIN) 300 MG capsule Take 300 mg by mouth at bedtime.   Yes [provider]  levothyroxine (SYNTHROID) 50 MCG tablet Take 50 mcg by mouth daily before breakfast.   Yes [provider]  losartan (COZAAR) 100 MG tablet Take 100 mg  by mouth daily.   Yes [provider]  metFORMIN (GLUCOPHAGE) 500 MG tablet Take 500 mg by mouth daily with breakfast.   Yes [provider]  metoprolol succinate (TOPROL-XL) 50 MG 24 hr tablet Take 75 mg by mouth daily. Take with or immediately following a meal.   Yes [provider]  oxyCODONE-acetaminophen (PERCOCET/ROXICET) 5-325 MG tablet Take 1 tablet by mouth every 6 (six) hours as needed for severe pain.   Yes [provider]  Potassium Citrate 15 MEQ (1620 MG) TBCR Take 1 tablet by mouth in the morning and at bedtime.   Yes [provider]  acetaminophen (TYLENOL) 500 MG tablet Take 1,000 mg by mouth every 6 (six) hours as needed for moderate pain or headache.     [provider]  allopurinol (ZYLOPRIM) 300 MG tablet Take 300 mg by mouth daily.  05/14/16   [provider]  aspirin EC 81 MG tablet Take 81 mg by mouth daily.    [provider]  atorvastatin (LIPITOR) 20 MG tablet Take 20 mg by mouth daily.    [provider]  Cholecalciferol (VITAMIN D3) 1000 units CAPS Take 1,000 Units by mouth daily.    [provider]  docusate sodium (COLACE) 50 MG capsule Take 250 mg by mouth daily.  Patient not taking: Reported on 11/22/2021    [provider]  gabapentin (NEURONTIN) 300 MG capsule Take 300 mg by mouth at bedtime.    [provider]  levothyroxine (SYNTHROID, LEVOTHROID) 50 MCG tablet Take 50 mcg by mouth daily before breakfast.    [provider]  losartan (COZAAR) 100 MG tablet Take 100 mg by mouth daily.  03/17/16   [provider]  metFORMIN (GLUCOPHAGE) 500 MG tablet Take 500 mg by mouth daily. 02/25/20   [provider]  metFORMIN (GLUCOPHAGE-XR) 500 MG 24 hr tablet Take 500 mg by mouth daily with breakfast.  05/14/16   [provider]  metoprolol succinate (TOPROL-XL) 50 MG 24 hr tablet Take 1 & 1/2 tablets daily. 09/28/15   Arnoldo Lenis, MD  oxyCODONE-acetaminophen (PERCOCET/ROXICET) 5-325 MG tablet Take 1 tablet by mouth 2 (two) times a day.     [provider]  polyethylene glycol (MIRALAX / GLYCOLAX) 17 g packet Take 17 g by mouth daily.    [provider]  Potassium Citrate 15 MEQ (1620 MG) TBCR TAKE 1 TABLET BY MOUTH THREE TIMES DAILY 02/28/22   Franchot Gallo, MD  tizanidine (ZANAFLEX) 2 MG capsule Take 2 mg by mouth 3 (three) times daily as needed for muscle spasms.     [provider]  tiZANidine (ZANAFLEX) 2 MG tablet Take by mouth. 01/24/22   [provider]  zinc gluconate 50 MG tablet Take 50 mg by mouth daily.    [provider]    ROS: All other systems have been reviewed and were otherwise negative with the exception of those mentioned in the HPI and as above.  Physical Exam: General: Alert, no acute distress Cardiovascular: No pedal edema Respiratory: No cyanosis, no use of accessory musculature GI: No organomegaly, abdomen is soft and non-tender Skin: No lesions in the area of chief complaint Neurologic: Sensation intact distally Psychiatric: Patient is competent for consent with normal mood and affect Lymphatic: No axillary or cervical lymphadenopathy  MUSCULOSKELETAL:  Range of motion of the bilateral shoulders is to about 80 degrees actively.  Passively to about 120, limited by pain.  Distal  motor and sensory  function is relatively intact, but she has a DPC of 3 in bilateral hands.  External rotation symmetric to 10.  Internal rotation to the back pocket, symmetric.  Cuff strength 4/5 throughout.    Imaging: MRI of bilateral shoulders is reviewed.  Right shoulder demonstrates previous cuff repair with avulsion and full thickness re-tear.  Left shoulder demonstrates diffuse tendinosis.  Moderate glenohumeral arthritis.  No obvious full thickness tear.    BMI: Estimated body mass index is 40.74 kg/m as calculated from the following:   Height as of 05/02/22: '5\' 3"'$  (1.6 m).   Weight as of 05/02/22: 104.3 kg.  Lab Results  Component Value Date   ALBUMIN 4.1 09/16/2015   Diabetes: Patient does not have a diagnosis of diabetes. Lab Results  Component Value Date   HGBA1C 5.4 05/02/2022     Smoking Status:   reports that she has never smoked. She has never used smokeless tobacco.     Assessment: right DJD shoulder  Plan: Plan for Procedure(s): REVERSE SHOULDER ARTHROPLASTY  The patient has multiple issues going on.  Her situation is quite complex from a clinical standpoint.  We think that a reverse total shoulder arthroplasty is reasonable to consider on the right shoulder.  She may end up needing one on the left as well considering her constellation of issues. Risks, benefits and alternatives of reverse total shoulder arthroplasty including increased risk of dislocation were discussed.  She will likely need to have use of her arm right away.  She will be kept overnight at [facility] and work with therapy and then be discharged home.    The risks benefits and alternatives were discussed with the patient including but not limited to the risks of nonoperative treatment, versus surgical intervention including infection, bleeding, nerve injury,  blood clots, cardiopulmonary complications, morbidity, mortality, among others, and they were willing to proceed.   We additionally  specifically discussed risks of axillary nerve injury, infection, periprosthetic fracture, continued pain and longevity of implants prior to beginning procedure.    Patient will be admitted for inpatient treatment for surgery, pain control, OT, prophylactic antibiotics,  VTE prophylaxis, and discharge planning. The patient is planning to be discharged home with outpatient PT.   The patient acknowledged the explanation, agreed to proceed with the plan and consent was signed.   She received operative clearance from PCP, Dr. Jimmye Norman, cardiologist, Dr. Harl Bowie, and urologist, Dr. Diona Fanti.   Operative Plan: Right reverse total shoulder arthroplasty Discharge Medications: Standard DVT Prophylaxis: Aspirin Physical Therapy: Outpatient PT Special Discharge needs: [place]. [place], PA-C  05/09/2022 5:49 PM

## 2022-05-10 ENCOUNTER — Observation Stay (HOSPITAL_COMMUNITY): Payer: Medicare Other

## 2022-05-10 ENCOUNTER — Encounter (HOSPITAL_COMMUNITY)
Admission: RE | Disposition: A | Discharge status: Home / Self Care | Payer: Self-pay | Source: Ambulatory Visit | Attending: Orthopaedic Surgery

## 2022-05-10 ENCOUNTER — Other Ambulatory Visit: Payer: Self-pay

## 2022-05-10 ENCOUNTER — Encounter (HOSPITAL_COMMUNITY): Payer: Self-pay | Admitting: Orthopaedic Surgery

## 2022-05-10 ENCOUNTER — Ambulatory Visit (HOSPITAL_COMMUNITY): Payer: Medicare Other

## 2022-05-10 ENCOUNTER — Observation Stay (HOSPITAL_COMMUNITY)
Admission: RE | Admit: 2022-05-10 | Discharge: 2022-05-11 | Disposition: A | Discharge status: Home / Self Care | Payer: Medicare Other | Source: Ambulatory Visit | Attending: Orthopaedic Surgery | Admitting: Orthopaedic Surgery

## 2022-05-10 ENCOUNTER — Ambulatory Visit (HOSPITAL_BASED_OUTPATIENT_CLINIC_OR_DEPARTMENT_OTHER): Payer: Medicare Other

## 2022-05-10 DIAGNOSIS — E119 Type 2 diabetes mellitus without complications: Secondary | ICD-10-CM | POA: Diagnosis not present

## 2022-05-10 DIAGNOSIS — Z7984 Long term (current) use of oral hypoglycemic drugs: Secondary | ICD-10-CM | POA: Diagnosis not present

## 2022-05-10 DIAGNOSIS — M75101 Unspecified rotator cuff tear or rupture of right shoulder, not specified as traumatic: Principal | ICD-10-CM | POA: Insufficient documentation

## 2022-05-10 DIAGNOSIS — E039 Hypothyroidism, unspecified: Secondary | ICD-10-CM | POA: Diagnosis not present

## 2022-05-10 DIAGNOSIS — Z79899 Other long term (current) drug therapy: Secondary | ICD-10-CM | POA: Diagnosis not present

## 2022-05-10 DIAGNOSIS — I10 Essential (primary) hypertension: Secondary | ICD-10-CM

## 2022-05-10 DIAGNOSIS — Z7982 Long term (current) use of aspirin: Secondary | ICD-10-CM | POA: Insufficient documentation

## 2022-05-10 DIAGNOSIS — Z96611 Presence of right artificial shoulder joint: Secondary | ICD-10-CM

## 2022-05-10 HISTORY — PX: REVERSE SHOULDER ARTHROPLASTY: SHX5054

## 2022-05-10 LAB — GLUCOSE, CAPILLARY
Glucose-Capillary: 131 mg/dL — ABNORMAL HIGH (ref 70–99)
Glucose-Capillary: 128 mg/dL — ABNORMAL HIGH (ref 70–99)
Glucose-Capillary: 139 mg/dL — ABNORMAL HIGH (ref 70–99)
Glucose-Capillary: 273 mg/dL — ABNORMAL HIGH (ref 70–99)

## 2022-05-10 SURGERY — ARTHROPLASTY, SHOULDER, TOTAL, REVERSE
Anesthesia: Regional | Site: Shoulder | Laterality: Right

## 2022-05-10 MED ORDER — CHLORHEXIDINE GLUCONATE 0.12 % MT SOLN
15.0000 mL | Freq: Once | OROMUCOSAL | Status: AC
  Administered 2022-05-10: 15 mL via OROMUCOSAL

## 2022-05-10 MED ORDER — VANCOMYCIN HCL 1000 MG IV SOLR
INTRAVENOUS | Status: AC
  Filled 2022-05-10: qty 20

## 2022-05-10 MED ORDER — GABAPENTIN 300 MG PO CAPS
300.0000 mg | ORAL_CAPSULE | Freq: Every day | ORAL | Status: DC
  Administered 2022-05-10: 300 mg via ORAL
  Filled 2022-05-10: qty 1

## 2022-05-10 MED ORDER — OXYCODONE HCL 5 MG PO TABS: 5.0000 mg | ORAL_TABLET | Freq: Four times a day (QID) | ORAL | Status: DC | PRN

## 2022-05-10 MED ORDER — OXYCODONE HCL 5 MG PO TABS: 10.0000 mg | ORAL_TABLET | Freq: Four times a day (QID) | ORAL | Status: DC | PRN

## 2022-05-10 MED ORDER — POLYETHYLENE GLYCOL 3350 17 G PO PACK: 17.0000 g | PACK | Freq: Every day | ORAL | Status: DC | PRN

## 2022-05-10 MED ORDER — 0.9 % SODIUM CHLORIDE (POUR BTL) OPTIME
TOPICAL | Status: DC | PRN
  Administered 2022-05-10: 1000 mL

## 2022-05-10 MED ORDER — PHENOL 1.4 % MT LIQD: 1.0000 | OROMUCOSAL | Status: DC | PRN

## 2022-05-10 MED ORDER — INSULIN ASPART 100 UNIT/ML IJ SOLN
0.0000 [IU] | Freq: Three times a day (TID) | INTRAMUSCULAR | Status: DC
  Administered 2022-05-11 (×2): 3 [IU] via SUBCUTANEOUS

## 2022-05-10 MED ORDER — PANTOPRAZOLE SODIUM 40 MG PO TBEC
40.0000 mg | DELAYED_RELEASE_TABLET | Freq: Every day | ORAL | Status: DC
  Administered 2022-05-10 – 2022-05-11 (×2): 40 mg via ORAL
  Filled 2022-05-10 (×2): qty 1

## 2022-05-10 MED ORDER — LOSARTAN POTASSIUM 50 MG PO TABS
100.0000 mg | ORAL_TABLET | Freq: Every day | ORAL | Status: DC
  Administered 2022-05-11: 100 mg via ORAL
  Filled 2022-05-10: qty 2

## 2022-05-10 MED ORDER — PROPOFOL 10 MG/ML IV BOLUS
INTRAVENOUS | Status: DC | PRN
  Administered 2022-05-10 (×2): 30 mg via INTRAVENOUS
  Administered 2022-05-10: 100 mg via INTRAVENOUS

## 2022-05-10 MED ORDER — POTASSIUM CITRATE ER 15 MEQ (1620 MG) PO TBCR: 1.0000 | EXTENDED_RELEASE_TABLET | Freq: Three times a day (TID) | ORAL | Status: DC

## 2022-05-10 MED ORDER — ONDANSETRON HCL 4 MG PO TABS: 4.0000 mg | ORAL_TABLET | Freq: Four times a day (QID) | ORAL | Status: DC | PRN

## 2022-05-10 MED ORDER — LACTATED RINGERS IV SOLN: INTRAVENOUS | Status: DC

## 2022-05-10 MED ORDER — TRANEXAMIC ACID-NACL 1000-0.7 MG/100ML-% IV SOLN
1000.0000 mg | INTRAVENOUS | Status: AC
  Administered 2022-05-10: 1000 mg via INTRAVENOUS
  Filled 2022-05-10: qty 100

## 2022-05-10 MED ORDER — DOCUSATE SODIUM 100 MG PO CAPS
100.0000 mg | ORAL_CAPSULE | Freq: Two times a day (BID) | ORAL | Status: DC
  Administered 2022-05-10 – 2022-05-11 (×2): 100 mg via ORAL
  Filled 2022-05-10 (×2): qty 1

## 2022-05-10 MED ORDER — BUPIVACAINE-EPINEPHRINE (PF) 0.5% -1:200000 IJ SOLN
INTRAMUSCULAR | Status: DC | PRN
  Administered 2022-05-10: 15 mL

## 2022-05-10 MED ORDER — LEVOTHYROXINE SODIUM 50 MCG PO TABS
50.0000 ug | ORAL_TABLET | Freq: Every day | ORAL | Status: DC
  Administered 2022-05-11: 50 ug via ORAL
  Filled 2022-05-10: qty 1

## 2022-05-10 MED ORDER — MENTHOL 3 MG MT LOZG: 1.0000 | LOZENGE | OROMUCOSAL | Status: DC | PRN

## 2022-05-10 MED ORDER — SODIUM CHLORIDE 0.9 % IR SOLN
Status: DC | PRN
  Administered 2022-05-10: 1000 mL

## 2022-05-10 MED ORDER — BUPIVACAINE LIPOSOME 1.3 % IJ SUSP
INTRAMUSCULAR | Status: DC | PRN
  Administered 2022-05-10: 133 mg via PERINEURAL

## 2022-05-10 MED ORDER — SUGAMMADEX SODIUM 200 MG/2ML IV SOLN
INTRAVENOUS | Status: DC | PRN
  Administered 2022-05-10: 200 mg via INTRAVENOUS

## 2022-05-10 MED ORDER — POTASSIUM CITRATE ER 10 MEQ (1080 MG) PO TBCR
10.0000 meq | EXTENDED_RELEASE_TABLET | Freq: Three times a day (TID) | ORAL | Status: DC
  Administered 2022-05-11 (×2): 10 meq via ORAL
  Filled 2022-05-10 (×3): qty 1

## 2022-05-10 MED ORDER — TIZANIDINE HCL 2 MG PO TABS
2.0000 mg | ORAL_TABLET | Freq: Three times a day (TID) | ORAL | Status: DC | PRN
Start: 2022-05-10 — End: 2022-05-11

## 2022-05-10 MED ORDER — ROCURONIUM BROMIDE 10 MG/ML (PF) SYRINGE
PREFILLED_SYRINGE | INTRAVENOUS | Status: DC | PRN
  Administered 2022-05-10: 80 mg via INTRAVENOUS

## 2022-05-10 MED ORDER — OXYCODONE HCL 5 MG PO TABS
5.0000 mg | ORAL_TABLET | ORAL | Status: DC | PRN
  Administered 2022-05-11: 5 mg via ORAL
  Filled 2022-05-10: qty 1

## 2022-05-10 MED ORDER — HYDROMORPHONE HCL 1 MG/ML IJ SOLN: 0.5000 mg | INTRAMUSCULAR | Status: DC | PRN

## 2022-05-10 MED ORDER — ATORVASTATIN CALCIUM 20 MG PO TABS
20.0000 mg | ORAL_TABLET | Freq: Every day | ORAL | Status: DC
  Administered 2022-05-10 – 2022-05-11 (×2): 20 mg via ORAL
  Filled 2022-05-10 (×2): qty 1

## 2022-05-10 MED ORDER — DIPHENHYDRAMINE HCL 12.5 MG/5ML PO ELIX: 12.5000 mg | ORAL_SOLUTION | ORAL | Status: DC | PRN

## 2022-05-10 MED ORDER — ACETAMINOPHEN 500 MG PO TABS
1000.0000 mg | ORAL_TABLET | Freq: Three times a day (TID) | ORAL | Status: DC
  Administered 2022-05-10 – 2022-05-11 (×3): 1000 mg via ORAL
  Filled 2022-05-10 (×3): qty 2

## 2022-05-10 MED ORDER — ALLOPURINOL 300 MG PO TABS
300.0000 mg | ORAL_TABLET | Freq: Every day | ORAL | Status: DC
  Administered 2022-05-11: 300 mg via ORAL
  Filled 2022-05-10: qty 1

## 2022-05-10 MED ORDER — ONDANSETRON HCL 4 MG/2ML IJ SOLN: 4.0000 mg | Freq: Four times a day (QID) | INTRAMUSCULAR | Status: DC | PRN

## 2022-05-10 MED ORDER — ORAL CARE MOUTH RINSE: 15.0000 mL | Freq: Once | OROMUCOSAL | Status: AC

## 2022-05-10 MED ORDER — ACETAMINOPHEN 500 MG PO TABS
1000.0000 mg | ORAL_TABLET | Freq: Once | ORAL | Status: AC
  Administered 2022-05-10: 1000 mg via ORAL
  Filled 2022-05-10: qty 2

## 2022-05-10 MED ORDER — MIDAZOLAM HCL 2 MG/2ML IJ SOLN
1.0000 mg | INTRAMUSCULAR | Status: DC
  Administered 2022-05-10: 1 mg via INTRAVENOUS
  Filled 2022-05-10: qty 2

## 2022-05-10 MED ORDER — PHENYLEPHRINE HCL-NACL 20-0.9 MG/250ML-% IV SOLN
INTRAVENOUS | Status: DC | PRN
  Administered 2022-05-10: 40 ug/min via INTRAVENOUS

## 2022-05-10 MED ORDER — DEXAMETHASONE SODIUM PHOSPHATE 10 MG/ML IJ SOLN
INTRAMUSCULAR | Status: DC | PRN
  Administered 2022-05-10: 10 mg via INTRAVENOUS

## 2022-05-10 MED ORDER — VANCOMYCIN HCL 1 G IV SOLR
INTRAVENOUS | Status: DC | PRN
  Administered 2022-05-10: 1000 mg via TOPICAL

## 2022-05-10 MED ORDER — METOPROLOL SUCCINATE ER 50 MG PO TB24
75.0000 mg | ORAL_TABLET | Freq: Every day | ORAL | Status: DC
  Administered 2022-05-11: 75 mg via ORAL
  Filled 2022-05-10: qty 1

## 2022-05-10 MED ORDER — OXYCODONE-ACETAMINOPHEN 5-325 MG PO TABS
1.0000 | ORAL_TABLET | Freq: Two times a day (BID) | ORAL | Status: DC
  Administered 2022-05-10: 1 via ORAL
  Filled 2022-05-10: qty 1

## 2022-05-10 MED ORDER — BISACODYL 10 MG RE SUPP: 10.0000 mg | Freq: Every day | RECTAL | Status: DC | PRN

## 2022-05-10 MED ORDER — EPHEDRINE SULFATE-NACL 50-0.9 MG/10ML-% IV SOSY
PREFILLED_SYRINGE | INTRAVENOUS | Status: DC | PRN
  Administered 2022-05-10: 10 mg via INTRAVENOUS

## 2022-05-10 MED ORDER — CEFAZOLIN SODIUM-DEXTROSE 2-4 GM/100ML-% IV SOLN
2.0000 g | INTRAVENOUS | Status: AC
  Administered 2022-05-10: 2 g via INTRAVENOUS
  Filled 2022-05-10: qty 100

## 2022-05-10 MED ORDER — FENTANYL CITRATE (PF) 250 MCG/5ML IJ SOLN
INTRAMUSCULAR | Status: AC
  Filled 2022-05-10: qty 5

## 2022-05-10 MED ORDER — ALUM & MAG HYDROXIDE-SIMETH 200-200-20 MG/5ML PO SUSP: 30.0000 mL | ORAL | Status: DC | PRN

## 2022-05-10 MED ORDER — CEFAZOLIN SODIUM-DEXTROSE 2-4 GM/100ML-% IV SOLN
2.0000 g | Freq: Four times a day (QID) | INTRAVENOUS | Status: AC
  Administered 2022-05-10 – 2022-05-11 (×3): 2 g via INTRAVENOUS
  Filled 2022-05-10 (×3): qty 100

## 2022-05-10 MED ORDER — PROPOFOL 10 MG/ML IV BOLUS
INTRAVENOUS | Status: AC
  Filled 2022-05-10: qty 20

## 2022-05-10 MED ORDER — ONDANSETRON HCL 4 MG/2ML IJ SOLN
INTRAMUSCULAR | Status: DC | PRN
  Administered 2022-05-10: 4 mg via INTRAVENOUS

## 2022-05-10 MED ORDER — FENTANYL CITRATE (PF) 100 MCG/2ML IJ SOLN
INTRAMUSCULAR | Status: DC | PRN
  Administered 2022-05-10: 50 ug via INTRAVENOUS

## 2022-05-10 MED ORDER — FENTANYL CITRATE PF 50 MCG/ML IJ SOSY
50.0000 ug | PREFILLED_SYRINGE | INTRAMUSCULAR | Status: DC
  Administered 2022-05-10: 50 ug via INTRAVENOUS
  Filled 2022-05-10: qty 2

## 2022-05-10 MED ORDER — STERILE WATER FOR IRRIGATION IR SOLN
Status: DC | PRN
  Administered 2022-05-10: 2000 mL

## 2022-05-10 MED ORDER — INSULIN ASPART 100 UNIT/ML IJ SOLN
0.0000 [IU] | Freq: Every day | INTRAMUSCULAR | Status: DC
  Administered 2022-05-10: 3 [IU] via SUBCUTANEOUS

## 2022-05-10 SURGICAL SUPPLY — 71 items
AID PSTN UNV HD RSTRNT DISP (MISCELLANEOUS) ×1
APL PRP STRL LF DISP 70% ISPRP (MISCELLANEOUS) ×2
BAG COUNTER SPONGE SURGICOUNT (BAG) ×1 IMPLANT
BAG SPNG CNTER NS LX DISP (BAG) ×1
BASEPLATE GLENOSPHERE 25 STD (Miscellaneous) IMPLANT
BIT DRILL 3.2 PERIPHERAL SCREW (BIT) IMPLANT
BLADE SAW SGTL 73X25 THK (BLADE) ×1 IMPLANT
BSPLAT GLND STD 25 RVRS SHLDR (Miscellaneous) ×1 IMPLANT
CHLORAPREP W/TINT 26 (MISCELLANEOUS) ×2 IMPLANT
CLSR STERI-STRIP ANTIMIC 1/2X4 (GAUZE/BANDAGES/DRESSINGS) ×1 IMPLANT
COOLER ICEMAN CLASSIC (MISCELLANEOUS) IMPLANT
COVER BACK TABLE 60X90IN (DRAPES) ×1 IMPLANT
COVER SURGICAL LIGHT HANDLE (MISCELLANEOUS) ×1 IMPLANT
DRAPE C-ARM 42X120 X-RAY (DRAPES) IMPLANT
DRAPE INCISE IOBAN 66X45 STRL (DRAPES) ×1 IMPLANT
DRAPE ORTHO SPLIT 77X108 STRL (DRAPES) ×2
DRAPE SHEET LG 3/4 BI-LAMINATE (DRAPES) ×2 IMPLANT
DRAPE SURG ORHT 6 SPLT 77X108 (DRAPES) ×2 IMPLANT
DRSG AQUACEL AG ADV 3.5X 6 (GAUZE/BANDAGES/DRESSINGS) ×1 IMPLANT
ELECT BLADE TIP CTD 4 INCH (ELECTRODE) ×1 IMPLANT
ELECT PENCIL ROCKER SW 15FT (MISCELLANEOUS) IMPLANT
ELECT REM PT RETURN 15FT ADLT (MISCELLANEOUS) ×1 IMPLANT
FACESHIELD WRAPAROUND (MASK) ×3 IMPLANT
FACESHIELD WRAPAROUND OR TEAM (MASK) ×1 IMPLANT
GLENOSPHERE REV SHOULDER 36 (Joint) IMPLANT
GLOVE BIO SURGEON STRL SZ 6.5 (GLOVE) ×2 IMPLANT
GLOVE BIOGEL PI IND STRL 6.5 (GLOVE) ×1 IMPLANT
GLOVE BIOGEL PI IND STRL 8 (GLOVE) ×1 IMPLANT
GLOVE BIOGEL PI INDICATOR 6.5 (GLOVE) ×1
GLOVE BIOGEL PI INDICATOR 8 (GLOVE) ×1
GLOVE ECLIPSE 8.0 STRL XLNG CF (GLOVE) ×2 IMPLANT
GOWN STRL REUS W/ TWL LRG LVL3 (GOWN DISPOSABLE) ×2 IMPLANT
GOWN STRL REUS W/TWL LRG LVL3 (GOWN DISPOSABLE) ×2
GUIDEWIRE GLENOID 2.5X220 (WIRE) IMPLANT
HANDPIECE INTERPULSE COAX TIP (DISPOSABLE) ×1
HUMERAL STEM AEQUALIS 3BX74MM (Stem) ×1 IMPLANT
IMPL REVERSE SHOULDER 0X3.5 (Shoulder) IMPLANT
IMPLANT REVERSE SHOULDER 0X3.5 (Shoulder) ×1 IMPLANT
INSERT HUMERAL 36X6MM 12.5DEG (Insert) IMPLANT
KIT BASIN OR (CUSTOM PROCEDURE TRAY) ×1 IMPLANT
KIT STABILIZATION SHOULDER (MISCELLANEOUS) ×1 IMPLANT
KIT TURNOVER KIT A (KITS) IMPLANT
MANIFOLD NEPTUNE II (INSTRUMENTS) ×1 IMPLANT
NDL MAYO CATGUT SZ4 TPR NDL (NEEDLE) IMPLANT
NEEDLE MAYO CATGUT SZ4 (NEEDLE) IMPLANT
NS IRRIG 1000ML POUR BTL (IV SOLUTION) ×1 IMPLANT
PACK SHOULDER (CUSTOM PROCEDURE TRAY) ×1 IMPLANT
PAD COLD SHLDR WRAP-ON (PAD) IMPLANT
RESTRAINT HEAD UNIVERSAL NS (MISCELLANEOUS) ×1 IMPLANT
SCREW 5.0X18 (Screw) IMPLANT
SCREW BONE 6.5 OD 30 NON BIO (Screw) IMPLANT
SCREW PERIPHERAL 5.0X34 (Screw) IMPLANT
SET HNDPC FAN SPRY TIP SCT (DISPOSABLE) ×1 IMPLANT
SLING ARM FOAM STRAP LRG (SOFTGOODS) IMPLANT
SLING ULTRA II L (ORTHOPEDIC SUPPLIES) IMPLANT
SLING ULTRA III MED (ORTHOPEDIC SUPPLIES) ×1 IMPLANT
SPONGE T-LAP 4X18 ~~LOC~~+RFID (SPONGE) ×1 IMPLANT
STEM HUMERAL AEQUALIS 3BX74MM (Stem) IMPLANT
SUCTION FRAZIER HANDLE 12FR (TUBING) ×1
SUCTION TUBE FRAZIER 12FR DISP (TUBING) ×1 IMPLANT
SUT ETHIBOND 2 V 37 (SUTURE) ×1 IMPLANT
SUT ETHIBOND NAB CT1 #1 30IN (SUTURE) ×1 IMPLANT
SUT FIBERWIRE #5 38 CONV NDL (SUTURE) ×4
SUT MNCRL AB 4-0 PS2 18 (SUTURE) ×1 IMPLANT
SUT VIC AB 0 CT1 36 (SUTURE) IMPLANT
SUT VIC AB 3-0 SH 27 (SUTURE) ×1
SUT VIC AB 3-0 SH 27X BRD (SUTURE) ×1 IMPLANT
SUTURE FIBERWR #5 38 CONV NDL (SUTURE) IMPLANT
TOWEL OR 17X26 10 PK STRL BLUE (TOWEL DISPOSABLE) ×1 IMPLANT
TUBE SUCTION HIGH CAP CLEAR NV (SUCTIONS) ×1 IMPLANT
WATER STERILE IRR 1000ML POUR (IV SOLUTION) ×2 IMPLANT

## 2022-05-10 NOTE — Interval H&P Note (Signed)
All questions answered, patient wants to proceed with procedure. ? ?

## 2022-05-10 NOTE — Anesthesia Preprocedure Evaluation (Signed)
Anesthesia Evaluation  Patient identified by MRN, date of birth, ID band Patient awake    Reviewed: Allergy & Precautions, NPO status , Patient's Chart, lab work & pertinent test results  Airway Mallampati: III  TM Distance: >3 FB Neck ROM: Full    Dental  (+) Dental Advisory Given   Pulmonary neg pulmonary ROS,    breath sounds clear to auscultation       Cardiovascular hypertension, Pt. on medications and Pt. on home beta blockers + Valvular Problems/Murmurs AS  Rhythm:Regular  1. Left ventricular ejection fraction, by estimation, is 60 to 65%. The  left ventricle has normal function. The left ventricle has no regional  wall motion abnormalities. There is mild left ventricular hypertrophy.  Left ventricular diastolic parameters  are indeterminate.  2. Right ventricular systolic function is normal. The right ventricular  size is normal. There is mildly elevated pulmonary artery systolic  pressure. The estimated right ventricular systolic pressure is 95.6 mmHg.  3. The mitral valve is grossly normal. Trivial mitral valve  regurgitation.  4. The aortic valve is tricuspid. There is moderate calcification of the  aortic valve. Aortic valve regurgitation is not visualized. Moderate  aortic valve stenosis. Aortic valve mean gradient measures 24.0 mmHg.  Aortic valve Vmax measures 3.17 m/s.  Dimentionless index 0.47.  5. The inferior vena cava is normal in size with greater than 50%  respiratory variability, suggesting right atrial pressure of 3 mmHg.    Neuro/Psych negative neurological ROS  negative psych ROS   GI/Hepatic negative GI ROS, Neg liver ROS,   Endo/Other  diabetesHypothyroidism Morbid obesity  Renal/GU Renal diseaseLab Results      Component                Value               Date                      CREATININE               1.11 (H)            05/02/2022                Musculoskeletal  (+) Arthritis ,    Abdominal   Peds  Hematology Lab Results      Component                Value               Date                      WBC                      9.3                 05/02/2022                HGB                      13.4                05/02/2022                HCT                      43.3  05/02/2022                MCV                      92.1                05/02/2022                PLT                      303                 05/02/2022              Anesthesia Other Findings   Reproductive/Obstetrics                            Anesthesia Physical Anesthesia Plan  ASA: 3  Anesthesia Plan: General and Regional   Post-op Pain Management: Regional block*   Induction: Intravenous  PONV Risk Score and Plan: 3 and Ondansetron and Dexamethasone  Airway Management Planned: Oral ETT  Additional Equipment: None  Intra-op Plan:   Post-operative Plan: Extubation in OR  Informed Consent: I have reviewed the patients History and Physical, chart, labs and discussed the procedure including the risks, benefits and alternatives for the proposed anesthesia with the patient or authorized representative who has indicated his/her understanding and acceptance.     Dental advisory given  Plan Discussed with: CRNA  Anesthesia Plan Comments:         Anesthesia Quick Evaluation

## 2022-05-10 NOTE — Anesthesia Procedure Notes (Signed)
Anesthesia Regional Block: Interscalene brachial plexus block   Pre-Anesthetic Checklist: , timeout performed,  Correct Patient, Correct Site, Correct Laterality,  Correct Procedure, Correct Position, site marked,  Risks and benefits discussed,  Surgical consent,  Pre-op evaluation,  At surgeon's request and post-op pain management  Laterality: Right and Upper  Prep: chloraprep       Needles:  Injection technique: Single-shot      Needle Length: 5cm  Needle Gauge: 22     Additional Needles: Arrow StimuQuik ECHO Echogenic Stimulating PNB Needle  Procedures:,,,, ultrasound used (permanent image in chart),,    Narrative:  Start time: 05/10/2022 10:59 AM End time: 05/10/2022 11:04 AM Injection made incrementally with aspirations every 5 mL.  Performed by: Personally  Anesthesiologist: Oleta Mouse, MD

## 2022-05-10 NOTE — Anesthesia Procedure Notes (Signed)
Procedure Name: Intubation Date/Time: 05/10/2022 11:57 AM  Performed by: Gean Maidens, CRNAPre-anesthesia Checklist: Patient identified, Emergency Drugs available, Suction available, Patient being monitored and Timeout performed Patient Re-evaluated:Patient Re-evaluated prior to induction Oxygen Delivery Method: Circle system utilized Preoxygenation: Pre-oxygenation with 100% oxygen Induction Type: IV induction Ventilation: Mask ventilation without difficulty Laryngoscope Size: Glidescope and 3 Grade View: Grade II Tube type: Oral Tube size: 7.0 mm Number of attempts: 1 Airway Equipment and Method: Stylet and Video-laryngoscopy Placement Confirmation: ETT inserted through vocal cords under direct vision, positive ETCO2 and breath sounds checked- equal and bilateral Secured at: 21 cm Tube secured with: Tape Dental Injury: Teeth and Oropharynx as per pre-operative assessment

## 2022-05-10 NOTE — Transfer of Care (Signed)
Immediate Anesthesia Transfer of Care Note  Patient: Natasha Warren  Procedure(s) Performed: REVERSE SHOULDER ARTHROPLASTY (Right: Shoulder)  Patient Location: PACU  Anesthesia Type:General  Level of Consciousness: sedated, patient cooperative and responds to stimulation  Airway & Oxygen Therapy: Patient Spontanous Breathing and Patient connected to face mask oxygen  Post-op Assessment: Report given to RN and Post -op Vital signs reviewed and stable  Post vital signs: Reviewed and stable  Last Vitals:  Vitals Value Taken Time  BP 142/71 05/10/22 1313  Temp    Pulse 88 05/10/22 1315  Resp 13 05/10/22 1315  SpO2 98 % 05/10/22 1315  Vitals shown include unvalidated device data.  Last Pain:  Vitals:   05/10/22 1048  TempSrc: Oral  PainSc: 0-No pain         Complications: No notable events documented.

## 2022-05-11 ENCOUNTER — Encounter (HOSPITAL_COMMUNITY): Payer: Self-pay | Admitting: Orthopaedic Surgery

## 2022-05-11 DIAGNOSIS — M75101 Unspecified rotator cuff tear or rupture of right shoulder, not specified as traumatic: Secondary | ICD-10-CM | POA: Diagnosis not present

## 2022-05-11 LAB — GLUCOSE, CAPILLARY
Glucose-Capillary: 156 mg/dL — ABNORMAL HIGH (ref 70–99)
Glucose-Capillary: 180 mg/dL — ABNORMAL HIGH (ref 70–99)

## 2022-05-11 MED ORDER — OXYCODONE HCL 5 MG PO TABS: ORAL_TABLET | ORAL | 0 refills | Status: AC

## 2022-05-11 MED ORDER — ASPIRIN 81 MG PO CHEW
81.0000 mg | CHEWABLE_TABLET | Freq: Two times a day (BID) | ORAL | 0 refills | Status: AC
Start: 2022-05-11 — End: 2022-06-22

## 2022-05-11 MED ORDER — METHOCARBAMOL 500 MG PO TABS: 500.0000 mg | ORAL_TABLET | Freq: Three times a day (TID) | ORAL | 0 refills | Status: DC | PRN

## 2022-05-11 MED ORDER — ONDANSETRON HCL 4 MG PO TABS: 4.0000 mg | ORAL_TABLET | Freq: Three times a day (TID) | ORAL | 0 refills | Status: AC | PRN

## 2022-05-11 MED ORDER — ENOXAPARIN SODIUM 40 MG/0.4ML IJ SOSY
40.0000 mg | PREFILLED_SYRINGE | INTRAMUSCULAR | Status: DC
  Administered 2022-05-11: 40 mg via SUBCUTANEOUS
  Filled 2022-05-11: qty 0.4

## 2022-05-11 MED ORDER — ACETAMINOPHEN 500 MG PO TABS: 1000.0000 mg | ORAL_TABLET | Freq: Three times a day (TID) | ORAL | 0 refills | Status: AC

## 2022-05-11 NOTE — TOC Transition Note (Signed)
Transition of Care [facility]) - CM/SW Discharge Note  Patient Details  Name: Natasha Warren MRN: [id number] Date of Birth: 06-22-1946  [facility] CM/SW Contact:  Sherie Don, LCSW Phone Number: 05/11/2022, 3:18 PM  Clinical Narrative: [facility] consulted for possible HH/DME needs, but patient does not appear to have any after being assessed by PT and OT. TOC signing off.  Final next level of care: Home/Self Care Barriers to Discharge: No Barriers Identified  Patient Goals and CMS Choice Choice offered to / list presented to : NA  Discharge Plan and Services        DME Arranged: N/A DME Agency: NA  Readmission Risk Interventions     No data to display

## 2022-05-11 NOTE — Evaluation (Signed)
Physical Therapy One Time Evaluation Patient Details Name: Natasha Warren MRN: [id number] DOB: Oct 11, 1945 Today's Date: 05/11/2022  History of Present Illness  This is a 76 year old who has a history of cervical issues, as well as low back issues.  Patient has a past medical history significant for complication of anesthesia, DM, hyperparathyroidism, hypothyroidism, SVT, HTN and is now s/p right reverse shoulder replacement.  Clinical Impression  Patient evaluated by Physical Therapy with no further acute PT needs identified. All education has been completed and the patient has no further questions.  Pt ambulated in hallway with rollator and practiced one step.  Pt required a couple cues for R LE positioning/placement due to nerve block but grip returning so pt able to hold handle. See below for any follow-up Physical Therapy or equipment needs. PT is signing off. Thank you for this referral.        Recommendations for follow up therapy are one component of a multi-disciplinary discharge planning process, led by the attending physician.  Recommendations may be updated based on patient status, additional functional criteria and insurance authorization.  Follow Up Recommendations Follow physician's recommendations for discharge plan and follow up therapies      Assistance Recommended at Discharge PRN  Patient can return home with the following  Help with stairs or ramp for entrance    Equipment Recommendations None recommended by PT  Recommendations for Other Services       Functional Status Assessment Patient has had a recent decline in their functional status and demonstrates the ability to make significant improvements in function in a reasonable and predictable amount of time.     Precautions / Restrictions Precautions Precautions: Shoulder Type of Shoulder Precautions: Can use arm for ADLs, wear sling at night for sleep, can use arm on walker. Shoulder Interventions: Shoulder  sling/immobilizer;For comfort Precaution Booklet Issued: Yes (comment) Required Braces or Orthoses: Sling Restrictions Weight Bearing Restrictions: Yes RUE Weight Bearing: Non weight bearing Other Position/Activity Restrictions: accept for with use of walker      Mobility  Bed Mobility               General bed mobility comments: pt in recliner    Transfers Overall transfer level: Needs assistance Equipment used: Rollator (4 wheels) Transfers: Sit to/from Stand Sit to Stand: Min guard, Supervision           General transfer comment: Min guard for safety. pt used brakes with left hand performing, Currently needs cue/ assist to place hand on handle due to block.    Ambulation/Gait Ambulation/Gait assistance: Min guard, Supervision Gait Distance (Feet): 200 Feet Assistive device: Rollator (4 wheels) Gait Pattern/deviations: Step-through pattern, Decreased stride length, Narrow base of support       General Gait Details: increased lateral leaning and cirucumduction however pt reports back and knee issues, no unsteadiness or LOB observed  Stairs Stairs: Yes Stairs assistance: Min guard Stair Management: Step to pattern, One rail Right, Forwards Number of Stairs: 1 General stair comments: verbal cues for safety and sequence; pt used rail on left and also performed again with rail on right  Wheelchair Mobility    Modified Rankin (Stroke Patients Only)       Balance                                             Pertinent Vitals/Pain Pain Assessment  Pain Assessment: No/denies pain Pt reports mild SOB with activity.  SPO2 97% on room air after performing step.    Home Living Family/patient expects to be discharged to:: Private residence Living Arrangements: Alone Available Help at Discharge: Family;Available 24 hours/day (for the first two days) Type of Home: House Home Access: Stairs to enter   [employer] of Steps: 1    Home Layout: One level Home Equipment: BSC/3in1;Rollator (4 wheels);Shower seat - built in      Prior Function Prior Level of Function : Independent/Modified Independent             Mobility Comments: uses a rollator - hx of falling ADLs Comments: uses AE - toilet want, typically goes barefoot or     Hand Dominance   Dominant Hand: Right    Extremity/Trunk Assessment   Upper Extremity Assessment Upper Extremity Assessment: RUE deficits/detail RUE Deficits / Details: impaired sensation and motor control secondary to block LUE Deficits / Details: decreased shoulder ROM otherwise functional ROM and strength LUE Sensation: WNL LUE Coordination: WNL    Lower Extremity Assessment Lower Extremity Assessment: Generalized weakness    Cervical / Trunk Assessment Cervical / Trunk Assessment: Kyphotic  Communication   Communication: No difficulties  Cognition Arousal/Alertness: Awake/alert Behavior During Therapy: WFL for tasks assessed/performed Overall Cognitive Status: Within Functional Limits for tasks assessed                                          General Comments      Exercises     Assessment/Plan    PT Assessment All further PT needs can be met in the next venue of care  PT Problem List Decreased strength;Decreased range of motion       PT Treatment Interventions      PT Goals (Current goals can be found in the Care Plan section)  Acute Rehab PT Goals PT Goal Formulation: All assessment and education complete, DC therapy    Frequency       Co-evaluation               AM-PAC PT "6 Clicks" Mobility  Outcome Measure Help needed turning from your back to your side while in a flat bed without using bedrails?: A Little Help needed moving from lying on your back to sitting on the side of a flat bed without using bedrails?: A Little Help needed moving to and from a bed to a chair (including a wheelchair)?: A Little Help needed  standing up from a chair using your arms (e.g., wheelchair or bedside chair)?: A Little Help needed to walk in hospital room?: A Little Help needed climbing 3-5 steps with a railing? : A Little 6 Click Score: 18    End of Session Equipment Utilized During Treatment: Gait belt Activity Tolerance: Patient tolerated treatment well Patient left: in chair;with call bell/phone within reach;with family/visitor present Nurse Communication: Mobility status PT Visit Diagnosis: Difficulty in walking, not elsewhere classified (R26.2)    Time: 1660-6301 PT Time Calculation (min) (ACUTE ONLY): 34 min   Charges:   PT Evaluation $PT Eval Low Complexity: 1 Low PT Treatments $Gait Training: 8-22 mins      Jannette Spanner PT, DPT Physical Therapist Acute Rehabilitation Services Preferred contact method: Secure Chat Weekend Pager Only: [phone] Office: [place] 05/11/2022, 1:28 PM

## 2022-05-11 NOTE — Evaluation (Signed)
Occupational Therapy Evaluation Patient Details Name: Natasha Warren MRN: [id number] DOB: May 21, 1946 Today's Date: 05/11/2022   History of Present Illness This is a 76 year old who has a history of cervical issues, as well as low back issues.  Patient has a past medical history significant for complication of anesthesia, DM, hyperparathyroidism, hypothyroidism, SVT, HTN and is now s/p right reverse shoulder replacement.   Clinical Impression   Natasha Warren is a 76 year old woman s/p shoulder replacement without functional use of right dominant upper extremity secondary to effects of surgery and interscalene block and shoulder precautions. Therapist provided education and instruction to patient and daughter in regards to exercises, precautions, positioning, donning upper extremity clothing and bathing while maintaining shoulder precautions, ice and edema management with cuff and cooler and donning/doffing sling. Extensive in time in room for two trips to the bathroom, dressing, donning and dofing sling and cuff with patient's daughter to improve competence and confidence. Patient and spouse verbalized understanding and demonstrated as needed. Patient needed assistance to donn shirt, underwear, and pants, and provided with instruction on compensatory strategies to perform ADLs. Patient is allowed to use arm on rollator and currently needs assist to put hand on walker. Able to ambulate in room with walker and perform most aspects of ADLs. Okay to discharge home with family assistance - they have plants for family and caregivers for the the first several days. Patient really wanting to go home. Once arm is awake and controllable should also improve. Will follow while patient is here acutely. Patient to follow up with MD for further therapy needs.        Recommendations for follow up therapy are one component of a multi-disciplinary discharge planning process, led by the attending physician.   Recommendations may be updated based on patient status, additional functional criteria and insurance authorization.   Follow Up Recommendations  Follow physician's recommendations for discharge plan and follow up therapies    Assistance Recommended at Discharge Intermittent Supervision/Assistance  Patient can return home with the following A little help with walking and/or transfers;A little help with bathing/dressing/bathroom;Assistance with cooking/housework;Help with stairs or ramp for entrance    Functional Status Assessment  Patient has had a recent decline in their functional status and demonstrates the ability to make significant improvements in function in a reasonable and predictable amount of time.  Equipment Recommendations  None recommended by OT    Recommendations for Other Services       Precautions / Restrictions Precautions Precautions: Shoulder Type of Shoulder Precautions: Can use arm for ADLs, wear sling at night for sleep, can use arm on walker. Shoulder Interventions: Shoulder sling/immobilizer;For comfort (at night when sleeping) Precaution Booklet Issued: Yes (comment) Required Braces or Orthoses: Sling Restrictions Weight Bearing Restrictions: Yes RUE Weight Bearing: Non weight bearing Other Position/Activity Restrictions: accept for with use of walker      Mobility Bed Mobility Overal bed mobility: Needs Assistance Bed Mobility: Supine to Sit     Supine to sit: Min guard     General bed mobility comments: needed bed rail    Transfers   Equipment used: Rollator (4 wheels)               General transfer comment: Min guard to ambulate with rollator. Currently needs assist to place hand on handle due to block.      Balance Overall balance assessment: Mild deficits observed, not formally tested  ADL either performed or assessed with clinical judgement   ADL Overall ADL's : Needs  assistance/impaired Eating/Feeding: Set up   Grooming: Set up   Upper Body Bathing: Moderate assistance   Lower Body Bathing: Minimal assistance   Upper Body Dressing : Maximal assistance   Lower Body Dressing: Minimal assistance   Toilet Transfer: Min guard;Rollator (4 wheels)   Toileting- Clothing Manipulation and Hygiene: Minimal assistance Toileting - Clothing Manipulation Details (indicate cue type and reason): for perianal care. Has toilet want to assist with toileting but dificulty due to IV placement in left hand     Functional mobility during ADLs: Min guard;Rollator (4 wheels)       Vision Patient Visual Report: No change from baseline       Perception     Praxis      Pertinent Vitals/Pain Pain Assessment Pain Assessment: No/denies pain     Hand Dominance Right   Extremity/Trunk Assessment Upper Extremity Assessment Upper Extremity Assessment: RUE deficits/detail RUE Deficits / Details: impaired sensation and motor control secondary to block LUE Deficits / Details: decreased shoulder ROM otherwise functional ROM and strength LUE Sensation: WNL LUE Coordination: WNL   Lower Extremity Assessment Lower Extremity Assessment: Defer to PT evaluation   Cervical / Trunk Assessment Cervical / Trunk Assessment: Kyphotic   Communication Communication Communication: No difficulties   Cognition Arousal/Alertness: Awake/alert Behavior During Therapy: WFL for tasks assessed/performed Overall Cognitive Status: Within Functional Limits for tasks assessed                                       General Comments       Exercises     Shoulder Instructions Shoulder Instructions Donning/doffing shirt without moving shoulder: Patient able to independently direct caregiver;Caregiver independent with task Method for sponge bathing under operated UE: Patient able to independently direct caregiver;Caregiver independent with task Donning/doffing  sling/immobilizer: Caregiver independent with task Correct positioning of sling/immobilizer: Patient able to independently direct caregiver;Caregiver independent with task ROM for elbow, wrist and digits of operated UE: Caregiver independent with task Sling wearing schedule (on at all times/off for ADL's): Patient able to independently direct caregiver;Caregiver independent with task Proper positioning of operated UE when showering: Patient able to independently direct caregiver;Caregiver independent with task Dressing change: Patient able to independently direct caregiver;Caregiver independent with task Positioning of UE while sleeping: Patient able to independently direct caregiver;Caregiver independent with task    Home Living Family/patient expects to be discharged to:: Private residence Living Arrangements: Alone Available Help at Discharge: Family;Available 24 hours/day (for the first two days) Type of Home: House Home Access: Stairs to enter [employer] of Steps: 1   Home Layout: One level     Bathroom Shower/Tub: Occupational psychologist: Standard     Home Equipment: BSC/3in1;Rollator (4 wheels);Shower seat - built in          Prior Functioning/Environment Prior Level of Function : Independent/Modified Independent             Mobility Comments: uses a rollator - hx of falling ADLs Comments: uses AE - toilet want, typically goes barefoot or        OT Problem List: Decreased strength;Decreased range of motion;Impaired UE functional use;Pain;Obesity;Impaired balance (sitting and/or standing)      OT Treatment/Interventions: Self-care/ADL training;Therapeutic exercise;Therapeutic activities;Patient/family education    OT Goals(Current goals can be found in the care plan section)  Acute Rehab OT Goals Patient Stated Goal: to go home at discharge OT Goal Formulation: With patient Time For Goal Achievement: 05/25/22 Potential to Achieve Goals:  Good  OT Frequency: Min 1X/week    Co-evaluation              AM-PAC OT "6 Clicks" Daily Activity     Outcome Measure Help from another person eating meals?: A Little Help from another person taking care of personal grooming?: A Little Help from another person toileting, which includes using toliet, bedpan, or urinal?: A Little Help from another person bathing (including washing, rinsing, drying)?: A Little Help from another person to put on and taking off regular upper body clothing?: A Lot Help from another person to put on and taking off regular lower body clothing?: A Little 6 Click Score: 17   End of Session Equipment Utilized During Treatment: Rollator (4 wheels) Nurse Communication:  (OT education complete.)  Activity Tolerance: Patient tolerated treatment well Patient left: in chair;with call bell/phone within reach;with family/visitor present  OT Visit Diagnosis: Muscle weakness (generalized) (M62.81)                Time: 1470-9295 OT Time Calculation (min): 93 min Charges:  OT General Charges $OT Visit: 1 Visit OT Evaluation $OT Eval Low Complexity: 1 Low OT Treatments $Self Care/Home Management : 68-82 mins  Nikeisha Klutz, OTR/L [place]  Office [phone] Pager: [phone]   Lenward Chancellor 05/11/2022, 11:29 AM

## 2022-05-11 NOTE — Progress Notes (Signed)
   ORTHOPAEDIC PROGRESS NOTE  s/p Procedure(s): REVERSE SHOULDER ARTHROPLASTY  SUBJECTIVE: Reports mild pain about operative site. Nerve block is still working. She has walked to the bathroom this morning.  No chest pain. No SOB. No nausea/vomiting. No other complaints.  OBJECTIVE: PE: General: sitting up in hospital bed, NAD RUE: Dressing CDI and sling well fitting, full and painless ROM throughout hand with DPC of 0.  Axillary nerve sensation/motor altered in setting of block and unable to be fully tested.  Distal motor and sensory altered in setting of block. Warm well perfused hand.    Vitals:   05/10/22 2120 05/11/22 0145  BP: 124/71 133/80  Pulse: 80 81  Resp: 17 17  Temp: 98.1 F (36.7 C) 98 F (36.7 C)  SpO2: 93% 97%   Stable post-op images.   ASSESSMENT: Natasha Warren is a 76 y.o. female doing well postoperatively. POD#1  PLAN: Weightbearing:  - RUE: NWB in sling - okay to remove sling and use right arm to weight bear with her walker - okay for passive/active ROM elbow, wrist, hand - no ROM of the shoulder at this time Insicional and dressing care: Reinforce dressings as needed Orthopedic device(s):  Sling Showering: Post-op day #2 with assistance  VTE prophylaxis: Lovenox while inpatient. Transition to Aspirin outpatient Pain control: PRN pain medications, minimize narcotics as able Follow - up plan: 1 week in office Dispo: PT/OT evals today. There was some concern from her family about their ability to take care of her post-operatively. She may benefit from a SNF. We will see how she does today ambulating with therapy.   Contact information:  Weekdays 8-5 Dr. Ophelia Charter, Noemi Chapel PA-C, After hours and holidays please check [email or web address] for group call information for [facility]   Noemi Chapel, PA-C 05/11/22

## 2022-05-11 NOTE — Op Note (Signed)
Orthopaedic Surgery Operative Note (CSN: [id number])  Natasha Warren  12/14/1945 Date of Surgery: 05/10/2022   Diagnoses:  Right shoulder irreparable rotator cuff tear  Procedure: Right reverse total Shoulder Arthroplasty   Operative Finding Successful completion of planned procedure.  We had to use implants that were slightly adjusted in position to allow the patient to use a walker early.  Additionally we placed her implants in slightly tighter than normal to try and avoid instability.  Patient's body habitus and BMI puts her at higher than normal risk of instability.  We do worry about her pain control after surgery as she has had a history of chronic pain medication use.  X-ray nerve tug test was normal, stability was good.  Post-operative plan: The patient will be using a sling while sleeping but can use her arm for ADLs as well as ambulation with a walker right away.  The patient will be will be admitted for mobilization, medical optimization and possibly the need for short term rehabilitation in an inpatient setting due to medical issues and fragility fracture.  DVT prophylaxis Aspirin 81 mg twice daily for 6 weeks.  Pain control with PRN pain medication preferring oral medicines.  Follow up plan will be scheduled in approximately 7 days for incision check and XR.  Physical therapy to start immediately.  Implants: Tornier size 3B flex stem, 0 high offset tray with a +6 polyethylene, 36 standard glenosphere with a 25 standard baseplate with 30 center screw.  Post-Op Diagnosis: Same Surgeons:Primary: Hiram Gash, MD Assistants:Caroline McBane PA-C Location: [facility] Anesthesia: General with Exparel Interscalene Antibiotics: Ancef 2g preop, Vancomycin '1000mg'$  locally Tourniquet time: None Estimated Blood Loss: 174 Complications: None Specimens: None Implants: Implant Name Type Inv. Item Serial No. Manufacturer Lot No. LRB No. Used Action  BASEPLATE GLENOSPHERE 25 STD -  [id number] Miscellaneous BASEPLATE GLENOSPHERE 25 STD  [employer] [id number] Right 1 Implanted  GLENOSPHERE REV SHOULDER 36 - [id number] Joint GLENOSPHERE REV SHOULDER 36  [employer] [id number] Right 1 Implanted  SCREW PERIPHERAL 5.0X34 - [id number] Screw SCREW PERIPHERAL 5.0X34  [employer]  Right 1 Implanted  SCREW 5.0X18 - [id number] Screw SCREW 5.0X18  [employer]  Right 1 Implanted  SCREW BONE 6.5 OD 30 NON BIO - [id number] Screw SCREW BONE 6.5 OD 30 NON BIO  [employer]  Right 1 Implanted  INSERT HUMERAL 36X6MM 12.5DEG - [id number] Insert INSERT HUMERAL 36X6MM 12.[employer] [id number] Right 1 Implanted  HUMERAL STEM AEQUALIS 3BX74MM - [id number] Stem HUMERAL STEM AEQUALIS 3BX74MM  [employer] [id number] Right 1 Implanted  IMPLANT REVERSE SHOULDER 0X3.5 - [id number] Shoulder IMPLANT REVERSE SHOULDER 0X3.[place] [id number] Right 1 Implanted    Indications for Surgery:   Natasha Warren is a 76 y.o. female with irreparable rotator cuff tear and mobility issues requiring a walker.  Benefits and risks of operative and nonoperative management were discussed prior to surgery with patient/guardian(s) and informed consent form was completed.  Infection and need for further surgery were discussed as was prosthetic stability and cuff issues.  We additionally specifically discussed risks of axillary nerve injury, infection, periprosthetic fracture, continued pain and longevity of implants prior to beginning procedure.      Procedure:   The patient was identified in the preoperative holding area where the surgical site was marked. Block placed by anesthesia with exparel.  The patient was taken to the OR where a procedural timeout was called and the above noted anesthesia was induced.  The patient was positioned beachchair on allen table with spider arm positioner.  Preoperative antibiotics were dosed.  The patient's right shoulder was prepped and draped in the usual sterile fashion.  A second  preoperative timeout was called.       Standard deltopectoral approach was performed with a #10 blade. We dissected down to the subcutaneous tissues and the cephalic vein was taken laterally with the deltoid. Clavipectoral fascia was incised in line with the incision. Deep retractors were placed. The long of the biceps tendon was identified and there was significant tenosynovitis present.  Tenodesis was performed to the pectoralis tendon with #2 Ethibond. The remaining biceps was followed up into the rotator interval where it was released.   The subscapularis was taken down in a full thickness layer with capsule along the humeral neck extending inferiorly around the humeral head. We continued releasing the capsule directly off of the osteophytes inferiorly all the way around the corner. This allowed [place] to dislocate the humeral head.    The rotator cuff was carefully examined and noted to be irreperably torn.  The decision was confirmed that a reverse total shoulder was indicated for this patient.  There were osteophytes along the inferior humeral neck. The osteophytes were removed with an osteotome and a rongeur.  Osteophytes were removed with a rongeur and an osteotome and the anatomic neck was well visualized.     A humeral cutting guide was inserted down the intramedullary canal. The version was set at 20 of retroversion. Humeral osteotomy was performed with an oscillating saw. The head fragment was passed off the back table. A starter awl was used to open the humeral canal. We next used T-handle straight sound reamers to ream up to an appropriate fit. A chisel was used to remove proximal humeral bone. We then broached starting with a size one broach and broaching up to 3 which obtained an appropriate fit. The broach handle was removed. A cut protector was placed. The broach handle was removed and a cut protector was placed. The humerus was retracted posteriorly and we turned our attention to glenoid  exposure.  The subscapularis was again identified and immediately we took care to palpate the axillary nerve anteriorly and verify its position with gentle palpation as well as the tug test.  We then released the SGHL with bovie cautery prior to placing a curved mayo at the junction of the anterior glenoid well above the axillary nerve and bluntly dissecting the subscapularis from the capsule.  We then carefully protected the axillary nerve as we gently released the inferior capsule to fully mobilize the subscapularis.  An anterior deltoid retractor was then placed as well as a small Hohmann retractor superiorly.   The glenoid was relatively intact in the setting of an irreparable cuff tear  The remaining labrum was removed circumferentially taking great care not to disrupt the posterior capsule.   The glenoid drill guide was placed and used to drill a guide pin in the center, inferior position. The glenoid face was then reamed concentrically over the guide wire. The center hole was drilled over the guidepin in a near anatomic angle of version. Next the glenoid vault was drilled back to a depth of 30 mm.  We tapped and then placed a 31m size baseplate with additional 035mlateralization was selected with a 6.5 mm x 30 mm length central screw.  The base plate was screwed into the glenoid vault obtaining secure fixation. We next placed superior and inferior  locking screws for additional fixation.  Next a 36 mm glenosphere was selected and impacted onto the baseplate. The center screw was tightened.  We turned attention back to the humeral side. The cut protector was removed. We trialed with multiple size tray and polyethylene options and selected a 6 which provided good stability and range of motion without excess soft tissue tension. The offset was dialed in to match the normal anatomy. The shoulder was trialed.  There was good ROM in all planes and the shoulder was stable with no inferior  translation.  The real humeral implants were opened after again confirming sizes.  The trial was removed. #5 Fiberwire x4 sutures passed through the humeral neck for subscap repair. The humeral component was press-fit obtaining a secure fit. A +0 high offset tray was selected and impacted onto the stem.  A 36+6 polyethylene liner was impacted onto the stem.  The joint was reduced and thoroughly irrigated with pulsatile lavage. Subscap was repaired back with #5 Fiberwire sutures through bone tunnels. Hemostasis was obtained. The deltopectoral interval was reapproximated with #1 Ethibond. The subcutaneous tissues were closed with 2-0 Vicryl and the skin was closed with running monocryl.    The wounds were cleaned and dried and an Aquacel dressing was placed. The drapes taken down. The arm was placed into sling with abduction pillow. Patient was awakened, extubated, and transferred to the recovery room in stable condition. There were no intraoperative complications. The sponge, needle, and attention counts were  correct at the end of the case.       Noemi Chapel, PA-C, present and scrubbed throughout the case, critical for completion in a timely fashion, and for retraction, instrumentation, closure.

## 2022-05-11 NOTE — Plan of Care (Signed)
  Problem: Activity: Goal: Ability to tolerate increased activity will improve Outcome: Progressing   Problem: Pain Management: Goal: Pain level will decrease with appropriate interventions Outcome: Progressing   Problem: Coping: Goal: Ability to adjust to condition or change in health will improve Outcome: Progressing   Problem: Safety: Goal: Ability to remain free from injury will improve Outcome: Progressing

## 2022-05-11 NOTE — Discharge Instructions (Addendum)
Ophelia Charter MD, MPH Noemi Chapel, PA-C [place] [address] [phone] (tel)   [phone] (fax)   [place] may leave the operative dressing in place until your follow-up appointment. KEEP THE INCISIONS CLEAN AND DRY. There may be a small amount of fluid/bleeding leaking at the surgical site. This is normal after surgery.  If it fills with liquid or blood please call us immediately to change it for you. Use the provided ice machine or Ice packs as often as possible for the first 3-4 days, then as needed for pain relief.   Keep a layer of cloth or a shirt between your skin and the cooling unit to prevent frost bite as it can get very cold.  SHOWERING: - You may shower on Post-Op Day #2.  - The dressing is water resistant but do not scrub it as it may start to peel up.   - You may remove the sling for showering - Gently pat the area dry.  - Do not soak the shoulder in water.  - Do not go swimming in the pool or ocean until your incision has completely healed (about 4-6 weeks after surgery) - KEEP THE INCISIONS CLEAN AND DRY.  EXERCISES Wear the sling at all times  You may remove the sling for showering, but keep the arm across the chest or in a secondary sling.    Accidental/Purposeful External Rotation and shoulder flexion (reaching behind you) is to be avoided at all costs for the first month. It is ok to come out of your sling if your are sitting and have assistance for eating.   Do not lift anything heavier than 1 pound until we discuss it further in clinic.  It is normal for your fingers/hand to become more swollen after surgery and discolored from bruising.   This will resolve over the first few weeks usually after surgery. Please continue to ambulate and do not stay sitting or lying for too long.  Perform foot and wrist pumps to assist in circulation.  PHYSICAL  THERAPY - You will begin physical therapy soon after surgery (unless otherwise specified) - Please call to set up an appointment, if you do not already have one  - Let our office if there are any issues with scheduling your therapy  - You have a physical therapy appointment scheduled at [facility] (across the hall from our office) on Monday August 28th at 1 pm   [place] ([place]) The anesthesia team may have performed a nerve block for you this is a great tool used to minimize pain.   The block may start wearing off overnight (between 8-24 hours postop) When the block wears off, your pain may go from nearly zero to the pain you would have had postop without the block. This is an abrupt transition but nothing dangerous is happening.   This can be a challenging period but utilize your as needed pain medications to try and manage this period. We suggest you use the pain medication the first night prior to going to bed, to ease this transition.  You may take an extra dose of narcotic when this happens if needed   POST-OP MEDICATIONS- Multimodal approach to pain control In general your pain will be controlled with a combination of substances.  Prescriptions unless otherwise discussed are electronically sent to your pharmacy.  This is a carefully made plan we use to minimize  narcotic use.     Acetaminophen - Non-narcotic pain medicine taken on a scheduled basis  Oxycodone - This is a strong narcotic, to be used only on an "as needed" basis for SEVERE pain. Take 1-2 tablets every 6 hours as needed for severe pain This is for the first week after surgery Afterwards resume your normal daily pain prescription Aspirin '81mg'$  - This medicine is used to minimize the risk of blood clots after surgery. Zofran -  take as needed for nausea Robaxin - this is a muscle relaxer, take as needed for muscle spasms  FOLLOW-UP If you develop a Fever (>101.5), Redness or Drainage from the surgical  incision site, please call our office to arrange for an evaluation. Please call the office to schedule a follow-up appointment for a wound check, 7-10 days post-operatively.  IF YOU HAVE ANY QUESTIONS, PLEASE FEEL FREE TO CALL OUR OFFICE.  HELPFUL INFORMATION  Your arm will be in a sling following surgery. You will be in this sling for the next 4 weeks.   You may be more comfortable sleeping in a semi-seated position the first few nights following surgery.  Keep a pillow propped under the elbow and forearm for comfort.  If you have a recliner type of chair it might be beneficial.  If not that is fine too, but it would be helpful to sleep propped up with pillows behind your operated shoulder as well under your elbow and forearm.  This will reduce pulling on the suture lines.  When dressing, put your operative arm in the sleeve first.  When getting undressed, take your operative arm out last.  Loose fitting, button-down shirts are recommended.  In most states it is against the law to drive while your arm is in a sling. And certainly against the law to drive while taking narcotics.  You may return to work/school in the next couple of days when you feel up to it. Desk work and typing in the sling is fine.  We suggest you use the pain medication the first night prior to going to bed, in order to ease any pain when the anesthesia wears off. You should avoid taking pain medications on an empty stomach as it will make you nauseous.  You should wean off your narcotic medicines as soon as you are able.     Most patients will be off or using minimal narcotics before their first postop appointment.   Do not drink alcoholic beverages or take illicit drugs when taking pain medications.  Pain medication may make you constipated.  Below are a few solutions to try in this order: Decrease the amount of pain medication if you aren't having pain. Drink lots of decaffeinated fluids. Drink prune juice and/or  each dried prunes  If the first 3 don't work start with additional solutions Take Colace - an over-the-counter stool softener Take Senokot - an over-the-counter laxative Take Miralax - a stronger over-the-counter laxative   Dental Antibiotics:  In most cases prophylactic antibiotics for Dental procdeures after total joint surgery are not necessary.  Exceptions are as follows:  1. History of prior total joint infection  2. Severely immunocompromised (Organ Transplant, cancer chemotherapy, Rheumatoid biologic meds such as [place])  3. Poorly controlled diabetes (A1C &gt; 8.0, blood glucose over 200)  If you have one of these conditions, contact your surgeon for an antibiotic prescription, prior to your dental procedure.   For more information including helpful videos and documents visit our website:   [email or web address]

## 2022-05-11 NOTE — Anesthesia Postprocedure Evaluation (Signed)
Anesthesia Post Note  Patient: Natasha Warren  Procedure(s) Performed: REVERSE SHOULDER ARTHROPLASTY (Right: Shoulder)     Patient location during evaluation: PACU Anesthesia Type: Regional and General Level of consciousness: awake and alert Pain management: pain level controlled Vital Signs Assessment: post-procedure vital signs reviewed and stable Respiratory status: spontaneous breathing, nonlabored ventilation and respiratory function stable Cardiovascular status: blood pressure returned to baseline and stable Postop Assessment: no apparent nausea or vomiting Anesthetic complications: no   No notable events documented.  Last Vitals:  Vitals:   05/11/22 1039 05/11/22 1322  BP: 103/63 113/66  Pulse: 68 97  Resp: 18 18  Temp: 36.8 C 36.7 C  SpO2: 93% 95%    Last Pain:  Vitals:   05/11/22 0810  TempSrc:   PainSc: 2                  Slyvester Latona

## 2022-05-12 NOTE — Discharge Summary (Signed)
Patient ID: ELLAN TESS MRN: [id number] DOB/AGE: 1946-04-07 76 y.o.  Admit date: 05/10/2022 Discharge date: 05/11/2022  Admission Diagnoses: Right shoulder irreparable rotator cuff tear  Discharge Diagnoses:  Principal Problem:   Status post reverse total arthroplasty of right shoulder   Past Medical History:  Diagnosis Date   Arthritis    Bulging lumbar disc    Colon polyps    Complication of anesthesia    hard to wake after back surgery, block for rotator cuff 'didn't work'   Diabetes mellitus without complication ([place])    History of gout    History of gout    Hyperlipidemia    Hyperparathyroidism, primary ([place])    Hypertension    Hypothyroidism    Irritable bowel syndrome    Nocturia    Renal disorder    kidney stones   Seasonal allergies    SVT (supraventricular tachycardia) ([place])    Thyroid disorder    Urolithiasis     Procedures Performed: Right reverse total Shoulder Arthroplasty  Discharged Condition: stable  Hospital Course: Patient brought in to [facility] for scheduled procedure. She tolerated procedure well.  She was kept for monitoring overnight for pain control, medical monitoring postop, and PT/OT. She was found to be stable for DC home the morning after surgery. Her pain was controlled. She passed PT and OT. Her family felt comfortable with discharge home. Patient was instructed on specific activity restrictions and all questions were answered.  Consults: PT/OT  Significant Diagnostic Studies: No additional pertinent studies  Treatments: Surgery  Discharge Exam: General: sitting up in hospital bed, NAD RUE: Dressing CDI and sling well fitting, full and painless ROM throughout hand with DPC of 0.  Axillary nerve sensation/motor altered in setting of block and unable to be fully tested.  Distal motor and sensory altered in setting of block. Warm well perfused hand.   Disposition: Discharge disposition: 01-Home or Self  Care       Discharge Instructions     Call MD for:  redness, tenderness, or signs of infection (pain, swelling, redness, odor or green/yellow discharge around incision site)   Complete by: As directed    Call MD for:  severe uncontrolled pain   Complete by: As directed    Call MD for:  temperature >100.4   Complete by: As directed    Diet - low sodium heart healthy   Complete by: As directed       Allergies as of 05/11/2022       Reactions   Ace Inhibitors Other (See Comments)   Patient doesn't remember this allergy.  On 12/30/2014 received LOV visit from PCP , Dr Rory Percy in [place], [place].  - No known active drug allergies listed in his office visit note dated 05/21/2014.     Venlafaxine Anxiety        Medication List     STOP taking these medications    aspirin EC 81 MG tablet Replaced by: aspirin 81 MG chewable tablet   oxyCODONE-acetaminophen 5-325 MG tablet Commonly known as: PERCOCET/ROXICET   tizanidine 2 MG capsule Commonly known as: ZANAFLEX       TAKE these medications    acetaminophen 500 MG tablet Commonly known as: TYLENOL Take 2 tablets (1,000 mg total) by mouth every 8 (eight) hours for 14 days. What changed:  when to take this reasons to take this   allopurinol 300 MG tablet Commonly known as: ZYLOPRIM Take 300 mg by mouth daily.   aspirin  81 MG chewable tablet Commonly known as: Aspirin Childrens Chew 1 tablet (81 mg total) by mouth 2 (two) times daily. For 6 weeks for DVT prophylaxis after surgery Replaces: aspirin EC 81 MG tablet   atorvastatin 20 MG tablet Commonly known as: LIPITOR Take 20 mg by mouth daily.   gabapentin 300 MG capsule Commonly known as: NEURONTIN Take 300 mg by mouth at bedtime.   levothyroxine 50 MCG tablet Commonly known as: SYNTHROID Take 50 mcg by mouth daily before breakfast.   losartan 100 MG tablet Commonly known as: COZAAR Take 100 mg by mouth daily.   metFORMIN 500 MG tablet Commonly known as:  GLUCOPHAGE Take 500 mg by mouth daily.   methocarbamol 500 MG tablet Commonly known as: ROBAXIN Take 1 tablet (500 mg total) by mouth every 8 (eight) hours as needed for muscle spasms.   metoprolol succinate 50 MG 24 hr tablet Commonly known as: TOPROL-XL Take 1 & 1/2 tablets daily.   ondansetron 4 MG tablet Commonly known as: Zofran Take 1 tablet (4 mg total) by mouth every 8 (eight) hours as needed for up to 7 days for nausea or vomiting.   oxyCODONE 5 MG immediate release tablet Commonly known as: Oxy IR/ROXICODONE Take 1-2 pills every 6 hrs as needed for severe pain, no more than 6 per day   polyethylene glycol 17 g packet Commonly known as: MIRALAX / GLYCOLAX Take 17 g by mouth daily.   Potassium Citrate 15 MEQ (1620 MG) Tbcr TAKE 1 TABLET BY MOUTH THREE TIMES DAILY   Vitamin D3 125 MCG (5000 UT) Caps Take 5,000 Units by mouth daily.   zinc gluconate 50 MG tablet Take 50 mg by mouth daily.        Noemi Chapel, PA-C

## 2022-07-06 ENCOUNTER — Encounter: Payer: Self-pay | Admitting: Cardiology

## 2022-07-06 ENCOUNTER — Ambulatory Visit: Payer: Medicare Other | Attending: Cardiology | Admitting: Cardiology

## 2022-07-06 VITALS — BP 124/80 | HR 68 | Ht 63.0 in | Wt 229.8 lb

## 2022-07-06 DIAGNOSIS — I350 Nonrheumatic aortic (valve) stenosis: Secondary | ICD-10-CM | POA: Insufficient documentation

## 2022-07-06 DIAGNOSIS — I471 Supraventricular tachycardia, unspecified: Secondary | ICD-10-CM

## 2022-07-06 DIAGNOSIS — I10 Essential (primary) hypertension: Secondary | ICD-10-CM | POA: Insufficient documentation

## 2022-07-06 DIAGNOSIS — E782 Mixed hyperlipidemia: Secondary | ICD-10-CM

## 2022-07-06 NOTE — Progress Notes (Signed)
Clinical Summary Ms. Colclasure is a 76 y.o.female seen today for follow up of the following medical problems.    1. PSVT - no recent palpitaitons, continues to do very well on beta blocker   -no recent palpitations - compliant with meds     2. Prior Chest pain/MSK pain - history of inermittent chest dull pain 2-3/10. At rest or with exertion, often with laying down. Can occur after meals. Lasts approx 15-20 minutes. No SOB or palpitations. Ongoing for 1 year, symptoms unchanged since last visit - no recent symptoms.    - seen in ER 03/29/17 with chest pain, primarily left scapular pain radiating into chest. From notes thought to be MSK pain.EKG and cardiac enzymes unremarkable.  Discharged with meloxicam.   - no recent chest pain since last visit.   01-20-18 ER visit with chest pain. Notes indicate worst with palpation. EKG nonacute, enzymes negative. Symptoms typical of her prior left chest/shoulder blade pain.  - followed up with Dr Carloyn Manner, had MRI showed bone spurs in shoulder blade - symptoms have resolved.    - has not had recent symptoms.     3. Aortic stenosis - 04/2021 echo LVEF 60-65%, moderate AS mean grad 24 AVA VTI 1.35 - no chest pain, no SOB/DOE   4. Chronic back pain - multiple prior surgeries   5. Shoulder surgery 04/2022, replacement  6. HTN - compliant with meds  7. Hyperlipidemia - labs followed by pcp - compliant with meds      SH: Her husband AVAIYAH STRUBEL is also a patient of mine, he recently passed away 01/20/2017. Had been married 24 years. Has 2 daughters in the area. 3 grandkids. Past Medical History:  Diagnosis Date   Arthritis    Bulging lumbar disc    Colon polyps    Complication of anesthesia    hard to wake after back surgery, block for rotator cuff 'didn't work'   Diabetes mellitus without complication ([place])    History of gout    History of gout    Hyperlipidemia    Hyperparathyroidism, primary ([place])    Hypertension     Hypothyroidism    Irritable bowel syndrome    Nocturia    Renal disorder    kidney stones   Seasonal allergies    SVT (supraventricular tachycardia) ([facility])    Thyroid disorder    Urolithiasis      Allergies  Allergen Reactions   Ace Inhibitors Other (See Comments)    Patient doesn't remember this allergy.  On 12/30/2014 received LOV visit from PCP , Dr Rory Percy in [place], [place].  - No known active drug allergies listed in his office visit note dated 05/21/2014.     Venlafaxine Anxiety     Current Outpatient Medications  Medication Sig Dispense Refill   allopurinol (ZYLOPRIM) 300 MG tablet Take 300 mg by mouth daily.   5   atorvastatin (LIPITOR) 20 MG tablet Take 20 mg by mouth daily.     Cholecalciferol (VITAMIN D3) 125 MCG (5000 UT) CAPS Take 5,000 Units by mouth daily.     gabapentin (NEURONTIN) 300 MG capsule Take 300 mg by mouth at bedtime.     levothyroxine (SYNTHROID) 50 MCG tablet Take 50 mcg by mouth daily before breakfast.     losartan (COZAAR) 100 MG tablet Take 100 mg by mouth daily.   1   metFORMIN (GLUCOPHAGE) 500 MG tablet Take 500 mg by mouth daily.  methocarbamol (ROBAXIN) 500 MG tablet Take 1 tablet (500 mg total) by mouth every 8 (eight) hours as needed for muscle spasms. 30 tablet 0   metoprolol succinate (TOPROL-XL) 50 MG 24 hr tablet Take 1 & 1/2 tablets daily. 45 tablet 135   polyethylene glycol (MIRALAX / GLYCOLAX) 17 g packet Take 17 g by mouth daily.     Potassium Citrate 15 MEQ (1620 MG) TBCR TAKE 1 TABLET BY MOUTH THREE TIMES DAILY 90 tablet 3   zinc gluconate 50 MG tablet Take 50 mg by mouth daily.     No current facility-administered medications for this visit.   Facility-Administered Medications Ordered in Other Visits  Medication Dose Route Frequency Provider Last Rate Last Admin   acetaminophen (TYLENOL) tablet 650 mg  650 mg Oral Q4H PRN Irine Seal, MD         Past Surgical History:  Procedure Laterality Date   ABDOMINAL HYSTERECTOMY  [place] SURGERY  09/2016   bladder tack  09/1984   CHOLECYSTECTOMY  01/1999   COLONOSCOPY  06/2006   This was her second one   COLONOSCOPY  07/12/2011   Procedure: COLONOSCOPY;  Surgeon: Rogene Houston, MD;  [facility];  Service: Endoscopy;  Laterality: N/A;  10:30 am   COLONOSCOPY N/A 04/03/2018   Procedure: COLONOSCOPY;  Surgeon: Rogene Houston, MD;  [facility];  Service: Endoscopy;  Laterality: N/A;  [place] Left 10/03/2014   Procedure: CYSTOSCOPY WITH URETERAL STENT PLACEMENT;  Surgeon: Malka So, MD;  Location: [facility];  Service: Urology;  Laterality: Left;   CYSTOSCOPY WITH URETEROSCOPY AND STENT PLACEMENT Left 03/16/2015   Procedure: CYSTOSCOPY WITH LEFT STENT PLACEMENT;  Surgeon: Franchot Gallo, MD;  Location: [facility];  Service: Urology;  Laterality: Left;   gout removal  2017   HOLMIUM LASER APPLICATION Left 09/81/1914   Procedure: HOLMIUM LASER APPLICATION;  Surgeon: Franchot Gallo, MD;  [facility];  Service: Urology;  Laterality: Left;   KNEE ARTHROSCOPY  12/2010   Left knee   LITHOTRIPSY  06/1995   LUMBAR FUSION  06/2016   NEPHROLITHOTOMY Left 01/07/2015   Procedure: NEPHROLITHOTOMY PERCUTANEOUS;  Surgeon: Franchot Gallo, MD;  [facility];  Service: Urology;  Laterality: Left;  With STENT   POLYPECTOMY  04/03/2018   Procedure: POLYPECTOMY;  Surgeon: Rogene Houston, MD;  [facility];  Service: Endoscopy;;  transverse,splenic flexure, sigmoid   REVERSE SHOULDER ARTHROPLASTY Right 05/10/2022   Procedure: REVERSE SHOULDER ARTHROPLASTY;  Surgeon: Hiram Gash, MD;  [facility];  Service: Orthopedics;  Laterality: Right;   Rotor Cuff  2011   Right Shoulder   STENT REMOVAL Left 03/16/2015   Procedure: LEFT URETERAL STENT REMOVAL;  Surgeon: Franchot Gallo, MD;  [facility];  Service: Urology;  Laterality: Left;   THYROID EXPLORATION N/A 07/31/2013    Procedure: neck EXPLORATION;  Surgeon: Odis Hollingshead, MD;  [facility];  Service: General;  Laterality: N/A;   THYROIDECTOMY N/A 07/31/2013   Procedure: PARATHYROIDECTOMY;  Surgeon: Odis Hollingshead, MD;  [facility];  Service: General;  Laterality: N/A;   TUMOR REMOVAL  06/1997   Beign; on back x2 surgeries   TUMOR REMOVAL  2002   "FATTY TUMORS" REMOVED FROM BACK   URETEROSCOPY WITH HOLMIUM LASER LITHOTRIPSY Left 03/16/2015   Procedure: LEFT URETEROSCOPIC LASER LITHOTRIPSY WITH STONE EXTRACTION;  Surgeon: Franchot Gallo, MD;  [facility];  Service: Urology;  Laterality: Left;   WISDOM TOOTH EXTRACTION       Allergies  Allergen Reactions   Ace Inhibitors Other (See Comments)    Patient doesn't remember this allergy.  On 12/30/2014 received LOV visit from PCP , Dr Rory Percy in [place], [place].  - No known active drug allergies listed in his office visit note dated 05/21/2014.     Venlafaxine Anxiety      Family History  Problem Relation Age of Onset   Hypertension Mother    Arthritis Mother    Hypertension Father    Healthy Brother    Healthy Daughter    Healthy Daughter    Colon cancer Neg Hx      Social History Ms. Upshur reports that she has never smoked. She has never used smokeless tobacco. Ms. Brownley reports that she does not currently use alcohol.   Review of Systems CONSTITUTIONAL: No weight loss, fever, chills, weakness or fatigue.  HEENT: Eyes: No visual loss, blurred vision, double vision or yellow sclerae.No hearing loss, sneezing, congestion, runny nose or sore throat.  SKIN: No rash or itching.  CARDIOVASCULAR: per hpi RESPIRATORY: No shortness of breath, cough or sputum.  GASTROINTESTINAL: No anorexia, nausea, vomiting or diarrhea. No abdominal pain or blood.  GENITOURINARY: No burning on urination, no polyuria NEUROLOGICAL: No headache, dizziness, syncope, paralysis, ataxia, numbness or tingling in the extremities. No change in bowel  or bladder control.  MUSCULOSKELETAL: No muscle, back pain, joint pain or stiffness.  LYMPHATICS: No enlarged nodes. No history of splenectomy.  PSYCHIATRIC: No history of depression or anxiety.  ENDOCRINOLOGIC: No reports of sweating, cold or heat intolerance. No polyuria or polydipsia.  Marland Kitchen   Physical Examination Today's Vitals   07/06/22 1133  BP: 124/80  Pulse: 68  SpO2: 96%  Weight: 229 lb 12.8 oz (104.2 kg)  Height: '5\' 3"'$  (1.6 m)   Body mass index is 40.71 kg/m.  Gen: resting comfortably, no acute distress HEENT: no scleral icterus, pupils equal round and reactive, no palptable cervical adenopathy,  CV: RRR, 3/6 systolic murmur rusb, no jvd Resp: Clear to auscultation bilaterally GI: abdomen is soft, non-tender, non-distended, normal bowel sounds, no hepatosplenomegaly MSK: extremities are warm, no edema.  Skin: warm, no rash Neuro:  no focal deficits Psych: appropriate affect   Diagnostic Studies Jan 2017 echo Study Conclusions  - Left ventricle: The cavity size was normal. Wall thickness was   increased in a pattern of mild LVH. Systolic function was normal.   The estimated ejection fraction was in the range of 60% to 65%.   Wall motion was normal; there were no regional wall motion   abnormalities. Doppler parameters are consistent with abnormal   left ventricular relaxation (grade 1 diastolic dysfunction). - Aortic valve: Mildly calcified annulus. - Mitral valve: Calcified annulus. There was mild regurgitation. - Tricuspid valve: There was mild regurgitation.   04/2020 ECHO [facility] Summary    1. The left ventricle is normal in size with mildly increased wall   thickness.    2. The left ventricular systolic function is normal, LVEF is visually   estimated at 65-70%.    3. There is grade I diastolic dysfunction (impaired relaxation).    4. Mitral annular calcification is present.    5. There is moderate aortic valve stenosis.    6. The left atrium is mildly  dilated in size.    7. The right ventricle is normal in size, with normal systolic  function.    8. There is mild pulmonary hypertension, estimated pulmonary artery systolic   pressure is 41 mmHg.         Left Ventricle    The left ventricle is normal in size with mildly increased wall thickness.    The left ventricular systolic function is normal, LVEF is visually estimated   at 65-70%.    There is grade I diastolic dysfunction (impaired relaxation).      Right Ventricle    The right ventricle is normal in size, with normal systolic function.         Left Atrium    The left atrium is mildly dilated in size.      Right Atrium    The right atrium is normal  in size.         Aortic Valve    The aortic valve is trileaflet with mildly thickened leaflets with mildly   reduced excursion.    There is no significant aortic regurgitation.    There is moderate aortic valve stenosis.    Peak AV transvalvular velocity:  2.8 m/s.    Mean gradient: 18 mmHg.    Doppler velocity index: 0.41.    Estimated aortic valve area (VTI): 1.3 cm2.    Estimated aortic valve area (velocity): 1.3 cm2.    LVOT diameter: 2.0 cm.    LV stroke volume index: 33.4 ml/m2.      Pulmonic Valve    The pulmonic valve is normal.    There is no significant pulmonic regurgitation.    There is no evidence of a significant transvalvular gradient.      Mitral Valve    The mitral valve leaflets are normal with normal leaflet mobility.    Mitral annular calcification is present.    There is trivial mitral valve regurgitation.      Tricuspid Valve    The tricuspid valve leaflets are normal, with normal leaflet mobility.    There is mild tricuspid regurgitation.    There is mild pulmonary hypertension, estimated pulmonary artery systolic   pressure is 41 mmHg.         Other Findings    Rhythm: Sinus Rhythm.      Pericardium/Pleural    There is no pericardial effusion.      Inferior Vena Cava    IVC size and  inspiratory change suggest normal right atrial pressure. (0-5   mmHg).      Aorta    The aorta is normal in size in the visualized segments.      04/2020 echo [facility] Summary    1. The left ventricle is normal in size with mildly increased wall  thickness.    2. The left ventricular systolic function is normal, LVEF is visually  estimated at 65-70%.    3. There is grade I diastolic dysfunction (impaired relaxation).    4. Mitral annular calcification is present.    5. There is moderate aortic valve stenosis.    6. The left atrium is mildly dilated in size.    7. The right ventricle is normal in size, with normal systolic function.    8. There is mild pulmonary hypertension, estimated pulmonary artery systolic  pressure is 41 mmHg.        04/2021 echo IMPRESSIONS     1. Left ventricular ejection fraction, by estimation, is 60 to 65%. The  left ventricle has normal function. The left ventricle has no regional  wall motion abnormalities. There is mild left  ventricular hypertrophy.  Left ventricular diastolic parameters  are indeterminate.   2. Right ventricular systolic function is normal. The right ventricular  size is normal. There is mildly elevated pulmonary artery systolic  pressure. The estimated right ventricular systolic pressure is 45.3 mmHg.   3. The mitral valve is grossly normal. Trivial mitral valve  regurgitation.   4. The aortic valve is tricuspid. There is moderate calcification of the  aortic valve. Aortic valve regurgitation is not visualized. Moderate  aortic valve stenosis. Aortic valve mean gradient measures 24.0 mmHg.  Aortic valve Vmax measures 3.17 m/s.  Dimentionless index 0.47.   5. The inferior vena cava is normal in size with greater than 50%  respiratory variability, suggesting right atrial pressure of 3 mmHg.       Assessment and Plan   1. PSVT - no symtoms, continue current meds - EKG today shows NSR   2. Aortic stenosis - moderate by recent  echo,no symptoms - repeat echo 04/2023  3,. HTN  At goal, continue current meds  4. Hyperlipidemia - request pcp labs, continue current meds   F/u 1 year   Arnoldo Lenis, M.D.

## 2022-07-06 NOTE — Patient Instructions (Signed)

## 2022-10-26 ENCOUNTER — Other Ambulatory Visit: Payer: Self-pay | Admitting: Urology

## 2022-10-26 DIAGNOSIS — N200 Calculus of kidney: Secondary | ICD-10-CM

## 2022-11-17 ENCOUNTER — Ambulatory Visit (HOSPITAL_COMMUNITY)
Admission: RE | Admit: 2022-11-17 | Discharge: 2022-11-17 | Disposition: A | Discharge status: Home / Self Care | Payer: Medicare Other | Source: Ambulatory Visit | Attending: Urology | Admitting: Urology

## 2022-11-17 DIAGNOSIS — N200 Calculus of kidney: Secondary | ICD-10-CM | POA: Diagnosis not present

## 2022-11-20 NOTE — Progress Notes (Signed)
History of Present Illness: Natasha Natasha Warren Natasha Warren followed here for urolithiasis.  She has had several stone procedures. Additionally,  she has undergone parathyroid adenectomy for hyperparathyroidism.   She is on potassium citrate 3 times a day.  She says she usually takes it a couple of times a day.  She has had no stone issues over the past year or 2.  She has had no gross hematuria or dysuria.  She thinks her urine looks a little dark.   3.7.2023: Routine check for this lady.  No stone episodes over the past year.  KUB recently performed revealed 10 mm left lower pole stone, 4 mm right upper pole stone.  She still is on potassiums citrate.   Also complaining of urinary frequency, urgency, urgency incontinence, stress incontinence.  6-7 light pads a day used, almost never saturated.  No urinary tract infections over the past year. She was given [facility] behavioral modification sheet.  Past Medical History:  Diagnosis Date   Arthritis    Bulging lumbar disc    Colon polyps    Complication of anesthesia    hard to wake after back surgery, block for rotator cuff 'didn't work'   Diabetes mellitus without complication ([place])    History of gout    History of gout    Hyperlipidemia    Hyperparathyroidism, primary ([place])    Hypertension    Hypothyroidism    Irritable bowel syndrome    Nocturia    Renal disorder    kidney stones   Seasonal allergies    SVT (supraventricular tachycardia)    Thyroid disorder    Urolithiasis     Past Surgical History:  Procedure Laterality Date   [place] SURGERY  09/2016   bladder tack  09/1984   CHOLECYSTECTOMY  01/1999   COLONOSCOPY  06/2006   This was her second one   COLONOSCOPY  07/12/2011   Procedure: COLONOSCOPY;  Surgeon: Rogene Houston, MD;  [facility];  Service: Endoscopy;  Laterality: N/A;  10:30 am   COLONOSCOPY N/A 04/03/2018   Procedure: COLONOSCOPY;  Surgeon: Rogene Houston, MD;  [facility];  Service: Endoscopy;  Laterality: N/A;  [place] Left 10/03/2014   Procedure: CYSTOSCOPY WITH URETERAL STENT PLACEMENT;  Surgeon: Malka So, MD;  [facility];  Service: Urology;  Laterality: Left;   CYSTOSCOPY WITH URETEROSCOPY AND STENT PLACEMENT Left 03/16/2015   Procedure: CYSTOSCOPY WITH LEFT STENT PLACEMENT;  Surgeon: Franchot Gallo, MD;  [facility];  Service: Urology;  Laterality: Left;   gout removal  2017   HOLMIUM LASER APPLICATION Left 0000000   Procedure: HOLMIUM LASER APPLICATION;  Surgeon: Franchot Gallo, MD;  [facility];  Service: Urology;  Laterality: Left;   KNEE ARTHROSCOPY  12/2010   Left knee   LITHOTRIPSY  06/1995   LUMBAR FUSION  06/2016   NEPHROLITHOTOMY Left 01/07/2015   Procedure: NEPHROLITHOTOMY PERCUTANEOUS;  Surgeon: Franchot Gallo, MD;  [facility];  Service: Urology;  Laterality: Left;  With STENT   POLYPECTOMY  04/03/2018   Procedure: POLYPECTOMY;  Surgeon: Rogene Houston, MD;  [facility];  Service: Endoscopy;;  transverse,splenic flexure, sigmoid   REVERSE SHOULDER ARTHROPLASTY Right 05/10/2022   Procedure: REVERSE SHOULDER ARTHROPLASTY;  Surgeon: Hiram Gash, MD;  [facility];  Service: Orthopedics;  Laterality: Right;   Rotor Cuff  2011   Right Shoulder  STENT REMOVAL Left 03/16/2015   Procedure: LEFT URETERAL STENT REMOVAL;  Surgeon: Franchot Gallo, MD;  [facility];  Service: Urology;  Laterality: Left;   THYROID EXPLORATION N/A 07/31/2013   Procedure: neck EXPLORATION;  Surgeon: Odis Hollingshead, MD;  [facility];  Service: General;  Laterality: N/A;   THYROIDECTOMY N/A 07/31/2013   Procedure: PARATHYROIDECTOMY;  Surgeon: Odis Hollingshead, MD;  [facility];  Service: General;  Laterality: N/A;   TUMOR REMOVAL  06/1997   Beign; on back x2 surgeries   TUMOR REMOVAL  2002   "FATTY TUMORS" REMOVED FROM BACK   URETEROSCOPY WITH  HOLMIUM LASER LITHOTRIPSY Left 03/16/2015   Procedure: LEFT URETEROSCOPIC LASER LITHOTRIPSY WITH STONE EXTRACTION;  Surgeon: Franchot Gallo, MD;  [facility];  Service: Urology;  Laterality: Left;   WISDOM TOOTH EXTRACTION      Home Medications:  (Not in a hospital admission)   Allergies:  Allergies  Allergen Reactions   Ace Inhibitors Other (See Comments)    Patient doesn't remember this allergy.  On 12/30/2014 received LOV visit from PCP , Dr Rory Percy in [place], [place].  - No known active drug allergies listed in his office visit note dated 05/21/2014.     Venlafaxine Anxiety    Family History  Problem Relation Age of Onset   Hypertension Mother    Arthritis Mother    Hypertension Father    Healthy Brother    Healthy Daughter    Healthy Daughter    Colon cancer Neg Hx     Social History:  reports that she has never smoked. She has never used smokeless tobacco. She reports that she does not currently use alcohol. She reports that she does not use drugs.  ROS: A complete review of systems was performed.  All systems are negative except for pertinent findings as noted.  Physical Exam:  Vital signs in last 24 hours: '@VSRANGES'$ @ General:  Alert and oriented, No acute distress HEENT: Normocephalic, atraumatic Neck: No JVD or lymphadenopathy Cardiovascular: Regular rate  Lungs: Normal inspiratory/expiratory excursion Abdomen: Soft, nontender, nondistended, no abdominal masses Back: No CVA tenderness Extremities: No edema Neurologic: Grossly intact  I have reviewed prior pt notes  I have reviewed urinalysis results  I have independently reviewed prior imaging--prior KUB's, CT scans  I have reviewed prior urine culture   Impression/Assessment:  ***  Plan:  [name] Nikiah Goin 11/20/2022, 6:06 PM  [name]. Nanette Wirsing MD

## 2022-11-21 ENCOUNTER — Ambulatory Visit (INDEPENDENT_AMBULATORY_CARE_PROVIDER_SITE_OTHER): Payer: Medicare Other | Admitting: Urology

## 2022-11-21 ENCOUNTER — Encounter: Payer: Self-pay | Admitting: Urology

## 2022-11-21 VITALS — BP 142/86 | HR 69

## 2022-11-21 DIAGNOSIS — N200 Calculus of kidney: Secondary | ICD-10-CM | POA: Diagnosis not present

## 2022-11-21 LAB — URINALYSIS, ROUTINE W REFLEX MICROSCOPIC
Bilirubin, UA: NEGATIVE
Glucose, UA: NEGATIVE
Ketones, UA: NEGATIVE
Nitrite, UA: NEGATIVE
Protein,UA: NEGATIVE
RBC, UA: NEGATIVE
Specific Gravity, UA: 1.02 (ref 1.005–1.030)
Urobilinogen, Ur: 0.2 mg/dL (ref 0.2–1.0)
pH, UA: 7 (ref 5.0–7.5)

## 2022-11-21 LAB — MICROSCOPIC EXAMINATION

## 2023-02-20 ENCOUNTER — Encounter (INDEPENDENT_AMBULATORY_CARE_PROVIDER_SITE_OTHER): Payer: Self-pay | Admitting: *Deleted

## 2023-07-18 ENCOUNTER — Ambulatory Visit: Payer: Medicare Other | Attending: Cardiology | Admitting: Cardiology

## 2023-07-18 ENCOUNTER — Encounter: Payer: Self-pay | Admitting: Cardiology

## 2023-07-18 VITALS — BP 130/88 | HR 64 | Ht 62.0 in | Wt 225.6 lb

## 2023-07-18 DIAGNOSIS — I350 Nonrheumatic aortic (valve) stenosis: Secondary | ICD-10-CM

## 2023-07-18 DIAGNOSIS — I10 Essential (primary) hypertension: Secondary | ICD-10-CM | POA: Diagnosis present

## 2023-07-18 DIAGNOSIS — I471 Supraventricular tachycardia, unspecified: Secondary | ICD-10-CM

## 2023-07-18 DIAGNOSIS — E782 Mixed hyperlipidemia: Secondary | ICD-10-CM | POA: Diagnosis present

## 2023-07-18 NOTE — Progress Notes (Signed)
Clinical Summary Natasha Warren is a 77 y.o.female seen today for follow up of the following medical problems.    1. PSVT - no recent palpitaitons, continues to do very well on beta blocker   - no recent palpitations      2. Prior Chest pain/MSK pain - history of inermittent chest dull pain 2-3/10. At rest or with exertion, often with laying down. Can occur after meals. Lasts approx 15-20 minutes. No SOB or palpitations. Ongoing for 1 year, symptoms unchanged since last visit - no recent symptoms.    - seen in ER 03/29/17 with chest pain, primarily left scapular pain radiating into chest. From notes thought to be MSK pain.EKG and cardiac enzymes unremarkable.  Discharged with meloxicam.   - no recent chest pain since last visit.   01/11/2018 ER visit with chest pain. Notes indicate worst with palpation. EKG nonacute, enzymes negative. Symptoms typical of her prior left chest/shoulder blade pain.  - followed up with Dr Channing Mutters, had MRI showed bone spurs in shoulder blade - symptoms have resolved.        3. Aortic stenosis - 04/2021 echo LVEF 60-65%, moderate AS mean grad 24 AVA VTI 1.35 -no SOB/DOE, no chest pains, no dizziness.    4. Chronic back pain - multiple prior surgeries   5. Shoulder surgery 04/2022, replacement -considering having the other shoulder repalced.    6. HTN - she is compliant with meds - home bp's 130s/70s   7. Hyperlipidemia - labs followed by pcp - 03/2023 TC 138 TG 158 HDL 49 LDL 62     8.Preoperative evaluation - considering shoulder surgery - prior shoulder surgery 1 year ago  - sedenary lifestyle, limited because of chronic back and knee pain - tolerates greater than 4 METs.     SH: Her husband TYSHARA CAZAREZ is also a patient of mine, he recently passed away 2017-01-11. Had been married 47 years. Has 2 daughters in the area. 3 grandkids.  Past Medical History:  Diagnosis Date   Arthritis    Bulging lumbar disc    Colon polyps     Complication of anesthesia    hard to wake after back surgery, block for rotator cuff 'didn't work'   Diabetes mellitus without complication (HCC)    History of gout    History of gout    Hyperlipidemia    Hyperparathyroidism, primary (HCC)    Hypertension    Hypothyroidism    Irritable bowel syndrome    Nocturia    Renal disorder    kidney stones   Seasonal allergies    SVT (supraventricular tachycardia)    Thyroid disorder    Urolithiasis      Allergies  Allergen Reactions   Ace Inhibitors Other (See Comments)    Patient doesn't remember this allergy.  On 12/30/2014 received LOV visit from PCP , Dr Selinda Flavin in [place], [place].  - No known active drug allergies listed in his office visit note dated 05/21/2014.     Venlafaxine Anxiety     Current Outpatient Medications  Medication Sig Dispense Refill   allopurinol (ZYLOPRIM) 300 MG tablet Take 300 mg by mouth daily.   5   atorvastatin (LIPITOR) 20 MG tablet Take 20 mg by mouth daily.     Cholecalciferol (VITAMIN D3) 125 MCG (5000 UT) CAPS Take 5,000 Units by mouth daily.     gabapentin (NEURONTIN) 300 MG capsule Take 300 mg by mouth at bedtime.  levothyroxine (SYNTHROID) 50 MCG tablet Take 50 mcg by mouth daily before breakfast.     losartan (COZAAR) 100 MG tablet Take 100 mg by mouth daily.   1   metFORMIN (GLUCOPHAGE) 500 MG tablet Take 500 mg by mouth daily.     methocarbamol (ROBAXIN) 500 MG tablet Take 1 tablet (500 mg total) by mouth every 8 (eight) hours as needed for muscle spasms. 30 tablet 0   metoprolol succinate (TOPROL-XL) 50 MG 24 hr tablet Take 1 & 1/2 tablets daily. 45 tablet 135   polyethylene glycol (MIRALAX / GLYCOLAX) 17 g packet Take 17 g by mouth daily.     Potassium Citrate 15 MEQ (1620 MG) TBCR TAKE 1 TABLET BY MOUTH THREE TIMES DAILY 90 tablet 11   zinc gluconate 50 MG tablet Take 50 mg by mouth daily.     No current facility-administered medications for this visit.   Facility-Administered  Medications Ordered in Other Visits  Medication Dose Route Frequency Provider Last Rate Last Admin   acetaminophen (TYLENOL) tablet 650 mg  650 mg Oral Q4H PRN Bjorn Pippin, MD         Past Surgical History:  Procedure Laterality Date   ABDOMINAL HYSTERECTOMY  1986   APPENDECTOMY  1954   BACK SURGERY  09/2016   bladder tack  09/1984   CHOLECYSTECTOMY  01/1999   COLONOSCOPY  06/2006   This was her second one   COLONOSCOPY  07/12/2011   Procedure: COLONOSCOPY;  Surgeon: Malissa Hippo, MD;  [facility];  Service: Endoscopy;  Laterality: N/A;  10:30 am   COLONOSCOPY N/A 04/03/2018   Procedure: COLONOSCOPY;  Surgeon: Malissa Hippo, MD;  [facility];  Service: Endoscopy;  Laterality: N/A;  830   CYSTOSCOPY W/ URETERAL STENT PLACEMENT Left 10/03/2014   Procedure: CYSTOSCOPY WITH URETERAL STENT PLACEMENT;  Surgeon: Anner Crete, MD;  [facility];  Service: Urology;  Laterality: Left;   CYSTOSCOPY WITH URETEROSCOPY AND STENT PLACEMENT Left 03/16/2015   Procedure: CYSTOSCOPY WITH LEFT STENT PLACEMENT;  Surgeon: Marcine Matar, MD;  Location: [facility];  Service: Urology;  Laterality: Left;   gout removal  2017   HOLMIUM LASER APPLICATION Left 01/07/2015   Procedure: HOLMIUM LASER APPLICATION;  Surgeon: Marcine Matar, MD;  [facility];  Service: Urology;  Laterality: Left;   KNEE ARTHROSCOPY  12/2010   Left knee   LITHOTRIPSY  06/1995   LUMBAR FUSION  06/2016   NEPHROLITHOTOMY Left 01/07/2015   Procedure: NEPHROLITHOTOMY PERCUTANEOUS;  Surgeon: Marcine Matar, MD;  [facility];  Service: Urology;  Laterality: Left;  With STENT   POLYPECTOMY  04/03/2018   Procedure: POLYPECTOMY;  Surgeon: Malissa Hippo, MD;  [facility];  Service: Endoscopy;;  transverse,splenic flexure, sigmoid   REVERSE SHOULDER ARTHROPLASTY Right 05/10/2022   Procedure: REVERSE SHOULDER ARTHROPLASTY;  Surgeon: Bjorn Pippin, MD;  [facility];  Service:  Orthopedics;  Laterality: Right;   Rotor Cuff  2011   Right Shoulder   STENT REMOVAL Left 03/16/2015   Procedure: LEFT URETERAL STENT REMOVAL;  Surgeon: Marcine Matar, MD;  [facility];  Service: Urology;  Laterality: Left;   THYROID EXPLORATION N/A 07/31/2013   Procedure: neck EXPLORATION;  Surgeon: Adolph Pollack, MD;  [facility];  Service: General;  Laterality: N/A;   THYROIDECTOMY N/A 07/31/2013   Procedure: PARATHYROIDECTOMY;  Surgeon: Adolph Pollack, MD;  [facility];  Service: General;  Laterality: N/A;   TUMOR  REMOVAL  06/1997   Beign; on back x2 surgeries   TUMOR REMOVAL  2002   "FATTY TUMORS" REMOVED FROM BACK   URETEROSCOPY WITH HOLMIUM LASER LITHOTRIPSY Left 03/16/2015   Procedure: LEFT URETEROSCOPIC LASER LITHOTRIPSY WITH STONE EXTRACTION;  Surgeon: Marcine Matar, MD;  [facility];  Service: Urology;  Laterality: Left;   WISDOM TOOTH EXTRACTION       Allergies  Allergen Reactions   Ace Inhibitors Other (See Comments)    Patient doesn't remember this allergy.  On 12/30/2014 received LOV visit from PCP , Dr Selinda Flavin in [place], [place].  - No known active drug allergies listed in his office visit note dated 05/21/2014.     Venlafaxine Anxiety      Family History  Problem Relation Age of Onset   Hypertension Mother    Arthritis Mother    Hypertension Father    Healthy Brother    Healthy Daughter    Healthy Daughter    Colon cancer Neg Hx      Social History Ms. Popelka reports that she has never smoked. She has never used smokeless tobacco. Ms. Skehan reports that she does not currently use alcohol.   Review of Systems CONSTITUTIONAL: No weight loss, fever, chills, weakness or fatigue.  HEENT: Eyes: No visual loss, blurred vision, double vision or yellow sclerae.No hearing loss, sneezing, congestion, runny nose or sore throat.  SKIN: No rash or itching.  CARDIOVASCULAR: per hpi RESPIRATORY: No shortness of breath, cough or  sputum.  GASTROINTESTINAL: No anorexia, nausea, vomiting or diarrhea. No abdominal pain or blood.  GENITOURINARY: No burning on urination, no polyuria NEUROLOGICAL: No headache, dizziness, syncope, paralysis, ataxia, numbness or tingling in the extremities. No change in bowel or bladder control.  MUSCULOSKELETAL:chronic back pain LYMPHATICS: No enlarged nodes. No history of splenectomy.  PSYCHIATRIC: No history of depression or anxiety.  ENDOCRINOLOGIC: No reports of sweating, cold or heat intolerance. No polyuria or polydipsia.  Marland Kitchen   Physical Examination Today's Vitals   07/18/23 1012  BP: 130/88  Pulse: 64  SpO2: 95%  Weight: 225 lb 9.6 oz (102.3 kg)  Height: 5\' 2"  (1.575 m)   Body mass index is 41.26 kg/m.  Gen: resting comfortably, no acute distress HEENT: no scleral icterus, pupils equal round and reactive, no palptable cervical adenopathy,  CV: RRR, 3/6 systolic murmur rusb, no jvd Resp: Clear to auscultation bilaterally GI: abdomen is soft, non-tender, non-distended, normal bowel sounds, no hepatosplenomegaly MSK: extremities are warm, no edema.  Skin: warm, no rash Neuro:  no focal deficits Psych: appropriate affect   Diagnostic Studies  Jan 2017 echo Study Conclusions  - Left ventricle: The cavity size was normal. Wall thickness was   increased in a pattern of mild LVH. Systolic function was normal.   The estimated ejection fraction was in the range of 60% to 65%.   Wall motion was normal; there were no regional wall motion   abnormalities. Doppler parameters are consistent with abnormal   left ventricular relaxation (grade 1 diastolic dysfunction). - Aortic valve: Mildly calcified annulus. - Mitral valve: Calcified annulus. There was mild regurgitation. - Tricuspid valve: There was mild regurgitation.   04/2020 ECHO [facility] Summary    1. The left ventricle is normal in size with mildly increased wall   thickness.    2. The left ventricular systolic function is  normal, LVEF is visually   estimated at 65-70%.    3. There is grade I diastolic dysfunction (impaired relaxation).  4. Mitral annular calcification is present.    5. There is moderate aortic valve stenosis.    6. The left atrium is mildly dilated in size.    7. The right ventricle is normal in size, with normal systolic function.    8. There is mild pulmonary hypertension, estimated pulmonary artery systolic   pressure is 41 mmHg.         Left Ventricle    The left ventricle is normal in size with mildly increased wall thickness.    The left ventricular systolic function is normal, LVEF is visually estimated   at 65-70%.    There is grade I diastolic dysfunction (impaired relaxation).      Right Ventricle    The right ventricle is normal in size, with normal systolic function.         Left Atrium    The left atrium is mildly dilated in size.      Right Atrium    The right atrium is normal  in size.         Aortic Valve    The aortic valve is trileaflet with mildly thickened leaflets with mildly   reduced excursion.    There is no significant aortic regurgitation.    There is moderate aortic valve stenosis.    Peak AV transvalvular velocity:  2.8 m/s.    Mean gradient: 18 mmHg.    Doppler velocity index: 0.41.    Estimated aortic valve area (VTI): 1.3 cm2.    Estimated aortic valve area (velocity): 1.3 cm2.    LVOT diameter: 2.0 cm.    LV stroke volume index: 33.4 ml/m2.      Pulmonic Valve    The pulmonic valve is normal.    There is no significant pulmonic regurgitation.    There is no evidence of a significant transvalvular gradient.      Mitral Valve    The mitral valve leaflets are normal with normal leaflet mobility.    Mitral annular calcification is present.    There is trivial mitral valve regurgitation.      Tricuspid Valve    The tricuspid valve leaflets are normal, with normal leaflet mobility.    There is mild tricuspid regurgitation.    There is  mild pulmonary hypertension, estimated pulmonary artery systolic   pressure is 41 mmHg.         Other Findings    Rhythm: Sinus Rhythm.      Pericardium/Pleural    There is no pericardial effusion.      Inferior Vena Cava    IVC size and inspiratory change suggest normal right atrial pressure. (0-5   mmHg).      Aorta    The aorta is normal in size in the visualized segments.      04/2020 echo [facility] Summary    1. The left ventricle is normal in size with mildly increased wall  thickness.    2. The left ventricular systolic function is normal, LVEF is visually  estimated at 65-70%.    3. There is grade I diastolic dysfunction (impaired relaxation).    4. Mitral annular calcification is present.    5. There is moderate aortic valve stenosis.    6. The left atrium is mildly dilated in size.    7. The right ventricle is normal in size, with normal systolic function.    8. There is mild pulmonary hypertension, estimated pulmonary artery systolic  pressure is 41 mmHg.  04/2021 echo IMPRESSIONS     1. Left ventricular ejection fraction, by estimation, is 60 to 65%. The  left ventricle has normal function. The left ventricle has no regional  wall motion abnormalities. There is mild left ventricular hypertrophy.  Left ventricular diastolic parameters  are indeterminate.   2. Right ventricular systolic function is normal. The right ventricular  size is normal. There is mildly elevated pulmonary artery systolic  pressure. The estimated right ventricular systolic pressure is 40.7 mmHg.   3. The mitral valve is grossly normal. Trivial mitral valve  regurgitation.   4. The aortic valve is tricuspid. There is moderate calcification of the  aortic valve. Aortic valve regurgitation is not visualized. Moderate  aortic valve stenosis. Aortic valve mean gradient measures 24.0 mmHg.  Aortic valve Vmax measures 3.17 m/s.  Dimentionless index 0.47.   5. The inferior vena cava is normal  in size with greater than 50%  respiratory variability, suggesting right atrial pressure of 3 mmHg.          Assessment and Plan   1. PSVT - no symptoms, EKG today shows NSR - continue metoprolol   2. Aortic stenosis - moderate by recent echo, has not had symptoms - due for repeat study, order echo   3,. HTN - at goal, continue current meds   4. Hyperlipidemia - at goal, continue current meds  5. Preoperative evaluation - considering shoulder replacement - had other shoulder done last year and did well - tolerates greater than 4 METs - f/u echo for reasssessment of her aortic valve, if stable would be ok for surgery   F/u 1 year    Antoine Poche, M.D.

## 2023-07-18 NOTE — Patient Instructions (Signed)
Medication Instructions:  Your physician recommends that you continue on your current medications as directed. Please refer to the Current Medication list given to you today.   Labwork: None  Testing/Procedures: Your physician has requested that you have an echocardiogram. Echocardiography is a painless test that uses sound waves to create images of your heart. It provides your doctor with information about the size and shape of your heart and how well your heart's chambers and valves are working. This procedure takes approximately one hour. There are no restrictions for this procedure. Please do NOT wear cologne, perfume, aftershave, or lotions (deodorant is allowed). Please arrive 15 minutes prior to your appointment time.   Follow-Up: Your physician recommends that you schedule a follow-up appointment in: 1 year. You will receive a reminder call in about 8 months reminding you to schedule your appointment. If you don't receive this call, please contact our office.   Any Other Special Instructions Will Be Listed Below (If Applicable).  Thank you for choosing [facility]!      If you need a refill on your cardiac medications before your next appointment, please call your pharmacy.

## 2023-07-31 ENCOUNTER — Other Ambulatory Visit (HOSPITAL_COMMUNITY): Payer: Self-pay | Admitting: Neurological Surgery

## 2023-07-31 DIAGNOSIS — M5416 Radiculopathy, lumbar region: Secondary | ICD-10-CM

## 2023-08-02 ENCOUNTER — Ambulatory Visit: Payer: Medicare Other | Attending: Cardiology

## 2023-08-02 DIAGNOSIS — I350 Nonrheumatic aortic (valve) stenosis: Secondary | ICD-10-CM | POA: Diagnosis present

## 2023-08-02 LAB — ECHOCARDIOGRAM COMPLETE
AR max vel: 1.37 cm2
AV Area VTI: 1.32 cm2
AV Area mean vel: 1.51 cm2
AV Mean grad: 28 mm[Hg]
AV Peak grad: 47.2 mm[Hg]
Ao pk vel: 3.44 m/s
Area-P 1/2: 2.44 cm2
Calc EF: 64.8 %
MV VTI: 2.94 cm2
P 1/2 time: 755 ms
S' Lateral: 2.2 cm
Single Plane A2C EF: 71.7 %
Single Plane A4C EF: 59.9 %

## 2023-08-06 ENCOUNTER — Ambulatory Visit (HOSPITAL_COMMUNITY)
Admission: RE | Admit: 2023-08-06 | Discharge: 2023-08-06 | Disposition: A | Discharge status: Home / Self Care | Payer: Medicare Other | Source: Ambulatory Visit | Attending: Neurological Surgery | Admitting: Neurological Surgery

## 2023-08-06 DIAGNOSIS — M5416 Radiculopathy, lumbar region: Secondary | ICD-10-CM | POA: Insufficient documentation

## 2023-08-22 ENCOUNTER — Other Ambulatory Visit: Payer: Self-pay | Admitting: Neurological Surgery

## 2023-08-23 ENCOUNTER — Telehealth: Payer: Self-pay | Admitting: Cardiology

## 2023-08-23 NOTE — Telephone Encounter (Signed)
Darnelle Spangle 77 year old female was recently seen by you in clinic on 07/18/2023.  She is requesting preoperative cardiac evaluation for L2-5 lumbar reexploration/removal of hardware/L4-5 fusion.  From a cardiac standpoint would she be acceptable risk for upcoming surgery?  Thank you for your help.  Please direct your response to [facility] preop pool.  Thomasene Ripple. Marshell Dilauro NP-C     08/23/2023, 12:45 PM [facility] [address] Office [phone] Fax [phone]

## 2023-08-23 NOTE — Telephone Encounter (Signed)
   Pre-operative Risk Assessment  Last visit: 07-08-2023 Next visit: none Patient Name: Natasha Warren  DOB: October 06, 1945 MRN: [id number]      Request for Surgical Clearance    Procedure:   L2-5 Lumbar Re-exploration/removal of hardware/L4-5 Fusion  Date of Surgery:  Clearance 10/03/23                                 Surgeon:  Dr. Marikay Alar  Surgeon's Group or Practice Name:  [facility] Phone number:  [phone] Fax number:  [phone]   Type of Clearance Requested:   - Medical    Type of Anesthesia:  General    Additional requests/questions:    SignedRoyann Shivers   08/23/2023, 12:15 PM

## 2023-08-28 NOTE — Telephone Encounter (Signed)
Tolerates greater than 4 METs based on last visit. Aortic stenosis is moderate and stable by recent US. Ok to proceed with surgery from cardiac standpoint  Dominga Ferry MD

## 2023-08-29 NOTE — Telephone Encounter (Signed)
   Patient Name: Natasha Warren  DOB: [birth year] MRN: [id number]  Primary Cardiologist: Dina Rich, MD  Chart reviewed as part of pre-operative protocol coverage. Given past medical history and time since last visit, based on ACC/AHA guidelines, MAKINZE TRUMBLY is at acceptable risk for the planned procedure without further cardiovascular testing. Reviewed by Dr. Dina Rich and is okay from a cardiac standpoint to move forward.   The patient was advised that if she develops new symptoms prior to surgery to contact our office to arrange for a follow-up visit, and she verbalized understanding.  I will route this recommendation to the requesting party via Epic fax function and remove from pre-op pool.  Please call with questions.  Joni Reining, NP 08/29/2023, 7:38 AM

## 2023-09-11 ENCOUNTER — Encounter: Payer: Self-pay | Admitting: *Deleted

## 2023-09-28 ENCOUNTER — Inpatient Hospital Stay (HOSPITAL_COMMUNITY): Admission: RE | Admit: 2023-09-28 | Payer: Medicare Other | Source: Ambulatory Visit

## 2023-10-03 ENCOUNTER — Inpatient Hospital Stay (HOSPITAL_COMMUNITY): Admit: 2023-10-03 | Payer: Medicare Other | Admitting: Neurological Surgery

## 2023-10-03 SURGERY — POSTERIOR LUMBAR FUSION 2 WITH HARDWARE REMOVAL
Anesthesia: General | Site: Back

## 2023-11-13 ENCOUNTER — Ambulatory Visit (HOSPITAL_COMMUNITY)
Admission: RE | Admit: 2023-11-13 | Discharge: 2023-11-13 | Disposition: A | Discharge status: Home / Self Care | Payer: Medicare Other | Source: Ambulatory Visit | Attending: Urology | Admitting: Urology

## 2023-11-13 DIAGNOSIS — N200 Calculus of kidney: Secondary | ICD-10-CM | POA: Diagnosis present

## 2023-11-19 NOTE — Progress Notes (Signed)
 History of Present Illness: Natasha Warren followed here for urolithiasis.  She has had several stone procedures. Additionally, she has undergone parathyroid adenectomy for hyperparathyroidism.   She is on potassium citrate 3 times a day.  She says she usually takes it a couple of times a day.  She has had no stone issues over the past year or 2.    3.4.2025:  Past Medical History:  Diagnosis Date   Arthritis    Bulging lumbar disc    Colon polyps    Complication of anesthesia    hard to wake after back surgery, block for rotator cuff 'didn't work'   Diabetes mellitus without complication (HCC)    History of gout    History of gout    Hyperlipidemia    Hyperparathyroidism, primary (HCC)    Hypertension    Hypothyroidism    Irritable bowel syndrome    Nocturia    Renal disorder    kidney stones   Seasonal allergies    SVT (supraventricular tachycardia) (HCC)    Thyroid disorder    Urolithiasis     Past Surgical History:  Procedure Laterality Date   ABDOMINAL HYSTERECTOMY  1986   APPENDECTOMY  1954   BACK SURGERY  09/2016   bladder tack  09/1984   CHOLECYSTECTOMY  01/1999   COLONOSCOPY  06/2006   This was her second one   COLONOSCOPY  07/12/2011   Procedure: COLONOSCOPY;  Surgeon: Malissa Hippo, MD;  [facility];  Service: Endoscopy;  Laterality: N/A;  10:30 am   COLONOSCOPY N/A 04/03/2018   Procedure: COLONOSCOPY;  Surgeon: Malissa Hippo, MD;  [facility];  Service: Endoscopy;  Laterality: N/A;  830   CYSTOSCOPY W/ URETERAL STENT PLACEMENT Left 10/03/2014   Procedure: CYSTOSCOPY WITH URETERAL STENT PLACEMENT;  Surgeon: Anner Crete, MD;  [facility];  Service: Urology;  Laterality: Left;   CYSTOSCOPY WITH URETEROSCOPY AND STENT PLACEMENT Left 03/16/2015   Procedure: CYSTOSCOPY WITH LEFT STENT PLACEMENT;  Surgeon: Marcine Matar, MD;  [facility];  Service: Urology;  Laterality: Left;   gout removal  2017   HOLMIUM LASER APPLICATION  Left 01/07/2015   Procedure: HOLMIUM LASER APPLICATION;  Surgeon: Marcine Matar, MD;  [facility];  Service: Urology;  Laterality: Left;   KNEE ARTHROSCOPY  12/2010   Left knee   LITHOTRIPSY  06/1995   LUMBAR FUSION  06/2016   NEPHROLITHOTOMY Left 01/07/2015   Procedure: NEPHROLITHOTOMY PERCUTANEOUS;  Surgeon: Marcine Matar, MD;  [facility];  Service: Urology;  Laterality: Left;  With STENT   POLYPECTOMY  04/03/2018   Procedure: POLYPECTOMY;  Surgeon: Malissa Hippo, MD;  [facility];  Service: Endoscopy;;  transverse,splenic flexure, sigmoid   REVERSE SHOULDER ARTHROPLASTY Right 05/10/2022   Procedure: REVERSE SHOULDER ARTHROPLASTY;  Surgeon: Bjorn Pippin, MD;  [facility];  Service: Orthopedics;  Laterality: Right;   Rotor Cuff  2011   Right Shoulder   STENT REMOVAL Left 03/16/2015   Procedure: LEFT URETERAL STENT REMOVAL;  Surgeon: Marcine Matar, MD;  [facility];  Service: Urology;  Laterality: Left;   THYROID EXPLORATION N/A 07/31/2013   Procedure: neck EXPLORATION;  Surgeon: Adolph Pollack, MD;  [facility];  Service: General;  Laterality: N/A;   THYROIDECTOMY N/A 07/31/2013   Procedure: PARATHYROIDECTOMY;  Surgeon: Adolph Pollack, MD;  [facility];  Service: General;  Laterality: N/A;   TUMOR REMOVAL  06/1997   Beign; on back x2  surgeries   TUMOR REMOVAL  2002   "FATTY TUMORS" REMOVED FROM BACK   URETEROSCOPY WITH HOLMIUM LASER LITHOTRIPSY Left 03/16/2015   Procedure: LEFT URETEROSCOPIC LASER LITHOTRIPSY WITH STONE EXTRACTION;  Surgeon: Marcine Matar, MD;  [facility];  Service: Urology;  Laterality: Left;   WISDOM TOOTH EXTRACTION      Home Medications:  (Not in a hospital admission)   Allergies:  Allergies  Allergen Reactions   Ace Inhibitors Other (See Comments)    Patient doesn't remember this allergy.  On 12/30/2014 received LOV visit from PCP , Dr Selinda Flavin in [place], [place].  - No known active drug  allergies listed in his office visit note dated 05/21/2014.     Venlafaxine Anxiety    Family History  Problem Relation Age of Onset   Hypertension Mother    Arthritis Mother    Hypertension Father    Healthy Brother    Healthy Daughter    Healthy Daughter    Colon cancer Neg Hx     Social History:  reports that she has never smoked. She has never used smokeless tobacco. She reports that she does not currently use alcohol. She reports that she does not use drugs.  ROS: A complete review of systems was performed.  All systems are negative except for pertinent findings as noted.  Physical Exam:  Vital signs in last 24 hours: @VSRANGES @ General:  Alert and oriented, No acute distress HEENT: Normocephalic, atraumatic Neck: No JVD or lymphadenopathy Cardiovascular: Regular rate  Lungs: Normal inspiratory/expiratory excursion Neurologic: Grossly intact  I have reviewed prior pt notes  I have reviewed urinalysis results  I have independently reviewed prior imaging--prior KUB's, CT scans  I have reviewed prior urine culture   Impression/Assessment:  Bilateral nephrolithiasis, stable in volume and location.  On potassium citrate which she tolerates well  Plan:  I will have her come back in 1 year with repeat KUB  Bertram Millard Natasha Warren 11/19/2023, 6:54 AM  Bertram Millard. Natasha Mochizuki MD

## 2023-11-20 ENCOUNTER — Encounter: Payer: Self-pay | Admitting: Urology

## 2023-11-20 ENCOUNTER — Ambulatory Visit (INDEPENDENT_AMBULATORY_CARE_PROVIDER_SITE_OTHER): Payer: Medicare Other | Admitting: Urology

## 2023-11-20 VITALS — BP 164/81 | HR 72

## 2023-11-20 DIAGNOSIS — N200 Calculus of kidney: Secondary | ICD-10-CM

## 2023-11-20 DIAGNOSIS — N3281 Overactive bladder: Secondary | ICD-10-CM

## 2023-11-21 LAB — MICROSCOPIC EXAMINATION
Bacteria, UA: NONE SEEN
RBC, Urine: NONE SEEN /HPF (ref 0–2)

## 2023-11-21 LAB — URINALYSIS, ROUTINE W REFLEX MICROSCOPIC
Bilirubin, UA: NEGATIVE
Glucose, UA: NEGATIVE
Ketones, UA: NEGATIVE
Nitrite, UA: NEGATIVE
Protein,UA: NEGATIVE
RBC, UA: NEGATIVE
Specific Gravity, UA: 1.01 (ref 1.005–1.030)
Urobilinogen, Ur: 0.2 mg/dL (ref 0.2–1.0)
pH, UA: 6.5 (ref 5.0–7.5)

## 2023-12-06 NOTE — Progress Notes (Signed)
 Surgical Instructions   Your procedure is scheduled on Monday March 31. Report to [facility] 2 Main Entrance "A" at 8:35 A.M., then check in with the Admitting office. Any questions or running late day of surgery: call [phone]  Questions prior to your surgery date: call [phone], Monday-Friday, 8am-4pm. If you experience any cold or flu symptoms such as cough, fever, chills, shortness of breath, etc. between now and your scheduled surgery, please notify us at the above number.     Remember:  Do not eat or drink anything after midnight the night before your surgery    Take these medicines the morning of surgery with A SIP OF WATER  allopurinol (ZYLOPRIM)  atorvastatin (LIPITOR)  levothyroxine (SYNTHROID)  metoprolol succinate (TOPROL-XL)   May take these medicines IF NEEDED: oxyCODONE-acetaminophen (PERCOCET/ROXICET)  tiZANidine (ZANAFLEX)   One week prior to surgery, STOP taking any Aleve, Naproxen, Ibuprofen, Motrin, Advil, Goody's, BC's, all herbal medications, fish oil, and non-prescription vitamins.  FOLLOW YOUR SURGEON'S INSTRUCTIONS FOR WHEN TO STOP ASPIRIN. IF NO INSTRUCTIONS WERE GIVEN, YOU MUST CALL THE OFFICE.     WHAT DO I DO ABOUT MY DIABETES MEDICATION?  Do not take oral diabetes medicines (pills) the morning of surgery. DO NOT TAKE metFORMIN (GLUCOPHAGE) THE MORNING OF SURGERY.  HOW TO MANAGE YOUR DIABETES BEFORE AND AFTER SURGERY  Why is it important to control my blood sugar before and after surgery? Improving blood sugar levels before and after surgery helps healing and can limit problems. A way of improving blood sugar control is eating a healthy diet by:  Eating less sugar and carbohydrates  Increasing activity/exercise  Talking with your doctor about reaching your blood sugar goals High blood sugars (greater than 180 mg/dL) can raise your risk of infections and slow your recovery, so you will need to focus on controlling your diabetes during the  weeks before surgery. Make sure that the doctor who takes care of your diabetes knows about your planned surgery including the date and location.  How do I manage my blood sugar before surgery? Check your blood sugar at least 4 times a day, starting 2 days before surgery, to make sure that the level is not too high or low.  Check your blood sugar the morning of your surgery when you wake up and every 2 hours until you get to the Short Stay unit.  If your blood sugar is less than 70 mg/dL, you will need to treat for low blood sugar: Do not take insulin. Treat a low blood sugar (less than 70 mg/dL) with  cup of clear juice (cranberry or apple), 4 glucose tablets, OR glucose gel. Recheck blood sugar in 15 minutes after treatment (to make sure it is greater than 70 mg/dL). If your blood sugar is not greater than 70 mg/dL on recheck, call [phone] for further instructions. Report your blood sugar to the short stay nurse when you get to Short Stay.  If you are admitted to the hospital after surgery: Your blood sugar will be checked by the staff and you will probably be given insulin after surgery (instead of oral diabetes medicines) to make sure you have good blood sugar levels. The goal for blood sugar control after surgery is 80-180 mg/dL.                 Do NOT Smoke (Tobacco/Vaping) for 24 hours prior to your procedure.  If you use a CPAP at night, you may bring your mask/headgear for your overnight stay.  You will be asked to remove any contacts, glasses, piercing's, hearing aid's, dentures/partials prior to surgery. Please bring cases for these items if needed.    Patients discharged the day of surgery will not be allowed to drive home, and someone needs to stay with them for 24 hours.  SURGICAL WAITING ROOM VISITATION Patients may have no more than 2 support people in the waiting area - these visitors may rotate.   Pre-op nurse will coordinate an appropriate time for 1 ADULT support  person, who may not rotate, to accompany patient in pre-op.  Children under the age of 69 must have an adult with them who is not the patient and must remain in the main waiting area with an adult.  If the patient needs to stay at the hospital during part of their recovery, the visitor guidelines for inpatient rooms apply.  Please refer to the [facility] website for the visitor guidelines for any additional information.   If you received a COVID test during your pre-op visit  it is requested that you wear a mask when out in public, stay away from anyone that may not be feeling well and notify your surgeon if you develop symptoms. If you have been in contact with anyone that has tested positive in the last 10 days please notify you surgeon.      Pre-operative 5 CHG Bathing Instructions   You can play a key role in reducing the risk of infection after surgery. Your skin needs to be as free of germs as possible. You can reduce the number of germs on your skin by washing with CHG (chlorhexidine gluconate) soap before surgery. CHG is an antiseptic soap that kills germs and continues to kill germs even after washing.   DO NOT use if you have an allergy to chlorhexidine/CHG or antibacterial soaps. If your skin becomes reddened or irritated, stop using the CHG and notify one of our RNs at [phone].   Please shower with the CHG soap starting 4 days before surgery using the following schedule:     Please keep in mind the following:  DO NOT shave, including legs and underarms, starting the day of your first shower.   You may shave your face at any point before/day of surgery.  Place clean sheets on your bed the day you start using CHG soap. Use a clean washcloth (not used since being washed) for each shower. DO NOT sleep with pets once you start using the CHG.   CHG Shower Instructions:  Wash your face and private area with normal soap. If you choose to wash your hair, wash first with your  normal shampoo.  After you use shampoo/soap, rinse your hair and body thoroughly to remove shampoo/soap residue.  Turn the water OFF and apply about 3 tablespoons (45 ml) of CHG soap to a CLEAN washcloth.  Apply CHG soap ONLY FROM YOUR NECK DOWN TO YOUR TOES (washing for 3-5 minutes)  DO NOT use CHG soap on face, private areas, open wounds, or sores.  Pay special attention to the area where your surgery is being performed.  If you are having back surgery, having someone wash your back for you may be helpful. Wait 2 minutes after CHG soap is applied, then you may rinse off the CHG soap.  Pat dry with a clean towel  Put on clean clothes/pajamas   If you choose to wear lotion, please use ONLY the CHG-compatible lotions that are listed below.  Additional instructions for the  day of surgery: DO NOT APPLY any lotions, deodorants, cologne, or perfumes.   Do not bring valuables to the hospital. [facility] is not responsible for any belongings/valuables. Do not wear nail polish, gel polish, artificial nails, or any other type of covering on natural nails (fingers and toes) Do not wear jewelry or makeup Put on clean/comfortable clothes.  Please brush your teeth.  Ask your nurse before applying any prescription medications to the skin.     CHG Compatible Lotions   Aveeno Moisturizing lotion  Cetaphil Moisturizing Cream  Cetaphil Moisturizing Lotion  Clairol Herbal Essence Moisturizing Lotion, Dry Skin  Clairol Herbal Essence Moisturizing Lotion, Extra Dry Skin  Clairol Herbal Essence Moisturizing Lotion, Normal Skin  Curel Age Defying Therapeutic Moisturizing Lotion with Alpha Hydroxy  Curel Extreme Care Body Lotion  Curel Soothing Hands Moisturizing Hand Lotion  Curel Therapeutic Moisturizing Cream, Fragrance-Free  Curel Therapeutic Moisturizing Lotion, Fragrance-Free  Curel Therapeutic Moisturizing Lotion, Original Formula  Eucerin Daily Replenishing Lotion  Eucerin Dry Skin Therapy  Plus Alpha Hydroxy Crme  Eucerin Dry Skin Therapy Plus Alpha Hydroxy Lotion  Eucerin Original Crme  Eucerin Original Lotion  Eucerin Plus Crme Eucerin Plus Lotion  Eucerin TriLipid Replenishing Lotion  Keri Anti-Bacterial Hand Lotion  Keri Deep Conditioning Original Lotion Dry Skin Formula Softly Scented  Keri Deep Conditioning Original Lotion, Fragrance Free Sensitive Skin Formula  Keri Lotion Fast Absorbing Fragrance Free Sensitive Skin Formula  Keri Lotion Fast Absorbing Softly Scented Dry Skin Formula  Keri Original Lotion  Keri Skin Renewal Lotion Keri Silky Smooth Lotion  Keri Silky Smooth Sensitive Skin Lotion  Nivea Body Creamy Conditioning Oil  Nivea Body Extra Enriched Lotion  Nivea Body Original Lotion  Nivea Body Sheer Moisturizing Lotion Nivea Crme  Nivea Skin Firming Lotion  NutraDerm 30 Skin Lotion  NutraDerm Skin Lotion  NutraDerm Therapeutic Skin Cream  NutraDerm Therapeutic Skin Lotion  ProShield Protective Hand Cream  Provon moisturizing lotion  Please read over the following fact sheets that you were given.

## 2023-12-07 ENCOUNTER — Encounter (HOSPITAL_COMMUNITY): Payer: Self-pay

## 2023-12-07 ENCOUNTER — Encounter (HOSPITAL_COMMUNITY)
Admission: RE | Admit: 2023-12-07 | Discharge: 2023-12-07 | Disposition: A | Discharge status: Home / Self Care | Source: Ambulatory Visit | Attending: Neurological Surgery | Admitting: Neurological Surgery

## 2023-12-07 ENCOUNTER — Other Ambulatory Visit: Payer: Self-pay

## 2023-12-07 VITALS — BP 114/85 | HR 57 | Temp 98.0°F | Resp 20 | Ht 62.0 in | Wt 223.3 lb

## 2023-12-07 DIAGNOSIS — E669 Obesity, unspecified: Secondary | ICD-10-CM | POA: Insufficient documentation

## 2023-12-07 DIAGNOSIS — E890 Postprocedural hypothyroidism: Secondary | ICD-10-CM | POA: Insufficient documentation

## 2023-12-07 DIAGNOSIS — I129 Hypertensive chronic kidney disease with stage 1 through stage 4 chronic kidney disease, or unspecified chronic kidney disease: Secondary | ICD-10-CM | POA: Insufficient documentation

## 2023-12-07 DIAGNOSIS — M960 Pseudarthrosis after fusion or arthrodesis: Secondary | ICD-10-CM | POA: Insufficient documentation

## 2023-12-07 DIAGNOSIS — Z01818 Encounter for other preprocedural examination: Secondary | ICD-10-CM | POA: Diagnosis present

## 2023-12-07 DIAGNOSIS — Z7984 Long term (current) use of oral hypoglycemic drugs: Secondary | ICD-10-CM | POA: Diagnosis not present

## 2023-12-07 DIAGNOSIS — E1122 Type 2 diabetes mellitus with diabetic chronic kidney disease: Secondary | ICD-10-CM | POA: Insufficient documentation

## 2023-12-07 DIAGNOSIS — I350 Nonrheumatic aortic (valve) stenosis: Secondary | ICD-10-CM | POA: Insufficient documentation

## 2023-12-07 DIAGNOSIS — I4729 Other ventricular tachycardia: Secondary | ICD-10-CM | POA: Insufficient documentation

## 2023-12-07 DIAGNOSIS — M1990 Unspecified osteoarthritis, unspecified site: Secondary | ICD-10-CM | POA: Insufficient documentation

## 2023-12-07 DIAGNOSIS — Z6841 Body Mass Index (BMI) 40.0 and over, adult: Secondary | ICD-10-CM | POA: Diagnosis not present

## 2023-12-07 DIAGNOSIS — Z01812 Encounter for preprocedural laboratory examination: Secondary | ICD-10-CM | POA: Diagnosis not present

## 2023-12-07 DIAGNOSIS — N189 Chronic kidney disease, unspecified: Secondary | ICD-10-CM | POA: Diagnosis not present

## 2023-12-07 DIAGNOSIS — E119 Type 2 diabetes mellitus without complications: Secondary | ICD-10-CM

## 2023-12-07 DIAGNOSIS — E1140 Type 2 diabetes mellitus with diabetic neuropathy, unspecified: Secondary | ICD-10-CM | POA: Diagnosis not present

## 2023-12-07 HISTORY — DX: Cardiac murmur, unspecified: R01.1

## 2023-12-07 LAB — BASIC METABOLIC PANEL
Anion gap: 10 (ref 5–15)
BUN: 26 mg/dL — ABNORMAL HIGH (ref 8–23)
CO2: 27 mmol/L (ref 22–32)
Calcium: 9.3 mg/dL (ref 8.9–10.3)
Chloride: 104 mmol/L (ref 98–111)
Creatinine, Ser: 1.13 mg/dL — ABNORMAL HIGH (ref 0.44–1.00)
GFR, Estimated: 50 mL/min — ABNORMAL LOW (ref >60)
Glucose, Bld: 87 mg/dL (ref 70–99)
Potassium: 4.7 mmol/L (ref 3.5–5.1)
Sodium: 141 mmol/L (ref 135–145)

## 2023-12-07 LAB — SURGICAL PCR SCREEN
MRSA, PCR: NEGATIVE
Staphylococcus aureus: NEGATIVE

## 2023-12-07 LAB — CBC
HCT: 46.4 % — ABNORMAL HIGH (ref 36.0–46.0)
Hemoglobin: 14.4 g/dL (ref 12.0–15.0)
MCH: 29.6 pg (ref 26.0–34.0)
MCHC: 31 g/dL (ref 30.0–36.0)
MCV: 95.5 fL (ref 80.0–100.0)
Platelets: 258 10*3/uL (ref 150–400)
RBC: 4.86 MIL/uL (ref 3.87–5.11)
RDW: 14.9 % (ref 11.5–15.5)
WBC: 8.1 10*3/uL (ref 4.0–10.5)
nRBC: 0 % (ref 0.0–0.2)

## 2023-12-07 LAB — HEMOGLOBIN A1C
Hgb A1c MFr Bld: 5.4 % (ref 4.8–5.6)
Mean Plasma Glucose: 108.28 mg/dL

## 2023-12-07 NOTE — Progress Notes (Signed)
 PCP - Cecil Cranker, MD  Cardiologist - Branch, Dorothe Pea, MD (LOV 07-07-23) Urology Dahlstedt, Jeannett Senior, MD  ( hx of kidney stones)   PPM/ICD - denies Device Orders - n.a Rep Notified - n.a  Chest x-ray - denies EKG - 07-18-23 Stress Test - denies ECHO - 08-02-23 Cardiac Cath - denies  Sleep Study - denies CPAP - n/a  Fasting Blood Sugar - Per patient blood sugar ranges between 94-109 Checks Blood Sugar per patient she checks blood sugar once a week and MD monitors A1C times a day  Last dose of GLP1 agonist- denies  GLP1 instructions: n/a  Blood Thinner Instructions:denies Aspirin Instructions: Instructed patient to call surgeon's office for further instructions  ERAS Protcol -NPO   COVID TEST- n/a   Anesthesia review: Yes per patient reason for canceling last scheduled surgery was vertigo, she denies any further episodes of vertigo  Patient denies shortness of breath, fever, cough and chest pain at PAT appointment   All instructions explained to the patient, with a verbal understanding of the material. Patient agrees to go over the instructions while at home for a better understanding. Patient also instructed to self quarantine after being tested for COVID-19. The opportunity to ask questions was provided.

## 2023-12-10 ENCOUNTER — Other Ambulatory Visit: Payer: Self-pay | Admitting: Urology

## 2023-12-10 ENCOUNTER — Encounter (HOSPITAL_COMMUNITY): Payer: Self-pay

## 2023-12-10 DIAGNOSIS — N200 Calculus of kidney: Secondary | ICD-10-CM

## 2023-12-10 NOTE — Anesthesia Preprocedure Evaluation (Addendum)
 Anesthesia Evaluation  Patient identified by MRN, date of birth, ID band Patient awake    Reviewed: Allergy & Precautions, NPO status , Patient's Chart, lab work & pertinent test results  Airway Mallampati: III  TM Distance: >3 FB Neck ROM: Full    Dental no notable dental hx.    Pulmonary neg pulmonary ROS   Pulmonary exam normal        Cardiovascular hypertension, Pt. on medications and Pt. on home beta blockers Normal cardiovascular exam  ECHO:  1. Left ventricular ejection fraction, by estimation, is 60 to 65%. The  left ventricle has normal function. The left ventricle has no regional  wall motion abnormalities. There is mild left ventricular hypertrophy.  Left ventricular diastolic parameters  are indeterminate. Elevated left ventricular end-diastolic pressure.   2. Right ventricular systolic function is normal. The right ventricular  size is normal. There is mildly elevated pulmonary artery systolic  pressure. The estimated right ventricular systolic pressure is 39.5 mmHg.   3. The mitral valve is abnormal. Trivial mitral valve regurgitation. No  evidence of mitral stenosis. Moderate to severe mitral annular  calcification.   4. The aortic valve is tricuspid. There is mild calcification of the  aortic valve. Moderate restriction in the aortic leaflet mobility. Aortic  valve regurgitation is mild. Moderate aortic valve stenosis. Aortic valve  area, by VTI measures 1.32 cm.  Aortic valve mean gradient measures 28.0 mmHg. Aortic valve Vmax measures  3.44 m/s. DVI is 0.33.   5. The inferior vena cava is normal in size with greater than 50%  respiratory variability, suggesting right atrial pressure of 3 mmHg.     Neuro/Psych negative neurological ROS  negative psych ROS   GI/Hepatic negative GI ROS,,,(+)     substance abuse    Endo/Other  diabetes, Oral Hypoglycemic AgentsHypothyroidism    Renal/GU Renal  InsufficiencyRenal disease     Musculoskeletal  (+) Arthritis ,  narcotic dependentGout   Abdominal  (+) + obese  Peds  Hematology negative hematology ROS (+)   Anesthesia Other Findings Lumbar pseudoarthrosis  Reproductive/Obstetrics                              Anesthesia Physical Anesthesia Plan  ASA: 3  Anesthesia Plan: General   Post-op Pain Management:    Induction: Intravenous  PONV Risk Score and Plan: 3 and Ondansetron, Dexamethasone, Midazolam and Treatment may vary due to age or medical condition  Airway Management Planned: Oral ETT  Additional Equipment:   Intra-op Plan:   Post-operative Plan: Extubation in OR  Informed Consent: I have reviewed the patients History and Physical, chart, labs and discussed the procedure including the risks, benefits and alternatives for the proposed anesthesia with the patient or authorized representative who has indicated his/her understanding and acceptance.     Dental advisory given  Plan Discussed with: CRNA  Anesthesia Plan Comments: (PAT note from 3/21)         Anesthesia Quick Evaluation

## 2023-12-10 NOTE — Progress Notes (Signed)
 Case: [id number] Date/Time: 12/17/23 1057   Procedure: LAMINECTOMY WITH POSTERIOR LATERAL ARTHRODESIS LEVEL 1 (Back) - Lumbar re-exploration L2-5 with extraction of loosened hardware and redo L4-71fusion   Anesthesia type: General   Diagnosis: Lumbar pseudoarthrosis [S32.009K]   Pre-op diagnosis: Lumbar pseudoarthrosis   [facility] ROOM [facility] / [facility] OR   Surgeons: Arman Bogus, MD       DISCUSSION: Natasha Warren is a 78 yo female who presents to PAT prior to surgery above. PMH of HTN, NSVT, moderate AS, post procedure hypothyroidism, DM (well controlled, A1c 5.4), neuropathy, CKD, arthritis, obesity (BMI 40), hx of lumbar fusion (2017). Surgery was originally scheduled for 10/03/23 but canceled due to patient having vertigo  Prior complication from anesthesia includes prolonged emergence  Patient follows with Cardiology for hx of SVT, moderate AS, and hx of chest pain. Last seen by Dr. Wyline Mood on 07/18/23. SVT controlled with metoprolol. Echo updated in 07/2023 and shows normal LVEF with stable moderate AS. BP controlled. Cleared for surgery in 08/28/23 phone note:  "Tolerates greater than 4 METs based on last visit. Aortic stenosis is moderate and stable by recent US. Ok to proceed with surgery from cardiac standpoint"  VS: BP 114/85   Pulse (!) 57   Temp 36.7 C (Oral)   Resp 20   Ht 5\' 2"  (1.575 m)   Wt 101.3 kg   SpO2 99%   BMI 40.84 kg/m   PROVIDERS: Donetta Potts, MD Cardiologist - Antoine Poche, MD (LOV 07-07-23)   LABS: Labs reviewed: Acceptable for surgery. (all labs ordered are listed, but only abnormal results are displayed)  Labs Reviewed  BASIC METABOLIC PANEL - Abnormal; Notable for the following components:      Result Value   BUN 26 (*)    Creatinine, Ser 1.13 (*)    GFR, Estimated 50 (*)    All other components within normal limits  CBC - Abnormal; Notable for the following components:   HCT 46.4 (*)    All other components within normal  limits  SURGICAL PCR SCREEN  HEMOGLOBIN A1C     IMAGES: CT lumbar spine 08/06/23:   IMPRESSION: 1. Widespread spinal fusion sequelae. Visible lower thoracic solid interbody arthrodesis. Posterior element arthrodesis with scant interbody arthrodesis at both T12-L1 and L3-L4. Solid interbody arthrodesis L1-L2 and L2-L3. No hardware loosening at those levels. 2. Chronic pseudoarthrosis suspected at L4-L5. Chronic loosening of the right L5 pedicle screw. 3. Partial healing of bilateral L5 pars fractures since 2022. Chronic L5-S1 adjacent segment degeneration including degenerated right side assimilation joint. 4. No new osseous abnormality identified. 5. Chronic nephrolithiasis, renal cortical scarring, Aortic Atherosclerosis (ICD10-I70.0).     EKG:   CV:  Echo 08/02/23:  IMPRESSIONS    1. Left ventricular ejection fraction, by estimation, is 60 to 65%. The left ventricle has normal function. The left ventricle has no regional wall motion abnormalities. There is mild left ventricular hypertrophy. Left ventricular diastolic parameters are indeterminate. Elevated left ventricular end-diastolic pressure.  2. Right ventricular systolic function is normal. The right ventricular size is normal. There is mildly elevated pulmonary artery systolic pressure. The estimated right ventricular systolic pressure is 39.5 mmHg.  3. The mitral valve is abnormal. Trivial mitral valve regurgitation. No evidence of mitral stenosis. Moderate to severe mitral annular calcification.  4. The aortic valve is tricuspid. There is mild calcification of the aortic valve. Moderate restriction in the aortic leaflet mobility. Aortic valve regurgitation is mild. Moderate aortic valve stenosis. Aortic  valve area, by VTI measures 1.32 cm. Aortic valve mean gradient measures 28.0 mmHg. Aortic valve Vmax measures 3.44 m/s. DVI is 0.33.  5. The inferior vena cava is normal in size with greater than  50% respiratory variability, suggesting right atrial pressure of 3 mmHg.  Comparison(s): No significant change from prior study. Past Medical History:  Diagnosis Date   Arthritis    Bulging lumbar disc    Colon polyps    Complication of anesthesia    hard to wake after back surgery, block for rotator cuff 'didn't work'   Diabetes mellitus without complication (HCC)    Heart murmur    History of gout    History of gout    Hyperlipidemia    Hyperparathyroidism, primary (HCC)    Hypertension    Hypothyroidism    Irritable bowel syndrome    Nocturia    Renal disorder    kidney stones   Seasonal allergies    SVT (supraventricular tachycardia) (HCC)    Thyroid disorder    Urolithiasis     Past Surgical History:  Procedure Laterality Date   ABDOMINAL HYSTERECTOMY  1986   APPENDECTOMY  1954   BACK SURGERY  09/2016   bladder tack  09/1984   CHOLECYSTECTOMY  01/1999   COLONOSCOPY  06/2006   This was her second one   COLONOSCOPY  07/12/2011   Procedure: COLONOSCOPY;  Surgeon: Malissa Hippo, MD;  Location: [facility];  Service: Endoscopy;  Laterality: N/A;  10:30 am   COLONOSCOPY N/A 04/03/2018   Procedure: COLONOSCOPY;  Surgeon: Malissa Hippo, MD;  [facility];  Service: Endoscopy;  Laterality: N/A;  830   CYSTOSCOPY W/ URETERAL STENT PLACEMENT Left 10/03/2014   Procedure: CYSTOSCOPY WITH URETERAL STENT PLACEMENT;  Surgeon: Anner Crete, MD;  Location: [facility];  Service: Urology;  Laterality: Left;   CYSTOSCOPY WITH URETEROSCOPY AND STENT PLACEMENT Left 03/16/2015   Procedure: CYSTOSCOPY WITH LEFT STENT PLACEMENT;  Surgeon: Marcine Matar, MD;  Location: [facility];  Service: Urology;  Laterality: Left;   gout removal  2017   HOLMIUM LASER APPLICATION Left 01/07/2015   Procedure: HOLMIUM LASER APPLICATION;  Surgeon: Marcine Matar, MD;  Location: [facility];  Service: Urology;  Laterality: Left;   KNEE ARTHROSCOPY  12/2010   Left knee   LITHOTRIPSY  06/1995    LUMBAR FUSION  06/2016   NEPHROLITHOTOMY Left 01/07/2015   Procedure: NEPHROLITHOTOMY PERCUTANEOUS;  Surgeon: Marcine Matar, MD;  Location: [facility];  Service: Urology;  Laterality: Left;  With STENT   POLYPECTOMY  04/03/2018   Procedure: POLYPECTOMY;  Surgeon: Malissa Hippo, MD;  [facility];  Service: Endoscopy;;  transverse,splenic flexure, sigmoid   REVERSE SHOULDER ARTHROPLASTY Right 05/10/2022   Procedure: REVERSE SHOULDER ARTHROPLASTY;  Surgeon: Bjorn Pippin, MD;  Location: [facility];  Service: Orthopedics;  Laterality: Right;   Rotor Cuff  2011   Right Shoulder   STENT REMOVAL Left 03/16/2015   Procedure: LEFT URETERAL STENT REMOVAL;  Surgeon: Marcine Matar, MD;  Location: [facility];  Service: Urology;  Laterality: Left;   THYROID EXPLORATION N/A 07/31/2013   Procedure: neck EXPLORATION;  Surgeon: Adolph Pollack, MD;  Location: [facility];  Service: General;  Laterality: N/A;   THYROIDECTOMY N/A 07/31/2013   Procedure: PARATHYROIDECTOMY;  Surgeon: Adolph Pollack, MD;  Location: [facility];  Service: General;  Laterality: N/A;   TUMOR REMOVAL  06/1997   Beign; on back x2 surgeries   TUMOR REMOVAL  2002   "FATTY TUMORS" REMOVED FROM BACK   URETEROSCOPY WITH HOLMIUM LASER LITHOTRIPSY Left 03/16/2015   Procedure: LEFT URETEROSCOPIC LASER LITHOTRIPSY WITH STONE EXTRACTION;  Surgeon: Marcine Matar, MD;  Location: [facility];  Service: Urology;  Laterality: Left;   WISDOM TOOTH EXTRACTION      MEDICATIONS:  allopurinol (ZYLOPRIM) 300 MG tablet   aspirin EC 81 MG tablet   atorvastatin (LIPITOR) 20 MG tablet   Cholecalciferol (VITAMIN D3) 125 MCG (5000 UT) CAPS   gabapentin (NEURONTIN) 300 MG capsule   levothyroxine (SYNTHROID) 50 MCG tablet   losartan (COZAAR) 100 MG tablet   metFORMIN (GLUCOPHAGE) 500 MG tablet   methocarbamol (ROBAXIN) 500 MG tablet   metoprolol succinate (TOPROL-XL) 50 MG 24 hr tablet   oxyCODONE-acetaminophen (PERCOCET/ROXICET) 5-325 MG tablet    polyethylene glycol (MIRALAX / GLYCOLAX) 17 g packet   Potassium Citrate 15 MEQ (1620 MG) TBCR   tiZANidine (ZANAFLEX) 2 MG tablet   trolamine salicylate (ASPERCREME) 10 % cream   zinc gluconate 50 MG tablet   No current facility-administered medications for this encounter.    acetaminophen (TYLENOL) tablet 650 mg   Marcille Blanco [facility] Short Stay/Anesthesiology [facility] Phone [phone] 12/10/2023 3:22 PM

## 2023-12-17 ENCOUNTER — Inpatient Hospital Stay (HOSPITAL_COMMUNITY)
Admission: RE | Admit: 2023-12-17 | Discharge: 2023-12-18 | DRG: 451 | Disposition: A | Discharge status: Home / Self Care | Attending: Neurological Surgery | Admitting: Neurological Surgery

## 2023-12-17 ENCOUNTER — Other Ambulatory Visit: Payer: Self-pay

## 2023-12-17 ENCOUNTER — Inpatient Hospital Stay (HOSPITAL_COMMUNITY): Payer: Self-pay | Admitting: Medical

## 2023-12-17 ENCOUNTER — Encounter (HOSPITAL_COMMUNITY): Payer: Self-pay | Admitting: Neurological Surgery

## 2023-12-17 ENCOUNTER — Encounter (HOSPITAL_COMMUNITY)
Admission: RE | Disposition: A | Discharge status: Home / Self Care | Payer: Self-pay | Source: Home / Self Care | Attending: Neurological Surgery

## 2023-12-17 ENCOUNTER — Inpatient Hospital Stay (HOSPITAL_COMMUNITY)

## 2023-12-17 DIAGNOSIS — E785 Hyperlipidemia, unspecified: Secondary | ICD-10-CM | POA: Diagnosis present

## 2023-12-17 DIAGNOSIS — Z79890 Hormone replacement therapy (postmenopausal): Secondary | ICD-10-CM

## 2023-12-17 DIAGNOSIS — Z8249 Family history of ischemic heart disease and other diseases of the circulatory system: Secondary | ICD-10-CM

## 2023-12-17 DIAGNOSIS — Z79899 Other long term (current) drug therapy: Secondary | ICD-10-CM | POA: Diagnosis not present

## 2023-12-17 DIAGNOSIS — R011 Cardiac murmur, unspecified: Secondary | ICD-10-CM | POA: Diagnosis present

## 2023-12-17 DIAGNOSIS — Y792 Prosthetic and other implants, materials and accessory orthopedic devices associated with adverse incidents: Secondary | ICD-10-CM | POA: Diagnosis present

## 2023-12-17 DIAGNOSIS — M109 Gout, unspecified: Secondary | ICD-10-CM | POA: Diagnosis present

## 2023-12-17 DIAGNOSIS — K589 Irritable bowel syndrome without diarrhea: Secondary | ICD-10-CM | POA: Diagnosis present

## 2023-12-17 DIAGNOSIS — T84038A Mechanical loosening of other internal prosthetic joint, initial encounter: Secondary | ICD-10-CM | POA: Diagnosis present

## 2023-12-17 DIAGNOSIS — I10 Essential (primary) hypertension: Secondary | ICD-10-CM | POA: Diagnosis present

## 2023-12-17 DIAGNOSIS — Z7982 Long term (current) use of aspirin: Secondary | ICD-10-CM

## 2023-12-17 DIAGNOSIS — Z87442 Personal history of urinary calculi: Secondary | ICD-10-CM | POA: Diagnosis not present

## 2023-12-17 DIAGNOSIS — Z8261 Family history of arthritis: Secondary | ICD-10-CM | POA: Diagnosis not present

## 2023-12-17 DIAGNOSIS — Y831 Surgical operation with implant of artificial internal device as the cause of abnormal reaction of the patient, or of later complication, without mention of misadventure at the time of the procedure: Secondary | ICD-10-CM | POA: Diagnosis present

## 2023-12-17 DIAGNOSIS — E890 Postprocedural hypothyroidism: Secondary | ICD-10-CM | POA: Diagnosis present

## 2023-12-17 DIAGNOSIS — Z96611 Presence of right artificial shoulder joint: Secondary | ICD-10-CM | POA: Diagnosis present

## 2023-12-17 DIAGNOSIS — Z888 Allergy status to other drugs, medicaments and biological substances status: Secondary | ICD-10-CM

## 2023-12-17 DIAGNOSIS — M5416 Radiculopathy, lumbar region: Secondary | ICD-10-CM | POA: Diagnosis present

## 2023-12-17 DIAGNOSIS — S32009K Unspecified fracture of unspecified lumbar vertebra, subsequent encounter for fracture with nonunion: Secondary | ICD-10-CM | POA: Diagnosis present

## 2023-12-17 DIAGNOSIS — I471 Supraventricular tachycardia, unspecified: Secondary | ICD-10-CM | POA: Diagnosis present

## 2023-12-17 DIAGNOSIS — E119 Type 2 diabetes mellitus without complications: Secondary | ICD-10-CM | POA: Diagnosis present

## 2023-12-17 DIAGNOSIS — E039 Hypothyroidism, unspecified: Secondary | ICD-10-CM

## 2023-12-17 DIAGNOSIS — Z981 Arthrodesis status: Principal | ICD-10-CM

## 2023-12-17 DIAGNOSIS — Z860100 Personal history of colon polyps, unspecified: Secondary | ICD-10-CM

## 2023-12-17 DIAGNOSIS — M960 Pseudarthrosis after fusion or arthrodesis: Principal | ICD-10-CM | POA: Diagnosis present

## 2023-12-17 DIAGNOSIS — Z9889 Other specified postprocedural states: Secondary | ICD-10-CM

## 2023-12-17 DIAGNOSIS — Z90710 Acquired absence of both cervix and uterus: Secondary | ICD-10-CM

## 2023-12-17 DIAGNOSIS — M4326 Fusion of spine, lumbar region: Secondary | ICD-10-CM | POA: Diagnosis not present

## 2023-12-17 DIAGNOSIS — E669 Obesity, unspecified: Secondary | ICD-10-CM | POA: Diagnosis present

## 2023-12-17 DIAGNOSIS — Z6838 Body mass index (BMI) 38.0-38.9, adult: Secondary | ICD-10-CM | POA: Diagnosis not present

## 2023-12-17 DIAGNOSIS — Z9089 Acquired absence of other organs: Secondary | ICD-10-CM

## 2023-12-17 DIAGNOSIS — Z7984 Long term (current) use of oral hypoglycemic drugs: Secondary | ICD-10-CM

## 2023-12-17 DIAGNOSIS — Z9049 Acquired absence of other specified parts of digestive tract: Secondary | ICD-10-CM | POA: Diagnosis not present

## 2023-12-17 LAB — GLUCOSE, CAPILLARY
Glucose-Capillary: 116 mg/dL — ABNORMAL HIGH (ref 70–99)
Glucose-Capillary: 107 mg/dL — ABNORMAL HIGH (ref 70–99)
Glucose-Capillary: 117 mg/dL — ABNORMAL HIGH (ref 70–99)
Glucose-Capillary: 213 mg/dL — ABNORMAL HIGH (ref 70–99)
Glucose-Capillary: 218 mg/dL — ABNORMAL HIGH (ref 70–99)

## 2023-12-17 SURGERY — LAMINECTOMY WITH POSTERIOR LATERAL ARTHRODESIS LEVEL 1
Anesthesia: General | Site: Back

## 2023-12-17 MED ORDER — ACETAMINOPHEN 10 MG/ML IV SOLN
1000.0000 mg | Freq: Once | INTRAVENOUS | Status: DC | PRN
  Administered 2023-12-17: 1000 mg via INTRAVENOUS

## 2023-12-17 MED ORDER — 0.9 % SODIUM CHLORIDE (POUR BTL) OPTIME
TOPICAL | Status: DC | PRN
  Administered 2023-12-17: 1000 mL

## 2023-12-17 MED ORDER — CEFAZOLIN SODIUM-DEXTROSE 2-4 GM/100ML-% IV SOLN
2.0000 g | Freq: Three times a day (TID) | INTRAVENOUS | Status: AC
  Administered 2023-12-17 – 2023-12-18 (×2): 2 g via INTRAVENOUS
  Filled 2023-12-17 (×2): qty 100

## 2023-12-17 MED ORDER — FENTANYL CITRATE (PF) 100 MCG/2ML IJ SOLN
INTRAMUSCULAR | Status: AC
  Filled 2023-12-17: qty 2

## 2023-12-17 MED ORDER — TIZANIDINE HCL 4 MG PO TABS: 2.0000 mg | ORAL_TABLET | Freq: Three times a day (TID) | ORAL | Status: DC | PRN

## 2023-12-17 MED ORDER — POTASSIUM CITRATE ER 10 MEQ (1080 MG) PO TBCR
10.0000 meq | EXTENDED_RELEASE_TABLET | Freq: Two times a day (BID) | ORAL | Status: DC
  Administered 2023-12-17: 10 meq via ORAL
  Filled 2023-12-17 (×3): qty 1

## 2023-12-17 MED ORDER — INSULIN ASPART 100 UNIT/ML IJ SOLN: 0.0000 [IU] | INTRAMUSCULAR | Status: DC | PRN

## 2023-12-17 MED ORDER — LIDOCAINE 2% (20 MG/ML) 5 ML SYRINGE
INTRAMUSCULAR | Status: DC | PRN
  Administered 2023-12-17: 60 mg via INTRAVENOUS

## 2023-12-17 MED ORDER — PROPOFOL 10 MG/ML IV BOLUS
INTRAVENOUS | Status: DC | PRN
  Administered 2023-12-17: 150 mg via INTRAVENOUS

## 2023-12-17 MED ORDER — ONDANSETRON HCL 4 MG/2ML IJ SOLN
INTRAMUSCULAR | Status: AC
  Filled 2023-12-17: qty 2

## 2023-12-17 MED ORDER — LACTATED RINGERS IV SOLN
INTRAVENOUS | Status: DC
Start: 2023-12-17 — End: 2023-12-17

## 2023-12-17 MED ORDER — ACETAMINOPHEN 325 MG PO TABS: 650.0000 mg | ORAL_TABLET | ORAL | Status: DC | PRN

## 2023-12-17 MED ORDER — CHLORHEXIDINE GLUCONATE 0.12 % MT SOLN: 15.0000 mL | Freq: Once | OROMUCOSAL | Status: DC

## 2023-12-17 MED ORDER — MENTHOL 3 MG MT LOZG: 1.0000 | LOZENGE | OROMUCOSAL | Status: DC | PRN

## 2023-12-17 MED ORDER — THROMBIN 20000 UNITS EX SOLR
CUTANEOUS | Status: AC
  Filled 2023-12-17: qty 20000

## 2023-12-17 MED ORDER — LOSARTAN POTASSIUM 50 MG PO TABS
100.0000 mg | ORAL_TABLET | Freq: Every day | ORAL | Status: DC
  Administered 2023-12-17 – 2023-12-18 (×2): 100 mg via ORAL
  Filled 2023-12-17 (×2): qty 2

## 2023-12-17 MED ORDER — FENTANYL CITRATE (PF) 100 MCG/2ML IJ SOLN
25.0000 ug | INTRAMUSCULAR | Status: DC | PRN
  Administered 2023-12-17 (×3): 50 ug via INTRAVENOUS

## 2023-12-17 MED ORDER — GABAPENTIN 300 MG PO CAPS
300.0000 mg | ORAL_CAPSULE | Freq: Every day | ORAL | Status: DC
  Administered 2023-12-17: 300 mg via ORAL
  Filled 2023-12-17: qty 1

## 2023-12-17 MED ORDER — FENTANYL CITRATE (PF) 250 MCG/5ML IJ SOLN
INTRAMUSCULAR | Status: AC
  Filled 2023-12-17: qty 5

## 2023-12-17 MED ORDER — SODIUM CHLORIDE 0.9% FLUSH
3.0000 mL | Freq: Two times a day (BID) | INTRAVENOUS | Status: DC
  Administered 2023-12-17 – 2023-12-18 (×2): 3 mL via INTRAVENOUS

## 2023-12-17 MED ORDER — METHOCARBAMOL 500 MG PO TABS
500.0000 mg | ORAL_TABLET | Freq: Once | ORAL | Status: AC
  Administered 2023-12-17: 500 mg via ORAL

## 2023-12-17 MED ORDER — CEFAZOLIN SODIUM-DEXTROSE 2-4 GM/100ML-% IV SOLN
INTRAVENOUS | Status: AC
  Filled 2023-12-17: qty 100

## 2023-12-17 MED ORDER — SUGAMMADEX SODIUM 200 MG/2ML IV SOLN
INTRAVENOUS | Status: DC | PRN
  Administered 2023-12-17: 200 mg via INTRAVENOUS

## 2023-12-17 MED ORDER — PROPOFOL 10 MG/ML IV BOLUS
INTRAVENOUS | Status: AC
  Filled 2023-12-17: qty 20

## 2023-12-17 MED ORDER — BUPIVACAINE HCL (PF) 0.25 % IJ SOLN
INTRAMUSCULAR | Status: AC
  Filled 2023-12-17: qty 30

## 2023-12-17 MED ORDER — SENNA 8.6 MG PO TABS
1.0000 | ORAL_TABLET | Freq: Two times a day (BID) | ORAL | Status: DC
  Administered 2023-12-17 – 2023-12-18 (×2): 8.6 mg via ORAL
  Filled 2023-12-17 (×2): qty 1

## 2023-12-17 MED ORDER — VANCOMYCIN HCL 1000 MG IV SOLR
INTRAVENOUS | Status: AC
  Filled 2023-12-17: qty 20

## 2023-12-17 MED ORDER — SODIUM CHLORIDE 0.9% FLUSH
3.0000 mL | Freq: Two times a day (BID) | INTRAVENOUS | Status: DC
  Administered 2023-12-18: 3 mL via INTRAVENOUS

## 2023-12-17 MED ORDER — THROMBIN 5000 UNITS EX KIT
PACK | CUTANEOUS | Status: AC
  Filled 2023-12-17: qty 1

## 2023-12-17 MED ORDER — BUPIVACAINE HCL (PF) 0.25 % IJ SOLN
INTRAMUSCULAR | Status: DC | PRN
  Administered 2023-12-17: 10 mL via INTRA_ARTICULAR

## 2023-12-17 MED ORDER — SODIUM CHLORIDE 0.9% FLUSH: 3.0000 mL | INTRAVENOUS | Status: DC | PRN

## 2023-12-17 MED ORDER — ORAL CARE MOUTH RINSE: 15.0000 mL | Freq: Once | OROMUCOSAL | Status: DC

## 2023-12-17 MED ORDER — ROCURONIUM BROMIDE 10 MG/ML (PF) SYRINGE
PREFILLED_SYRINGE | INTRAVENOUS | Status: DC | PRN
  Administered 2023-12-17: 20 mg via INTRAVENOUS
  Administered 2023-12-17: 50 mg via INTRAVENOUS

## 2023-12-17 MED ORDER — OXYCODONE HCL 5 MG PO TABS
5.0000 mg | ORAL_TABLET | Freq: Once | ORAL | Status: AC
  Administered 2023-12-17: 5 mg via ORAL

## 2023-12-17 MED ORDER — CELECOXIB 200 MG PO CAPS
200.0000 mg | ORAL_CAPSULE | Freq: Two times a day (BID) | ORAL | Status: DC
  Administered 2023-12-17 – 2023-12-18 (×2): 200 mg via ORAL
  Filled 2023-12-17 (×2): qty 1

## 2023-12-17 MED ORDER — FENTANYL CITRATE (PF) 250 MCG/5ML IJ SOLN
INTRAMUSCULAR | Status: DC | PRN
  Administered 2023-12-17: 50 ug via INTRAVENOUS
  Administered 2023-12-17: 100 ug via INTRAVENOUS
  Administered 2023-12-17: 50 ug via INTRAVENOUS

## 2023-12-17 MED ORDER — CHLORHEXIDINE GLUCONATE 0.12 % MT SOLN
15.0000 mL | Freq: Once | OROMUCOSAL | Status: AC
  Administered 2023-12-17: 15 mL via OROMUCOSAL
  Filled 2023-12-17: qty 15

## 2023-12-17 MED ORDER — ONDANSETRON HCL 4 MG/2ML IJ SOLN: 4.0000 mg | Freq: Four times a day (QID) | INTRAMUSCULAR | Status: DC | PRN

## 2023-12-17 MED ORDER — THROMBIN 5000 UNITS EX SOLR
OROMUCOSAL | Status: DC | PRN
  Administered 2023-12-17: 5 mL via TOPICAL

## 2023-12-17 MED ORDER — OXYCODONE HCL 5 MG PO TABS
ORAL_TABLET | ORAL | Status: AC
  Filled 2023-12-17: qty 1

## 2023-12-17 MED ORDER — ONDANSETRON HCL 4 MG/2ML IJ SOLN
INTRAMUSCULAR | Status: DC | PRN
  Administered 2023-12-17: 4 mg via INTRAVENOUS

## 2023-12-17 MED ORDER — ONDANSETRON HCL 4 MG/2ML IJ SOLN: 4.0000 mg | Freq: Once | INTRAMUSCULAR | Status: DC | PRN

## 2023-12-17 MED ORDER — HYDRALAZINE HCL 20 MG/ML IJ SOLN
INTRAMUSCULAR | Status: AC
  Filled 2023-12-17: qty 1

## 2023-12-17 MED ORDER — CEFAZOLIN SODIUM-DEXTROSE 2-3 GM-%(50ML) IV SOLR
INTRAVENOUS | Status: DC | PRN
  Administered 2023-12-17: 2 g via INTRAVENOUS

## 2023-12-17 MED ORDER — THROMBIN 20000 UNITS EX SOLR
CUTANEOUS | Status: DC | PRN
  Administered 2023-12-17: 20 mL via TOPICAL

## 2023-12-17 MED ORDER — MORPHINE SULFATE (PF) 2 MG/ML IV SOLN: 2.0000 mg | INTRAVENOUS | Status: DC | PRN

## 2023-12-17 MED ORDER — PHENOL 1.4 % MT LIQD: 1.0000 | OROMUCOSAL | Status: DC | PRN

## 2023-12-17 MED ORDER — OXYCODONE-ACETAMINOPHEN 5-325 MG PO TABS
1.0000 | ORAL_TABLET | Freq: Three times a day (TID) | ORAL | Status: DC | PRN
  Administered 2023-12-17 – 2023-12-18 (×3): 1 via ORAL
  Filled 2023-12-17 (×3): qty 1

## 2023-12-17 MED ORDER — ACETAMINOPHEN 650 MG RE SUPP: 650.0000 mg | RECTAL | Status: DC | PRN

## 2023-12-17 MED ORDER — DEXAMETHASONE SODIUM PHOSPHATE 10 MG/ML IJ SOLN
INTRAMUSCULAR | Status: AC
  Filled 2023-12-17: qty 1

## 2023-12-17 MED ORDER — LIDOCAINE 2% (20 MG/ML) 5 ML SYRINGE
INTRAMUSCULAR | Status: AC
  Filled 2023-12-17: qty 5

## 2023-12-17 MED ORDER — DEXAMETHASONE SODIUM PHOSPHATE 10 MG/ML IJ SOLN
INTRAMUSCULAR | Status: DC | PRN
  Administered 2023-12-17: 5 mg via INTRAVENOUS

## 2023-12-17 MED ORDER — ORAL CARE MOUTH RINSE: 15.0000 mL | Freq: Once | OROMUCOSAL | Status: AC

## 2023-12-17 MED ORDER — ROCURONIUM BROMIDE 10 MG/ML (PF) SYRINGE
PREFILLED_SYRINGE | INTRAVENOUS | Status: AC
  Filled 2023-12-17: qty 10

## 2023-12-17 MED ORDER — SODIUM CHLORIDE 0.9 % IV SOLN: 250.0000 mL | INTRAVENOUS | Status: DC

## 2023-12-17 MED ORDER — METOPROLOL SUCCINATE ER 50 MG PO TB24
50.0000 mg | ORAL_TABLET | Freq: Every day | ORAL | Status: DC
  Administered 2023-12-18: 50 mg via ORAL
  Filled 2023-12-17: qty 1

## 2023-12-17 MED ORDER — ZINC GLUCONATE 50 MG PO TABS: 50.0000 mg | ORAL_TABLET | Freq: Every day | ORAL | Status: DC

## 2023-12-17 MED ORDER — METFORMIN HCL 500 MG PO TABS
500.0000 mg | ORAL_TABLET | Freq: Every day | ORAL | Status: DC
  Administered 2023-12-18: 500 mg via ORAL
  Filled 2023-12-17: qty 1

## 2023-12-17 MED ORDER — ONDANSETRON HCL 4 MG PO TABS: 4.0000 mg | ORAL_TABLET | Freq: Four times a day (QID) | ORAL | Status: DC | PRN

## 2023-12-17 MED ORDER — LEVOTHYROXINE SODIUM 25 MCG PO TABS
50.0000 ug | ORAL_TABLET | Freq: Every day | ORAL | Status: DC
  Administered 2023-12-18: 50 ug via ORAL
  Filled 2023-12-17: qty 2

## 2023-12-17 MED ORDER — HYDRALAZINE HCL 20 MG/ML IJ SOLN
5.0000 mg | Freq: Once | INTRAMUSCULAR | Status: AC
Start: 2023-12-17 — End: 2023-12-17
  Administered 2023-12-17: 5 mg via INTRAVENOUS

## 2023-12-17 MED ORDER — METHOCARBAMOL 500 MG PO TABS
ORAL_TABLET | ORAL | Status: AC
  Filled 2023-12-17: qty 1

## 2023-12-17 MED ORDER — PHENYLEPHRINE 80 MCG/ML (10ML) SYRINGE FOR IV PUSH (FOR BLOOD PRESSURE SUPPORT)
PREFILLED_SYRINGE | INTRAVENOUS | Status: AC
  Filled 2023-12-17: qty 10

## 2023-12-17 MED ORDER — AMISULPRIDE (ANTIEMETIC) 5 MG/2ML IV SOLN: 10.0000 mg | Freq: Once | INTRAVENOUS | Status: DC | PRN

## 2023-12-17 SURGICAL SUPPLY — 49 items
ALLOGRAFT BONESTRIP KORE 2.5X5 (Bone Implant) IMPLANT
BAG COUNTER SPONGE SURGICOUNT (BAG) ×1 IMPLANT
BASKET BONE COLLECTION (BASKET) IMPLANT
BENZOIN TINCTURE PRP APPL 2/3 (GAUZE/BANDAGES/DRESSINGS) ×1 IMPLANT
BLADE BONE MILL MEDIUM (MISCELLANEOUS) IMPLANT
BLADE CLIPPER SURG (BLADE) IMPLANT
BUR CARBIDE MATCH 3.0 (BURR) ×1 IMPLANT
CANISTER SUCT 3000ML PPV (MISCELLANEOUS) ×1 IMPLANT
CLEANSER WND VASHE 34 (WOUND CARE) ×1 IMPLANT
CNTNR URN SCR LID CUP LEK RST (MISCELLANEOUS) ×1 IMPLANT
COVER BACK TABLE 60X90IN (DRAPES) ×1 IMPLANT
DERMABOND ADVANCED .7 DNX12 (GAUZE/BANDAGES/DRESSINGS) IMPLANT
DRAPE C-ARM 42X72 X-RAY (DRAPES) IMPLANT
DRAPE LAPAROTOMY 100X72X124 (DRAPES) ×1 IMPLANT
DRAPE SURG 17X23 STRL (DRAPES) ×1 IMPLANT
DRSG OPSITE 4X5.5 SM (GAUZE/BANDAGES/DRESSINGS) IMPLANT
DRSG OPSITE POSTOP 4X6 (GAUZE/BANDAGES/DRESSINGS) IMPLANT
DURAPREP 26ML APPLICATOR (WOUND CARE) ×1 IMPLANT
ELECT REM PT RETURN 9FT ADLT (ELECTROSURGICAL) ×1 IMPLANT
ELECTRODE REM PT RTRN 9FT ADLT (ELECTROSURGICAL) ×1 IMPLANT
EVACUATOR 1/8 PVC DRAIN (DRAIN) IMPLANT
GAUZE 4X4 16PLY ~~LOC~~+RFID DBL (SPONGE) IMPLANT
GAUZE SPONGE 4X4 12PLY STRL (GAUZE/BANDAGES/DRESSINGS) IMPLANT
GLOVE BIO SURGEON STRL SZ7 (GLOVE) ×1 IMPLANT
GLOVE BIO SURGEON STRL SZ8 (GLOVE) ×2 IMPLANT
GLOVE BIOGEL PI IND STRL 7.0 (GLOVE) ×1 IMPLANT
GOWN STRL REUS W/ TWL LRG LVL3 (GOWN DISPOSABLE) IMPLANT
GOWN STRL REUS W/ TWL XL LVL3 (GOWN DISPOSABLE) ×2 IMPLANT
GOWN STRL REUS W/TWL 2XL LVL3 (GOWN DISPOSABLE) IMPLANT
GRAFT BN 10X1XDBM MAGNIFUSE (Bone Implant) IMPLANT
HEMOSTAT POWDER KIT SURGIFOAM (HEMOSTASIS) ×1 IMPLANT
KIT BASIN OR (CUSTOM PROCEDURE TRAY) ×1 IMPLANT
KIT INFUSE SMALL (Orthopedic Implant) IMPLANT
KIT TURNOVER KIT B (KITS) ×1 IMPLANT
MILL BONE PREP (MISCELLANEOUS) IMPLANT
NDL HYPO 25X1 1.5 SAFETY (NEEDLE) ×1 IMPLANT
NEEDLE HYPO 25X1 1.5 SAFETY (NEEDLE) ×1 IMPLANT
NS IRRIG 1000ML POUR BTL (IV SOLUTION) ×1 IMPLANT
PACK LAMINECTOMY NEURO (CUSTOM PROCEDURE TRAY) ×1 IMPLANT
PAD ARMBOARD POSITIONER FOAM (MISCELLANEOUS) ×3 IMPLANT
SPONGE SURGIFOAM ABS GEL 100 (HEMOSTASIS) ×1 IMPLANT
SPONGE T-LAP 4X18 ~~LOC~~+RFID (SPONGE) IMPLANT
STRIP CLOSURE SKIN 1/2X4 (GAUZE/BANDAGES/DRESSINGS) ×2 IMPLANT
SUT VIC AB 0 CT1 18XCR BRD8 (SUTURE) ×1 IMPLANT
SUT VIC AB 2-0 CP2 18 (SUTURE) ×1 IMPLANT
SUT VIC AB 3-0 SH 8-18 (SUTURE) ×2 IMPLANT
TOWEL GREEN STERILE (TOWEL DISPOSABLE) ×1 IMPLANT
TOWEL GREEN STERILE FF (TOWEL DISPOSABLE) ×1 IMPLANT
WATER STERILE IRR 1000ML POUR (IV SOLUTION) ×1 IMPLANT

## 2023-12-17 NOTE — Op Note (Signed)
 12/17/2023  1:28 PM  PATIENT:  Natasha Warren  78 y.o. 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  Removal of instrumentation L2-L5 3. Intertransverse arthrodesis L4-5 bilaterally using morcellized allograft.  SURGEON:  Marikay Alar, MD  ASSISTANTS: elsner, MD  ANESTHESIA:  General  EBL: 250 ml  No intake/output data recorded.  BLOOD ADMINISTERED:none  DRAINS: none   INDICATION FOR PROCEDURE: This patient presented with back pain with leg pain.  She had a previous thoracic or lumbar fusion by Dr. Venetia Maxon.  She had a known pseudoarthrosis at L4-5 with loosening of instrumentation.. Imaging revealed pseudoarthrosis L4-5 with solid arthrodesis elsewhere. The patient tried a reasonable attempt at conservative medical measures without relief. I recommended lumbar exploration with extraction of the loosened instrumentation and redo fusion at L4-5. patient understood the risks, benefits, and alternatives and potential outcomes and wished to proceed.  PROCEDURE DETAILS:  The patient was brought to the operating room. After induction of generalized endotracheal anesthesia the patient was rolled into the prone position on chest rolls and all pressure points were padded. The patient's lumbar region was cleaned and then prepped with DuraPrep and draped in the usual sterile fashion. Anesthesia was injected and then a dorsal midline incision was made and carried down to the lumbosacral fascia. The fascia was opened and the paraspinous musculature was taken down in a subperiosteal fashion to expose the old instrumentation L2-L5 on the right L3-L5 on the left.  Locking caps were removed and the rods were removed on both sides.  The right L5 pedicle screw was loose.  We explored the fusion  to confirm pseudoarthrosis at L4-5 with solid arthrodesis L2-3 and L3-4.  We removed the pedicle screws at L4 and L5 on the left and L5 on the right.  We exposed the transverse processes of L4 and L5 bilaterally.  We then decorticated the transverse processes and laid a mixture of morcellized allograft out over these to perform intertransverse arthrodesis at L4-5 bilaterally. Irrigated with copious amounts of  saline solution. we closed the muscle and the fascia with 0 Vicryl. Closed the subcutaneous tissues with 2-0 Vicryl and subcuticular tissues with 3-0 Vicryl. The skin was closed with Dermabond, benzoin and Steri-Strips. Dressing was then applied, the patient was awakened from general anesthesia and transported to the recovery room in stable condition. At the end of the procedure all sponge, needle and instrument counts were correct.   PLAN OF CARE: admit to inpatient  PATIENT DISPOSITION:  PACU - hemodynamically stable.   Delay start of Pharmacological VTE agent (>24hrs) due to surgical blood loss or risk of bleeding:  yes

## 2023-12-17 NOTE — Anesthesia Procedure Notes (Signed)
 Procedure Name: Intubation Date/Time: 12/17/2023 11:51 AM  Performed by: Camillia Herter, CRNAPre-anesthesia Checklist: Patient identified, Emergency Drugs available, Suction available and Patient being monitored Patient Re-evaluated:Patient Re-evaluated prior to induction Oxygen Delivery Method: Circle System Utilized Preoxygenation: Pre-oxygenation with 100% oxygen Induction Type: IV induction Ventilation: Mask ventilation without difficulty Laryngoscope Size: Miller and 2 Grade View: Grade I Tube type: Oral Tube size: 7.5 mm Number of attempts: 1 Airway Equipment and Method: Stylet and Oral airway Placement Confirmation: ETT inserted through vocal cords under direct vision, positive ETCO2 and breath sounds checked- equal and bilateral Secured at: 22 cm Tube secured with: Tape Dental Injury: Teeth and Oropharynx as per pre-operative assessment

## 2023-12-17 NOTE — Anesthesia Postprocedure Evaluation (Signed)
 Anesthesia Post Note  Patient: Natasha Warren  Procedure(s) Performed: explore previous lumbar fusion lumbar two-lumbar five,extraction of loosened  instrumentation redo fusion lumbar four-five (Back)     Patient location during evaluation: PACU Anesthesia Type: General Level of consciousness: awake Pain management: pain level controlled Vital Signs Assessment: post-procedure vital signs reviewed and stable Respiratory status: spontaneous breathing, nonlabored ventilation and respiratory function stable Cardiovascular status: blood pressure returned to baseline and stable Postop Assessment: no apparent nausea or vomiting Anesthetic complications: no   No notable events documented.  Last Vitals:  Vitals:   12/17/23 1500 12/17/23 1557  BP: (!) 154/73 (!) 176/86  Pulse: 64 66  Resp: 14 18  Temp: 36.6 C 36.6 C  SpO2: 97% 100%    Last Pain:  Vitals:   12/17/23 1524  TempSrc:   PainSc: 4                  Airica Schwartzkopf P Quint Chestnut

## 2023-12-17 NOTE — Transfer of Care (Signed)
 Immediate Anesthesia Transfer of Care Note  Patient: Natasha Warren  Procedure(s) Performed: explore previous lumbar fusion lumbar two-lumbar five,extraction of loosened  instrumentation redo fusion lumbar four-five (Back)  Patient Location: PACU  Anesthesia Type:General  Level of Consciousness: awake, alert , and oriented  Airway & Oxygen Therapy: Patient Spontanous Breathing and Patient connected to face mask oxygen  Post-op Assessment: Report given to RN and Post -op Vital signs reviewed and stable  Post vital signs: Reviewed and stable  Last Vitals:  Vitals Value Taken Time  BP 195/98 12/17/23 1335  Temp    Pulse 65 12/17/23 1340  Resp 14 12/17/23 1340  SpO2 99 % 12/17/23 1340  Vitals shown include unfiled device data.  Last Pain:  Vitals:   12/17/23 0907  TempSrc:   PainSc: 3       Patients Stated Pain Goal: 3 (12/17/23 0907)  Complications: No notable events documented.

## 2023-12-17 NOTE — H&P (Signed)
 Subjective: Patient is a 78 y.o. female admitted for pseudoarthrosis L4-5 after previous L2-L5 fusion by another surgeon. Onset of symptoms was several months ago, gradually worsening since that time.  The pain is rated severe, and is located at the across the lower back and radiates to legs. The pain is described as aching and occurs all day. The symptoms have been progressive. Symptoms are exacerbated by exercise. MRI or CT showed due to arthrosis L4-5 with loosening of instrumentation with probable solid arthrodesis L2-3 L3-4  Past Medical History:  Diagnosis Date   Arthritis    Bulging lumbar disc    Colon polyps    Complication of anesthesia    hard to wake after back surgery, block for rotator cuff 'didn't work'   Diabetes mellitus without complication (HCC)    Heart murmur    History of gout    History of gout    Hyperlipidemia    Hyperparathyroidism, primary (HCC)    Hypertension    Hypothyroidism    Irritable bowel syndrome    Nocturia    Renal disorder    kidney stones   Seasonal allergies    SVT (supraventricular tachycardia) (HCC)    Thyroid disorder    Urolithiasis     Past Surgical History:  Procedure Laterality Date   ABDOMINAL HYSTERECTOMY  1986   APPENDECTOMY  1954   BACK SURGERY  09/2016   bladder tack  09/1984   CHOLECYSTECTOMY  01/1999   COLONOSCOPY  06/2006   This was her second one   COLONOSCOPY  07/12/2011   Procedure: COLONOSCOPY;  Surgeon: Malissa Hippo, MD;  Location: [facility];  Service: Endoscopy;  Laterality: N/A;  10:30 am   COLONOSCOPY N/A 04/03/2018   Procedure: COLONOSCOPY;  Surgeon: Malissa Hippo, MD;  Location: [facility];  Service: Endoscopy;  Laterality: N/A;  830   CYSTOSCOPY W/ URETERAL STENT PLACEMENT Left 10/03/2014   Procedure: CYSTOSCOPY WITH URETERAL STENT PLACEMENT;  Surgeon: Anner Crete, MD;  Location: [facility];  Service: Urology;  Laterality: Left;   CYSTOSCOPY WITH URETEROSCOPY AND STENT PLACEMENT Left 03/16/2015    Procedure: CYSTOSCOPY WITH LEFT STENT PLACEMENT;  Surgeon: Marcine Matar, MD;  Location: [facility];  Service: Urology;  Laterality: Left;   gout removal  2017   HOLMIUM LASER APPLICATION Left 01/07/2015   Procedure: HOLMIUM LASER APPLICATION;  Surgeon: Marcine Matar, MD;  Location: [facility];  Service: Urology;  Laterality: Left;   KNEE ARTHROSCOPY  12/2010   Left knee   LITHOTRIPSY  06/1995   LUMBAR FUSION  06/2016   NEPHROLITHOTOMY Left 01/07/2015   Procedure: NEPHROLITHOTOMY PERCUTANEOUS;  Surgeon: Marcine Matar, MD;  Location: [facility];  Service: Urology;  Laterality: Left;  With STENT   POLYPECTOMY  04/03/2018   Procedure: POLYPECTOMY;  Surgeon: Malissa Hippo, MD;  Location: [facility];  Service: Endoscopy;;  transverse,splenic flexure, sigmoid   REVERSE SHOULDER ARTHROPLASTY Right 05/10/2022   Procedure: REVERSE SHOULDER ARTHROPLASTY;  Surgeon: Bjorn Pippin, MD;  Location: [facility];  Service: Orthopedics;  Laterality: Right;   Rotor Cuff  2011   Right Shoulder   STENT REMOVAL Left 03/16/2015   Procedure: LEFT URETERAL STENT REMOVAL;  Surgeon: Marcine Matar, MD;  Location: [facility];  Service: Urology;  Laterality: Left;   THYROID EXPLORATION N/A 07/31/2013   Procedure: neck EXPLORATION;  Surgeon: Adolph Pollack, MD;  Location: [facility];  Service: General;  Laterality: N/A;   THYROIDECTOMY N/A 07/31/2013   Procedure: PARATHYROIDECTOMY;  Surgeon: Adolph Pollack, MD;  Location: [facility];  Service: General;  Laterality: N/A;   TUMOR REMOVAL  06/1997   Beign; on back x2 surgeries   TUMOR REMOVAL  2002   "FATTY TUMORS" REMOVED FROM BACK   URETEROSCOPY WITH HOLMIUM LASER LITHOTRIPSY Left 03/16/2015   Procedure: LEFT URETEROSCOPIC LASER LITHOTRIPSY WITH STONE EXTRACTION;  Surgeon: Marcine Matar, MD;  Location: [facility];  Service: Urology;  Laterality: Left;   WISDOM TOOTH EXTRACTION      Prior to Admission medications   Medication Sig Start Date End Date Taking? Authorizing  Provider  allopurinol (ZYLOPRIM) 300 MG tablet Take 300 mg by mouth daily.  05/14/16  Yes [provider]  aspirin EC 81 MG tablet Take 81 mg by mouth daily. Swallow whole.   Yes [provider]  atorvastatin (LIPITOR) 20 MG tablet Take 20 mg by mouth daily.   Yes [provider]  Cholecalciferol (VITAMIN D3) 125 MCG (5000 UT) CAPS Take 5,000 Units by mouth daily.   Yes [provider]  gabapentin (NEURONTIN) 300 MG capsule Take 300 mg by mouth at bedtime.   Yes [provider]  levothyroxine (SYNTHROID) 50 MCG tablet Take 50 mcg by mouth daily before breakfast.   Yes [provider]  losartan (COZAAR) 100 MG tablet Take 100 mg by mouth daily.  03/17/16  Yes [provider]  metFORMIN (GLUCOPHAGE) 500 MG tablet Take 500 mg by mouth daily. 02/25/20  Yes [provider]  metoprolol succinate (TOPROL-XL) 50 MG 24 hr tablet Take 1 & 1/2 tablets daily. 09/28/15  Yes Branch, Dorothe Pea, MD  oxyCODONE-acetaminophen (PERCOCET/ROXICET) 5-325 MG tablet Take 1 tablet by mouth 3 (three) times daily as needed for moderate pain (pain score 4-6).   Yes [provider]  polyethylene glycol (MIRALAX / GLYCOLAX) 17 g packet Take 17 g by mouth daily.   Yes [provider]  Potassium Citrate 15 MEQ (1620 MG) TBCR Take 1 tablet by mouth 2 (two) times daily. 12/10/23  Yes Dahlstedt, Jeannett Senior, MD  tiZANidine (ZANAFLEX) 2 MG tablet Take 2 mg by mouth every 8 (eight) hours as needed for muscle spasms. 11/13/23  Yes [provider]  trolamine salicylate (ASPERCREME) 10 % cream Apply 1 Application topically as needed for muscle pain.   Yes [provider]  zinc gluconate 50 MG tablet Take 50 mg by mouth daily.   Yes [provider]  methocarbamol (ROBAXIN) 500 MG tablet Take 1 tablet (500 mg total) by mouth every 8 (eight) hours as needed for muscle spasms. Patient not taking: Reported on 12/05/2023 05/11/22   Vernetta Honey, PA-C   Allergies  Allergen Reactions   Venlafaxine Anxiety    Social History   Tobacco Use   Smoking status: Never   Smokeless tobacco: Never  Substance Use Topics   Alcohol use: Not Currently    Comment: Very Rarely 1/2 glass of beer    Family History  Problem Relation Age of Onset   Hypertension Mother    Arthritis Mother    Hypertension Father    Healthy Brother    Healthy Daughter    Healthy Daughter    Colon cancer Neg Hx      Review of Systems  Positive ROS: Negative  All other systems have been reviewed and were otherwise negative with the exception of those mentioned in the HPI and as above.  Objective: Vital signs in last 24 hours: Temp:  [98.3 F (36.8 C)] 98.3 F (  36.8 C) (03/31 0840) Pulse Rate:  [60] 60 (03/31 0840) Resp:  [20] 20 (03/31 0840) BP: (189)/(85) 189/85 (03/31 0840) SpO2:  [95 %] 95 % (03/31 0840) Weight:  [95.3 kg] 95.3 kg (03/31 0840)  General Appearance: Alert, cooperative, no distress, appears stated age Head: Normocephalic, without obvious abnormality, atraumatic Eyes: PERRL, conjunctiva/corneas clear, EOM's intact    Neck: Supple, symmetrical, trachea midline Back: Symmetric, no curvature, ROM normal, no CVA tenderness Lungs:  respirations unlabored Heart: Regular rate and rhythm Abdomen: Soft, non-tender Extremities: Extremities normal, atraumatic, no cyanosis or edema Pulses: 2+ and symmetric all extremities Skin: Skin color, texture, turgor normal, no rashes or lesions  NEUROLOGIC:   Mental status: Alert and oriented x4,  no aphasia, good attention span, fund of knowledge, and memory Motor Exam - grossly normal Sensory Exam - grossly normal Reflexes: 1+ Coordination - grossly normal Gait - grossly normal Balance - grossly normal Cranial Nerves: I: smell Not tested  II: visual acuity  OS: nl    OD: nl  II: visual fields Full to confrontation  II: pupils Equal, round, reactive to light  III,VII: ptosis  None  III,IV,VI: extraocular muscles  Full ROM  V: mastication Normal  V: facial light touch sensation  Normal  V,VII: corneal reflex  Present  VII: facial muscle function - upper  Normal  VII: facial muscle function - lower Normal  VIII: hearing Not tested  IX: soft palate elevation  Normal  IX,X: gag reflex Present  XI: trapezius strength  5/5  XI: sternocleidomastoid strength 5/5  XI: neck flexion strength  5/5  XII: tongue strength  Normal    Data Review Lab Results  Component Value Date   WBC 8.1 12/07/2023   HGB 14.4 12/07/2023   HCT 46.4 (H) 12/07/2023   MCV 95.5 12/07/2023   PLT 258 12/07/2023   Lab Results  Component Value Date   NA 141 12/07/2023   K 4.7 12/07/2023   CL 104 12/07/2023   CO2 27 12/07/2023   BUN 26 (H) 12/07/2023   CREATININE 1.13 (H) 12/07/2023   GLUCOSE 87 12/07/2023   Lab Results  Component Value Date   INR 1.05 01/07/2015    Assessment/Plan:  Estimated body mass index is 38.41 kg/m as calculated from the following:   Height as of this encounter: 5\' 2"  (1.575 m).   Weight as of this encounter: 95.3 kg. Patient admitted for lumbar reexploration with extraction of loosened instrumentation L2-L5 with redo posterolateral fusion L4-5. Patient has failed a reasonable attempt at conservative therapy.  I explained the condition and procedure to the patient and answered any questions.  Patient wishes to proceed with procedure as planned. Understands risks/ benefits and typical outcomes of procedure.   Tia Alert 12/17/2023 10:50 AM

## 2023-12-18 ENCOUNTER — Encounter (HOSPITAL_COMMUNITY): Payer: Self-pay | Admitting: Neurological Surgery

## 2023-12-18 LAB — GLUCOSE, CAPILLARY: Glucose-Capillary: 151 mg/dL — ABNORMAL HIGH (ref 70–99)

## 2023-12-18 MED ORDER — TIZANIDINE HCL 2 MG PO TABS: 2.0000 mg | ORAL_TABLET | Freq: Three times a day (TID) | ORAL | 0 refills | Status: AC | PRN

## 2023-12-18 MED ORDER — OXYCODONE-ACETAMINOPHEN 5-325 MG PO TABS: 1.0000 | ORAL_TABLET | Freq: Four times a day (QID) | ORAL | 0 refills | Status: AC | PRN

## 2023-12-18 NOTE — Discharge Summary (Signed)
 Physician Discharge Summary  Patient ID: Natasha Warren MRN: [id number] DOB/AGE: Jul 18, 1946 78 y.o.  Admit date: 12/17/2023 Discharge date: 12/18/2023  Admission Diagnoses: pseudoarthrosis lumbar    Discharge Diagnoses: same   Discharged Condition: good  Hospital Course: The patient was admitted on 12/17/2023 and taken to the operating room where the patient underwent re-do fusion L4-5. The patient tolerated the procedure well and was taken to the recovery room and then to the floor in stable condition. The hospital course was routine. There were no complications. The wound remained clean dry and intact. Pt had appropriate back soreness. No complaints of leg pain or new N/T/W. The patient remained afebrile with stable vital signs, and tolerated a regular diet. The patient continued to increase activities, and pain was well controlled with oral pain medications.   Consults: None  Significant Diagnostic Studies:  Results for orders placed or performed during the hospital encounter of 12/17/23  Glucose, capillary   Collection Time: 12/17/23  8:43 AM  Result Value Ref Range   Glucose-Capillary 116 (H) 70 - 99 mg/dL  Glucose, capillary   Collection Time: 12/17/23 10:46 AM  Result Value Ref Range   Glucose-Capillary 107 (H) 70 - 99 mg/dL  Glucose, capillary   Collection Time: 12/17/23  1:37 PM  Result Value Ref Range   Glucose-Capillary 117 (H) 70 - 99 mg/dL  Glucose, capillary   Collection Time: 12/17/23  5:46 PM  Result Value Ref Range   Glucose-Capillary 213 (H) 70 - 99 mg/dL  Glucose, capillary   Collection Time: 12/17/23  8:59 PM  Result Value Ref Range   Glucose-Capillary 218 (H) 70 - 99 mg/dL   Comment 1 Notify RN    Comment 2 Document in Chart   Glucose, capillary   Collection Time: 12/18/23  6:42 AM  Result Value Ref Range   Glucose-Capillary 151 (H) 70 - 99 mg/dL   Comment 1 Notify RN    Comment 2 Document in Chart     No results found.  Antibiotics:   Anti-infectives (From admission, onward)    Start     Dose/Rate Route Frequency Ordered Stop   12/17/23 2000  ceFAZolin (ANCEF) IVPB 2g/100 mL premix        2 g 200 mL/hr over 30 Minutes Intravenous Every 8 hours 12/17/23 1549 12/18/23 0411   12/17/23 0834  ceFAZolin (ANCEF) 2-4 GM/100ML-% IVPB       Note to Pharmacy: Kandice Hams D: cabinet override      12/17/23 0834 12/17/23 2044       Discharge Exam: Blood pressure (!) 152/71, pulse 65, temperature 98 F (36.7 C), temperature source Oral, resp. rate 20, height 5\' 2"  (1.575 m), weight 95.3 kg, SpO2 99%. Neurologic: Grossly normal Dressing dry  Discharge Medications:   Allergies as of 12/18/2023       Reactions   Venlafaxine Anxiety        Medication List     STOP taking these medications    methocarbamol 500 MG tablet Commonly known as: ROBAXIN       TAKE these medications    allopurinol 300 MG tablet Commonly known as: ZYLOPRIM Take 300 mg by mouth daily.   aspirin EC 81 MG tablet Take 81 mg by mouth daily. Swallow whole.   atorvastatin 20 MG tablet Commonly known as: LIPITOR Take 20 mg by mouth daily.   gabapentin 300 MG capsule Commonly known as: NEURONTIN Take 300 mg by mouth at bedtime.   levothyroxine 50 MCG tablet Commonly  known as: SYNTHROID Take 50 mcg by mouth daily before breakfast.   losartan 100 MG tablet Commonly known as: COZAAR Take 100 mg by mouth daily.   metFORMIN 500 MG tablet Commonly known as: GLUCOPHAGE Take 500 mg by mouth daily.   metoprolol succinate 50 MG 24 hr tablet Commonly known as: TOPROL-XL Take 1 & 1/2 tablets daily.   oxyCODONE-acetaminophen 5-325 MG tablet Commonly known as: PERCOCET/ROXICET Take 1 tablet by mouth every 6 (six) hours as needed for moderate pain (pain score 4-6). What changed: when to take this   polyethylene glycol 17 g packet Commonly known as: MIRALAX / GLYCOLAX Take 17 g by mouth daily.   Potassium Citrate 15 MEQ (1620 MG)  Tbcr Take 1 tablet by mouth 2 (two) times daily.   tiZANidine 2 MG tablet Commonly known as: ZANAFLEX Take 1 tablet (2 mg total) by mouth every 8 (eight) hours as needed for muscle spasms.   trolamine salicylate 10 % cream Commonly known as: ASPERCREME Apply 1 Application topically as needed for muscle pain.   Vitamin D3 125 MCG (5000 UT) Caps Take 5,000 Units by mouth daily.   zinc gluconate 50 MG tablet Take 50 mg by mouth daily.               Durable Medical Equipment  (From admission, onward)           Start     Ordered   12/17/23 1550  DME Walker rolling  Once       Question:  Patient needs a walker to treat with the following condition  Answer:  S/P lumbar fusion   12/17/23 1549   12/17/23 1550  DME 3 n 1  Once        12/17/23 1549            Disposition: home   Final Dx: extraction of loose instrumentation and re-do fusion  Discharge Instructions      Remove dressing in 72 hours   Complete by: As directed    Call MD for:  difficulty breathing, headache or visual disturbances   Complete by: As directed    Call MD for:  persistant nausea and vomiting   Complete by: As directed    Call MD for:  redness, tenderness, or signs of infection (pain, swelling, redness, odor or green/yellow discharge around incision site)   Complete by: As directed    Call MD for:  severe uncontrolled pain   Complete by: As directed    Call MD for:  temperature >100.4   Complete by: As directed    Diet - low sodium heart healthy   Complete by: As directed    Discharge instructions   Complete by: As directed    May shower, no driving, no heavy lifting   Increase activity slowly   Complete by: As directed           Signed: Tia Alert 12/18/2023, 7:45 AM

## 2023-12-18 NOTE — Plan of Care (Signed)
 Pt doing well. Pt and daughter given D/C instructions with verbal understanding. Rx's were sent to the pharmacy by MD. Pt's incision is clean and dry with no sign of infection. Pt's IV was removed prior to D/C. Pt D/C'd home via wheelchair per MD order. Pt is stable @ D/C and has no other needs at this time. Rema Fendt, RN

## 2023-12-18 NOTE — Evaluation (Signed)
 Physical Therapy Evaluation  Patient Details Name: Natasha Warren MRN: [id number] DOB: 03-09-1946 Today's Date: 12/18/2023  History of Present Illness  Natasha Warren is a 78 y.o. female s/p surgical procedure on 12/17/2023 lumbar fusion L4-5. PMHx includes arthritis, bulging lumbar disc, gout, HLD, HTN, diabetes, hypothyroidism, SVT, IBS, Nocturia, urolithiasis  Clinical Impression  Pt admitted with above diagnosis. At the time of PT eval, pt was able to demonstrate transfers and ambulation with gross supervision for safety and RW for support. Pt was educated on precautions, positioning recommendations, appropriate activity progression, and car transfer. Pt currently with functional limitations due to the deficits listed below (see PT Problem List). Pt will benefit from skilled PT to increase their independence and safety with mobility to allow discharge to the venue listed below.          If plan is discharge home, recommend the following: A little help with walking and/or transfers;A little help with bathing/dressing/bathroom;Assistance with cooking/housework;Help with stairs or ramp for entrance;Assist for transportation   Can travel by private vehicle        Equipment Recommendations None recommended by PT  Recommendations for Other Services       Functional Status Assessment Patient has had a recent decline in their functional status and demonstrates the ability to make significant improvements in function in a reasonable and predictable amount of time.     Precautions / Restrictions Precautions Precautions: Back;Fall Precaution Booklet Issued: Yes (comment) Recall of Precautions/Restrictions: Intact Precaution/Restrictions Comments: Verbally reviewed handout and pt was cued for precautions during functional mobility. Restrictions Weight Bearing Restrictions Per Provider Order: No      Mobility  Bed Mobility               General bed mobility comments: Pt was  received sitting up EOB    Transfers Overall transfer level: Needs assistance Equipment used: Rolling walker (2 wheels) Transfers: Sit to/from Stand Sit to Stand: Supervision           General transfer comment: VC's for hand placement on seated surface for safety.    Ambulation/Gait Ambulation/Gait assistance: Supervision Gait Distance (Feet): 300 Feet Assistive device: Rolling walker (2 wheels) Gait Pattern/deviations: Step-through pattern, Decreased stride length, Trunk flexed Gait velocity: Decreased Gait velocity interpretation: 1.31 - 2.62 ft/sec, indicative of limited community ambulator   General Gait Details: VC's for improved posture, closer walker proximity and forward gaze. No overt LOB noted.  Stairs            Wheelchair Mobility     Tilt Bed    Modified Rankin (Stroke Patients Only)       Balance Overall balance assessment: Mild deficits observed, not formally tested                                           Pertinent Vitals/Pain Pain Assessment Pain Assessment: Faces Faces Pain Scale: Hurts a little bit Pain Location: Incision site Pain Descriptors / Indicators: Operative site guarding, Sore Pain Intervention(s): Limited activity within patient's tolerance, Monitored during session, Repositioned    Home Living Family/patient expects to be discharged to:: Private residence Living Arrangements: Alone Available Help at Discharge: Family;Available PRN/intermittently Type of Home: House Home Access: Stairs to enter Entrance Stairs-Rails: None Entrance Stairs-Number of Steps: 1   Home Layout: One level Home Equipment: Agricultural consultant (2 wheels);Cane - single point;Rollator (4 wheels);BSC/3in1;Shower seat - built  in;Grab bars - tub/shower;Hand held shower head;Adaptive equipment;Other (comment) (bed rail) Additional Comments: Pt reports 2 daughters and granddaughter is able to assist as needed    Prior Function Prior Level  of Function : Independent/Modified Independent;Driving             Mobility Comments: Pt MOD I for functional mobility using rollator ADLs Comments: Pt completed IADLs and ADLs with MOD I using AE     Extremity/Trunk Assessment   Upper Extremity Assessment Upper Extremity Assessment: Defer to OT evaluation    Lower Extremity Assessment Lower Extremity Assessment: Generalized weakness (Mild; consistent with pre-op diagnosis)    Cervical / Trunk Assessment Cervical / Trunk Assessment: Back Surgery  Communication   Communication Communication: No apparent difficulties    Cognition Arousal: Alert Behavior During Therapy: WFL for tasks assessed/performed   PT - Cognitive impairments: No apparent impairments                         Following commands: Intact       Cueing Cueing Techniques: Verbal cues     General Comments General comments (skin integrity, edema, etc.): VSS on RA    Exercises     Assessment/Plan    PT Assessment Patient needs continued PT services  PT Problem List Decreased strength;Decreased activity tolerance;Decreased balance;Decreased mobility;Decreased knowledge of use of DME;Decreased safety awareness;Decreased knowledge of precautions;Pain       PT Treatment Interventions DME instruction;Gait training;Stair training;Functional mobility training;Therapeutic activities;Therapeutic exercise;Balance training;Patient/family education    PT Goals (Current goals can be found in the Care Plan section)  Acute Rehab PT Goals Patient Stated Goal: Home today PT Goal Formulation: With patient Time For Goal Achievement: 12/25/23 Potential to Achieve Goals: Good    Frequency Min 5X/week     Co-evaluation               AM-PAC PT "6 Clicks" Mobility  Outcome Measure Help needed turning from your back to your side while in a flat bed without using bedrails?: A Little Help needed moving from lying on your back to sitting on the side  of a flat bed without using bedrails?: A Little Help needed moving to and from a bed to a chair (including a wheelchair)?: A Little Help needed standing up from a chair using your arms (e.g., wheelchair or bedside chair)?: A Little Help needed to walk in hospital room?: A Little Help needed climbing 3-5 steps with a railing? : A Little 6 Click Score: 18    End of Session Equipment Utilized During Treatment: Gait belt Activity Tolerance: Patient tolerated treatment well Patient left: in bed;with call bell/phone within reach;with family/visitor present Nurse Communication: Mobility status PT Visit Diagnosis: Unsteadiness on feet (R26.81);Pain Pain - part of body:  (back)    Time: 0955-1010 PT Time Calculation (min) (ACUTE ONLY): 15 min   Charges:   PT Evaluation $PT Eval Low Complexity: 1 Low   PT General Charges $$ ACUTE PT VISIT: 1 Visit         Conni Slipper, PT, DPT Acute Rehabilitation Services Secure Chat Preferred Office: [phone]   Marylynn Pearson 12/18/2023, 11:50 AM

## 2023-12-18 NOTE — Evaluation (Signed)
 Occupational Therapy Evaluation Patient Details Name: Natasha Warren MRN: [id number] DOB: 1946-04-13 Today's Date: 12/18/2023   History of Present Illness   Natasha Warren is a 78 y.o. female s/p surgical procedure on 12/17/2023 lumbar fusion L4-5. PMHx includes arthritis, bulging lumbar disc, gout, HLD, HTN, diabetes, hypothyroidism, SVT, IBS, Nocturia, urolithiasis     Clinical Impressions PTA, pt lives at home alone, drives, and completes ADLs/IADLs with MOD I using AE and functional mobility with MOD I using rollator. Upon eval, pt performing UB ADL with supervision and LB ADL with supervision. Pt educated and demonstrating use of compensatory techniques for bed mobility, LB ADL, grooming, toileting, importance of mobilizing once an hour and shower transfers within precautions. All education provided and questions answered. Recommending discharge home with family support. OT to sign off. Please re-consult if change in status.        If plan is discharge home, recommend the following:   A little help with walking and/or transfers;A little help with bathing/dressing/bathroom;Assistance with cooking/housework;Assist for transportation;Help with stairs or ramp for entrance     Functional Status Assessment   Patient has had a recent decline in their functional status and demonstrates the ability to make significant improvements in function in a reasonable and predictable amount of time.     Equipment Recommendations   None recommended by OT     Recommendations for Other Services         Precautions/Restrictions   Precautions Precautions: Back;Fall Precaution Booklet Issued: Yes (comment) Recall of Precautions/Restrictions: Intact Precaution/Restrictions Comments: Pt provided with handout of back precautions. Precautions reviewed with pt and pt provided return demonstration to ensure understanding Restrictions Weight Bearing Restrictions Per Provider Order: No      Mobility Bed Mobility Overal bed mobility: Needs Assistance Bed Mobility: Sidelying to Sit, Rolling Rolling: Supervision, Used rails Sidelying to sit: Supervision, Used rails       General bed mobility comments: Pt educated on log roll technique. Pt reports using technique at home prior to procedure due to back pain. Pt also has bed rails at home to assist. Pt completed task with supervision    Transfers Overall transfer level: Needs assistance Equipment used: Rolling walker (2 wheels) Transfers: Sit to/from Stand Sit to Stand: Supervision           General transfer comment: supervision provided for safety. No LOB observed      Balance Overall balance assessment: Mild deficits observed, not formally tested                                         ADL either performed or assessed with clinical judgement   ADL Overall ADL's : Needs assistance/impaired Eating/Feeding: Independent;Sitting   Grooming: Wash/dry hands;Supervision/safety;Standing   Upper Body Bathing: Supervision/ safety;Sitting   Lower Body Bathing: Supervison/ safety;Sit to/from stand;Sitting/lateral leans   Upper Body Dressing : Supervision/safety;Sitting   Lower Body Dressing: Supervision/safety;With adaptive equipment;Sit to/from stand;Adhering to back precautions   Toilet Transfer: Supervision/safety;Ambulation;Regular Toilet;Rolling walker (2 wheels)   Toileting- Clothing Manipulation and Hygiene: Supervision/safety;Adhering to back precautions;Sitting/lateral lean;Sit to/from stand       Functional mobility during ADLs: Supervision/safety;Rolling walker (2 wheels) General ADL Comments: Supervision provided for safety. Pt has AE that she uses at baseline for LB ADLs. Pt aware of back precautions and able to adhere functionally     Vision Ability to See in Adequate Light: 0 Adequate  Patient Visual Report: No change from baseline Vision Assessment?: No apparent visual deficits      Perception Perception: Not tested       Praxis Praxis: Not tested       Pertinent Vitals/Pain Pain Assessment Pain Assessment: No/denies pain     Extremity/Trunk Assessment Upper Extremity Assessment Upper Extremity Assessment: Overall WFL for tasks assessed   Lower Extremity Assessment Lower Extremity Assessment: Defer to PT evaluation   Cervical / Trunk Assessment Cervical / Trunk Assessment: Back Surgery   Communication Communication Communication: No apparent difficulties   Cognition Arousal: Alert Behavior During Therapy: WFL for tasks assessed/performed Cognition: No apparent impairments                               Following commands: Intact       Cueing  General Comments   Cueing Techniques: Verbal cues  VSS on RA   Exercises     Shoulder Instructions      Home Living Family/patient expects to be discharged to:: Private residence Living Arrangements: Alone Available Help at Discharge: Family;Available PRN/intermittently Type of Home: House Home Access: Stairs to enter Entrance Stairs-Number of Steps: 1 Entrance Stairs-Rails: None Home Layout: One level     Bathroom Shower/Tub: Producer, television/film/video: Standard     Home Equipment: Agricultural consultant (2 wheels);Cane - single point;Rollator (4 wheels);BSC/3in1;Shower seat - built in;Grab bars - tub/shower;Hand held shower head;Adaptive equipment;Other (comment) (bed rail) Adaptive Equipment: Reacher;Sock aid Additional Comments: Pt reports 2 daughters and granddaughter is able to assist as needed      Prior Functioning/Environment Prior Level of Function : Independent/Modified Independent;Driving             Mobility Comments: Pt MOD I for functional mobility using rollator ADLs Comments: Pt completed IADLs and ADLs with MOD I using AE    OT Problem List: Decreased strength;Impaired balance (sitting and/or standing);Decreased knowledge of precautions;Decreased  activity tolerance   OT Treatment/Interventions:        OT Goals(Current goals can be found in the care plan section)   Acute Rehab OT Goals Patient Stated Goal: to go home OT Goal Formulation: With patient Time For Goal Achievement: 12/18/23 Potential to Achieve Goals: Good   OT Frequency:       Co-evaluation              AM-PAC OT "6 Clicks" Daily Activity     Outcome Measure Help from another person eating meals?: None Help from another person taking care of personal grooming?: A Little Help from another person toileting, which includes using toliet, bedpan, or urinal?: A Little Help from another person bathing (including washing, rinsing, drying)?: A Little Help from another person to put on and taking off regular upper body clothing?: A Little Help from another person to put on and taking off regular lower body clothing?: A Little 6 Click Score: 19   End of Session Equipment Utilized During Treatment: Rolling walker (2 wheels);Gait belt Nurse Communication: Mobility status  Activity Tolerance: Patient tolerated treatment well Patient left: in bed;with call bell/phone within reach  OT Visit Diagnosis: Unsteadiness on feet (R26.81);Other abnormalities of gait and mobility (R26.89);Muscle weakness (generalized) (M62.81)                Time: 1791-5056 OT Time Calculation (min): 22 min Charges:  OT General Charges $OT Visit: 1 Visit OT Evaluation $OT Eval Low Complexity: 1 Low  Aurore Redinger  Darliss Cheney Deneice Wack 12/18/2023, 10:12 AM

## 2024-02-12 ENCOUNTER — Encounter: Payer: Self-pay | Admitting: Plastic Surgery

## 2024-02-12 ENCOUNTER — Ambulatory Visit (INDEPENDENT_AMBULATORY_CARE_PROVIDER_SITE_OTHER): Admitting: Plastic Surgery

## 2024-02-12 VITALS — BP 104/71 | HR 70

## 2024-02-12 DIAGNOSIS — S31000A Unspecified open wound of lower back and pelvis without penetration into retroperitoneum, initial encounter: Secondary | ICD-10-CM | POA: Diagnosis not present

## 2024-02-12 DIAGNOSIS — Z981 Arthrodesis status: Secondary | ICD-10-CM | POA: Diagnosis not present

## 2024-02-12 NOTE — Progress Notes (Signed)
 Patient ID: Natasha Warren, female    DOB: August 30, 1946, 78 y.o.   MRN: [id number]   Chief Complaint  Patient presents with   Advice Only   Skin Problem    The patient is a 78 year old female here for evaluation of her back.  The patient underwent a lumbar reexploration with exploration of fusion that was L2-L5 and removal of instrumentation.  She had intertransverse arthrodesis from L4-5 bilaterally using morselized autograft.  This was done March 31.  The patient now has 3 vertical scars on her back.  1 is midline and then she has 1 to the right and 1 to the left.  This is most likely the reason for the slow healing.  The wound is at the superior aspect of the midline scar and is about 2 cm in size.  It is red and a little irritated but does not appear to be infected and does not appear to be draining today.  She has been using hydrogen peroxide and Neosporin.    Review of Systems  Constitutional:  Positive for activity change.  Eyes: Negative.   Respiratory: Negative.    Cardiovascular: Negative.   Gastrointestinal: Negative.   Genitourinary: Negative.   Musculoskeletal: Negative.   Skin:  Positive for wound.    Past Medical History:  Diagnosis Date   Arthritis    Bulging lumbar disc    Colon polyps    Complication of anesthesia    hard to wake after back surgery, block for rotator cuff 'didn't work'   Diabetes mellitus without complication (HCC)    Heart murmur    History of gout    History of gout    Hyperlipidemia    Hyperparathyroidism, primary (HCC)    Hypertension    Hypothyroidism    Irritable bowel syndrome    Nocturia    Renal disorder    kidney stones   Seasonal allergies    SVT (supraventricular tachycardia) (HCC)    Thyroid  disorder    Urolithiasis     Past Surgical History:  Procedure Laterality Date   ABDOMINAL HYSTERECTOMY  1986   APPENDECTOMY  1954   BACK SURGERY  09/2016   bladder tack  09/1984   CHOLECYSTECTOMY  01/1999   COLONOSCOPY   06/2006   This was her second one   COLONOSCOPY  07/12/2011   Procedure: COLONOSCOPY;  Surgeon: Ruby Corporal, MD;  Location: [facility];  Service: Endoscopy;  Laterality: N/A;  10:30 am   COLONOSCOPY N/A 04/03/2018   Procedure: COLONOSCOPY;  Surgeon: Ruby Corporal, MD;  Location: [facility];  Service: Endoscopy;  Laterality: N/A;  830   CYSTOSCOPY W/ URETERAL STENT PLACEMENT Left 10/03/2014   Procedure: CYSTOSCOPY WITH URETERAL STENT PLACEMENT;  Surgeon: Willye Harvey, MD;  Location: [facility];  Service: Urology;  Laterality: Left;   CYSTOSCOPY WITH URETEROSCOPY AND STENT PLACEMENT Left 03/16/2015   Procedure: CYSTOSCOPY WITH LEFT STENT PLACEMENT;  Surgeon: Trent Frizzle, MD;  Location: [facility];  Service: Urology;  Laterality: Left;   gout removal  2017   HOLMIUM LASER APPLICATION Left 01/07/2015   Procedure: HOLMIUM LASER APPLICATION;  Surgeon: Trent Frizzle, MD;  Location: [facility];  Service: Urology;  Laterality: Left;   KNEE ARTHROSCOPY  12/2010   Left knee   LAMINECTOMY WITH POSTERIOR LATERAL ARTHRODESIS LEVEL 1 N/A 12/17/2023   Procedure: explore previous lumbar fusion lumbar two-lumbar five,extraction of loosened  instrumentation redo fusion lumbar four-five;  Surgeon: Joaquin Mulberry, MD;  Location: [facility];  Service: Neurosurgery;  Laterality: N/A;   LITHOTRIPSY  06/1995   LUMBAR FUSION  06/2016   NEPHROLITHOTOMY Left 01/07/2015   Procedure: NEPHROLITHOTOMY PERCUTANEOUS;  Surgeon: Trent Frizzle, MD;  Location: [facility];  Service: Urology;  Laterality: Left;  With STENT   POLYPECTOMY  04/03/2018   Procedure: POLYPECTOMY;  Surgeon: Ruby Corporal, MD;  [facility]: [facility];  Service: Endoscopy;;  transverse,splenic flexure, sigmoid   REVERSE SHOULDER ARTHROPLASTY Right 05/10/2022   Procedure: REVERSE SHOULDER ARTHROPLASTY;  Surgeon: Micheline Ahr, MD;  Location: [facility];  Service: Orthopedics;  Laterality: Right;   Rotor Cuff  2011   Right Shoulder   STENT REMOVAL  Left 03/16/2015   Procedure: LEFT URETERAL STENT REMOVAL;  Surgeon: Trent Frizzle, MD;  Location: [facility];  Service: Urology;  Laterality: Left;   THYROID  EXPLORATION N/A 07/31/2013   Procedure: neck EXPLORATION;  Surgeon: Harlee Lichtenstein, MD;  Location: [facility];  Service: General;  Laterality: N/A;   THYROIDECTOMY N/A 07/31/2013   Procedure: PARATHYROIDECTOMY;  Surgeon: Harlee Lichtenstein, MD;  Location: [facility];  Service: General;  Laterality: N/A;   TUMOR REMOVAL  06/1997   Beign; on back x2 surgeries   TUMOR REMOVAL  2002   "FATTY TUMORS" REMOVED FROM BACK   URETEROSCOPY WITH HOLMIUM LASER LITHOTRIPSY Left 03/16/2015   Procedure: LEFT URETEROSCOPIC LASER LITHOTRIPSY WITH STONE EXTRACTION;  Surgeon: Trent Frizzle, MD;  Location: [facility];  Service: Urology;  Laterality: Left;   WISDOM TOOTH EXTRACTION        Current Outpatient Medications:    allopurinol  (ZYLOPRIM ) 300 MG tablet, Take 300 mg by mouth daily. , Disp: , Rfl: 5   aspirin  EC 81 MG tablet, Take 81 mg by mouth daily. Swallow whole., Disp: , Rfl:    atorvastatin  (LIPITOR) 20 MG tablet, Take 20 mg by mouth daily., Disp: , Rfl:    Cholecalciferol (VITAMIN D3) 125 MCG (5000 UT) CAPS, Take 5,000 Units by mouth daily., Disp: , Rfl:    gabapentin  (NEURONTIN ) 300 MG capsule, Take 300 mg by mouth at bedtime., Disp: , Rfl:    levothyroxine  (SYNTHROID ) 50 MCG tablet, Take 50 mcg by mouth daily before breakfast., Disp: , Rfl:    losartan  (COZAAR ) 100 MG tablet, Take 100 mg by mouth daily. , Disp: , Rfl: 1   metFORMIN  (GLUCOPHAGE ) 500 MG tablet, Take 500 mg by mouth daily., Disp: , Rfl:    metoprolol  succinate (TOPROL -XL) 50 MG 24 hr tablet, Take 1 & 1/2 tablets daily., Disp: 45 tablet, Rfl: 135   oxyCODONE -acetaminophen  (PERCOCET/ROXICET) 5-325 MG tablet, Take 1 tablet by mouth every 6 (six) hours as needed for moderate pain (pain score 4-6)., Disp: 30 tablet, Rfl: 0   polyethylene glycol (MIRALAX  / GLYCOLAX ) 17 g packet, Take 17 g by  mouth daily., Disp: , Rfl:    Potassium Citrate  15 MEQ (1620 MG) TBCR, Take 1 tablet by mouth 2 (two) times daily., Disp: 60 tablet, Rfl: 11   tiZANidine  (ZANAFLEX ) 2 MG tablet, Take 1 tablet (2 mg total) by mouth every 8 (eight) hours as needed for muscle spasms., Disp: 30 tablet, Rfl: 0   trolamine salicylate (ASPERCREME) 10 % cream, Apply 1 Application topically as needed for muscle pain., Disp: , Rfl:    zinc  gluconate 50 MG tablet, Take 50 mg by mouth daily., Disp: , Rfl:    Objective:   Vitals:   02/12/24 0926  BP: 104/71  Pulse: 70  SpO2: 97%  Physical Exam Constitutional:      Appearance: Normal appearance.  HENT:     Head: Normocephalic.  Cardiovascular:     Rate and Rhythm: Normal rate.     Pulses: Normal pulses.  Pulmonary:     Effort: Pulmonary effort is normal.  Musculoskeletal:        General: Tenderness present.  Skin:    General: Skin is warm.     Findings: Erythema and lesion present.  Neurological:     Mental Status: She is oriented to person, place, and time.  Psychiatric:        Mood and Affect: Mood normal.        Behavior: Behavior normal.        Thought Content: Thought content normal.        Judgment: Judgment normal.     Assessment & Plan:  Open wound of lumbar region  S/P lumbar fusion  I have asked the patient to stop using Neosporin and hydrogen peroxide.  I did like her to use Vashe and Vaseline for the next week and then had like to see her back.  If she can do it twice a day that would be great but she lives at home alone so she can only do it once a day that still better than her current treatment.  I would also like her to increase her protein and decrease her carbohydrates and her sugars.  I have personally reviewed her notes and spoken to the neurosurgeon.  Pictures were obtained of the patient and placed in the chart with the patient's or guardian's permission.   Lindaann Requena Jenson Beedle, DO

## 2024-02-22 ENCOUNTER — Telehealth: Payer: Self-pay | Admitting: *Deleted

## 2024-02-22 ENCOUNTER — Encounter: Payer: Self-pay | Admitting: Student

## 2024-02-22 ENCOUNTER — Ambulatory Visit: Admitting: Student

## 2024-02-22 VITALS — BP 142/88 | HR 72 | Ht 62.0 in | Wt 223.6 lb

## 2024-02-22 DIAGNOSIS — S31000A Unspecified open wound of lower back and pelvis without penetration into retroperitoneum, initial encounter: Secondary | ICD-10-CM

## 2024-02-22 NOTE — Progress Notes (Signed)
   Referring Provider Lauran Pollard, MD [address] [place],  [place] [address]   CC: No chief complaint on file.     Natasha Warren is an 78 y.o. female.  HPI: Patient is a 78 year old female with history of a wound to her lower back.  She presents to the clinic today for further follow-up of her wound.  Patient was last seen in the clinic on 02/12/2024.  At this visit, it was noted that the patient underwent a lumbar reexploration with exploration of the fusion that was L2-L5 and removal of instrumentation.  Patient had 3 vertical scars on her back.  One is midline and then she had 1 to the right and 1 to the left.  The wound was noted to be at the superior aspect of the midline scar and was about 2 cm in size.  It was red and a little irritated, but did not appear to be infected and was not draining at that time.  It was recommended that patient start using Vashe and Vaseline for the next week and then return to our clinic.  Today, patient presents with her granddaughter at bedside.  She reports she is doing well.  She denies any new problems, issues or concerns since her previous visit.  She reports that family has been helping her with dressing changes.  She denies any fevers or chills.  Review of Systems General: Denies any fevers or chills  Physical Exam    02/12/2024    9:26 AM 12/18/2023    7:24 AM 12/18/2023    5:27 AM  Vitals with BMI  Systolic 104 152 604  Diastolic 71 71 86  Pulse 70 65 62    General:  No acute distress,  Alert and oriented, Non-Toxic, Normal speech and affect Chaperone present on exam.  On exam, patient is sitting upright in no acute distress.  To the lower back, patient has an approximately 1.5 cm x 0.5 cm wound.  There is exudate/slough throughout the majority of the wound.  There does appear to be a sliver of granulation tissue underneath the exudate.  There is no signs of infection on exam.  Assessment/Plan  Open wound of lumbar region    Recommended that patient continue to clean the wound with Vashe daily.  Recommended that she apply Xeroform once daily to the wound followed by a Mepilex border dressing.  I showed the granddaughter how with dressing changes are performed.  She expressed understanding.  We will try to order the supplies for her through [facility] .  I discussed with her and her granddaughter that if they are unable to get the Xeroform or the Mepilex border dressings through [facility] , they may go back to Vaseline 1-2 times daily and a dressing over the top such as silicone border dressing or gauze.  They expressed understanding.  I discussed with the patient and her granddaughter that the exudate will come off with dressing changes.  They expressed understanding.  We will plan to see the patient back in about 2 to 3 weeks.  I instructed them to call if they have any questions or concerns in the meantime.  Pictures were obtained of the patient and placed in the chart with the patient's or guardian's permission.   Caesar Caster Konni Kesinger 02/22/2024, 8:21 AM

## 2024-02-22 NOTE — Telephone Encounter (Signed)
 Faxed order,demographics,insurance information,and recent office notes to [facility] for supplies for the patient.    (Supplies):Sterile Gauze:(4x4)-Daily                  Xeroform:(3x3)-Daily                  Mepilex Border:(3x3)-Daily  Confirmation received and copy scanned into the chart.//AB/CMA

## 2024-02-25 ENCOUNTER — Telehealth: Payer: Self-pay | Admitting: *Deleted

## 2024-02-25 NOTE — Telephone Encounter (Signed)
 Received on (02/25/2024) via of fax Order Status Notification from [facility].   Stating:[facility]  will be contacting the patient with pricing options.  Their insurance plan does not cover the supplies ordered under their benefits.  Thank you for your patience!//AB/CMA

## 2024-03-14 ENCOUNTER — Encounter: Payer: Self-pay | Admitting: Student

## 2024-03-14 ENCOUNTER — Ambulatory Visit (INDEPENDENT_AMBULATORY_CARE_PROVIDER_SITE_OTHER): Admitting: Student

## 2024-03-14 VITALS — BP 158/85 | HR 81 | Ht 62.0 in | Wt 220.0 lb

## 2024-03-14 DIAGNOSIS — S31000A Unspecified open wound of lower back and pelvis without penetration into retroperitoneum, initial encounter: Secondary | ICD-10-CM

## 2024-03-14 DIAGNOSIS — S31000D Unspecified open wound of lower back and pelvis without penetration into retroperitoneum, subsequent encounter: Secondary | ICD-10-CM | POA: Diagnosis not present

## 2024-03-14 NOTE — Progress Notes (Signed)
   Referring Provider Trudy Vaughn FALCON, MD [address] [place],  [place] [address]   CC:  Chief Complaint  Patient presents with   Follow-up      Natasha Warren is an 78 y.o. female.  HPI: Patient is a 78 year old female with history of a wound to her lower back. She presents to the clinic today for further follow-up of her wound.   Patient was last seen in the clinic on 02/22/2024.  At this visit, patient was doing well.  On exam, to the lower back there was an approximately 1.5 x 0.5 cm wound.  There was exudate/slough throughout the majority of the wound.  There did appear to be a sliver of granulation tissue underneath the exudate.  There were no signs of infection on exam.  Today, patient presents with her daughter at bedside.  She reports that she has been doing well.  She states that she was unable to get the supplies from [facility] , but has been keeping them wound cleaned with Vashe and has been applying Vaseline to her wound.  She denies any other issues or concerns with the wound.  She denies any fevers or chills.  Review of Systems General: Denies any fevers or chills  Physical Exam    03/14/2024    9:41 AM 02/22/2024    9:41 AM 02/12/2024    9:26 AM  Vitals with BMI  Height 5' 2 5' 2   Weight 220 lbs 223 lbs 10 oz   BMI 40.23 40.89   Systolic 158 142 895  Diastolic 85 88 71  Pulse 81 72 70    General:  No acute distress,  Alert and oriented, Non-Toxic, Normal speech and affect Chaperone present on exam.  On exam, patient is sitting upright in no acute distress.  Wound to the back is approximately 1 cm in size by 0.5 cm.  There appears to be some dried exudate/scabbing noted through the majority of the wound.  There does appear to be a little bit of healthy appearing tissue to the lateral aspect of the wound.  There is no surrounding erythema or drainage.  No signs of infection.  Assessment/Plan  Open wound of lumbar region   Discussed with patient that it appears her  wound has been improving.  Discussed with her that it is slow to heal.  Recommended that she make sure she is getting plenty of protein in.  Recommended she continue cleaning the wound with Vashe and then apply Vaseline to the wound 1-2 times daily.  Discussed with her she may cover as desired for comfort.  Patient expressed understanding.  We will plan to see the patient back in about 1 month.  I instructed her to call if she has any questions or concerns about anything.  Pictures were obtained of the patient and placed in the chart with the patient's or guardian's permission.    Natasha Warren 03/14/2024, 11:03 AM

## 2024-04-14 ENCOUNTER — Ambulatory Visit (INDEPENDENT_AMBULATORY_CARE_PROVIDER_SITE_OTHER): Admitting: Student

## 2024-04-14 ENCOUNTER — Encounter: Payer: Self-pay | Admitting: Student

## 2024-04-14 VITALS — BP 150/84 | HR 69 | Ht 62.0 in | Wt 220.0 lb

## 2024-04-14 DIAGNOSIS — S31000D Unspecified open wound of lower back and pelvis without penetration into retroperitoneum, subsequent encounter: Secondary | ICD-10-CM

## 2024-04-14 DIAGNOSIS — S31000A Unspecified open wound of lower back and pelvis without penetration into retroperitoneum, initial encounter: Secondary | ICD-10-CM

## 2024-04-14 NOTE — Progress Notes (Signed)
   Referring Provider Trudy Vaughn FALCON, MD [address] [place],  [place] [address]   CC:  Chief Complaint  Patient presents with   Follow-up      RUTHANNA MACCHIA is an 78 y.o. female.  HPI: Patient is a 78 year old female with history of a wound to her lower back. She presents to the clinic today for further follow-up of her wound.   Patient was last seen in the clinic on 03/14/2024.  At this visit, patient was doing well.  On exam, the wound to the back was approximately 1 cm x 0.5 cm.  Recommended that she apply Vaseline to the wound 1-2 times daily.  Today, patient presents with her daughter at bedside.  She denies any issues or concerns with her wound.  She states that it has been healing up nicely.  She denies any fevers or chills.  Review of Systems General: Denies any fevers or chills.  Physical Exam    04/14/2024   11:05 AM 03/14/2024    9:41 AM 02/22/2024    9:41 AM  Vitals with BMI  Height 5' 2 5' 2 5' 2  Weight 220 lbs 220 lbs 223 lbs 10 oz  BMI 40.23 40.23 40.89  Systolic 150 158 857  Diastolic 84 85 88  Pulse 69 81 72    General:  No acute distress,  Alert and oriented, Non-Toxic, Normal speech and affect Chaperone present on exam.  On exam, patient is sitting upright in no acute distress.Wound to the back appears to be almost completely healed.  There is a very small superficial scab noted to the wound.  There is no surrounding erythema or drainage.  No signs of infection.  Assessment/Plan  Open wound of lumbar region   Discussed with patient that her wound has almost completely healed.  Recommended that patient continue to apply Vaseline to her wound once a day for the next few weeks.  I discussed with her that after that, she may start scar creams if she would like.  Patient expressed understanding.  Patient to follow-up as needed.  I instructed her to call if she has any questions or concerns about anything.  Pictures were obtained of the patient and  placed in the chart with the patient's or guardian's permission.   Estefana FORBES Peck 04/14/2024, 11:17 AM

## 2024-05-21 ENCOUNTER — Ambulatory Visit (HOSPITAL_COMMUNITY)
Admission: RE | Admit: 2024-05-21 | Discharge: 2024-05-21 | Disposition: A | Discharge status: Home / Self Care | Source: Ambulatory Visit | Attending: Urology | Admitting: Urology

## 2024-05-21 DIAGNOSIS — N200 Calculus of kidney: Secondary | ICD-10-CM | POA: Insufficient documentation

## 2024-05-26 NOTE — Progress Notes (Signed)
 Impression/Assessment:  Bilateral nephrolithiasis, stable in volume and location.  Still on potassium citrate .  She will be starting calcium  supplementation soon.  Plan:  I discussed stone prevention diet with her-I think it is okay to take calcium  but limit sodium in diet  Continue potassium citrate   I will see her back in 6 months with KUB  History of Present Illness: Natasha Warren followed here for urolithiasis.  She has had several stone procedures. Additionally, she has undergone parathyroid  adenectomy for hyperparathyroidism.   She is on potassium citrate  3 times a day.  She says she usually takes it a couple of times a day.  She has had no stone issues over the past year or 2.    3.4.2025: Here for routine check.  Still on potassium citrate  a couple times a day.  No real stone symptoms until 4 days ago when she had left sided abdominal pain for about 4 hours.  It has subsided.  She worries that it was a kidney stone.  She tells me that her PCP wants her on calcium  supplement.  Past Medical History:  Diagnosis Date   Arthritis    Bulging lumbar disc    Colon polyps    Complication of anesthesia    hard to wake after back surgery, block for rotator cuff 'didn't work'   Diabetes mellitus without complication (HCC)    Heart murmur    History of gout    History of gout    Hyperlipidemia    Hyperparathyroidism, primary (HCC)    Hypertension    Hypothyroidism    Irritable bowel syndrome    Nocturia    Renal disorder    kidney stones   Seasonal allergies    SVT (supraventricular tachycardia) (HCC)    Thyroid  disorder    Urolithiasis     Past Surgical History:  Procedure Laterality Date   ABDOMINAL HYSTERECTOMY  1986   APPENDECTOMY  1954   BACK SURGERY  09/2016   bladder tack  09/1984   CHOLECYSTECTOMY  01/1999   COLONOSCOPY  06/2006   This was her second one   COLONOSCOPY  07/12/2011   Procedure: COLONOSCOPY;  Surgeon: Claudis RAYMOND Rivet, MD;  [facility];   Service: Endoscopy;  Laterality: N/A;  10:30 am   COLONOSCOPY N/A 04/03/2018   Procedure: COLONOSCOPY;  Surgeon: Rivet Claudis RAYMOND, MD;  [facility];  Service: Endoscopy;  Laterality: N/A;  830   CYSTOSCOPY W/ URETERAL STENT PLACEMENT Left 10/03/2014   Procedure: CYSTOSCOPY WITH URETERAL STENT PLACEMENT;  Surgeon: Norleen JINNY Seltzer, MD;  [facility]: [facility];  Service: Urology;  Laterality: Left;   CYSTOSCOPY WITH URETEROSCOPY AND STENT PLACEMENT Left 03/16/2015   Procedure: CYSTOSCOPY WITH LEFT STENT PLACEMENT;  Surgeon: Garnette Shack, MD;  Location: [facility];  Service: Urology;  Laterality: Left;   gout removal  2017   HOLMIUM LASER APPLICATION Left 01/07/2015   Procedure: HOLMIUM LASER APPLICATION;  Surgeon: Garnette Shack, MD;  [facility]: [facility];  Service: Urology;  Laterality: Left;   KNEE ARTHROSCOPY  12/2010   Left knee   LAMINECTOMY WITH POSTERIOR LATERAL ARTHRODESIS LEVEL 1 N/A 12/17/2023   Procedure: explore previous lumbar fusion lumbar two-lumbar five,extraction of loosened  instrumentation redo fusion lumbar four-five;  Surgeon: Joshua Alm Hamilton, MD;  Location: [facility];  Service: Neurosurgery;  Laterality: N/A;   LITHOTRIPSY  06/1995   LUMBAR FUSION  06/2016   NEPHROLITHOTOMY Left 01/07/2015   Procedure: NEPHROLITHOTOMY PERCUTANEOUS;  Surgeon: Garnette Shack, MD;  Location:  [facility];  Service: Urology;  Laterality: Left;  With STENT   POLYPECTOMY  04/03/2018   Procedure: POLYPECTOMY;  Surgeon: Golda Claudis PENNER, MD;  [facility];  Service: Endoscopy;;  transverse,splenic flexure, sigmoid   REVERSE SHOULDER ARTHROPLASTY Right 05/10/2022   Procedure: REVERSE SHOULDER ARTHROPLASTY;  Surgeon: Cristy Bonner DASEN, MD;  Location[facility];  Service: Orthopedics;  Laterality: Right;   Rotor Cuff  2011   Right Shoulder   STENT REMOVAL Left 03/16/2015   Procedure: LEFT URETERAL STENT REMOVAL;  Surgeon: Garnette Shack, MD;  [facility];  Service: Urology;  Laterality:  Left;   THYROID  EXPLORATION N/A 07/31/2013   Procedure: neck EXPLORATION;  Surgeon: Krystal JINNY Russell, MD;  [facility];  Service: General;  Laterality: N/A;   THYROIDECTOMY N/A 07/31/2013   Procedure: PARATHYROIDECTOMY;  Surgeon: Krystal JINNY Russell, MD;  [facility];  Service: General;  Laterality: N/A;   TUMOR REMOVAL  06/1997   Beign; on back x2 surgeries   TUMOR REMOVAL  2002   FATTY TUMORS REMOVED FROM BACK   URETEROSCOPY WITH HOLMIUM LASER LITHOTRIPSY Left 03/16/2015   Procedure: LEFT URETEROSCOPIC LASER LITHOTRIPSY WITH STONE EXTRACTION;  Surgeon: Garnette Shack, MD;  Location: [facility];  Service: Urology;  Laterality: Left;   WISDOM TOOTH EXTRACTION      Home Medications:  (Not in a hospital admission)   Allergies:  Allergies  Allergen Reactions   Venlafaxine Anxiety    Family History  Problem Relation Age of Onset   Hypertension Mother    Arthritis Mother    Hypertension Father    Healthy Brother    Healthy Daughter    Healthy Daughter    Colon cancer Neg Hx     Social History:  reports that she has never smoked. She has never used smokeless tobacco. She reports that she does not currently use alcohol. She reports that she does not use drugs.  ROS: A complete review of systems was performed.  All systems are negative except for pertinent findings as noted.  Physical Exam:  Vital signs in last 24 hours: @VSRANGES @ General:  Alert and oriented, No acute distress HEENT: Normocephalic, atraumatic Neck: No JVD or lymphadenopathy Cardiovascular: Regular rate  Lungs: Normal inspiratory/expiratory excursion Neurologic: Grossly intact  I have reviewed prior pt notes  I have reviewed urinalysis results  I have independently reviewed prior imaging--prior KUB's, CT scans  I have reviewed p KUB performed on the 25th of last month.  Stable left and right renal calculi, no significant growth.  2 of these are on the left, 2 on the right.

## 2024-05-27 ENCOUNTER — Ambulatory Visit (INDEPENDENT_AMBULATORY_CARE_PROVIDER_SITE_OTHER): Admitting: Urology

## 2024-05-27 VITALS — BP 127/79 | HR 80

## 2024-05-27 DIAGNOSIS — N200 Calculus of kidney: Secondary | ICD-10-CM

## 2024-05-27 LAB — URINALYSIS, ROUTINE W REFLEX MICROSCOPIC
Bilirubin, UA: NEGATIVE
Glucose, UA: NEGATIVE
Ketones, UA: NEGATIVE
Nitrite, UA: NEGATIVE
Protein,UA: NEGATIVE
RBC, UA: NEGATIVE
Specific Gravity, UA: 1.015 (ref 1.005–1.030)
Urobilinogen, Ur: 0.2 mg/dL (ref 0.2–1.0)
pH, UA: 7 (ref 5.0–7.5)

## 2024-05-27 LAB — MICROSCOPIC EXAMINATION: Bacteria, UA: NONE SEEN

## 2024-05-27 MED ORDER — POTASSIUM CITRATE ER 15 MEQ (1620 MG) PO TBCR: 1.0000 | EXTENDED_RELEASE_TABLET | Freq: Two times a day (BID) | ORAL | 3 refills | Status: AC

## 2024-10-21 ENCOUNTER — Inpatient Hospital Stay (HOSPITAL_COMMUNITY)
Admission: EM | Admit: 2024-10-21 | Discharge: 2024-10-23 | Disposition: A | Source: Home / Self Care | Attending: Cardiology | Admitting: Cardiology

## 2024-10-21 ENCOUNTER — Encounter (HOSPITAL_COMMUNITY): Admission: EM | Disposition: A | Payer: Self-pay | Source: Home / Self Care | Attending: Cardiology

## 2024-10-21 ENCOUNTER — Encounter (HOSPITAL_COMMUNITY): Payer: Self-pay | Admitting: Cardiovascular Disease

## 2024-10-21 DIAGNOSIS — I255 Ischemic cardiomyopathy: Secondary | ICD-10-CM | POA: Insufficient documentation

## 2024-10-21 DIAGNOSIS — I213 ST elevation (STEMI) myocardial infarction of unspecified site: Secondary | ICD-10-CM | POA: Diagnosis present

## 2024-10-21 DIAGNOSIS — I10 Essential (primary) hypertension: Secondary | ICD-10-CM | POA: Insufficient documentation

## 2024-10-21 DIAGNOSIS — I2121 ST elevation (STEMI) myocardial infarction involving left circumflex coronary artery: Principal | ICD-10-CM | POA: Diagnosis present

## 2024-10-21 DIAGNOSIS — E785 Hyperlipidemia, unspecified: Secondary | ICD-10-CM | POA: Insufficient documentation

## 2024-10-21 DIAGNOSIS — I350 Nonrheumatic aortic (valve) stenosis: Secondary | ICD-10-CM

## 2024-10-21 LAB — POCT I-STAT, CHEM 8
BUN: 28 mg/dL — ABNORMAL HIGH (ref 8–23)
Calcium, Ion: 1.25 mmol/L (ref 1.15–1.40)
Chloride: 103 mmol/L (ref 98–111)
Creatinine, Ser: 1.2 mg/dL — ABNORMAL HIGH (ref 0.44–1.00)
Glucose, Bld: 162 mg/dL — ABNORMAL HIGH (ref 70–99)
HCT: 43 % (ref 36.0–46.0)
Hemoglobin: 14.6 g/dL (ref 12.0–15.0)
Potassium: 4.3 mmol/L (ref 3.5–5.1)
Sodium: 139 mmol/L (ref 135–145)
TCO2: 27 mmol/L (ref 22–32)

## 2024-10-21 LAB — COMPREHENSIVE METABOLIC PANEL WITH GFR
ALT: 17 U/L (ref 0–44)
AST: 49 U/L — ABNORMAL HIGH (ref 15–41)
Albumin: 4 g/dL (ref 3.5–5.0)
Alkaline Phosphatase: 105 U/L (ref 38–126)
Anion gap: 13 (ref 5–15)
BUN: 27 mg/dL — ABNORMAL HIGH (ref 8–23)
CO2: 23 mmol/L (ref 22–32)
Calcium: 9.7 mg/dL (ref 8.9–10.3)
Chloride: 102 mmol/L (ref 98–111)
Creatinine, Ser: 1.11 mg/dL — ABNORMAL HIGH (ref 0.44–1.00)
GFR, Estimated: 51 mL/min — ABNORMAL LOW
Glucose, Bld: 159 mg/dL — ABNORMAL HIGH (ref 70–99)
Potassium: 4.6 mmol/L (ref 3.5–5.1)
Sodium: 138 mmol/L (ref 135–145)
Total Bilirubin: 0.5 mg/dL (ref 0.0–1.2)
Total Protein: 7 g/dL (ref 6.5–8.1)

## 2024-10-21 LAB — LIPID PANEL
Cholesterol: 156 mg/dL (ref 0–200)
HDL: 46 mg/dL
LDL Cholesterol: 58 mg/dL (ref 0–99)
Total CHOL/HDL Ratio: 3.4 ratio
Triglycerides: 259 mg/dL — ABNORMAL HIGH
VLDL: 52 mg/dL — ABNORMAL HIGH (ref 0–40)

## 2024-10-21 LAB — CBC
HCT: 44.8 % (ref 36.0–46.0)
HCT: 48.6 % — ABNORMAL HIGH (ref 36.0–46.0)
Hemoglobin: 14.1 g/dL (ref 12.0–15.0)
Hemoglobin: 15.4 g/dL — ABNORMAL HIGH (ref 12.0–15.0)
MCH: 29.3 pg (ref 26.0–34.0)
MCH: 29.1 pg (ref 26.0–34.0)
MCHC: 31.5 g/dL (ref 30.0–36.0)
MCHC: 31.7 g/dL (ref 30.0–36.0)
MCV: 92.9 fL (ref 80.0–100.0)
MCV: 91.7 fL (ref 80.0–100.0)
Platelets: 252 10*3/uL (ref 150–400)
Platelets: 233 10*3/uL (ref 150–400)
RBC: 4.82 MIL/uL (ref 3.87–5.11)
RBC: 5.3 MIL/uL — ABNORMAL HIGH (ref 3.87–5.11)
RDW: 14.8 % (ref 11.5–15.5)
RDW: 14.7 % (ref 11.5–15.5)
WBC: 8.1 10*3/uL (ref 4.0–10.5)
WBC: 9.5 10*3/uL (ref 4.0–10.5)
nRBC: 0 % (ref 0.0–0.2)
nRBC: 0 % (ref 0.0–0.2)

## 2024-10-21 LAB — APTT: aPTT: 28 s (ref 24–36)

## 2024-10-21 LAB — POCT ACTIVATED CLOTTING TIME
Activated Clotting Time: 127 s
Activated Clotting Time: 128 s
Activated Clotting Time: 143 s
Activated Clotting Time: 225 s

## 2024-10-21 LAB — HEMOGLOBIN A1C
Hgb A1c MFr Bld: 5.8 % — ABNORMAL HIGH (ref 4.8–5.6)
Mean Plasma Glucose: 119.76 mg/dL

## 2024-10-21 LAB — MRSA NEXT GEN BY PCR, NASAL: MRSA by PCR Next Gen: NOT DETECTED

## 2024-10-21 LAB — CREATININE, SERUM
Creatinine, Ser: 1.13 mg/dL — ABNORMAL HIGH (ref 0.44–1.00)
GFR, Estimated: 50 mL/min — ABNORMAL LOW

## 2024-10-21 LAB — CG4 I-STAT (LACTIC ACID): Lactic Acid, Venous: 1.7 mmol/L (ref 0.5–1.9)

## 2024-10-21 LAB — PROTIME-INR
INR: 1 (ref 0.8–1.2)
Prothrombin Time: 14 s (ref 11.4–15.2)

## 2024-10-21 MED ORDER — MIDAZOLAM HCL (PF) 2 MG/2ML IJ SOLN
INTRAMUSCULAR | Status: DC | PRN
  Administered 2024-10-21: 1 mg via INTRAVENOUS

## 2024-10-21 MED ORDER — VERAPAMIL HCL 2.5 MG/ML IV SOLN
INTRAVENOUS | Status: AC
  Filled 2024-10-21: qty 2

## 2024-10-21 MED ORDER — ATORVASTATIN CALCIUM 80 MG PO TABS
80.0000 mg | ORAL_TABLET | Freq: Every day | ORAL | Status: DC
  Administered 2024-10-22 – 2024-10-23 (×2): 80 mg via ORAL
  Filled 2024-10-21 (×2): qty 1

## 2024-10-21 MED ORDER — MIDAZOLAM HCL 2 MG/2ML IJ SOLN
INTRAMUSCULAR | Status: AC
  Filled 2024-10-21: qty 2

## 2024-10-21 MED ORDER — METOPROLOL SUCCINATE ER 50 MG PO TB24
50.0000 mg | ORAL_TABLET | Freq: Every day | ORAL | Status: DC
  Administered 2024-10-22 – 2024-10-23 (×2): 50 mg via ORAL
  Filled 2024-10-21 (×2): qty 1

## 2024-10-21 MED ORDER — VERAPAMIL HCL 2.5 MG/ML IV SOLN
INTRAVENOUS | Status: DC | PRN
  Administered 2024-10-21: 10 mL via INTRA_ARTERIAL

## 2024-10-21 MED ORDER — IOHEXOL 350 MG/ML SOLN
INTRAVENOUS | Status: DC | PRN
  Administered 2024-10-21: 90 mL

## 2024-10-21 MED ORDER — SODIUM CHLORIDE 0.9 % IV SOLN: 250.0000 mL | INTRAVENOUS | Status: AC | PRN

## 2024-10-21 MED ORDER — LEVOTHYROXINE SODIUM 50 MCG PO TABS
50.0000 ug | ORAL_TABLET | Freq: Every day | ORAL | Status: DC
  Administered 2024-10-22 – 2024-10-23 (×2): 50 ug via ORAL
  Filled 2024-10-21 (×2): qty 1

## 2024-10-21 MED ORDER — SODIUM CHLORIDE 0.9% FLUSH
3.0000 mL | Freq: Two times a day (BID) | INTRAVENOUS | Status: DC
  Administered 2024-10-21 – 2024-10-23 (×4): 3 mL via INTRAVENOUS

## 2024-10-21 MED ORDER — LABETALOL HCL 5 MG/ML IV SOLN
10.0000 mg | INTRAVENOUS | Status: AC | PRN
  Administered 2024-10-21: 10 mg via INTRAVENOUS
  Filled 2024-10-21 (×2): qty 4

## 2024-10-21 MED ORDER — HYDRALAZINE HCL 20 MG/ML IJ SOLN: 10.0000 mg | INTRAMUSCULAR | Status: AC | PRN

## 2024-10-21 MED ORDER — SODIUM CHLORIDE 0.9% FLUSH: 3.0000 mL | INTRAVENOUS | Status: DC | PRN

## 2024-10-21 MED ORDER — HEPARIN (PORCINE) IN NACL 1000-0.9 UT/500ML-% IV SOLN
INTRAVENOUS | Status: DC | PRN
  Administered 2024-10-21: 1000 mL

## 2024-10-21 MED ORDER — LIDOCAINE HCL (PF) 1 % IJ SOLN
INTRAMUSCULAR | Status: DC | PRN
  Administered 2024-10-21: 5 mL via INTRADERMAL

## 2024-10-21 MED ORDER — MORPHINE SULFATE (PF) 2 MG/ML IV SOLN
2.0000 mg | INTRAVENOUS | Status: DC | PRN
  Administered 2024-10-21: 4 mg via INTRAVENOUS
  Administered 2024-10-21: 2 mg via INTRAVENOUS
  Filled 2024-10-21: qty 2
  Filled 2024-10-21: qty 1

## 2024-10-21 MED ORDER — ASPIRIN 81 MG PO TBEC
81.0000 mg | DELAYED_RELEASE_TABLET | Freq: Every day | ORAL | Status: DC
  Administered 2024-10-22 – 2024-10-23 (×2): 81 mg via ORAL
  Filled 2024-10-21 (×2): qty 1

## 2024-10-21 MED ORDER — FENTANYL CITRATE (PF) 100 MCG/2ML IJ SOLN
INTRAMUSCULAR | Status: DC | PRN
  Administered 2024-10-21: 25 ug via INTRAVENOUS

## 2024-10-21 MED ORDER — TICAGRELOR 90 MG PO TABS
ORAL_TABLET | ORAL | Status: DC | PRN
  Administered 2024-10-21: 180 mg via ORAL

## 2024-10-21 MED ORDER — TICAGRELOR 90 MG PO TABS
ORAL_TABLET | ORAL | Status: AC
  Filled 2024-10-21: qty 2

## 2024-10-21 MED ORDER — FREE WATER
500.0000 mL | Freq: Once | Status: AC
  Administered 2024-10-21: 500 mL via ORAL

## 2024-10-21 MED ORDER — SODIUM CHLORIDE 0.9 % IV SOLN
INTRAVENOUS | Status: AC | PRN
  Administered 2024-10-21: 10 mL/h via INTRAVENOUS

## 2024-10-21 MED ORDER — GABAPENTIN 300 MG PO CAPS
300.0000 mg | ORAL_CAPSULE | Freq: Every day | ORAL | Status: DC
  Administered 2024-10-21 – 2024-10-22 (×2): 300 mg via ORAL
  Filled 2024-10-21 (×2): qty 1

## 2024-10-21 MED ORDER — ENOXAPARIN SODIUM 40 MG/0.4ML IJ SOSY
40.0000 mg | PREFILLED_SYRINGE | INTRAMUSCULAR | Status: DC
  Administered 2024-10-22 – 2024-10-23 (×2): 40 mg via SUBCUTANEOUS
  Filled 2024-10-21 (×2): qty 0.4

## 2024-10-21 MED ORDER — TICAGRELOR 90 MG PO TABS
90.0000 mg | ORAL_TABLET | Freq: Two times a day (BID) | ORAL | Status: DC
  Administered 2024-10-22 – 2024-10-23 (×3): 90 mg via ORAL
  Filled 2024-10-21 (×3): qty 1

## 2024-10-21 MED ORDER — LIDOCAINE HCL (PF) 1 % IJ SOLN
INTRAMUSCULAR | Status: AC
  Filled 2024-10-21: qty 30

## 2024-10-21 MED ORDER — ACETAMINOPHEN 325 MG PO TABS: 650.0000 mg | ORAL_TABLET | ORAL | Status: DC | PRN

## 2024-10-21 MED ORDER — HEPARIN SODIUM (PORCINE) 1000 UNIT/ML IJ SOLN
INTRAMUSCULAR | Status: AC
  Filled 2024-10-21: qty 20

## 2024-10-21 MED ORDER — FENTANYL CITRATE (PF) 100 MCG/2ML IJ SOLN
INTRAMUSCULAR | Status: AC
  Filled 2024-10-21: qty 2

## 2024-10-21 MED ORDER — ONDANSETRON HCL 4 MG/2ML IJ SOLN
4.0000 mg | Freq: Four times a day (QID) | INTRAMUSCULAR | Status: DC | PRN
  Administered 2024-10-21: 4 mg via INTRAVENOUS
  Filled 2024-10-21: qty 2

## 2024-10-21 MED ORDER — HEPARIN SODIUM (PORCINE) 1000 UNIT/ML IJ SOLN
INTRAMUSCULAR | Status: DC | PRN
  Administered 2024-10-21: 12000 [IU] via INTRAVENOUS

## 2024-10-21 MED ORDER — NITROGLYCERIN 0.4 MG SL SUBL: 0.4000 mg | SUBLINGUAL_TABLET | SUBLINGUAL | Status: DC | PRN

## 2024-10-22 ENCOUNTER — Telehealth (HOSPITAL_COMMUNITY): Payer: Self-pay | Admitting: Pharmacy Technician

## 2024-10-22 ENCOUNTER — Other Ambulatory Visit (HOSPITAL_COMMUNITY): Payer: Self-pay

## 2024-10-22 ENCOUNTER — Inpatient Hospital Stay (HOSPITAL_COMMUNITY)

## 2024-10-22 DIAGNOSIS — I2489 Other forms of acute ischemic heart disease: Secondary | ICD-10-CM

## 2024-10-22 LAB — BASIC METABOLIC PANEL WITH GFR
Anion gap: 11 (ref 5–15)
BUN: 25 mg/dL — ABNORMAL HIGH (ref 8–23)
CO2: 26 mmol/L (ref 22–32)
Calcium: 9.9 mg/dL (ref 8.9–10.3)
Chloride: 102 mmol/L (ref 98–111)
Creatinine, Ser: 1.37 mg/dL — ABNORMAL HIGH (ref 0.44–1.00)
GFR, Estimated: 39 mL/min — ABNORMAL LOW
Glucose, Bld: 113 mg/dL — ABNORMAL HIGH (ref 70–99)
Potassium: 4.5 mmol/L (ref 3.5–5.1)
Sodium: 139 mmol/L (ref 135–145)

## 2024-10-22 LAB — CBC
HCT: 44.7 % (ref 36.0–46.0)
Hemoglobin: 14.5 g/dL (ref 12.0–15.0)
MCH: 29.5 pg (ref 26.0–34.0)
MCHC: 32.4 g/dL (ref 30.0–36.0)
MCV: 90.9 fL (ref 80.0–100.0)
Platelets: 229 10*3/uL (ref 150–400)
RBC: 4.92 MIL/uL (ref 3.87–5.11)
RDW: 15 % (ref 11.5–15.5)
WBC: 8.1 10*3/uL (ref 4.0–10.5)
nRBC: 0 % (ref 0.0–0.2)

## 2024-10-22 LAB — ECHOCARDIOGRAM COMPLETE
AR max vel: 0.91 cm2
AV Area VTI: 0.85 cm2
AV Area mean vel: 0.85 cm2
AV Mean grad: 33.5 mmHg
AV Peak grad: 56 mmHg
Ao pk vel: 3.74 m/s
Area-P 1/2: 4.49 cm2
MV M vel: 5.77 m/s
MV Peak grad: 132.9 mmHg
P 1/2 time: 134 ms
S' Lateral: 2.7 cm
Single Plane A4C EF: 58.7 %

## 2024-10-22 LAB — GLUCOSE, CAPILLARY
Glucose-Capillary: 157 mg/dL — ABNORMAL HIGH (ref 70–99)
Glucose-Capillary: 105 mg/dL — ABNORMAL HIGH (ref 70–99)

## 2024-10-22 MED ORDER — ORAL CARE MOUTH RINSE: 15.0000 mL | OROMUCOSAL | Status: DC | PRN

## 2024-10-22 MED ORDER — CHLORHEXIDINE GLUCONATE CLOTH 2 % EX PADS
6.0000 | MEDICATED_PAD | Freq: Every day | CUTANEOUS | Status: DC
  Administered 2024-10-22 – 2024-10-23 (×2): 6 via TOPICAL

## 2024-10-22 MED ORDER — PERFLUTREN LIPID MICROSPHERE
1.0000 mL | INTRAVENOUS | Status: AC | PRN
  Administered 2024-10-22: 3 mL via INTRAVENOUS

## 2024-10-22 MED ORDER — INSULIN ASPART 100 UNIT/ML IJ SOLN
0.0000 [IU] | Freq: Three times a day (TID) | INTRAMUSCULAR | Status: DC
  Administered 2024-10-22: 2 [IU] via SUBCUTANEOUS
  Filled 2024-10-22: qty 2

## 2024-10-22 NOTE — TOC Initial Note (Signed)
 Transition of Care [facility]) - Initial/Assessment Note    Patient Details  Name: Natasha Warren MRN: [id number] Date of Birth: August 24, 1946  [facility] CM/SW Contact:    Arlana JINNY Nicholaus [place] Phone Number: [phone] 10/22/2024, 11:21 AM  Clinical Narrative:       HF CSW met with patient and daughter Amber at bedside. Patient stated that she lives alone. Prior to this hospitalization patient was driving. Patient stated that she has no current [facility] services. Patient stated that she uses a rollator, cane, and has a walker. Patient stated that she has a scale at home. Patient stated that she has a PCP. CSW explained that a hospital follow up appointment is typically scheduled closer towards dc. Patient is agreeable. Patient has family support to assist with transportation at dc.   HF CSW/Unit CM will continue to follow and monitor for dc readiness.             Expected Discharge Plan: Home/Self Care Barriers to Discharge: Continued Medical Work up   Patient Goals and CMS Choice Patient states their goals for this hospitalization and ongoing recovery are:: return home CMS Medicare.gov Compare Post Acute Care list provided to:: Patient Choice offered to / list presented to : Patient [place] ownership interest in [facility].provided to:: Patient    Expected Discharge Plan and Services       Living arrangements for the past 2 months: Single Family Home                                      Prior Living Arrangements/Services Living arrangements for the past 2 months: Single Family Home Lives with:: Self Patient language and need for interpreter reviewed:: Yes Do you feel safe going back to the place where you live?: Yes      Need for Family Participation in Patient Care: Yes (Comment) Care giver support system in place?: Yes (comment)   Criminal Activity/Legal Involvement Pertinent to Current Situation/Hospitalization: No - Comment as  needed  Activities of Daily Living      Permission Sought/Granted Permission sought to share information with : Case Manager, Family Supports, PCP Permission granted to share information with : Yes, Verbal Permission Granted  Share Information with NAME: Neal,Amber Daughter [phone]           Emotional Assessment Appearance:: Appears stated age Attitude/Demeanor/Rapport: Engaged Affect (typically observed): Appropriate Orientation: : Oriented to Self, Oriented to Place, Oriented to  Time, Oriented to Situation Alcohol / Substance Use: Not Applicable Psych Involvement: No (comment)  Admission diagnosis:  STEMI involving left circumflex coronary artery (HCC) [I21.21] ST elevation myocardial infarction involving left circumflex coronary artery (HCC) [I21.21] STEMI (ST elevation myocardial infarction) (HCC) [I21.3] Patient Active Problem List   Diagnosis Date Noted   STEMI involving left circumflex coronary artery (HCC) 10/21/2024   ST elevation myocardial infarction involving left circumflex coronary artery (HCC) 10/21/2024   STEMI (ST elevation myocardial infarction) (HCC) 10/21/2024   Open wound of lumbar region 02/12/2024   S/P lumbar fusion 12/17/2023   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 [place] [address] Phone: [phone] Fax: [phone]  [facility] [address] [place] [address] Phone: [phone] Fax:  [phone]  [id number] - [place], [place] - [address] [facility] [address] [place] [address] Phone: [phone] Fax: [phone]     Social Drivers of Health (SDOH) Social History: SDOH Screenings   Tobacco Use: Low Risk (04/14/2024)   SDOH Interventions:     Readmission Risk Interventions     No data to display

## 2024-10-22 NOTE — Progress Notes (Addendum)
 "    Advanced Heart Failure Rounding Note  Cardiologist: Alvan Carrier, MD   Chief Complaint: Acute inferior STEMI Patient Profile   Natasha Warren is a 79 y.o. female with history of aortic valve stenosis, SVT, multiple back surgeries, HTN, hypothyroidism, DM.   Significant events:   2/3: Inferior STEMI. Emergent cath with 100% OM2 s/p unsuccessful attempt at PTCA. No significant disease elsewhere. LVEF 45%. Inferolateral AK.  Subjective:    Notes very mild chest pressure, significantly improved from last night.  Echo pending.   Objective:      There is no height or weight on file to calculate BMI.   Vital Signs:   Temp:  [98 F (36.7 C)-98.1 F (36.7 C)] 98 F (36.7 C) (02/04 0332) Pulse Rate:  [0-94] 68 (02/04 0800) Resp:  [13-24] 19 (02/04 0800) BP: (102-191)/(66-112) 102/75 (02/04 0800) SpO2:  [96 %-100 %] 96 % (02/04 0800) Last BM Date : 10/22/24  Weight change: There were no vitals filed for this visit.  Intake/Output:   Intake/Output Summary (Last 24 hours) at 10/22/2024 0904 Last data filed at 10/22/2024 0600 Gross per 24 hour  Intake 620 ml  Output 400 ml  Net 220 ml     Physical Exam   General:  Well appearing elderly female Cor: JVP ~ 7-8 cm. Regular rate & rhythm. 2/6 AS murmur Lungs: clear Extremities: no edema   Telemetry   SR 60s  Labs   CBC Recent Labs    10/21/24 1920 10/21/24 1928 10/21/24 2120  WBC 8.1  --  9.5  HGB 14.1 14.6 15.4*  HCT 44.8 43.0 48.6*  MCV 92.9  --  91.7  PLT 252  --  233   Basic Metabolic Panel Recent Labs    97/96/73 1920 10/21/24 1928 10/21/24 2120  NA 138 139  --   K 4.6 4.3  --   CL 102 103  --   CO2 23  --   --   GLUCOSE 159* 162*  --   BUN 27* 28*  --   CREATININE 1.11* 1.20* 1.13*  CALCIUM  9.7  --   --    Liver Function Tests Recent Labs    10/21/24 1920  AST 49*  ALT 17  ALKPHOS 105  BILITOT 0.5  PROT 7.0  ALBUMIN 4.0   No results for input(s): LIPASE, AMYLASE in the  last 72 hours. Cardiac Enzymes No results for input(s): CKTOTAL, CKMB, CKMBINDEX, TROPONINI in the last 72 hours.  BNP: BNP (last 3 results) No results for input(s): BNP in the last 8760 hours.  ProBNP (last 3 results) No results for input(s): PROBNP in the last 8760 hours.   D-Dimer No results for input(s): DDIMER in the last 72 hours. Hemoglobin A1C Recent Labs    10/21/24 1920  HGBA1C 5.8*   Fasting Lipid Panel Recent Labs    10/21/24 1920  CHOL 156  HDL 46  LDLCALC 58  TRIG 259*  CHOLHDL 3.4   Medications:   Scheduled Medications:  aspirin  EC  81 mg Oral Daily   atorvastatin   80 mg Oral Daily   Chlorhexidine  Gluconate Cloth  6 each Topical Daily   enoxaparin  (LOVENOX ) injection  40 mg Subcutaneous Q24H   gabapentin   300 mg Oral QHS   levothyroxine   50 mcg Oral Q0600   metoprolol  succinate  50 mg Oral Daily   sodium chloride  flush  3 mL Intravenous Q12H   ticagrelor   90 mg Oral BID    Infusions:  sodium chloride       PRN Medications: sodium chloride , acetaminophen , morphine  injection, nitroGLYCERIN , ondansetron  (ZOFRAN ) IV, mouth rinse, sodium chloride  flush  Assessment/Plan   Acute inferior STEMI/CAD -Emergent cath 10/21/24 with 100% OM2, unsuccessful PTCA -Has orders for PRN nitroglycerin  and morphine  for angina -DAPT with ASA + ticagrelor  X 1 year -LDL 58, continue Atorvastatin  80  ICM -EF 45% in lab with inferolateral AK -Complete echo pending -Continue toprol  xl 50 mg daily which she is on for SVT -GDMT pending echo  Hx SVT -SR today -Continue beta blocker  Aortic valve stenosis -Moderate on last echo in 11/24 with EF 60-65% and mean gradient of 28 mmHg, AVA by VTI 1.3 cm2 -Reassess on echo   Transfer out of ICU today. [facility]iology to assume rounds.  Length of Stay: 1  FINCH, LINDSAY N, PA-C  10/22/2024, 9:04 AM  Advanced Heart Failure Team Pager [phone] (M-F; 7a - 5p)   Please visit [email or web address]: For overnight  coverage please call cardiology fellow first. If fellow not available call Shock/ECMO MD on call.  For ECMO / Mechanical Support (Impella, IABP, LVAD) issues call Shock / ECMO MD on call.    Patient seen and examined with the above-signed Advanced Practice Provider and/or Housestaff. I personally reviewed laboratory data, imaging studies and relevant notes. I independently examined the patient and formulated the important aspects of the plan. I have edited the note to reflect any of my changes or salient points. I have personally discussed the plan with the patient and/or family.  She is s/p attempted PCI for inferior STEMI last night -unable to get small OM branch open.  Feels ok today. CP resolved.   Echo EF 55-60% Moderate to severe AS  General:  Sitting up in bed. No resp difficulty HEENT: normal Neck: supple. no JVD.  Cor: Regular rate & rhythm. 3/6 AS s2 moderately reduced Lungs: clear Abdomen: obese  soft, nontender, nondistended.Good bowel sounds. Extremities: no cyanosis, clubbing, rash, edema Neuro: alert & orientedx3, cranial nerves grossly intact. moves all 4 extremities w/o difficulty. Affect pleasant  She is stable post inferior STEMI with unsuccessful PCI of small OM branch. Continue DAPT/statin. In looking at [facility], I am concerned for possible embolic occlusion of OM. No h/o AF. Would place Zio at d/c.   AS is moderate to severe on echo - will need eventual TAVR  Ok to go to floor with team C   Toribio Fuel, MD  2:17 PM  "

## 2024-10-22 NOTE — Progress Notes (Signed)
 CARDIAC REHAB PHASE I   Post MI education including restrictions, risk factors, exercise guidelines, antiplatelet therapy importance, MI booklet, NTG use, heart healthy diabetic diet, and CRP2 reviewed. All questions and concerns addressed. Will refer to AP for CRP2.   2:35-3:10 Isaiah JAYSON [place], RN BSN 10/22/2024 3:10 PM

## 2024-10-22 NOTE — Progress Notes (Signed)
" °  Echocardiogram 2D Echocardiogram has been performed.  Koleen KANDICE Popper, RDCS 10/22/2024, 10:07 AM "

## 2024-10-22 NOTE — Plan of Care (Signed)
" °  Problem: Activity: Goal: Risk for activity intolerance will decrease Outcome: Progressing   Problem: Coping: Goal: Level of anxiety will decrease Outcome: Progressing   Problem: Pain Managment: Goal: General experience of comfort will improve and/or be controlled Outcome: Progressing   Problem: Cardiac: Goal: Ability to achieve and maintain adequate cardiovascular perfusion will improve Outcome: Progressing   "

## 2024-10-22 NOTE — Telephone Encounter (Addendum)
 Patient Product/process Development Scientist completed.    The patient is insured through [employer]. Patient has Medicare and is not eligible for a copay card, but may be able to apply for patient assistance or Medicare RX Payment Plan (Patient Must reach out to their plan, if eligible for payment plan), if available.    Ran test claim for Farxiga 10 mg and the current 30 day co-pay is $191.59 due to a deductible.  Ran test claim for Jardiance 10 mg and the current 30 day co-pay is $213.82 due to a deductible.  Ran test claim for ticagrelor  (Brilinta ) 90 mg and the current 30 day co-pay is $216.22 due to a deductible.   This test claim was processed through [facility]- copay amounts may vary at other pharmacies due to pharmacy/plan contracts, or as the patient moves through the different stages of their insurance plan.     Natasha Warren, CPHT Pharmacy Technician Patient Advocate Specialist Lead [facility] Patient Advocate Team Direct Number: [phone]  Fax: [phone]

## 2024-10-23 ENCOUNTER — Telehealth: Payer: Self-pay | Admitting: Cardiology

## 2024-10-23 ENCOUNTER — Other Ambulatory Visit (HOSPITAL_COMMUNITY): Payer: Self-pay

## 2024-10-23 ENCOUNTER — Other Ambulatory Visit: Payer: Self-pay | Admitting: Cardiology

## 2024-10-23 DIAGNOSIS — I255 Ischemic cardiomyopathy: Secondary | ICD-10-CM | POA: Insufficient documentation

## 2024-10-23 DIAGNOSIS — I350 Nonrheumatic aortic (valve) stenosis: Secondary | ICD-10-CM

## 2024-10-23 DIAGNOSIS — I8290 Acute embolism and thrombosis of unspecified vein: Secondary | ICD-10-CM

## 2024-10-23 DIAGNOSIS — E785 Hyperlipidemia, unspecified: Secondary | ICD-10-CM | POA: Insufficient documentation

## 2024-10-23 DIAGNOSIS — I10 Essential (primary) hypertension: Secondary | ICD-10-CM | POA: Insufficient documentation

## 2024-10-23 LAB — BASIC METABOLIC PANEL WITH GFR
Anion gap: 11 (ref 5–15)
BUN: 23 mg/dL (ref 8–23)
CO2: 26 mmol/L (ref 22–32)
Calcium: 9.6 mg/dL (ref 8.9–10.3)
Chloride: 102 mmol/L (ref 98–111)
Creatinine, Ser: 1.22 mg/dL — ABNORMAL HIGH (ref 0.44–1.00)
GFR, Estimated: 45 mL/min — ABNORMAL LOW
Glucose, Bld: 167 mg/dL — ABNORMAL HIGH (ref 70–99)
Potassium: 4.3 mmol/L (ref 3.5–5.1)
Sodium: 138 mmol/L (ref 135–145)

## 2024-10-23 LAB — GLUCOSE, CAPILLARY
Glucose-Capillary: 118 mg/dL — ABNORMAL HIGH (ref 70–99)
Glucose-Capillary: 84 mg/dL (ref 70–99)

## 2024-10-23 LAB — LIPOPROTEIN A (LPA): Lipoprotein (a): 34.7 nmol/L — ABNORMAL HIGH

## 2024-10-23 MED ORDER — LOSARTAN POTASSIUM 25 MG PO TABS
25.0000 mg | ORAL_TABLET | Freq: Every day | ORAL | 1 refills | Status: AC
  Filled 2024-10-23: qty 90, 90d supply, fill #0

## 2024-10-23 MED ORDER — NITROGLYCERIN 0.4 MG SL SUBL
0.4000 mg | SUBLINGUAL_TABLET | SUBLINGUAL | 2 refills | Status: AC | PRN
  Filled 2024-10-23: qty 25, 5d supply, fill #0

## 2024-10-23 MED ORDER — TICAGRELOR 90 MG PO TABS
90.0000 mg | ORAL_TABLET | Freq: Two times a day (BID) | ORAL | 2 refills | Status: AC
  Filled 2024-10-23: qty 60, 30d supply, fill #0

## 2024-10-23 MED ORDER — ATORVASTATIN CALCIUM 80 MG PO TABS
80.0000 mg | ORAL_TABLET | Freq: Every day | ORAL | 1 refills | Status: AC
  Filled 2024-10-23: qty 90, 90d supply, fill #0

## 2024-10-23 MED ORDER — LOSARTAN POTASSIUM 25 MG PO TABS: 25.0000 mg | ORAL_TABLET | Freq: Every day | ORAL | Status: DC

## 2024-10-23 NOTE — Discharge Summary (Addendum)
 " Discharge Summary   Patient ID: Natasha Warren MRN: [id number]; DOB: 07-Dec-1945  Admit date: 10/21/2024 Discharge date: 10/23/2024  PCP:  Trudy Vaughn FALCON, MD   [facility] Providers Cardiologist:  Alvan Carrier, MD     Discharge Diagnoses  Principal Problem:   STEMI involving left circumflex coronary artery [facility]) Active Problems:   ST elevation myocardial infarction involving left circumflex coronary artery [facility])   Ischemic cardiomyopathy   Hypertension   Hyperlipidemia   Aortic stenosis  Diagnostic Studies/Procedures   Cath: 10/21/2024  1.  Acute STEMI involving the left circumflex (OM 2 subbranch), total occlusion treated with PTCA.  Unsuccessful due to inability to restore flow in the vessel. 2.  Patent left main, dominant AV circumflex, and nondominant RCA with mild irregularity 3.  Moderate segmental LV systolic dysfunction with akinesis of the inferolateral wall, LVEF estimated at 45%   Recommendations: DAPT with aspirin  and ticagrelor  x 12 months for medical therapy of STEMI.  No further options for revascularization.  Diagnostic Dominance: Left  Intervention   Echo: 10/22/2024  IMPRESSIONS     1. Left ventricular ejection fraction, by estimation, is 60 to 65%. The  left ventricle has normal function. The left ventricle has no regional  wall motion abnormalities. Left ventricular diastolic parameters were  normal.   2. Right ventricular systolic function is normal. The right ventricular  size is normal. There is normal pulmonary artery systolic pressure.   3. The mitral valve is normal in structure. No evidence of mitral valve  regurgitation. No evidence of mitral stenosis.   4. The aortic valve is calcified. Aortic valve regurgitation is mild to  moderate. Severe aortic valve stenosis. Aortic valve area, by VTI measures  0.85 cm. Aortic valve Vmax measures 3.74 m/s.   5. Aortic dilatation noted. There is borderline dilatation of the   ascending aorta, measuring 38 mm.   6. The inferior vena cava is normal in size with greater than 50%  respiratory variability, suggesting right atrial pressure of 3 mmHg.   FINDINGS   Left Ventricle: Left ventricular ejection fraction, by estimation, is 60  to 65%. The left ventricle has normal function. The left ventricle has no  regional wall motion abnormalities. Definity  contrast agent was given IV  to delineate the left ventricular   endocardial borders. The left ventricular internal cavity size was normal  in size. There is no left ventricular hypertrophy. Left ventricular  diastolic parameters were normal.   Right Ventricle: The right ventricular size is normal. No increase in  right ventricular wall thickness. Right ventricular systolic function is  normal. There is normal pulmonary artery systolic pressure. The tricuspid  regurgitant velocity is 2.57 m/s, and   with an assumed right atrial pressure of 3 mmHg, the estimated right  ventricular systolic pressure is 29.4 mmHg.   Left Atrium: Left atrial size was normal in size.   Right Atrium: Right atrial size was normal in size.   Pericardium: There is no evidence of pericardial effusion.   Mitral Valve: The mitral valve is normal in structure. No evidence of  mitral valve regurgitation. No evidence of mitral valve stenosis.   Tricuspid Valve: The tricuspid valve is normal in structure. Tricuspid  valve regurgitation is mild . No evidence of tricuspid stenosis.   Aortic Valve: The aortic valve is calcified. Aortic valve regurgitation is  mild to moderate. Aortic regurgitation PHT measures 134 msec. Severe  aortic stenosis is present. Aortic valve mean gradient measures 33.5 mmHg.  Aortic valve peak gradient measures   56.0 mmHg. Aortic valve area, by VTI measures 0.85 cm.   Pulmonic Valve: The pulmonic valve was normal in structure. Pulmonic valve  regurgitation is not visualized. No evidence of pulmonic stenosis.    Aorta: Aortic dilatation noted and the aortic root is normal in size and  structure. There is borderline dilatation of the ascending aorta,  measuring 38 mm.   Venous: The inferior vena cava is normal in size with greater than 50%  respiratory variability, suggesting right atrial pressure of 3 mmHg.   IAS/Shunts: No atrial level shunt detected by color flow Doppler  _____________   History of Present Illness   Natasha Warren is a 79 y.o. female with moderate aortic stenosis, HTN, HLD and SVT who was seen 10/21/2024 for the evaluation of STEMI. Natasha Warren was in her normal state of health the day of admission when she developed severe substernal chest pressure radiating to her teeth associated with acute diaphoresis. EMS was called and an EKG in the field was consistent with an acute inferior STEMI.  A code STEMI was called and the patient was brought directly to the cardiac catheterization lab.  She continued to have substernal chest discomfort at the time of assessment with Dr. Wonda. No shortness of breath or other complaints.  No recent illness.  No recent surgeries. Her last surgery was removal of hardware from her back last spring.    Hospital Course    Inferior STEMI -- Cardiac catheterization 2/3 with total occlusion of OM subbranch of left circumflex with unsuccessful PCI secondary to inability to restore flow to vessel.  Patent left main, dominant AV circumflex and nondominant RCA with mild irregularity.  Recommendations for DAPT with aspirin /Brilinta  for at least 1 year.  Catheter films were reviewed by Dr. Wonda and Dr. Arianne Klinge with question of possible embolic occlusion of OM.  She has no known history of atrial fibrillation therefore recommend ZIO placement at discharge. -- Seen by cardiac rehab -- Continue aspirin , Brilinta , atorvastatin  80 mg daily  Acute HFrEF ICM -- LV gram with moderate segmental LV systolic dysfunction with akinesis of the inferior lateral wall with  LVEF 45% -- Echo 2/4 with LVEF of 60 to 65%, no regional wall motion abnormality, normal RV -- GDMT: Metoprolol  XL 50 mg daily, losartan  25 mg daily  Aortic valve stenosis -- Previously noted as moderate, echocardiogram this admission with mild to moderate aortic regurgitation with moderate to severe aortic valve stenosis with aortic valve area of 0.85 cm, mean gradient 34 mmHg  -- likely outpatient TAVR referral in the near future   Hypertension -- continue Toprol  50mg  daily, losartan  25mg  daily   Hyperlipidemia -- LDL 58, HDL 46, LP(a) pending -- Continue atorvastatin  80 mg daily  DM -- Hgb A1c 5.8 -- resume home metformin  at discharge   Did the patient have an acute coronary syndrome (MI, NSTEMI, STEMI, etc) this admission?:  Yes                               AHA/ACC ACS Clinical Performance & Quality Measures: Aspirin  prescribed? - Yes ADP Receptor Inhibitor (Plavix/Clopidogrel, Brilinta /Ticagrelor  or Effient/Prasugrel) prescribed (includes medically managed patients)? - Yes Beta Blocker prescribed? - Yes High Intensity Statin (Lipitor 40-80mg  or Crestor 20-40mg ) prescribed? - Yes EF assessed during THIS hospitalization? - Yes For EF <40%, was ACEI/ARB prescribed? - Yes For EF <40%, Aldosterone Antagonist (Spironolactone or Eplerenone) prescribed? -  No - Reason:  EF improved on formal echo Cardiac Rehab Phase II ordered (including medically managed patients)? - Yes   The patient will be scheduled for a TOC follow up appointment in 10-14 days.  A message has been sent to the [facility] and Scheduling Pool at the office where the patient should be seen for follow up.  _____________  Discharge Vitals Blood pressure 104/63, pulse 75, temperature 97.6 F (36.4 C), temperature source Oral, resp. rate 16, height 5' 2 (1.575 m), weight 99.8 kg, SpO2 96%.  Filed Weights   10/22/24 1930  Weight: 99.8 kg    Labs & Radiologic Studies  CBC Recent Labs    10/21/24 2120  10/22/24 1812  WBC 9.5 8.1  HGB 15.4* 14.5  HCT 48.6* 44.7  MCV 91.7 90.9  PLT 233 229   Basic Metabolic Panel Recent Labs    97/95/73 2253 10/23/24 0227  NA 139 138  K 4.5 4.3  CL 102 102  CO2 26 26  GLUCOSE 113* 167*  BUN 25* 23  CREATININE 1.37* 1.22*  CALCIUM  9.9 9.6   Liver Function Tests Recent Labs    10/21/24 1920  AST 49*  ALT 17  ALKPHOS 105  BILITOT 0.5  PROT 7.0  ALBUMIN 4.0   No results for input(s): LIPASE, AMYLASE in the last 72 hours. High Sensitivity Troponin:   No results for input(s): TROPONINIHS in the last 720 hours.  No results for input(s): TRNPT in the last 720 hours.  BNP Invalid input(s): POCBNP No results for input(s): PROBNP in the last 72 hours.  No results for input(s): BNP in the last 72 hours.  D-Dimer No results for input(s): DDIMER in the last 72 hours. Hemoglobin A1C Recent Labs    10/21/24 1920  HGBA1C 5.8*   Fasting Lipid Panel Recent Labs    10/21/24 1920  CHOL 156  HDL 46  LDLCALC 58  TRIG 259*  CHOLHDL 3.4   No results found for: LIPOA  Thyroid  Function Tests No results for input(s): TSH, T4TOTAL, T3FREE, THYROIDAB in the last 72 hours.  Invalid input(s): FREET3 _____________  ECHOCARDIOGRAM COMPLETE Result Date: 10/22/2024    ECHOCARDIOGRAM REPORT   Patient Name:   STANLEY HELMUTH Date of Exam: 10/22/2024 Medical Rec #:  [id number]        Height:       62.0 in Accession #:    [id number]       Weight:       220.0 lb Date of Birth:  07-10-1946       BSA:          1.991 m Patient Age:    78 years         BP:           140/78 mmHg Patient Gender: F                HR:           67 bpm. Exam Location:  Inpatient Procedure: 2D Echo, Cardiac Doppler, Color Doppler and Intracardiac            Opacification Agent (Both Spectral and Color Flow Doppler were            utilized during procedure). Indications:    Acute Ischemic heart disease, unspecified I24.9  History:        Patient has prior  history of Echocardiogram examinations, most  recent 08/02/2023. STEMI, Aortic Valve Disease,                 Signs/Symptoms:Murmur; Risk Factors:Hypertension, Dyslipidemia                 and Diabetes.  Sonographer:    Koleen Popper RDCS Referring Phys: [phone] MICHAEL COOPER  Sonographer Comments: Technically difficult study due to poor echo windows. IMPRESSIONS  1. Left ventricular ejection fraction, by estimation, is 60 to 65%. The left ventricle has normal function. The left ventricle has no regional wall motion abnormalities. Left ventricular diastolic parameters were normal.  2. Right ventricular systolic function is normal. The right ventricular size is normal. There is normal pulmonary artery systolic pressure.  3. The mitral valve is normal in structure. No evidence of mitral valve regurgitation. No evidence of mitral stenosis.  4. The aortic valve is calcified. Aortic valve regurgitation is mild to moderate. Severe aortic valve stenosis. Aortic valve area, by VTI measures 0.85 cm. Aortic valve Vmax measures 3.74 m/s.  5. Aortic dilatation noted. There is borderline dilatation of the ascending aorta, measuring 38 mm.  6. The inferior vena cava is normal in size with greater than 50% respiratory variability, suggesting right atrial pressure of 3 mmHg. FINDINGS  Left Ventricle: Left ventricular ejection fraction, by estimation, is 60 to 65%. The left ventricle has normal function. The left ventricle has no regional wall motion abnormalities. Definity  contrast agent was given IV to delineate the left ventricular  endocardial borders. The left ventricular internal cavity size was normal in size. There is no left ventricular hypertrophy. Left ventricular diastolic parameters were normal. Right Ventricle: The right ventricular size is normal. No increase in right ventricular wall thickness. Right ventricular systolic function is normal. There is normal pulmonary artery systolic pressure. The  tricuspid regurgitant velocity is 2.57 m/s, and  with an assumed right atrial pressure of 3 mmHg, the estimated right ventricular systolic pressure is 29.4 mmHg. Left Atrium: Left atrial size was normal in size. Right Atrium: Right atrial size was normal in size. Pericardium: There is no evidence of pericardial effusion. Mitral Valve: The mitral valve is normal in structure. No evidence of mitral valve regurgitation. No evidence of mitral valve stenosis. Tricuspid Valve: The tricuspid valve is normal in structure. Tricuspid valve regurgitation is mild . No evidence of tricuspid stenosis. Aortic Valve: The aortic valve is calcified. Aortic valve regurgitation is mild to moderate. Aortic regurgitation PHT measures 134 msec. Severe aortic stenosis is present. Aortic valve mean gradient measures 33.5 mmHg. Aortic valve peak gradient measures  56.0 mmHg. Aortic valve area, by VTI measures 0.85 cm. Pulmonic Valve: The pulmonic valve was normal in structure. Pulmonic valve regurgitation is not visualized. No evidence of pulmonic stenosis. Aorta: Aortic dilatation noted and the aortic root is normal in size and structure. There is borderline dilatation of the ascending aorta, measuring 38 mm. Venous: The inferior vena cava is normal in size with greater than 50% respiratory variability, suggesting right atrial pressure of 3 mmHg. IAS/Shunts: No atrial level shunt detected by color flow Doppler.  LEFT VENTRICLE PLAX 2D LVIDd:         4.00 cm      Diastology LVIDs:         2.70 cm      LV e' medial:    6.39 cm/s LV PW:         1.20 cm      LV E/e' medial:  19.1 LV IVS:  1.50 cm      LV e' lateral:   8.45 cm/s LVOT diam:     1.80 cm      LV E/e' lateral: 14.4 LV SV:         73 LV SV Index:   37 LVOT Area:     2.54 cm  LV Volumes (MOD) LV vol d, MOD A4C: 124.0 ml LV vol s, MOD A4C: 51.2 ml LV SV MOD A4C:     124.0 ml RIGHT VENTRICLE             IVC RV Basal diam:  3.80 cm     IVC diam: 1.60 cm RV S prime:     13.10 cm/s  TAPSE (M-mode): 1.9 cm LEFT ATRIUM             Index        RIGHT ATRIUM           Index LA diam:        4.10 cm 2.06 cm/m   RA Area:     10.70 cm LA Vol (A2C):   47.8 ml 24.01 ml/m  RA Volume:   19.70 ml  9.90 ml/m LA Vol (A4C):   49.0 ml 24.61 ml/m LA Biplane Vol: 50.3 ml 25.27 ml/m  AORTIC VALVE AV Area (Vmax):    0.91 cm AV Area (Vmean):   0.85 cm AV Area (VTI):     0.85 cm AV Vmax:           374.00 cm/s AV Vmean:          272.000 cm/s AV VTI:            0.859 m AV Peak Grad:      56.0 mmHg AV Mean Grad:      33.5 mmHg LVOT Vmax:         134.00 cm/s LVOT Vmean:        91.200 cm/s LVOT VTI:          0.288 m LVOT/AV VTI ratio: 0.34 AI PHT:            134 msec  AORTA Ao Root diam: 2.90 cm Ao Asc diam:  3.80 cm MITRAL VALVE                TRICUSPID VALVE MV Area (PHT): 4.49 cm     TR Peak grad:   26.4 mmHg MV Decel Time: 169 msec     TR Mean grad:   19.0 mmHg MR Peak grad: 132.9 mmHg    TR Vmax:        257.00 cm/s MR Vmax:      576.50 cm/s   TR Vmean:       210.0 cm/s MV E velocity: 122.00 cm/s MV A velocity: 94.90 cm/s   SHUNTS MV E/A ratio:  1.29         Systemic VTI:  0.29 m                             Systemic Diam: 1.80 cm Kardie Tobb DO Electronically signed by Dub Huntsman DO Signature Date/Time: 10/22/2024/10:36:15 AM    Final    CARDIAC CATHETERIZATION Result Date: 10/21/2024 1.  Acute STEMI involving the left circumflex (OM 2 subbranch), total occlusion treated with PTCA.  Unsuccessful due to inability to restore flow in the vessel. 2.  Patent left main, dominant AV circumflex, and nondominant RCA with mild irregularity 3.  Moderate  segmental LV systolic dysfunction with akinesis of the inferolateral wall, LVEF estimated at 45% Recommendations: DAPT with aspirin  and ticagrelor  x 12 months for medical therapy of STEMI.  No further options for revascularization.    Disposition Pt is being discharged home today in good condition.  Follow-up Plans & Appointments  Discharge Instructions      Amb Referral to Cardiac Rehabilitation   Complete by: As directed    Diagnosis: STEMI   After initial evaluation and assessments completed: Virtual Based Care may be provided alone or in conjunction with Phase 2 Cardiac Rehab based on patient barriers.: Yes   Intensive Cardiac Rehabilitation (ICR) [facility] location only OR Traditional Cardiac Rehabilitation (TCR) *If criteria for ICR are not met will enroll in TCR [facility] only): Yes   Increase activity slowly   Complete by: As directed        Discharge Medications Allergies as of 10/23/2024   No Known Allergies      Medication List     TAKE these medications    allopurinol  300 MG tablet Commonly known as: ZYLOPRIM  Take 300 mg by mouth daily.   aspirin  EC 81 MG tablet Take 81 mg by mouth every evening. Swallow whole.   atorvastatin  80 MG tablet Commonly known as: LIPITOR Take 1 tablet (80 mg total) by mouth daily. Start taking on: October 24, 2024 What changed:  medication strength how much to take   gabapentin  300 MG capsule Commonly known as: NEURONTIN  Take 300 mg by mouth at bedtime.   levothyroxine  50 MCG tablet Commonly known as: SYNTHROID  Take 50 mcg by mouth daily before breakfast.   losartan  25 MG tablet Commonly known as: COZAAR  Take 1 tablet (25 mg total) by mouth at bedtime. What changed:  medication strength how much to take when to take this   metFORMIN  500 MG tablet Commonly known as: GLUCOPHAGE  Take 500 mg by mouth daily.   metoprolol  succinate 50 MG 24 hr tablet Commonly known as: TOPROL -XL Take 1 & 1/2 tablets daily.   nitroGLYCERIN  0.4 MG SL tablet Commonly known as: NITROSTAT  Place 1 tablet (0.4 mg total) under the tongue every 5 (five) minutes x 3 doses as needed for chest pain.   oxyCODONE -acetaminophen  5-325 MG tablet Commonly known as: PERCOCET/ROXICET Take 1 tablet by mouth every 6 (six) hours as needed for moderate pain (pain score 4-6).   polyethylene glycol 17 g packet Commonly  known as: MIRALAX  / GLYCOLAX  Take 17 g by mouth daily.   Potassium Citrate  15 MEQ (1620 MG) Tbcr Take 1 tablet by mouth 2 (two) times daily.   ticagrelor  90 MG Tabs tablet Commonly known as: BRILINTA  Take 1 tablet (90 mg total) by mouth 2 (two) times daily.   tiZANidine  2 MG tablet Commonly known as: ZANAFLEX  Take 1 tablet (2 mg total) by mouth every 8 (eight) hours as needed for muscle spasms.   trolamine salicylate 10 % cream Commonly known as: ASPERCREME Apply 1 Application topically as needed for muscle pain.   Vitamin D3 125 MCG (5000 UT) Caps Take 5,000 Units by mouth daily.   zinc  gluconate 50 MG tablet Take 50 mg by mouth daily.         Outstanding Labs/Studies  FLP/LFTs in 8 weeks  BMET at follow up  Cardiac monitor  Duration of Discharge Encounter: APP Time: 20 minutes   Signed, Manuelita Rummer, NP 10/23/2024, 12:17 PM    Patient seen and examined with the above-signed Advanced Practice Provider and/or Housestaff. I personally reviewed laboratory  data, imaging studies and relevant notes. I independently examined the patient and formulated the important aspects of the plan. I have edited the note to reflect any of my changes or salient points. I have personally discussed the plan with the patient and/or family.  Denies CP or SOB Has walked the unit  General:  Sitting up in bed. No resp difficulty HEENT: normal Neck: supple. no JVD.  Cor: Regular rate & rhythm. No rubs, gallops or murmurs. Lungs: clear Abdomen: soft, nontender, nondistended.Good bowel sounds. Extremities: no cyanosis, clubbing, rash, edema Neuro: alert & orientedx3, cranial nerves grossly intact. moves all 4 extremities w/o difficulty. Affect pleasant  She is doing well post-PCI. Ok for d/c today. Will arrange for outpatient Zio monitoring to surveil for AF given possibility of cardioembolic event. IIf montior normal can consider echo with bubble study. Continue DAPT/statin.   Total MD  time 38 mins  Toribio Fuel, MD  5:49 PM  "

## 2024-10-23 NOTE — Progress Notes (Signed)
 30d monitor ordered in the setting of thrombotic occlusion of OM

## 2024-10-23 NOTE — TOC Transition Note (Signed)
 Transition of Care [facility]) - Discharge Note   Patient Details  Name: Natasha Warren MRN: [id number] Date of Birth: 16-Jun-1946  [facility] CM/SW Contact:  Arlana JINNY Nicholaus ISRA[place] Phone Number: [phone] 10/23/2024, 12:42 PM   Clinical Narrative:   [facility] CSW called to schedule patients Hospital follow up appointment and left a VM.       Final next level of care: Other (comment) (To be determined) Barriers to Discharge: Continued Medical Work up   Patient Goals and CMS Choice Patient states their goals for this hospitalization and ongoing recovery are:: return home CMS Medicare.gov Compare Post Acute Care list provided to:: Patient Choice offered to / list presented to : Patient [place] ownership interest in [facility].provided to:: Patient    Discharge Placement                       Discharge Plan and Services Additional resources added to the After Visit Summary for                                       Social Drivers of Health (SDOH) Interventions SDOH Screenings   Tobacco Use: Low Risk (04/14/2024)     Readmission Risk Interventions     No data to display

## 2024-10-23 NOTE — Telephone Encounter (Signed)
"  ° °  Transition of Care Follow-up Phone Call Request    Patient Name: REGINE CHRISTIAN Date of Birth: [birth year] Date of Encounter: 10/23/2024  Primary Care Provider:  Trudy Vaughn FALCON, MD Primary Cardiologist:  Alvan Carrier, MD  Rudell GORMAN Charters has been scheduled for a transition of care follow up appointment with a [facility] provider:  Dena Kapur 2/12  Please reach out to Rudell GORMAN Charters within 48 hours of discharge to confirm appointment and review transition of care protocol questionnaire. Anticipated discharge date: 2/5  Manuelita Rummer, NP  10/23/2024, 11:39 AM    "

## 2024-10-23 NOTE — Progress Notes (Addendum)
 "    Advanced Heart Failure Rounding Note  Cardiologist: Alvan Carrier, MD   Chief Complaint: Acute inferior STEMI Patient Profile   Natasha Warren is a 79 y.o. female with history of aortic valve stenosis, SVT, multiple back surgeries, HTN, hypothyroidism, DM.   Significant events:   2/3: Inferior STEMI. Emergent cath with 100% OM2 s/p unsuccessful attempt at PTCA. No significant disease elsewhere. LVEF 45%. Inferolateral AK.  Subjective:    Feeling well. No complaints. Has mild tenderness at right radial site.   Objective:    99.8 kg Body mass index is 40.24 kg/m.   Vital Signs:   Temp:  [98.1 F (36.7 C)-98.7 F (37.1 C)] 98.4 F (36.9 C) (02/05 0700) Pulse Rate:  [64-93] 77 (02/05 0823) Resp:  [10-20] 18 (02/05 0823) BP: (99-146)/(48-92) 109/58 (02/05 0823) SpO2:  [92 %-98 %] 97 % (02/05 0823) Weight:  [99.8 kg] 99.8 kg (02/04 1930) Last BM Date : 10/22/24  Weight change: Filed Weights   10/22/24 1930  Weight: 99.8 kg    Intake/Output:   Intake/Output Summary (Last 24 hours) at 10/23/2024 0903 Last data filed at 10/23/2024 0800 Gross per 24 hour  Intake 600 ml  Output 1300 ml  Net -700 ml     Physical Exam   General: Lying in bed. No distress. Cor: No JVD. Regular rate & rhythm. 2/6 AS murmur Lungs: clear Abdomen: soft, nontender, nondistended. Extremities: no edema, R radial ecchymoses Neuro: alert & orientedx3. Affect pleasant   Telemetry   SR 60s-80s  Labs   CBC Recent Labs    10/21/24 2120 10/22/24 1812  WBC 9.5 8.1  HGB 15.4* 14.5  HCT 48.6* 44.7  MCV 91.7 90.9  PLT 233 229   Basic Metabolic Panel Recent Labs    97/95/73 2253 10/23/24 0227  NA 139 138  K 4.5 4.3  CL 102 102  CO2 26 26  GLUCOSE 113* 167*  BUN 25* 23  CREATININE 1.37* 1.22*  CALCIUM  9.9 9.6   Liver Function Tests Recent Labs    10/21/24 1920  AST 49*  ALT 17  ALKPHOS 105  BILITOT 0.5  PROT 7.0  ALBUMIN 4.0   No results for input(s):  LIPASE, AMYLASE in the last 72 hours. Cardiac Enzymes No results for input(s): CKTOTAL, CKMB, CKMBINDEX, TROPONINI in the last 72 hours.  BNP: BNP (last 3 results) No results for input(s): BNP in the last 8760 hours.  ProBNP (last 3 results) No results for input(s): PROBNP in the last 8760 hours.   D-Dimer No results for input(s): DDIMER in the last 72 hours. Hemoglobin A1C Recent Labs    10/21/24 1920  HGBA1C 5.8*   Fasting Lipid Panel Recent Labs    10/21/24 1920  CHOL 156  HDL 46  LDLCALC 58  TRIG 259*  CHOLHDL 3.4   Medications:   Scheduled Medications:  aspirin  EC  81 mg Oral Daily   atorvastatin   80 mg Oral Daily   Chlorhexidine  Gluconate Cloth  6 each Topical Daily   enoxaparin  (LOVENOX ) injection  40 mg Subcutaneous Q24H   gabapentin   300 mg Oral QHS   insulin  aspart  0-9 Units Subcutaneous TID WC   levothyroxine   50 mcg Oral Q0600   losartan   25 mg Oral QHS   metoprolol  succinate  50 mg Oral Daily   sodium chloride  flush  3 mL Intravenous Q12H   ticagrelor   90 mg Oral BID    Infusions:    PRN Medications: acetaminophen , morphine  injection,  nitroGLYCERIN , ondansetron  (ZOFRAN ) IV, mouth rinse, sodium chloride  flush  Assessment/Plan   Acute inferior STEMI/CAD -Emergent cath 10/21/24 with 100% OM2, unsuccessful PTCA -Stable without chest pain. Telemetry stable. -DAPT with ASA + ticagrelor  X 1 year -LDL 58, continue Atorvastatin  80 -Dr. Cherrie and Dr. Wonda reviewed her cath films. Some question if she may have had embolic occlusion of OM. No known hx of AF. Recommend Zio at discharge. If no afib, would consider an echo w/ bubble study at some point.  ICM -EF 45% in lab with inferolateral AK -Complete echo w/ preserved LV function -Continue toprol  xl 50 mg daily which she is on for SVT -Add back home losartan  for blood pressure and renal protection with known DM  Hx SVT -SR today -Continue beta blocker  Aortic valve  stenosis -Moderate to severe on echo this admit with EF 60-65%, AVA 0.85 cm2 by VTI, mean gradient 34 mmHg  -Follow closely, will eventually need to be considered for TAVR  HTN -Add back losartan , on 100 daily at home but will restart at 25 mg daily (do not think she cannot tolerate higher dose currently)  Okay for discharge today. Discussed with Team C who is planning to discharge.   Will need follow-up with Cardiology but does not need outpatient follow-up with Advanced Heart Failure.  Length of Stay: 2  FINCH, LINDSAY N, PA-C  10/23/2024, 9:03 AM  Advanced Heart Failure Team Pager [phone] (M-F; 7a - 5p)   Please visit [email or web address]: For overnight coverage please call cardiology fellow first. If fellow not available call Shock/ECMO MD on call.  For ECMO / Mechanical Support (Impella, IABP, LVAD) issues call Shock / ECMO MD on call.     Patient seen and examined with the above-signed Advanced Practice Provider and/or Housestaff. I personally reviewed laboratory data, imaging studies and relevant notes. I independently examined the patient and formulated the important aspects of the plan. I have edited the note to reflect any of my changes or salient points. I have personally discussed the plan with the patient and/or family.  Denies CP or SOB Has walked the unit  General:  Sitting up in bed. No resp difficulty HEENT: normal Neck: supple. no JVD.  Cor: Regular rate & rhythm. No rubs, gallops or murmurs. Lungs: clear Abdomen: soft, nontender, nondistended.Good bowel sounds. Extremities: no cyanosis, clubbing, rash, edema Neuro: alert & orientedx3, cranial nerves grossly intact. moves all 4 extremities w/o difficulty. Affect pleasant  She is doing well post-PCI. Ok for d/c today. Will arrange for outpatient Zio monitoring to surveil for AF given possibility of cardioembolic event. IIf montior normal can consider echo with bubble study. Continue DAPT/statin.   Total MD time 38  mins  Toribio Cherrie, MD  5:49 PM   "

## 2024-10-30 ENCOUNTER — Inpatient Hospital Stay: Admitting: Physician Assistant

## 2024-12-02 ENCOUNTER — Ambulatory Visit: Admitting: Urology
